# Patient Record
Sex: Female | Born: 1935
Health system: Southern US, Community
[De-identification: ages and names within clinical notes are randomized; demographics above are authoritative.]

## PROBLEM LIST (undated history)

## (undated) DIAGNOSIS — G8929 Other chronic pain: Secondary | ICD-10-CM

## (undated) DIAGNOSIS — R740 Nonspecific elevation of levels of transaminase and lactic acid dehydrogenase [LDH]: Secondary | ICD-10-CM

## (undated) DIAGNOSIS — K449 Diaphragmatic hernia without obstruction or gangrene: Secondary | ICD-10-CM

## (undated) DIAGNOSIS — I700 Atherosclerosis of aorta: Secondary | ICD-10-CM

## (undated) DIAGNOSIS — M1990 Unspecified osteoarthritis, unspecified site: Secondary | ICD-10-CM

## (undated) DIAGNOSIS — R918 Other nonspecific abnormal finding of lung field: Secondary | ICD-10-CM

## (undated) DIAGNOSIS — H409 Unspecified glaucoma: Secondary | ICD-10-CM

## (undated) DIAGNOSIS — R7401 Elevation of levels of liver transaminase levels: Secondary | ICD-10-CM

## (undated) DIAGNOSIS — I2510 Atherosclerotic heart disease of native coronary artery without angina pectoris: Secondary | ICD-10-CM

## (undated) DIAGNOSIS — M419 Scoliosis, unspecified: Secondary | ICD-10-CM

## (undated) DIAGNOSIS — N390 Urinary tract infection, site not specified: Secondary | ICD-10-CM

## (undated) DIAGNOSIS — I219 Acute myocardial infarction, unspecified: Secondary | ICD-10-CM

## (undated) DIAGNOSIS — K219 Gastro-esophageal reflux disease without esophagitis: Secondary | ICD-10-CM

## (undated) DIAGNOSIS — I5189 Other ill-defined heart diseases: Secondary | ICD-10-CM

## (undated) DIAGNOSIS — I10 Essential (primary) hypertension: Secondary | ICD-10-CM

## (undated) DIAGNOSIS — K5790 Diverticulosis of intestine, part unspecified, without perforation or abscess without bleeding: Secondary | ICD-10-CM

## (undated) HISTORY — DX: Other ill-defined heart diseases: I51.89

## (undated) HISTORY — PX: ABDOMINAL HYSTERECTOMY: SHX81

## (undated) HISTORY — PX: FRACTURE SURGERY: SHX138

## (undated) HISTORY — PX: EYE SURGERY: SHX253

---

## 2007-09-11 ENCOUNTER — Encounter: Admission: RE | Admit: 2007-09-11 | Discharge: 2007-11-07 | Payer: Self-pay | Admitting: Sports Medicine

## 2010-02-12 ENCOUNTER — Encounter: Payer: Self-pay | Admitting: Internal Medicine

## 2010-03-15 ENCOUNTER — Encounter: Payer: Self-pay | Admitting: Internal Medicine

## 2010-04-15 ENCOUNTER — Encounter: Payer: Self-pay | Admitting: Internal Medicine

## 2010-04-21 ENCOUNTER — Ambulatory Visit: Payer: Self-pay | Admitting: Internal Medicine

## 2014-05-27 ENCOUNTER — Inpatient Hospital Stay (HOSPITAL_COMMUNITY)
Admission: EM | Admit: 2014-05-27 | Discharge: 2014-06-01 | DRG: 758 | Disposition: A | Discharge status: Home Health | Payer: Medicare PPO | Attending: Family Medicine | Admitting: Family Medicine

## 2014-05-27 ENCOUNTER — Emergency Department (HOSPITAL_COMMUNITY): Payer: Medicare PPO

## 2014-05-27 ENCOUNTER — Encounter (HOSPITAL_COMMUNITY): Payer: Self-pay | Admitting: Emergency Medicine

## 2014-05-27 DIAGNOSIS — I10 Essential (primary) hypertension: Secondary | ICD-10-CM | POA: Diagnosis present

## 2014-05-27 DIAGNOSIS — K449 Diaphragmatic hernia without obstruction or gangrene: Secondary | ICD-10-CM | POA: Diagnosis present

## 2014-05-27 DIAGNOSIS — Z87891 Personal history of nicotine dependence: Secondary | ICD-10-CM | POA: Diagnosis not present

## 2014-05-27 DIAGNOSIS — Z8249 Family history of ischemic heart disease and other diseases of the circulatory system: Secondary | ICD-10-CM

## 2014-05-27 DIAGNOSIS — M419 Scoliosis, unspecified: Secondary | ICD-10-CM | POA: Diagnosis present

## 2014-05-27 DIAGNOSIS — N760 Acute vaginitis: Secondary | ICD-10-CM | POA: Diagnosis present

## 2014-05-27 DIAGNOSIS — K219 Gastro-esophageal reflux disease without esophagitis: Secondary | ICD-10-CM | POA: Diagnosis present

## 2014-05-27 DIAGNOSIS — Z880 Allergy status to penicillin: Secondary | ICD-10-CM

## 2014-05-27 DIAGNOSIS — R918 Other nonspecific abnormal finding of lung field: Secondary | ICD-10-CM | POA: Diagnosis present

## 2014-05-27 DIAGNOSIS — H409 Unspecified glaucoma: Secondary | ICD-10-CM | POA: Diagnosis present

## 2014-05-27 DIAGNOSIS — N730 Acute parametritis and pelvic cellulitis: Principal | ICD-10-CM | POA: Diagnosis present

## 2014-05-27 DIAGNOSIS — E860 Dehydration: Secondary | ICD-10-CM | POA: Diagnosis present

## 2014-05-27 DIAGNOSIS — M1990 Unspecified osteoarthritis, unspecified site: Secondary | ICD-10-CM | POA: Diagnosis present

## 2014-05-27 DIAGNOSIS — M810 Age-related osteoporosis without current pathological fracture: Secondary | ICD-10-CM | POA: Diagnosis present

## 2014-05-27 DIAGNOSIS — Z833 Family history of diabetes mellitus: Secondary | ICD-10-CM

## 2014-05-27 DIAGNOSIS — N390 Urinary tract infection, site not specified: Secondary | ICD-10-CM | POA: Diagnosis present

## 2014-05-27 DIAGNOSIS — T836XXA Infection and inflammatory reaction due to prosthetic device, implant and graft in genital tract, initial encounter: Secondary | ICD-10-CM

## 2014-05-27 DIAGNOSIS — Z800 Family history of malignant neoplasm of digestive organs: Secondary | ICD-10-CM | POA: Diagnosis not present

## 2014-05-27 DIAGNOSIS — Z79899 Other long term (current) drug therapy: Secondary | ICD-10-CM | POA: Diagnosis not present

## 2014-05-27 DIAGNOSIS — R197 Diarrhea, unspecified: Secondary | ICD-10-CM

## 2014-05-27 DIAGNOSIS — K529 Noninfective gastroenteritis and colitis, unspecified: Secondary | ICD-10-CM | POA: Diagnosis present

## 2014-05-27 DIAGNOSIS — F319 Bipolar disorder, unspecified: Secondary | ICD-10-CM | POA: Diagnosis present

## 2014-05-27 DIAGNOSIS — Z79891 Long term (current) use of opiate analgesic: Secondary | ICD-10-CM | POA: Diagnosis not present

## 2014-05-27 DIAGNOSIS — R112 Nausea with vomiting, unspecified: Secondary | ICD-10-CM | POA: Diagnosis present

## 2014-05-27 DIAGNOSIS — R52 Pain, unspecified: Secondary | ICD-10-CM

## 2014-05-27 DIAGNOSIS — T8369XA Infection and inflammatory reaction due to other prosthetic device, implant and graft in genital tract, initial encounter: Secondary | ICD-10-CM

## 2014-05-27 DIAGNOSIS — D72829 Elevated white blood cell count, unspecified: Secondary | ICD-10-CM | POA: Diagnosis present

## 2014-05-27 HISTORY — DX: Other nonspecific abnormal finding of lung field: R91.8

## 2014-05-27 HISTORY — DX: Unspecified glaucoma: H40.9

## 2014-05-27 HISTORY — DX: Gastro-esophageal reflux disease without esophagitis: K21.9

## 2014-05-27 HISTORY — DX: Unspecified osteoarthritis, unspecified site: M19.90

## 2014-05-27 HISTORY — DX: Atherosclerosis of aorta: I70.0

## 2014-05-27 HISTORY — DX: Diaphragmatic hernia without obstruction or gangrene: K44.9

## 2014-05-27 HISTORY — DX: Essential (primary) hypertension: I10

## 2014-05-27 HISTORY — DX: Scoliosis, unspecified: M41.9

## 2014-05-27 HISTORY — DX: Diverticulosis of intestine, part unspecified, without perforation or abscess without bleeding: K57.90

## 2014-05-27 LAB — CBC WITH DIFFERENTIAL/PLATELET
BASOS ABS: 0 10*3/uL (ref 0.0–0.1)
Basophils Relative: 0 % (ref 0–1)
EOS ABS: 0 10*3/uL (ref 0.0–0.7)
Eosinophils Relative: 0 % (ref 0–5)
HCT: 39.6 % (ref 36.0–46.0)
Hemoglobin: 12.5 g/dL (ref 12.0–15.0)
LYMPHS ABS: 0.8 10*3/uL (ref 0.7–4.0)
Lymphocytes Relative: 5 % — ABNORMAL LOW (ref 12–46)
MCH: 30.3 pg (ref 26.0–34.0)
MCHC: 31.6 g/dL (ref 30.0–36.0)
MCV: 96.1 fL (ref 78.0–100.0)
MONOS PCT: 4 % (ref 3–12)
Monocytes Absolute: 0.6 10*3/uL (ref 0.1–1.0)
Neutro Abs: 14.3 10*3/uL — ABNORMAL HIGH (ref 1.7–7.7)
Neutrophils Relative %: 91 % — ABNORMAL HIGH (ref 43–77)
Platelets: 357 10*3/uL (ref 150–400)
RBC: 4.12 MIL/uL (ref 3.87–5.11)
RDW: 13.5 % (ref 11.5–15.5)
WBC: 15.7 10*3/uL — AB (ref 4.0–10.5)

## 2014-05-27 LAB — COMPREHENSIVE METABOLIC PANEL
ALK PHOS: 73 U/L (ref 39–117)
ALT: 14 U/L (ref 0–35)
ANION GAP: 9 (ref 5–15)
AST: 19 U/L (ref 0–37)
Albumin: 3.4 g/dL — ABNORMAL LOW (ref 3.5–5.2)
BUN: 22 mg/dL (ref 6–23)
CHLORIDE: 107 mmol/L (ref 96–112)
CO2: 22 mmol/L (ref 19–32)
CREATININE: 0.8 mg/dL (ref 0.50–1.10)
Calcium: 8.7 mg/dL (ref 8.4–10.5)
GFR calc non Af Amer: 66 mL/min — ABNORMAL LOW (ref >90)
GFR, EST AFRICAN AMERICAN: 76 mL/min — AB (ref >90)
GLUCOSE: 136 mg/dL — AB (ref 70–99)
Potassium: 4.1 mmol/L (ref 3.5–5.1)
Sodium: 138 mmol/L (ref 135–145)
TOTAL PROTEIN: 6.5 g/dL (ref 6.0–8.3)
Total Bilirubin: 0.3 mg/dL (ref 0.3–1.2)

## 2014-05-27 LAB — URINE MICROSCOPIC-ADD ON

## 2014-05-27 LAB — URINALYSIS, ROUTINE W REFLEX MICROSCOPIC
Bilirubin Urine: NEGATIVE
Glucose, UA: NEGATIVE mg/dL
Ketones, ur: NEGATIVE mg/dL
Nitrite: NEGATIVE
PROTEIN: NEGATIVE mg/dL
Specific Gravity, Urine: 1.015 (ref 1.005–1.030)
UROBILINOGEN UA: 0.2 mg/dL (ref 0.0–1.0)
pH: 5.5 (ref 5.0–8.0)

## 2014-05-27 LAB — I-STAT CG4 LACTIC ACID, ED: Lactic Acid, Venous: 0.96 mmol/L (ref 0.5–2.0)

## 2014-05-27 MED ORDER — ONDANSETRON HCL 4 MG/2ML IJ SOLN
4.0000 mg | Freq: Three times a day (TID) | INTRAMUSCULAR | Status: AC | PRN
  Administered 2014-05-28: 4 mg via INTRAVENOUS
  Filled 2014-05-27: qty 2

## 2014-05-27 MED ORDER — ALPRAZOLAM 0.5 MG PO TABS
0.5000 mg | ORAL_TABLET | Freq: Two times a day (BID) | ORAL | Status: DC | PRN
  Administered 2014-05-28 – 2014-05-31 (×6): 0.5 mg via ORAL
  Filled 2014-05-27 (×6): qty 1

## 2014-05-27 MED ORDER — ACETAMINOPHEN 650 MG RE SUPP
650.0000 mg | Freq: Four times a day (QID) | RECTAL | Status: DC | PRN
Start: 2014-05-27 — End: 2014-06-01

## 2014-05-27 MED ORDER — HYDROMORPHONE HCL 1 MG/ML IJ SOLN
0.5000 mg | Freq: Once | INTRAMUSCULAR | Status: AC
  Administered 2014-05-27: 0.5 mg via INTRAVENOUS
  Filled 2014-05-27: qty 1

## 2014-05-27 MED ORDER — POLYVINYL ALCOHOL 1.4 % OP SOLN
1.0000 [drp] | Freq: Two times a day (BID) | OPHTHALMIC | Status: DC
  Administered 2014-05-27 – 2014-06-01 (×10): 1 [drp] via OPHTHALMIC
  Filled 2014-05-27: qty 15

## 2014-05-27 MED ORDER — MORPHINE SULFATE 4 MG/ML IJ SOLN
4.0000 mg | INTRAMUSCULAR | Status: DC | PRN
  Administered 2014-05-27 – 2014-05-29 (×7): 4 mg via INTRAVENOUS
  Administered 2014-05-29: 6 mg via INTRAVENOUS
  Administered 2014-05-29 – 2014-05-30 (×3): 4 mg via INTRAVENOUS
  Administered 2014-05-30: 6 mg via INTRAVENOUS
  Administered 2014-05-30 – 2014-06-01 (×6): 4 mg via INTRAVENOUS
  Filled 2014-05-27 (×9): qty 1
  Filled 2014-05-27: qty 2
  Filled 2014-05-27 (×5): qty 1
  Filled 2014-05-27: qty 2
  Filled 2014-05-27 (×2): qty 1

## 2014-05-27 MED ORDER — METRONIDAZOLE IN NACL 5-0.79 MG/ML-% IV SOLN
500.0000 mg | Freq: Three times a day (TID) | INTRAVENOUS | Status: DC
  Administered 2014-05-27 – 2014-05-29 (×5): 500 mg via INTRAVENOUS
  Filled 2014-05-27 (×6): qty 100

## 2014-05-27 MED ORDER — SODIUM CHLORIDE 0.9 % IV BOLUS (SEPSIS)
500.0000 mL | Freq: Once | INTRAVENOUS | Status: AC
  Administered 2014-05-27: 500 mL via INTRAVENOUS

## 2014-05-27 MED ORDER — PANTOPRAZOLE SODIUM 40 MG IV SOLR
40.0000 mg | INTRAVENOUS | Status: DC
  Administered 2014-05-27 – 2014-05-28 (×2): 40 mg via INTRAVENOUS
  Filled 2014-05-27 (×3): qty 40

## 2014-05-27 MED ORDER — CALCIUM CARBONATE-VITAMIN D 500-200 MG-UNIT PO TABS
1.0000 | ORAL_TABLET | Freq: Two times a day (BID) | ORAL | Status: DC
  Administered 2014-05-27 – 2014-06-01 (×10): 1 via ORAL
  Filled 2014-05-27 (×11): qty 1

## 2014-05-27 MED ORDER — ONDANSETRON HCL 4 MG/2ML IJ SOLN
4.0000 mg | Freq: Once | INTRAMUSCULAR | Status: AC
  Administered 2014-05-27: 4 mg via INTRAVENOUS
  Filled 2014-05-27: qty 2

## 2014-05-27 MED ORDER — PROMETHAZINE HCL 25 MG/ML IJ SOLN
12.5000 mg | INTRAMUSCULAR | Status: DC | PRN
  Administered 2014-05-28 – 2014-05-29 (×3): 12.5 mg via INTRAVENOUS
  Filled 2014-05-27 (×4): qty 1

## 2014-05-27 MED ORDER — CIPROFLOXACIN IN D5W 400 MG/200ML IV SOLN
400.0000 mg | Freq: Once | INTRAVENOUS | Status: AC
  Administered 2014-05-27: 400 mg via INTRAVENOUS
  Filled 2014-05-27: qty 200

## 2014-05-27 MED ORDER — LATANOPROST 0.005 % OP SOLN
1.0000 [drp] | Freq: Every day | OPHTHALMIC | Status: DC
  Administered 2014-05-27 – 2014-05-31 (×5): 1 [drp] via OPHTHALMIC
  Filled 2014-05-27: qty 2.5

## 2014-05-27 MED ORDER — SODIUM CHLORIDE 0.9 % IV SOLN
INTRAVENOUS | Status: DC
  Administered 2014-05-30 – 2014-05-31 (×2): via INTRAVENOUS

## 2014-05-27 MED ORDER — DOXYCYCLINE HYCLATE 100 MG IV SOLR
100.0000 mg | Freq: Two times a day (BID) | INTRAVENOUS | Status: DC
  Administered 2014-05-27 – 2014-05-29 (×4): 100 mg via INTRAVENOUS
  Filled 2014-05-27 (×4): qty 100

## 2014-05-27 MED ORDER — IOHEXOL 300 MG/ML  SOLN
50.0000 mL | Freq: Once | INTRAMUSCULAR | Status: AC | PRN
  Administered 2014-05-27: 50 mL via ORAL

## 2014-05-27 MED ORDER — IOHEXOL 300 MG/ML  SOLN
100.0000 mL | Freq: Once | INTRAMUSCULAR | Status: AC | PRN
  Administered 2014-05-27: 100 mL via INTRAVENOUS

## 2014-05-27 MED ORDER — POLYETHYL GLYCOL-PROPYL GLYCOL 0.4-0.3 % OP SOLN: 1.0000 [drp] | Freq: Two times a day (BID) | OPHTHALMIC | Status: DC

## 2014-05-27 MED ORDER — ACETAMINOPHEN 325 MG PO TABS: 650.0000 mg | ORAL_TABLET | Freq: Four times a day (QID) | ORAL | Status: DC | PRN

## 2014-05-27 MED ORDER — DORZOLAMIDE HCL 2 % OP SOLN
1.0000 [drp] | Freq: Two times a day (BID) | OPHTHALMIC | Status: DC
  Administered 2014-05-27 – 2014-06-01 (×10): 1 [drp] via OPHTHALMIC
  Filled 2014-05-27: qty 10

## 2014-05-27 MED ORDER — SODIUM CHLORIDE 0.9 % IV SOLN
INTRAVENOUS | Status: AC
  Administered 2014-05-27: 22:00:00 via INTRAVENOUS

## 2014-05-27 MED ORDER — CALCIUM CITRATE-VITAMIN D 250-100 MG-UNIT PO TABS: 1.0000 | ORAL_TABLET | Freq: Two times a day (BID) | ORAL | Status: DC

## 2014-05-27 MED ORDER — ENOXAPARIN SODIUM 40 MG/0.4ML ~~LOC~~ SOLN
40.0000 mg | SUBCUTANEOUS | Status: DC
  Administered 2014-05-27 – 2014-05-31 (×5): 40 mg via SUBCUTANEOUS
  Filled 2014-05-27 (×6): qty 0.4

## 2014-05-27 MED ORDER — PROMETHAZINE HCL 25 MG/ML IJ SOLN
12.5000 mg | Freq: Once | INTRAMUSCULAR | Status: AC
  Administered 2014-05-27: 12.5 mg via INTRAVENOUS
  Filled 2014-05-27: qty 1

## 2014-05-27 MED ORDER — CITALOPRAM HYDROBROMIDE 20 MG PO TABS
20.0000 mg | ORAL_TABLET | Freq: Every day | ORAL | Status: DC
  Administered 2014-05-28 – 2014-06-01 (×6): 20 mg via ORAL
  Filled 2014-05-27 (×5): qty 1

## 2014-05-27 MED ORDER — BRIMONIDINE TARTRATE 0.15 % OP SOLN
1.0000 [drp] | Freq: Two times a day (BID) | OPHTHALMIC | Status: DC
  Administered 2014-05-27 – 2014-06-01 (×10): 1 [drp] via OPHTHALMIC
  Filled 2014-05-27: qty 5

## 2014-05-27 NOTE — ED Notes (Signed)
Pt. Is unable to use the restroom at this time, but is aware that we need a urine specimen.  

## 2014-05-27 NOTE — ED Provider Notes (Signed)
CSN: [id number]     Arrival date & time 05/27/14  1120 History   First MD Initiated Contact with Patient 05/27/14 1150     Chief Complaint  Patient presents with  . Nausea  . Emesis     (Consider location/radiation/quality/duration/timing/severity/associated sxs/prior Treatment) Patient is a 81 y.o. female presenting with vomiting. The history is provided by the patient (the pt complains of diarhea and nausea for two days.  pt complains of weakness).  Emesis Severity:  Mild Timing:  Intermittent Quality:  Undigested food Able to tolerate:  Liquids Progression:  Unchanged Chronicity:  New Recent urination:  Decreased Relieved by:  Nothing Associated symptoms: abdominal pain and diarrhea   Associated symptoms: no headaches     Past Medical History  Diagnosis Date  . Hypertension   . Arthritis    Past Surgical History  Procedure Laterality Date  . Fracture surgery    . Abdominal hysterectomy    . Eye surgery     No family history on file. History  Substance Use Topics  . Smoking status: Never Smoker   . Smokeless tobacco: Not on file  . Alcohol Use: No   OB History    No data available     Review of Systems  Constitutional: Negative for appetite change and fatigue.  HENT: Negative for congestion, ear discharge and sinus pressure.   Eyes: Negative for discharge.  Respiratory: Negative for cough.   Cardiovascular: Negative for chest pain.  Gastrointestinal: Positive for nausea, vomiting, abdominal pain and diarrhea.  Genitourinary: Negative for frequency and hematuria.  Musculoskeletal: Negative for back pain.  Skin: Negative for rash.  Neurological: Negative for seizures and headaches.  Psychiatric/Behavioral: Negative for hallucinations.      Allergies  Penicillins  Home Medications   Prior to Admission medications   Medication Sig Start Date End Date Taking? Authorizing Provider  ALPRAZolam Prudy Feeler) 0.5 MG tablet Take 0.5 mg by mouth 2 (two) times  daily as needed for anxiety.   Yes Historical Provider, MD  bimatoprost (LUMIGAN) 0.03 % ophthalmic solution Place 1 drop into both eyes daily.   Yes Historical Provider, MD  brimonidine (ALPHAGAN P) 0.1 % SOLN Place 1 drop into both eyes 2 (two) times daily.   Yes Historical Provider, MD  calcium-vitamin D 250-100 MG-UNIT per tablet Take 1 tablet by mouth 2 (two) times daily.   Yes Historical Provider, MD  celecoxib (CELEBREX) 200 MG capsule Take 200 mg by mouth 2 (two) times daily.   Yes Historical Provider, MD  cetirizine (ZYRTEC) 10 MG tablet Take 10 mg by mouth daily.   Yes Historical Provider, MD  citalopram (CELEXA) 20 MG tablet Take 20 mg by mouth daily.   Yes Historical Provider, MD  docusate sodium (COLACE) 100 MG capsule Take 100 mg by mouth at bedtime.   Yes Historical Provider, MD  dorzolamide (TRUSOPT) 2 % ophthalmic solution Place 1 drop into both eyes 2 (two) times daily.   Yes Historical Provider, MD  lansoprazole (PREVACID) 30 MG capsule Take 30 mg by mouth daily at 12 noon.   Yes Historical Provider, MD  lisinopril (PRINIVIL,ZESTRIL) 20 MG tablet Take 20 mg by mouth daily.   Yes Historical Provider, MD  oxyCODONE (OXY IR/ROXICODONE) 5 MG immediate release tablet Take 5 mg by mouth 3 (three) times daily as needed (pain).   Yes Historical Provider, MD  Polyethyl Glycol-Propyl Glycol (SYSTANE OP) Place 1 drop into both eyes 2 (two) times daily.   Yes Historical Provider, MD  Vitamin  D, Cholecalciferol, 1000 UNITS TABS Take 1,000 Units by mouth daily.   Yes Historical Provider, MD   BP 104/54 mmHg  Pulse 81  Temp(Src) 98.7 F (37.1 C) (Oral)  Resp 17  SpO2 94% Physical Exam  Constitutional: She is oriented to person, place, and time. She appears well-developed.  HENT:  Head: Normocephalic.  Eyes: Conjunctivae and EOM are normal. No scleral icterus.  Neck: Neck supple. No thyromegaly present.  Cardiovascular: Normal rate and regular rhythm.  Exam reveals no gallop and no  friction rub.   No murmur heard. Pulmonary/Chest: No stridor. She has no wheezes. She has no rales. She exhibits no tenderness.  Abdominal: She exhibits no distension. There is tenderness. There is no rebound.  Musculoskeletal: Normal range of motion. She exhibits no edema.  Lymphadenopathy:    She has no cervical adenopathy.  Neurological: She is oriented to person, place, and time. She exhibits normal muscle tone. Coordination normal.  Skin: No rash noted. No erythema.  Psychiatric: She has a normal mood and affect. Her behavior is normal.    ED Course  Procedures (including critical care time) Labs Review Labs Reviewed  CBC WITH DIFFERENTIAL/PLATELET - Abnormal; Notable for the following:    WBC 15.7 (*)    Neutrophils Relative % 91 (*)    Neutro Abs 14.3 (*)    Lymphocytes Relative 5 (*)    All other components within normal limits  COMPREHENSIVE METABOLIC PANEL - Abnormal; Notable for the following:    Glucose, Bld 136 (*)    Albumin 3.4 (*)    GFR calc non Af Amer 66 (*)    GFR calc Af Amer 76 (*)    All other components within normal limits  URINALYSIS, ROUTINE W REFLEX MICROSCOPIC - Abnormal; Notable for the following:    APPearance CLOUDY (*)    Hgb urine dipstick SMALL (*)    Leukocytes, UA LARGE (*)    All other components within normal limits  URINE MICROSCOPIC-ADD ON - Abnormal; Notable for the following:    Squamous Epithelial / LPF FEW (*)    Bacteria, UA MANY (*)    All other components within normal limits  CLOSTRIDIUM DIFFICILE BY PCR  URINE CULTURE    Imaging Review No results found.   EKG Interpretation None      MDM   Final diagnoses:  Pain  Colitis        Bethann Berkshire, MD 05/28/14 [id number]

## 2014-05-27 NOTE — ED Notes (Signed)
[facility] cancelled 

## 2014-05-27 NOTE — ED Notes (Signed)
Pt had bed at [facility], MD Rama paged to verify placement, MD Rama wants pt to stay at [facility].

## 2014-05-27 NOTE — ED Provider Notes (Signed)
D/w hospitalist Dr. Darnelle Catalan who will admit  Alexis Hong, MD 05/27/14 [phone]

## 2014-05-27 NOTE — ED Notes (Signed)
Bed: WA02 Expected date:  Expected time:  Means of arrival:  Comments: EMS 

## 2014-05-27 NOTE — ED Notes (Addendum)
Per EMS, Pt from home c/o N/V x 2 days. Denies abdominal pain. A&Ox4. Pt ambulatory with assistance. Pt received 950 mL NS and 4 mg zofran en route.

## 2014-05-27 NOTE — H&P (Addendum)
History and Physical:    Alexis Yates:[id number] DOB: 03/24/1927 DOA: 05/27/2014  Referring physician: Bethann Berkshire, MD PCP: Pearla Dubonnet, MD  Urologist: Tobie Lords  Chief Complaint: Nausea/vomiting  History of Present Illness:   Alexis Yates is an 81 y.o. female with HTN, OA on chronic pain medication who presents with a 2-3 day history of nausea and vomiting as well as diarrhea accompanied by diffuse abdominal aching pain.  Had a Z pack in December and February.  The patient has a pessary and reports that she gets in changed twice a year, and that she has had a foul smelling vaginal discharge, and has had infections in the past.  She sees Dr. Sabino Gasser in [facility] for pessary changes, and reports that she is "due" to have this done.  She specifically denies any dysuria, hematuria or urinary symptoms.  The EDP felt she had a UTI due to pyuria and bacteria on microscopy, but she has frank purulent vaginal drainage, so suspect this is a contaminant.    ROS:   Constitutional: No fever, + chills;  Appetite diminished; No weight loss, no weight gain, + fatigue.  HEENT: No blurry vision, no diplopia, no pharyngitis, no dysphagia CV: No chest pain, no palpitations, no PND, no orthopnea, no edema.  Resp: No SOB, no cough, no pleuritic pain. GI: + nausea, + vomiting, +diarrhea, no melena, no hematochezia, no constipation, + abdominal pain.  GU: No dysuria, no hematuria, no frequency, no urgency. MSK: no myalgias, + chronic arthralgias.  Neuro:  No headache, no focal neurological deficits, no history of seizures.  Psych: No depression, no anxiety.  Endo: No heat intolerance, no cold intolerance, no polyuria, no polydipsia  Skin: No rashes, no skin lesions.  Heme: + easy bruising.  Travel history: No recent travel.   Past Medical History:   Past Medical History  Diagnosis Date  . Hypertension   . Arthritis   . Hiatal hernia   . Diverticulosis   . Lung nodules     Right    . Atherosclerosis of aorta   . Scoliosis   . Glaucoma   . GERD (gastroesophageal reflux disease)     Past Surgical History:   Past Surgical History  Procedure Laterality Date  . Fracture surgery      Tibia/fibula of right leg  . Abdominal hysterectomy    . Eye surgery      Social History:   History   Social History  . Marital Status: Widowed    Spouse Name: N/A  . Number of Children: 2  . Years of Education: N/A   Occupational History  . Retired from school system    Social History Main Topics  . Smoking status: Former Games developer  . Smokeless tobacco: Not on file  . Alcohol Use: No  . Drug Use: No  . Sexual Activity: Not on file   Other Topics Concern  . Not on file   Social History Narrative   Widowed.  Lives alone.  Ambulates with a cane.    Family history:   Family History  Problem Relation Age of Onset  . Cancer Brother     Stomach  . Heart disease Father     Died age 6  . Diabetes Mother   . Heart failure Mother     Died 48    Allergies   Penicillins  Current Medications:   Prior to Admission medications   Medication Sig Start Date End Date Taking? Authorizing  Provider  ALPRAZolam Prudy Feeler) 0.5 MG tablet Take 0.5 mg by mouth 2 (two) times daily as needed for anxiety.   Yes Historical Provider, MD  bimatoprost (LUMIGAN) 0.03 % ophthalmic solution Place 1 drop into both eyes daily.   Yes Historical Provider, MD  brimonidine (ALPHAGAN P) 0.1 % SOLN Place 1 drop into both eyes 2 (two) times daily.   Yes Historical Provider, MD  calcium-vitamin D 250-100 MG-UNIT per tablet Take 1 tablet by mouth 2 (two) times daily.   Yes Historical Provider, MD  celecoxib (CELEBREX) 200 MG capsule Take 200 mg by mouth 2 (two) times daily.   Yes Historical Provider, MD  cetirizine (ZYRTEC) 10 MG tablet Take 10 mg by mouth daily.   Yes Historical Provider, MD  citalopram (CELEXA) 20 MG tablet Take 20 mg by mouth daily.   Yes Historical Provider, MD  docusate sodium  (COLACE) 100 MG capsule Take 100 mg by mouth at bedtime.   Yes Historical Provider, MD  dorzolamide (TRUSOPT) 2 % ophthalmic solution Place 1 drop into both eyes 2 (two) times daily.   Yes Historical Provider, MD  lansoprazole (PREVACID) 30 MG capsule Take 30 mg by mouth daily at 12 noon.   Yes Historical Provider, MD  lisinopril (PRINIVIL,ZESTRIL) 20 MG tablet Take 20 mg by mouth daily.   Yes Historical Provider, MD  oxyCODONE (OXY IR/ROXICODONE) 5 MG immediate release tablet Take 5 mg by mouth 3 (three) times daily as needed (pain).   Yes Historical Provider, MD  Polyethyl Glycol-Propyl Glycol (SYSTANE OP) Place 1 drop into both eyes 2 (two) times daily.   Yes Historical Provider, MD  Vitamin D, Cholecalciferol, 1000 UNITS TABS Take 1,000 Units by mouth daily.   Yes Historical Provider, MD    Physical Exam:   Filed Vitals:   05/27/14 1129 05/27/14 1341 05/27/14 1625 05/27/14 1800  BP: 135/77 104/54 93/60 119/64  Pulse: 97 81 77 84  Temp: 98.7 F (37.1 C)  98.4 F (36.9 C)   TempSrc: Oral  Oral   Resp: 15 17 16 15   SpO2: 100% 94% 93% 96%     Physical Exam: Blood pressure 119/64, pulse 84, temperature 98.4 F (36.9 C), temperature source Oral, resp. rate 15, SpO2 96 %. Gen: No acute distress. Head: Normocephalic, atraumatic. Eyes: PERRL, EOMI, sclerae nonicteric. Mouth: Oropharynx clear.  Dry mucous membranes. Neck: Supple, no thyromegaly, no lymphadenopathy, no jugular venous distention. Chest: Lungs CTAB. CV: Heart sounds tachycardic with II/VI SEM. Abdomen: Soft, tender lower abdomen, nondistended with normal active bowel sounds. GU: Vaginal atrophy, foul smelling purulent vaginal drainage. Extremities: Extremities without C/E/C. Skin: Warm and dry. Neuro: Alert and oriented times 3; cranial nerves II through XII grossly intact. Psych: Mood and affect normal.   Data Review:    Labs: Basic Metabolic Panel:  Recent Labs Lab 05/27/14 1210  NA 138  K 4.1  CL 107    CO2 22  GLUCOSE 136*  BUN 22  CREATININE 0.80  CALCIUM 8.7   Liver Function Tests:  Recent Labs Lab 05/27/14 1210  AST 19  ALT 14  ALKPHOS 73  BILITOT 0.3  PROT 6.5  ALBUMIN 3.4*   CBC:  Recent Labs Lab 05/27/14 1210  WBC 15.7*  NEUTROABS 14.3*  HGB 12.5  HCT 39.6  MCV 96.1  PLT 357   Urinalysis    Component Value Date/Time   COLORURINE YELLOW 05/27/2014 1122   APPEARANCEUR CLOUDY* 05/27/2014 1122   LABSPEC 1.015 05/27/2014 1122   PHURINE 5.5 05/27/2014  1122   GLUCOSEU NEGATIVE 05/27/2014 1122   HGBUR SMALL* 05/27/2014 1122   BILIRUBINUR NEGATIVE 05/27/2014 1122   KETONESUR NEGATIVE 05/27/2014 1122   PROTEINUR NEGATIVE 05/27/2014 1122   UROBILINOGEN 0.2 05/27/2014 1122   NITRITE NEGATIVE 05/27/2014 1122   LEUKOCYTESUR LARGE* 05/27/2014 1122   Radiographic Studies: Ct Abdomen Pelvis W Contrast  05/27/2014   CLINICAL DATA:  Nausea and vomiting for 2 days.  EXAM: CT ABDOMEN AND PELVIS WITH CONTRAST  TECHNIQUE: Multidetector CT imaging of the abdomen and pelvis was performed using the standard protocol following bolus administration of intravenous contrast.  CONTRAST:  OMNIPAQUE IOHEXOL 300 MG/ML SOLN, 50mL OMNIPAQUE IOHEXOL 300 MG/ML SOLN  COMPARISON:  None  FINDINGS: There is a 4 mm subpleural nodule in the basilar right lower lobe (series 6, image 7). There is a 3 mm ground-glass attenuation nodule in the right middle lobe (series 6, image 4). Minimal subsegmental atelectasis is present in the lung bases. There is no pleural effusion. Coronary artery calcification is partially visualized. Mitral annular calcification is also noted.  The liver, gallbladder, spleen, adrenal glands, and kidneys have an unremarkable enhanced appearance. There is mild intrahepatic and extrahepatic biliary dilatation,, with the common bile duct up to 10 mm in diameter. There is also moderate dilatation of the pancreatic duct, which measures up to 9 mm in the pancreatic head. No  definite obstructing mass is identified in this region.  There is a small sliding hiatal hernia. Oral contrast is present in loops of nondilated small and large bowel to the level of the rectum without evidence of obstruction. There is diffuse diverticulosis of the descending and sigmoid colon. The appearance of mild wall thickening involving the sigmoid colon is favored to be secondary to underdistention, without pericolonic inflammatory changes seen to definitely indicate acute diverticulitis. The appendix is identified in the right lower quadrant and is unremarkable.  There is advanced aortoiliac atherosclerotic calcification. No free fluid or enlarged lymph nodes are identified. The uterus is absent. Pelvic floor prolapse is noted with a pessary in place. Calcified granulomata are noted in the gluteal regions. Thoracolumbar dextroscoliosis is partially visualized. Severe right-sided disc space narrowing is present at L4-5.  IMPRESSION: 1. No definite acute abnormality identified in the abdomen or pelvis. 2. Small sliding hiatal hernia. 3. Mild biliary and moderate pancreatic ductal dilatation. No definite mass is identified on this study, however a small obstructing ampullary mass or stricture is possible. Consider ERCP or MRCP for further evaluation. 4. Diverticulosis without evidence of diverticulitis. 5. Small right lung nodules. If the patient is at high risk for bronchogenic carcinoma, follow-up chest CT at 1 year is recommended. If the patient is at low risk, no follow-up is needed. This recommendation follows the consensus statement: Guidelines for Management of Small Pulmonary Nodules Detected on CT Scans: A Statement from the Fleischner Society as published in Radiology 2005; 237:395-400.   Electronically Signed   By: Sebastian Ache   On: 05/27/2014 16:23     Assessment/Plan:   Principal Problem:     Vaginal inflammation from pessary R/O PID (acute pelvic inflammatory disease) / Leukocytosis  -  Suspect abnormalities on urine microscopy are from contamination with vaginal secretions, which are clearly purulent. - D/C Cipro.  Start doxycycline and Flagyl to cover PID. - Patient requests Dr. Marlou Porch be consulted (not on call), will call in a.m.  Pessary will need to be removed. - Anti-emetics/pain medications PRN. - F/U urine culture. - Lactic acid WNL.  Active Problems:  Nausea, vomiting and diarrhea - Anti-emetics PRN. - CL diet. - R/O C. Diff given recent treatment with Azithromycin.    Hypertension - Hold Prinvil.    Hiatal hernia /  GERD (gastroesophageal reflux disease) - PPI ordered IV.    Lung nodules - F/U CT chest in 1 year.  Patient counseled about need for follow up given smoking history (quit in 1983).    Glaucoma - Continue home medications.    DVT prophylaxis - Lovenox ordered.  Code Status: Full. Family Communication: Salina April ([phone]) or Jonetta Osgood [phone]), daughters. Disposition Plan: Home when stable.  Time spent: 1 hour.  RAMA,CHRISTINA [facility] Pager [phone] Cell: [phone]   If 7PM-7AM, please contact night-coverage [email or web address] Password [email or web address] 05/27/2014, 6:34 PM

## 2014-05-27 NOTE — ED Notes (Signed)
[facility] called  

## 2014-05-27 NOTE — ED Notes (Signed)
Attempted to call report, RN unavailable at this time. Floor RN to call back. 

## 2014-05-27 NOTE — Progress Notes (Signed)
EDCM spoke to patient and her family at bedside.  Patient's family member confirm patient's pcp is Dr. Johnella Moloney of [facility] [place].  System updated.

## 2014-05-28 DIAGNOSIS — I10 Essential (primary) hypertension: Secondary | ICD-10-CM

## 2014-05-28 DIAGNOSIS — N390 Urinary tract infection, site not specified: Secondary | ICD-10-CM

## 2014-05-28 DIAGNOSIS — K449 Diaphragmatic hernia without obstruction or gangrene: Secondary | ICD-10-CM

## 2014-05-28 DIAGNOSIS — H409 Unspecified glaucoma: Secondary | ICD-10-CM

## 2014-05-28 LAB — CBC
HEMATOCRIT: 35.8 % — AB (ref 36.0–46.0)
Hemoglobin: 11.3 g/dL — ABNORMAL LOW (ref 12.0–15.0)
MCH: 30.4 pg (ref 26.0–34.0)
MCHC: 31.6 g/dL (ref 30.0–36.0)
MCV: 96.2 fL (ref 78.0–100.0)
Platelets: 340 10*3/uL (ref 150–400)
RBC: 3.72 MIL/uL — ABNORMAL LOW (ref 3.87–5.11)
RDW: 13.9 % (ref 11.5–15.5)
WBC: 8.1 10*3/uL (ref 4.0–10.5)

## 2014-05-28 LAB — BASIC METABOLIC PANEL
Anion gap: 7 (ref 5–15)
BUN: 15 mg/dL (ref 6–23)
CALCIUM: 8.5 mg/dL (ref 8.4–10.5)
CHLORIDE: 109 mmol/L (ref 96–112)
CO2: 22 mmol/L (ref 19–32)
Creatinine, Ser: 0.86 mg/dL (ref 0.50–1.10)
GFR calc Af Amer: 70 mL/min — ABNORMAL LOW (ref >90)
GFR calc non Af Amer: 60 mL/min — ABNORMAL LOW (ref >90)
Glucose, Bld: 110 mg/dL — ABNORMAL HIGH (ref 70–99)
Potassium: 4.1 mmol/L (ref 3.5–5.1)
SODIUM: 138 mmol/L (ref 135–145)

## 2014-05-28 LAB — CLOSTRIDIUM DIFFICILE BY PCR: Toxigenic C. Difficile by PCR: NEGATIVE

## 2014-05-28 MED ORDER — LACTINEX PO CHEW
1.0000 | CHEWABLE_TABLET | Freq: Three times a day (TID) | ORAL | Status: DC
  Administered 2014-05-29 – 2014-06-01 (×10): 1 via ORAL
  Filled 2014-05-28 (×17): qty 1

## 2014-05-28 MED ORDER — SIMETHICONE 80 MG PO CHEW
160.0000 mg | CHEWABLE_TABLET | Freq: Four times a day (QID) | ORAL | Status: DC | PRN
  Administered 2014-05-28: 160 mg via ORAL
  Filled 2014-05-28 (×5): qty 2

## 2014-05-28 MED ORDER — LOPERAMIDE HCL 2 MG PO CAPS
2.0000 mg | ORAL_CAPSULE | ORAL | Status: DC | PRN
  Administered 2014-05-28 (×2): 2 mg via ORAL
  Filled 2014-05-28 (×3): qty 1

## 2014-05-28 MED ORDER — SACCHAROMYCES BOULARDII 250 MG PO CAPS
250.0000 mg | ORAL_CAPSULE | Freq: Two times a day (BID) | ORAL | Status: DC
  Filled 2014-05-28: qty 1

## 2014-05-28 NOTE — Progress Notes (Signed)
CARE MANAGEMENT NOTE 05/28/2014  Patient:  Alexis Yates, Alexis Yates   Account Number:  [id number]  Date Initiated:  05/28/2014  Documentation initiated by:  Ferdinand Cava  Subjective/Objective Assessment:   81 yo female admitted with acute pelvic inflammatory disease from home     Action/Plan:   discharge planning   Anticipated DC Date:  05/31/2014   Anticipated DC Plan:    In-house referral  Clinical Social Worker      DC Planning Services  CM consult      Choice offered to / List presented to:             Status of service:  In process, will continue to follow Medicare Important Message given?   (If response is "NO", the following Medicare IM given date fields will be blank) Date Medicare IM given:   Medicare IM given by:   Date Additional Medicare IM given:   Additional Medicare IM given by:    Discharge Disposition:    Per UR Regulation:    If discussed at Long Length of Stay Meetings, dates discussed:    Comments:  05/28/14 Ferdinand Cava RN BSN CM [phone] Spoke with patient and daughter in the room. Patient lives home alone and has a walker, cane, shower chair, elevated toilet seat, with the support of her daughter. The patient stated that she has not HH services in the past but she has been to 2 different facilities in the past for rehab, 1 in [place], and also [facility]. The patient stated that she is interested in rehab if recommended. The patient and daughter state that the patient will not go to [facility] and wants to remain in the [place] area. Will continue to follow on PT's recommendations, CSW made aware.

## 2014-05-28 NOTE — Progress Notes (Addendum)
[facility]                                                                              Patient Demographics  Alexis Yates, is a 81 y.o. female, DOB - 11-07-1927, YHO:[id number]  Admit date - 05/27/2014   Admitting Physician Maryruth Bun Rama, MD  Outpatient Primary MD for the patient is Pearla Dubonnet, MD  LOS - 1   Chief Complaint  Patient presents with  . Nausea  . Emesis      Interim history 81 year old female with history of hypertension, chronic pain, presented to the emergency department with complaints of nausea and vomiting with abdominal pain and diarrhea. Patient states she has had a history of pessary which she has changed twice a year however since having the pessary she has had vaginal discharge which has been smelly. Patient was admitted with vaginal inflammation from the pessary to rule out PID, was placed on doxycycline as well as Flagyl. Urology called for further evaluation and intervention.  Assessment & Plan   Vaginal Discharge/Inflammation -patient has a pessary in place -Urology consulted and appreciated, pending consult -Continue flagyl and doxycycline (patient has PCN allergy anaphylaxis, would also avoid cephalosporins) -CT abdomen/pelvis: No definite acute abnormality, small hiatal hernia, pancreatic ductal dilatation, no definite mass, diverticulosis, small right lung nodules -Unlikely PID as patient has no abscess noted on CT scan  UTI -UA: WBC TNTC, large leukocytes and many bacteria -Likely complicated by pessary -Urine culture pending  Abdominal pain with diarrhea and nausea/vomiting -Possibly secondary to the above -Continue anti-emetics and pain control as needed -C. difficile was negative -Will start patient on Imodium as needed -Patient does feel somewhat hungry, will advance her diet as tolerated.  Leukocytosis -Secondary to the above, resolved -Continue to monitor CBC  Essential Hypertension -Stable, Lisinopril  currently held  Lung nodules -Noted on CT scan -Patient quit smoking in 1983 -Patient will need to follow-up for repeat CT chest in one year  Glaucoma -Continue home regimen and eyedrops  Hiatal hernia/GERD -Continue PPI  Obstructing ampullary mass or stricture -Noted on CT of the abdomen and pelvis, no definite mass identified -Mild biliary and moderate pancreatic ductal dilatation -LFTs, bilirubin normal -If abdominal pain persists, would consider ERCP or MRCP for further evaluation vs outpatient work up   Code Status: Full  Family Communication: Daughter at bedside  Disposition Plan: Admitted, pending urology evaluation  Time Spent in minutes   30 minutes  Procedures  None  Consults   Urology  DVT Prophylaxis  Lovenox  Lab Results  Component Value Date   PLT 340 05/28/2014    Medications  Scheduled Meds: . brimonidine  1 drop Both Eyes BID  . calcium-vitamin D  1 tablet Oral BID  . citalopram  20 mg Oral Daily  . dorzolamide  1 drop Both Eyes BID  . doxycycline (VIBRAMYCIN) IV  100 mg Intravenous Q12H  . enoxaparin (LOVENOX) injection  40 mg Subcutaneous Q24H  . latanoprost  1 drop Both Eyes QHS  . metronidazole  500 mg Intravenous Q8H  . pantoprazole (PROTONIX) IV  40 mg Intravenous Q24H  . polyvinyl alcohol  1 drop Both Eyes BID   Continuous  Infusions: . sodium chloride     PRN Meds:.acetaminophen **OR** acetaminophen, ALPRAZolam, morphine injection, promethazine, simethicone  Antibiotics    Anti-infectives    Start     Dose/Rate Route Frequency Ordered Stop   05/27/14 1845  doxycycline (VIBRAMYCIN) 100 mg in dextrose 5 % 250 mL IVPB     100 mg 125 mL/hr over 120 Minutes Intravenous Every 12 hours 05/27/14 1830     05/27/14 1830  metroNIDAZOLE (FLAGYL) IVPB 500 mg     500 mg 100 mL/hr over 60 Minutes Intravenous Every 8 hours 05/27/14 1820     05/27/14 1600  ciprofloxacin (CIPRO) IVPB 400 mg     400 mg 200 mL/hr over 60 Minutes Intravenous   Once 05/27/14 1545 05/27/14 1837        Subjective:   Alexis Yates seen and examined today.  Patient continues to complain of abdominal pain with diarrhea. Feels that her nausea has improved slightly and is ready TE. Patient states that she has had no chest pain or shortness of breath. She does have some abdominal soreness especially with movement.   Objective:   Filed Vitals:   05/27/14 1800 05/27/14 1959 05/27/14 2121 05/28/14 0532  BP: 119/64 107/55 113/65 110/66  Pulse: 84 94 85 79  Temp:   98.1 F (36.7 C) 97.4 F (36.3 C)  TempSrc:   Oral Oral  Resp: Height:   5' (1.524 m)   Weight:   52.844 kg (116 lb 8 oz)   SpO2: 96% 92% 96% 95%    Wt Readings from Last 3 Encounters:  05/27/14 52.844 kg (116 lb 8 oz)     Intake/Output Summary (Last 24 hours) at 05/28/14 1302 Last data filed at 05/28/14 0900  Gross per 24 hour  Intake 1308.75 ml  Output      0 ml  Net 1308.75 ml    Exam  General: Well developed, well nourished, NAD, appears stated age  HEENT: NCAT, mucous membranes moist.   Cardiovascular: S1 S2 auscultated, RRR  Respiratory: Clear to auscultation bilaterally with equal chest rise  Abdomen: Soft, RLQ tenderness, nondistended, + bowel sounds  Extremities: warm dry without cyanosis clubbing or edema  Neuro: AAOx3, nonfocal  Psych: Normal affect and demeanor     Data Review   Micro Results Recent Results (from the past 240 hour(s))  Clostridium Difficile by PCR     Status: None   Collection Time: 05/28/14  8:06 AM  Result Value Ref Range Status   C difficile by pcr NEGATIVE NEGATIVE Final    Radiology Reports Ct Abdomen Pelvis W Contrast  05/27/2014   CLINICAL DATA:  Nausea and vomiting for 2 days.  EXAM: CT ABDOMEN AND PELVIS WITH CONTRAST  TECHNIQUE: Multidetector CT imaging of the abdomen and pelvis was performed using the standard protocol following bolus administration of intravenous contrast.  CONTRAST:  OMNIPAQUE  IOHEXOL 300 MG/ML SOLN, 50mL OMNIPAQUE IOHEXOL 300 MG/ML SOLN  COMPARISON:  None  FINDINGS: There is a 4 mm subpleural nodule in the basilar right lower lobe (series 6, image 7). There is a 3 mm ground-glass attenuation nodule in the right middle lobe (series 6, image 4). Minimal subsegmental atelectasis is present in the lung bases. There is no pleural effusion. Coronary artery calcification is partially visualized. Mitral annular calcification is also noted.  The liver, gallbladder, spleen, adrenal glands, and kidneys have an unremarkable enhanced appearance. There is mild intrahepatic and extrahepatic biliary dilatation,, with the common bile  duct up to 10 mm in diameter. There is also moderate dilatation of the pancreatic duct, which measures up to 9 mm in the pancreatic head. No definite obstructing mass is identified in this region.  There is a small sliding hiatal hernia. Oral contrast is present in loops of nondilated small and large bowel to the level of the rectum without evidence of obstruction. There is diffuse diverticulosis of the descending and sigmoid colon. The appearance of mild wall thickening involving the sigmoid colon is favored to be secondary to underdistention, without pericolonic inflammatory changes seen to definitely indicate acute diverticulitis. The appendix is identified in the right lower quadrant and is unremarkable.  There is advanced aortoiliac atherosclerotic calcification. No free fluid or enlarged lymph nodes are identified. The uterus is absent. Pelvic floor prolapse is noted with a pessary in place. Calcified granulomata are noted in the gluteal regions. Thoracolumbar dextroscoliosis is partially visualized. Severe right-sided disc space narrowing is present at L4-5.  IMPRESSION: 1. No definite acute abnormality identified in the abdomen or pelvis. 2. Small sliding hiatal hernia. 3. Mild biliary and moderate pancreatic ductal dilatation. No definite mass is identified on this  study, however a small obstructing ampullary mass or stricture is possible. Consider ERCP or MRCP for further evaluation. 4. Diverticulosis without evidence of diverticulitis. 5. Small right lung nodules. If the patient is at high risk for bronchogenic carcinoma, follow-up chest CT at 1 year is recommended. If the patient is at low risk, no follow-up is needed. This recommendation follows the consensus statement: Guidelines for Management of Small Pulmonary Nodules Detected on CT Scans: A Statement from the Fleischner Society as published in Radiology 2005; 237:395-400.   Electronically Signed   By: Sebastian Ache   On: 05/27/2014 16:23    CBC  Recent Labs Lab 05/27/14 1210 05/28/14 0540  WBC 15.7* 8.1  HGB 12.5 11.3*  HCT 39.6 35.8*  PLT 357 340  MCV 96.1 96.2  MCH 30.3 30.4  MCHC 31.6 31.6  RDW 13.5 13.9  LYMPHSABS 0.8  --   MONOABS 0.6  --   EOSABS 0.0  --   BASOSABS 0.0  --     Chemistries   Recent Labs Lab 05/27/14 1210 05/28/14 0540  NA 138 138  K 4.1 4.1  CL 107 109  CO2 22 22  GLUCOSE 136* 110*  BUN 22 15  CREATININE 0.80 0.86  CALCIUM 8.7 8.5  AST 19  --   ALT 14  --   ALKPHOS 73  --   BILITOT 0.3  --    ------------------------------------------------------------------------------------------------------------------ estimated creatinine clearance is 35 mL/min (by C-G formula based on Cr of 0.86). ------------------------------------------------------------------------------------------------------------------ No results for input(s): HGBA1C in the last 72 hours. ------------------------------------------------------------------------------------------------------------------ No results for input(s): CHOL, HDL, LDLCALC, TRIG, CHOLHDL, LDLDIRECT in the last 72 hours. ------------------------------------------------------------------------------------------------------------------ No results for input(s): TSH, T4TOTAL, T3FREE, THYROIDAB in the last 72  hours.  Invalid input(s): FREET3 ------------------------------------------------------------------------------------------------------------------ No results for input(s): VITAMINB12, FOLATE, FERRITIN, TIBC, IRON, RETICCTPCT in the last 72 hours.  Coagulation profile No results for input(s): INR, PROTIME in the last 168 hours.  No results for input(s): DDIMER in the last 72 hours.  Cardiac Enzymes No results for input(s): CKMB, TROPONINI, MYOGLOBIN in the last 168 hours.  Invalid input(s): CK ------------------------------------------------------------------------------------------------------------------ Invalid input(s): POCBNP    Leda Bellefeuille D.O. on 05/28/2014 at 1:02 PM  Between 7am to 7pm - Pager - [phone]  After 7pm go to [email or web address] - password [email or web address]  And look for the  night coverage person covering for me after hours  [facility] Office  [phone]

## 2014-05-28 NOTE — Progress Notes (Signed)
Clinical Social Work Department CLINICAL SOCIAL WORK PLACEMENT NOTE 05/28/2014  Patient:  Alexis Yates, Alexis Yates  Account Number:  [id number] Admit date:  05/27/2014  Clinical Social Worker:  Unk Lightning, LCSW  Date/time:  05/28/2014 02:30 PM  Clinical Social Work is seeking post-discharge placement for this patient at the following level of care:   SKILLED NURSING   (*CSW will update this form in Epic as items are completed)   05/28/2014  Patient/family provided with [facility] Department of Clinical Social Work's list of facilities offering this level of care within the geographic area requested by the patient (or if unable, by the patient's family).  05/28/2014  Patient/family informed of their freedom to choose among providers that offer the needed level of care, that participate in Medicare, Medicaid or managed care program needed by the patient, have an available bed and are willing to accept the patient.  05/28/2014  Patient/family informed of [facility]' ownership interest in [facility], as well as of the fact that they are under no obligation to receive care at this facility.  PASARR submitted to EDS on existing # PASARR number received on   FL2 transmitted to all facilities in geographic area requested by pt/family on  05/28/2014 FL2 transmitted to all facilities within larger geographic area on   Patient informed that his/her managed care company has contracts with or will negotiate with  certain facilities, including the following:     Patient/family informed of bed offers received:   Patient chooses bed at  Physician recommends and patient chooses bed at    Patient to be transferred to  on   Patient to be transferred to facility by  Patient and family notified of transfer on  Name of family member notified:    The following physician request were entered in Epic:   Additional Comments:

## 2014-05-28 NOTE — Progress Notes (Signed)
UR complete 

## 2014-05-28 NOTE — Progress Notes (Signed)
Clinical Social Work Department BRIEF PSYCHOSOCIAL ASSESSMENT 05/28/2014  Patient:  Alexis Yates, Alexis Yates     Account Number:  [id number]     [place] date:  05/27/2014  Clinical Social Worker:  Earlie Server  Date/Time:  05/28/2014 02:30 PM  Referred by:  Care Management  Date Referred:  05/28/2014 Referred for  SNF Placement   Other Referral:   Interview type:  Patient Other interview type:    PSYCHOSOCIAL DATA Living Status:  ALONE Admitted from facility:   Level of care:   Primary support name:  Christy Primary support relationship to patient:  CHILD, ADULT Degree of support available:   Strong    CURRENT CONCERNS Current Concerns  Post-Acute Placement   Other Concerns:    SOCIAL WORK ASSESSMENT / PLAN CSW received referral from CM reporting that when she spoke with patient and family that they were possibly interested in SNF placement. CSW reviewed chart and spoke with MD re: ordering PT evaluation.    CSW met with patient and dtr at bedside. CSW introduced myself and explained role. Patient reports she has been feeling weak and not doing well. Patient has been to [facility] and [facility] in the past and is aware of process. CSW provided SNF list and explained that insurance approval is needed prior to admitting to SNF. CSW explained that insurance authorization can take a couple of days so encouraged patient to allow SNF search in case placement is needed. Patient and family aware that SNF will need to be recommended and agreeable to [facility] and [facility] search.    CSW completed FL2 and faxed out. CSW will follow up with bed offers and to assist if SNF is recommended.   Assessment/plan status:  Psychosocial Support/Ongoing Assessment of Needs Other assessment/ plan:   Information/referral to community resources:   SNF list    PATIENT'S/FAMILY'S RESPONSE TO PLAN OF CARE: Patient alert and oriented. Patient and dtr engaged in assessment and reports  that patient has been doing fairly well at home but that she has become weak lately. Patient reports she enjoyed rehab at [facility] in the past and if PT recommends SNF then she would be agreeable to return for ST SNF. Patient reports she has fallen in the past and is concerned that she would fall again if she returned home. Patient aware that insurance authorization is needed and agreeable for CSW to continue to follow. Patient's dtr involved and reports that she and patient are agreeable to any recommendations provided by MD to ensure patient's safety.      [place], [place] [phone]

## 2014-05-28 NOTE — Consult Note (Signed)
I have been asked to see the patient by Dr. Edsel Petrin, MD, for evaluation and management of cystocele with pessary.  History of present illness: 13F who presented to the ED with diarrhea, nausea, vomitting and dehydration with a normal CT scan with PMH significant for cystocele managed with a pessary.  PAtient has had pessary for ~13yrs and initially it was changed every 3 months, but recently has been cleaned and replaced every 6 months.  She has also been started on vaginal estrogen cream.  Prior to pessary placement she had severe bladder prolapse leading to significant bladder pain and pelvic pressure. She at times sat on her bladder.  She has tolerated her pessary very well and has kept it in since it was initially placed.  She has been treated for BV twice.  She describes a persistent/chronic asymptomatic vaginal drainage. It is due to be cleaned in the next few weeks.  Currently she denies any vaginal pain or discomfort.  She denies any voiding symptoms or gross hematuria.  Her nausea is improving and she thinks she'd like to try eating some now.    Review of systems: A 12 point comprehensive review of systems was obtained and is negative unless otherwise stated in the history of present illness.  Patient Active Problem List   Diagnosis Date Noted  . PID (acute pelvic inflammatory disease) 05/27/2014  . Vaginal inflammation from pessary 05/27/2014  . Leukocytosis 05/27/2014  . Nausea vomiting and diarrhea 05/27/2014  . Hypertension   . Hiatal hernia   . Lung nodules   . Glaucoma   . GERD (gastroesophageal reflux disease)     No current facility-administered medications on file prior to encounter.   No current outpatient prescriptions on file prior to encounter.    Past Medical History  Diagnosis Date  . Hypertension   . Arthritis   . Hiatal hernia   . Diverticulosis   . Lung nodules     Right  . Atherosclerosis of aorta   . Scoliosis   . Glaucoma   . GERD  (gastroesophageal reflux disease)     Past Surgical History  Procedure Laterality Date  . Fracture surgery      Tibia/fibula of right leg  . Abdominal hysterectomy    . Eye surgery      History  Substance Use Topics  . Smoking status: Former Games developer  . Smokeless tobacco: Not on file  . Alcohol Use: No    Family History  Problem Relation Age of Onset  . Cancer Brother     Stomach  . Heart disease Father     Died age 44  . Diabetes Mother   . Heart failure Mother     Died 69    PE: Filed Vitals:   05/27/14 1959 05/27/14 2121 05/28/14 0532 05/28/14 1500  BP: 107/55 113/65 110/66 121/67  Pulse: 94 85 79 78  Temp:  98.1 F (36.7 C) 97.4 F (36.3 C) 98.2 F (36.8 C)  TempSrc:  Oral Oral Oral  Resp: 15 18 16 18   Height:  5' (1.524 m)    Weight:  52.844 kg (116 lb 8 oz)    SpO2: 92% 96% 95% 95%   Patient appears to be in no acute distress  patient is alert and oriented x3 Atraumatic normocephalic head No cervical or supraclavicular lymphadenopathy appreciated No increased work of breathing, no audible wheezes/rhonchi Regular sinus rhythm/rate Abdomen is soft, nontender, nondistended, no CVA or suprapubic tenderness Lower extremities are symmetric without appreciable  edema Grossly neurologically intact No identifiable skin lesions   Recent Labs  05/27/14 1210 05/28/14 0540  WBC 15.7* 8.1  HGB 12.5 11.3*  HCT 39.6 35.8*    Recent Labs  05/27/14 1210 05/28/14 0540  NA 138 138  K 4.1 4.1  CL 107 109  CO2 22 22  GLUCOSE 136* 110*  BUN 22 15  CREATININE 0.80 0.86  CALCIUM 8.7 8.5   No results for input(s): LABPT, INR in the last 72 hours. No results for input(s): LABURIN in the last 72 hours. Results for orders placed or performed during the hospital encounter of 05/27/14  Clostridium Difficile by PCR     Status: None   Collection Time: 05/28/14  8:06 AM  Result Value Ref Range Status   C difficile by pcr NEGATIVE NEGATIVE Final    Imaging: I  have independently reviewed the patient's CT scan which is largely unremarkable.  Imp: I suspect the patient has a viral gastroenteritis and possibly an asymptomatic bacterial vaginosis.  Recommendations: I discussed removing the patient's pessary and giving her vaginitis a chance to heal, which the patient is very reluctant to consent to given her severe prolapse and associated symptoms.  I also discussed cleaning it and then replacing it.  In the hospital it is difficult to do this without the proper equipment and the patient notes pain/agony when it is removed and is also reluctant to proceed with this as well.  Ultimately we opted to leave it in.  She is asymptomatic and as such I don't think this is unreasonable.  We discussed starting a probiotic and consider starting vaginal suppositories of boric acid.  We'll start with a probiotic and get her scheduled for cleaning in our clinic within the next 1-2 weeks.  Would continue Flagyl for total of 5-7 days.  Will consider boric acid in the future if she has recurrences of BV.  Thank you for involving me in this patient's care, I will continue to follow along. Berniece Salines W

## 2014-05-29 LAB — URINE CULTURE: SPECIAL REQUESTS: NORMAL

## 2014-05-29 MED ORDER — PANTOPRAZOLE SODIUM 40 MG PO TBEC
40.0000 mg | DELAYED_RELEASE_TABLET | Freq: Every day | ORAL | Status: DC
  Administered 2014-05-29 – 2014-06-01 (×4): 40 mg via ORAL
  Filled 2014-05-29 (×5): qty 1

## 2014-05-29 MED ORDER — OXYQUINOLONE SULFATE 0.025 % VA GEL: VAGINAL | Status: DC

## 2014-05-29 MED ORDER — METRONIDAZOLE 500 MG PO TABS
500.0000 mg | ORAL_TABLET | Freq: Three times a day (TID) | ORAL | Status: DC
  Administered 2014-05-29: 500 mg via ORAL
  Filled 2014-05-29 (×3): qty 1

## 2014-05-29 MED ORDER — DOXYCYCLINE HYCLATE 100 MG PO TABS
100.0000 mg | ORAL_TABLET | Freq: Two times a day (BID) | ORAL | Status: DC
  Filled 2014-05-29: qty 1

## 2014-05-29 MED ORDER — ONDANSETRON 4 MG PO TBDP
4.0000 mg | ORAL_TABLET | Freq: Four times a day (QID) | ORAL | Status: DC | PRN
  Administered 2014-05-29 – 2014-05-30 (×3): 4 mg via ORAL
  Filled 2014-05-29 (×3): qty 1

## 2014-05-29 MED ORDER — GI COCKTAIL ~~LOC~~
30.0000 mL | Freq: Once | ORAL | Status: DC
  Filled 2014-05-29: qty 30

## 2014-05-29 NOTE — Progress Notes (Signed)
Alexis Yates ZOX:[id number] DOB: Nov 04, 1927 DOA: 05/27/2014 PCP: Pearla Dubonnet, MD   Brief narrative:  81 y/o ? Htn, OA, Lung nodules on prior CT, Chronic Glaucoma, chronic prior PID[?] Admitted 05/27/14 with 2-3/7 H/o n/V/D + diffuse colicky abdominal pain-chronic pessary, X2/YR Urology consulted at patient request.  Past medical history-As per Problem list Chart reviewed as below-   Consultants:  Dr Herrick-Urology  Procedures:  --  Antibiotics:  Flagyl 3/14  Cipro 3/14   Subjective   Doing fair.  Mild n/v. No CP, NO diarr Vaginal discharge is improved Mild abdominal pain   Objective    Interim History:   Telemetry: None telemetry   Objective: Filed Vitals:   05/28/14 0532 05/28/14 1500 05/28/14 2123 05/29/14 0514  BP: 110/66 121/67 118/70 160/75  Pulse: 79 78 79 80  Temp: 97.4 F (36.3 C) 98.2 F (36.8 C) 97.9 F (36.6 C) 98 F (36.7 C)  TempSrc: Oral Oral Oral Oral  Resp: Height:      Weight:      SpO2: 95% 95% 95% 97%    Intake/Output Summary (Last 24 hours) at 05/29/14 1312 Last data filed at 05/29/14 0700  Gross per 24 hour  Intake 2976.68 ml  Output      0 ml  Net 2976.68 ml    Exam:  General: EOMI NCAT Cardiovascular: S1-S2 no murmur rub or gallop Respiratory: Clinically clear no added sound Abdomen: Slightly tender right lower quadrant no rebound Skin no lower extremity edema Neuro intact  Data Reviewed: Basic Metabolic Panel:  Recent Labs Lab 05/27/14 1210 05/28/14 0540  NA 138 138  K 4.1 4.1  CL 107 109  CO2 22 22  GLUCOSE 136* 110*  BUN 22 15  CREATININE 0.80 0.86  CALCIUM 8.7 8.5   Liver Function Tests:  Recent Labs Lab 05/27/14 1210  AST 19  ALT 14  ALKPHOS 73  BILITOT 0.3  PROT 6.5  ALBUMIN 3.4*   No results for input(s): LIPASE, AMYLASE in the last 168 hours. No results for input(s): AMMONIA in the last 168 hours. CBC:  Recent Labs Lab 05/27/14 1210 05/28/14 0540    WBC 15.7* 8.1  NEUTROABS 14.3*  --   HGB 12.5 11.3*  HCT 39.6 35.8*  MCV 96.1 96.2  PLT 357 340   Cardiac Enzymes: No results for input(s): CKTOTAL, CKMB, CKMBINDEX, TROPONINI in the last 168 hours. BNP: Invalid input(s): POCBNP CBG: No results for input(s): GLUCAP in the last 168 hours.  Recent Results (from the past 240 hour(s))  Urine culture     Status: None (Preliminary result)   Collection Time: 05/27/14  1:40 PM  Result Value Ref Range Status   Specimen Description URINE, RANDOM  Final   Special Requests Normal  Final   Colony Count PENDING  Incomplete   Culture   Final    Culture reincubated for better growth Performed at [facility]    Report Status PENDING  Incomplete  Blood culture (routine x 2)     Status: None (Preliminary result)   Collection Time: 05/27/14  5:48 PM  Result Value Ref Range Status   Specimen Description BLOOD LAC  Final   Special Requests Normal  Final   Culture   Final           BLOOD CULTURE RECEIVED NO GROWTH TO DATE CULTURE WILL BE HELD FOR 5 DAYS BEFORE ISSUING A FINAL NEGATIVE REPORT Performed at [facility]  Report Status PENDING  Incomplete  Blood culture (routine x 2)     Status: None (Preliminary result)   Collection Time: 05/27/14  5:55 PM  Result Value Ref Range Status   Specimen Description BLOOD RIGHT HAND  Final   Special Requests Normal  Final   Culture   Final           BLOOD CULTURE RECEIVED NO GROWTH TO DATE CULTURE WILL BE HELD FOR 5 DAYS BEFORE ISSUING A FINAL NEGATIVE REPORT Performed at [facility]    Report Status PENDING  Incomplete  Clostridium Difficile by PCR     Status: None   Collection Time: 05/28/14  8:06 AM  Result Value Ref Range Status   C difficile by pcr NEGATIVE NEGATIVE Final     Studies:              All Imaging reviewed and is as per above notation   Scheduled Meds: . brimonidine  1 drop Both Eyes BID  . calcium-vitamin D  1 tablet Oral BID  . citalopram  20  mg Oral Daily  . dorzolamide  1 drop Both Eyes BID  . enoxaparin (LOVENOX) injection  40 mg Subcutaneous Q24H  . lactobacillus acidophilus & bulgar  1 tablet Oral TID WC  . latanoprost  1 drop Both Eyes QHS  . metroNIDAZOLE  500 mg Oral TID  . pantoprazole  40 mg Oral Daily  . polyvinyl alcohol  1 drop Both Eyes BID   Continuous Infusions: . sodium chloride 100 mL/hr at 05/28/14 1959     Assessment/Plan: 1. Leukocoria with possible bacterial vaginosis-unlikely infectious-patient has pessary which can act as a nidus for localized excessive vaginal secretion. Patient to continue Flagyl 500 3 times a day.  We can also transitioned this to Flagyl 2 g stat and that way patient can discontinue this prior to discharge. I've discontinued doxycycline as I do not think there is a role in coverage as Flagyl will cover all anaerobes. I have also discussed in person with Dr. Marlou Porch of urology who recommends Trima-Sol vaginal application to sterilize the introitus patient is not a candidate for bladder sling at this stage as she wishes to have knee surgery first 2. Nausea vomiting-likely secondary to Flagyl. If this persists see above in terms of transition to 2 g Flagyl otherwise can transitioned to clindamycin which has similar coverage. 3. HTN-not on any medications currently 4. Bipolar-continue citalopram 20 daily 5. Osteoporosis continue calcium plus vitamin D 1 tablet twice a day  6. GERD continue pantoprazole 40 daily  7. Glaucoma continue latanoprost 1 drop twice a day   Code Status: Full  Communication:  Disposition Plan: Can discharge home tomorrow if all stable and no further vomiting   Pleas Koch, MD  [facility] Pager [phone] 05/29/2014, 1:12 PM    LOS: 2 days

## 2014-05-29 NOTE — Progress Notes (Signed)
Urology Inpatient Progress Report Hiatal hernia [K44.9] Colitis [K52.9] Pain [R52] UTI (lower urinary tract infection) [N39.0] Lung nodules [R91.8] Essential hypertension [I10] Glaucoma [H40.9] Gastroesophageal reflux disease, esophagitis presence not specified [K21.9] 05/27/2014  Intv/Subj: Patient's abdominal pain is largely improved, she does continue to have intermittent nausea/emesis.  Recently it was associated with her Flagyl administration.  Denies any voiding symptoms, pelvic pain.  Continues to have some persistent vaginal discharge, this has not been foul-smelling.  Past Medical History  Diagnosis Date  . Hypertension   . Arthritis   . Hiatal hernia   . Diverticulosis   . Lung nodules     Right  . Atherosclerosis of aorta   . Scoliosis   . Glaucoma   . GERD (gastroesophageal reflux disease)    Current Facility-Administered Medications  Medication Dose Route Frequency Provider Last Rate Last Dose  . 0.9 %  sodium chloride infusion   Intravenous Continuous Rhetta Mura, MD 100 mL/hr at 05/28/14 1959    . acetaminophen (TYLENOL) tablet 650 mg  650 mg Oral Q6H PRN Maryruth Bun Rama, MD       Or  . acetaminophen (TYLENOL) suppository 650 mg  650 mg Rectal Q6H PRN Maryruth Bun Rama, MD      . ALPRAZolam Prudy Feeler) tablet 0.5 mg  0.5 mg Oral BID PRN Maryruth Bun Rama, MD   0.5 mg at 05/28/14 0746  . brimonidine (ALPHAGAN) 0.15 % ophthalmic solution 1 drop  1 drop Both Eyes BID Maryruth Bun Rama, MD   1 drop at 05/29/14 1051  . calcium-vitamin D (OSCAL WITH D) 500-200 MG-UNIT per tablet 1 tablet  1 tablet Oral BID Maurice March, RPH   1 tablet at 05/29/14 1051  . citalopram (CELEXA) tablet 20 mg  20 mg Oral Daily Maryruth Bun Rama, MD   20 mg at 05/28/14 1046  . dorzolamide (TRUSOPT) 2 % ophthalmic solution 1 drop  1 drop Both Eyes BID Maryruth Bun Rama, MD   1 drop at 05/29/14 1051  . doxycycline (VIBRA-TABS) tablet 100 mg  100 mg Oral Q12H Jai-Gurmukh Samtani, MD      .  enoxaparin (LOVENOX) injection 40 mg  40 mg Subcutaneous Q24H Maryruth Bun Rama, MD   40 mg at 05/28/14 2146  . lactobacillus acidophilus & bulgar (LACTINEX) chewable tablet 1 tablet  1 tablet Oral TID WC Crist Fat, MD   1 tablet at 05/29/14 [phone]  . latanoprost (XALATAN) 0.005 % ophthalmic solution 1 drop  1 drop Both Eyes QHS Maryruth Bun Rama, MD   1 drop at 05/28/14 2142  . loperamide (IMODIUM) capsule 2 mg  2 mg Oral PRN Maryann Mikhail, DO   2 mg at 05/28/14 1729  . metroNIDAZOLE (FLAGYL) tablet 500 mg  500 mg Oral TID Rhetta Mura, MD   500 mg at 05/29/14 1051  . morphine 4 MG/ML injection 4-8 mg  4-8 mg Intravenous Q2H PRN Maryruth Bun Rama, MD   4 mg at 05/29/14 1101  . pantoprazole (PROTONIX) EC tablet 40 mg  40 mg Oral Daily Rhetta Mura, MD      . polyvinyl alcohol (LIQUIFILM TEARS) 1.4 % ophthalmic solution 1 drop  1 drop Both Eyes BID Maurice March, RPH   1 drop at 05/29/14 1051  . promethazine (PHENERGAN) injection 12.5 mg  12.5 mg Intravenous Q4H PRN Maryruth Bun Rama, MD   12.5 mg at 05/29/14 1051  . simethicone (MYLICON) chewable tablet 160 mg  160 mg Oral QID PRN Roma Kayser  Schorr, NP   160 mg at 05/28/14 0258     Objective: Vital: Filed Vitals:   05/28/14 0532 05/28/14 1500 05/28/14 2123 05/29/14 0514  BP: 110/66 121/67 118/70 160/75  Pulse: 79 78 79 80  Temp: 97.4 F (36.3 C) 98.2 F (36.8 C) 97.9 F (36.6 C) 98 F (36.7 C)  TempSrc: Oral Oral Oral Oral  Resp: Height:      Weight:      SpO2: 95% 95% 95% 97%   I/Os: I/O last 3 completed shifts: In: 4445.4 [P.O.:1040; I.V.:2705.4; IV Piggyback:700] Out: -   Physical Exam:  General: Patient is in no apparent distress Lungs: Normal respiratory effort, chest expands symmetrically. GI: The abdomen is soft and nontender without mass. Ext: lower extremities symmetric  Lab Results:  Recent Labs  05/27/14 1210 05/28/14 0540  WBC 15.7* 8.1  HGB 12.5 11.3*  HCT 39.6 35.8*     Recent Labs  05/27/14 1210 05/28/14 0540  NA 138 138  K 4.1 4.1  CL 107 109  CO2 22 22  GLUCOSE 136* 110*  BUN 22 15  CREATININE 0.80 0.86  CALCIUM 8.7 8.5   No results for input(s): LABPT, INR in the last 72 hours. No results for input(s): LABURIN in the last 72 hours. Results for orders placed or performed during the hospital encounter of 05/27/14  Urine culture     Status: None (Preliminary result)   Collection Time: 05/27/14  1:40 PM  Result Value Ref Range Status   Specimen Description URINE, RANDOM  Final   Special Requests Normal  Final   Colony Count PENDING  Incomplete   Culture   Final    Culture reincubated for better growth Performed at [facility]    Report Status PENDING  Incomplete  Blood culture (routine x 2)     Status: None (Preliminary result)   Collection Time: 05/27/14  5:48 PM  Result Value Ref Range Status   Specimen Description BLOOD LAC  Final   Special Requests Normal  Final   Culture   Final           BLOOD CULTURE RECEIVED NO GROWTH TO DATE CULTURE WILL BE HELD FOR 5 DAYS BEFORE ISSUING A FINAL NEGATIVE REPORT Performed at [facility]    Report Status PENDING  Incomplete  Blood culture (routine x 2)     Status: None (Preliminary result)   Collection Time: 05/27/14  5:55 PM  Result Value Ref Range Status   Specimen Description BLOOD RIGHT HAND  Final   Special Requests Normal  Final   Culture   Final           BLOOD CULTURE RECEIVED NO GROWTH TO DATE CULTURE WILL BE HELD FOR 5 DAYS BEFORE ISSUING A FINAL NEGATIVE REPORT Performed at [facility]    Report Status PENDING  Incomplete  Clostridium Difficile by PCR     Status: None   Collection Time: 05/28/14  8:06 AM  Result Value Ref Range Status   C difficile by pcr NEGATIVE NEGATIVE Final    Studies/Results:   Assessment: The patient has persistent nausea and intermittent emesis although her pain has largely resolved. I suspect this is a viral  enteritis. She continues to have some vaginal discharge and no pelvic pain or evidence of vaginitis.  Plan: The plan is to treat the patient with a probiotic which I have written for.  We will also start Trimo-San vaginal irrigation 3 times  weekly to help reduce the pH to a normal vaginal pH. It is not unreasonable to continue with Flagyl for a total of 5-7 days. I will get her scheduled to see Dr. Retta Diones in the urology clinic within the next 2 weeks for a scheduled pessary cleaning.  Crist Fat 05/29/2014, 11:53 AM

## 2014-05-29 NOTE — Progress Notes (Signed)
PT Cancellation Note  Patient Details Name: ARETHA DORNAK MRN: [id number] DOB: [birth year]   Cancelled Treatment:     PT eval attempted but deferred 2* pts ongoing nausea.  RN aware and advises waiting for new orders for MEDs.  Will follow.     Jamarl Pew 05/29/2014, 2:19 PM

## 2014-05-29 NOTE — Clinical Social Work Note (Signed)
CSW met with patient and her daughter, Velta Addison, at bedside. SNF offers have been rec'd- pending PT eval/recommendations and insurance auth.  Also contacted patient's other daughter, Adonis Brook, to update- will await PT and proceed   Eduard Clos, MSW, [place]

## 2014-05-30 MED ORDER — LOPERAMIDE HCL 2 MG PO CAPS
2.0000 mg | ORAL_CAPSULE | Freq: Four times a day (QID) | ORAL | Status: DC | PRN
  Administered 2014-05-30 – 2014-06-01 (×4): 2 mg via ORAL
  Filled 2014-05-30 (×3): qty 1

## 2014-05-30 MED ORDER — SUCRALFATE 1 GM/10ML PO SUSP
1.0000 g | Freq: Three times a day (TID) | ORAL | Status: DC
  Administered 2014-05-30 – 2014-06-01 (×8): 1 g via ORAL
  Filled 2014-05-30 (×12): qty 10

## 2014-05-30 MED ORDER — SIMETHICONE 80 MG PO CHEW
80.0000 mg | CHEWABLE_TABLET | Freq: Once | ORAL | Status: AC
  Administered 2014-05-30: 80 mg via ORAL
  Filled 2014-05-30: qty 1

## 2014-05-30 NOTE — Clinical Social Work Note (Signed)
CSW updated family that she was able to walk 60 feet with min A. Daughter aware and plans for dc to home with [facility] and with some hired assistance as well. RNCM advised and will also update MD. Daughter requesting some notice on when she may go home to arrange for caregivers. CSW advised her probably in the next 24 hours- will ask RNCM to update her further as determined.    Reece Levy, MSW, Theresia Majors [phone]

## 2014-05-30 NOTE — Progress Notes (Signed)
CARE MANAGEMENT NOTE 05/30/2014  Patient:  Alexis Yates, Alexis Yates   Account Number:  [id number]  Date Initiated:  05/28/2014  Documentation initiated by:  Ferdinand Cava  Subjective/Objective Assessment:   81 yo female admitted with acute pelvic inflammatory disease from home     Action/Plan:   discharge planning   Anticipated DC Date:  05/31/2014   Anticipated DC Plan:  HOME W HOME HEALTH SERVICES  In-house referral  Clinical Social Worker      DC Planning Services  CM consult      Choice offered to / List presented to:  C-4 Adult Children           HH agency  [facility]   Status of service:  In process, will continue to follow Medicare Important Message given?  YES (If response is "NO", the following Medicare IM given date fields will be blank) Date Medicare IM given:  05/30/2014 Medicare IM given by:  Ferdinand Cava Date Additional Medicare IM given:   Additional Medicare IM given by:    Discharge Disposition:  HOME W HOME HEALTH SERVICES  Per UR Regulation:    If discussed at Long Length of Stay Meetings, dates discussed:    Comments:  05/30/14 Ferdinand Cava RN BSn CM [phone] Spoke with patient daughter Lorene Dy per patient request, at [phone], and Lorene Dy stated that she appreciates [facility] PT, and OT, but would also like to have a [facility] RN assigned. She stated that she will hire private caregivers to stay with her mother 24/7 and is requesting information regarding possible dc time to have help in place. Await HH orders and will follow up with patient daughter.  05/28/14 Ferdinand Cava RN BSN CM [phone] Spoke with patient and daughter in the room. Patient lives home alone and has a walker, cane, shower chair, elevated toilet seat, with the support of her daughter. The patient stated that she has not HH services in the past but she has been to 2 different facilities in the past for rehab, 1 in [place], and also [facility]. The patient stated that she is  interested in rehab if recommended. The patient and daughter state that the patient will not go to [facility] and wants to remain in the [place] area. Will continue to follow on PT's recommendations, CSW made aware.

## 2014-05-30 NOTE — Progress Notes (Signed)
Alexis Yates:[id number] DOB: 1928/02/23 DOA: 05/27/2014 PCP: Pearla Dubonnet, MD   Brief narrative:  81 y/o ? Htn, OA, Lung nodules on prior CT, Chronic Glaucoma, chronic prior PID[?] Admitted 05/27/14 with 2-3/7 H/o n/V/D + diffuse colicky abdominal pain-chronic pessary, X 2/Yrly Urology consulted at patient request.  Past medical history-As per Problem list Chart reviewed as below-   Consultants:  Dr Herrick-Urology  Procedures:  --  Antibiotics:  Flagyl 3/14-->3/16  Cipro 3/14-->3/16   Subjective   Doing a little better 1 episode emesis last pm No cp No n right now, still having some loose stool   Objective    Interim History:   Telemetry: None telemetry   Objective: Filed Vitals:   05/30/14 0546 05/30/14 0825 05/30/14 1000 05/30/14 1400  BP: 129/68 124/76 113/72 115/78  Pulse: 70 77 79 75  Temp: 97.8 F (36.6 C) 97.9 F (36.6 C) 98.3 F (36.8 C) 98.3 F (36.8 C)  TempSrc: Oral Oral Oral Oral  Resp: 16 16 14 14   Height:      Weight:      SpO2: 97% 95% 94% 97%    Intake/Output Summary (Last 24 hours) at 05/30/14 1527 Last data filed at 05/30/14 1130  Gross per 24 hour  Intake   2600 ml  Output      0 ml  Net   2600 ml    Exam:  General: EOMI NCAT Cardiovascular: S1-S2 no murmur rub or gallop Respiratory: Clinically clear no added sound Abdomen: Slightly tender right lower quadrant no rebound Skin no lower extremity edema Neuro intact  Data Reviewed: Basic Metabolic Panel:  Recent Labs Lab 05/27/14 1210 05/28/14 0540  NA 138 138  K 4.1 4.1  CL 107 109  CO2 22 22  GLUCOSE 136* 110*  BUN 22 15  CREATININE 0.80 0.86  CALCIUM 8.7 8.5   Liver Function Tests:  Recent Labs Lab 05/27/14 1210  AST 19  ALT 14  ALKPHOS 73  BILITOT 0.3  PROT 6.5  ALBUMIN 3.4*   No results for input(s): LIPASE, AMYLASE in the last 168 hours. No results for input(s): AMMONIA in the last 168 hours. CBC:  Recent Labs Lab  05/27/14 1210 05/28/14 0540  WBC 15.7* 8.1  NEUTROABS 14.3*  --   HGB 12.5 11.3*  HCT 39.6 35.8*  MCV 96.1 96.2  PLT 357 340   Cardiac Enzymes: No results for input(s): CKTOTAL, CKMB, CKMBINDEX, TROPONINI in the last 168 hours. BNP: Invalid input(s): POCBNP CBG: No results for input(s): GLUCAP in the last 168 hours.  Recent Results (from the past 240 hour(s))  Urine culture     Status: None   Collection Time: 05/27/14  1:40 PM  Result Value Ref Range Status   Specimen Description URINE, RANDOM  Final   Special Requests Normal  Final   Colony Count   Final    >=100,000 COLONIES/ML Performed at [facility]    Culture   Final    Multiple bacterial morphotypes present, none predominant. Suggest appropriate recollection if clinically indicated. Performed at [facility]    Report Status 05/29/2014 FINAL  Final  Blood culture (routine x 2)     Status: None (Preliminary result)   Collection Time: 05/27/14  5:48 PM  Result Value Ref Range Status   Specimen Description BLOOD LAC  Final   Special Requests Normal  Final   Culture   Final           BLOOD CULTURE RECEIVED  NO GROWTH TO DATE CULTURE WILL BE HELD FOR 5 DAYS BEFORE ISSUING A FINAL NEGATIVE REPORT Performed at [facility]    Report Status PENDING  Incomplete  Blood culture (routine x 2)     Status: None (Preliminary result)   Collection Time: 05/27/14  5:55 PM  Result Value Ref Range Status   Specimen Description BLOOD RIGHT HAND  Final   Special Requests Normal  Final   Culture   Final           BLOOD CULTURE RECEIVED NO GROWTH TO DATE CULTURE WILL BE HELD FOR 5 DAYS BEFORE ISSUING A FINAL NEGATIVE REPORT Performed at [facility]    Report Status PENDING  Incomplete  Clostridium Difficile by PCR     Status: None   Collection Time: 05/28/14  8:06 AM  Result Value Ref Range Status   C difficile by pcr NEGATIVE NEGATIVE Final     Studies:              All Imaging reviewed and  is as per above notation   Scheduled Meds: . brimonidine  1 drop Both Eyes BID  . calcium-vitamin D  1 tablet Oral BID  . citalopram  20 mg Oral Daily  . dorzolamide  1 drop Both Eyes BID  . enoxaparin (LOVENOX) injection  40 mg Subcutaneous Q24H  . gi cocktail  30 mL Oral Once  . lactobacillus acidophilus & bulgar  1 tablet Oral TID WC  . latanoprost  1 drop Both Eyes QHS  . pantoprazole  40 mg Oral Daily  . polyvinyl alcohol  1 drop Both Eyes BID   Continuous Infusions: . sodium chloride 50 mL/hr at 05/30/14 0658     Assessment/Plan:  1. Leukocoria with possible bacterial vaginosis-unlikely infectious-patient has pessary which can act as a nidus. discontinued doxycycline as no role in coverage.  D/c Flagyl 2/2 to intolerance. Patient will getTrima-Sol vaginal application to sterilize the introitus on d/c at [facility] 2. Nausea vomiting-likely secondary to Flagyl/ non-specific Enteritis-improved--As diarrhea non-bloody will consider Imodium use if persistent 3. HTN-not on any medications currently 4. Bipolar-continue citalopram 20 daily 5. Osteoporosis continue calcium plus vitamin D 1 tablet twice a day  6. GERD continue pantoprazole 40 daily  7. Glaucoma continue latanoprost 1 drop twice a day   Code Status: Full  Communication:  Disposition Plan: Can discharge home tomorrow if all stable and no further vomiting   Pleas Koch, MD  [facility] Pager [phone] 05/30/2014, 3:27 PM    LOS: 3 days

## 2014-05-30 NOTE — Evaluation (Signed)
Physical Therapy Evaluation Patient Details Name: Alexis Yates MRN: [id number] DOB: 1927-04-02 Today's Date: 05/30/2014   History of Present Illness  81 yo female admitted with acute pelvic inflammatory disease. Hx of HTN, chronic pain, scoliosis, glaucoma, cystocele-managed with pessary. PT lives alone.  Clinical Impression  On eval, pt required Min assist for mobility-able to walk ~60 'x2 (once with Rw, once without AD). Instructed pt to use walker at home. Discussed d/c plan-pt states she hope to return home-plans to arrange intermittent assistance. Recommend HHPT, HHOT, home health aide at discharge.     Follow Up Recommendations Home health PT;Supervision - Intermittent;Home Health OT; Home Health Aide    Equipment Recommendations  None recommended by PT    Recommendations for Other Services OT consult     Precautions / Restrictions Precautions Precautions: Fall Restrictions Weight Bearing Restrictions: No      Mobility  Bed Mobility Overal bed mobility: Needs Assistance Bed Mobility: Supine to Sit     Supine to sit: HOB elevated;Modified independent (Device/Increase time)     General bed mobility comments: Increased time.   Transfers Overall transfer level: Needs assistance Equipment used: Rolling walker (2 wheeled);None Transfers: Sit to/from Stand Sit to Stand: Min guard         General transfer comment: close guard for safety. VCs safety, hand placemenet  Ambulation/Gait Ambulation/Gait assistance: Min assist;Min guard Ambulation Distance (Feet): 60 Feet (x2) Assistive device: Rolling walker (2 wheeled);None Gait Pattern/deviations: Step-through pattern;Trunk flexed;Decreased stride length;Decreased step length - left;Decreased step length - right     General Gait Details: Min assist to walk without an assistive device. Min guard to walk with RW. slow gait speed. seated rest break taken between walks.   Stairs            Wheelchair  Mobility    Modified Rankin (Stroke Patients Only)       Balance Overall balance assessment: Needs assistance         Standing balance support: No upper extremity supported;During functional activity Standing balance-Leahy Scale: Fair Standing balance comment: need RW for improved safety and stability                             Pertinent Vitals/Pain Pain Assessment: Faces Faces Pain Scale: Hurts little more Pain Location: chronic joint pain Pain Descriptors / Indicators: Aching Pain Intervention(s): Monitored during session;Repositioned    Home Living Family/patient expects to be discharged to:: Private residence Living Arrangements: Alone   Type of Home: House Home Access: Ramped entrance     Home Layout: One level Home Equipment: Bedside commode;Cane - single point;Shower seat;Hand held Careers information officer - 4 wheels      Prior Function Level of Independence: Independent      ADL's / Homemaking Assistance Needed: housekeeper        Higher education careers adviser        Extremity/Trunk Assessment   Upper Extremity Assessment: Generalized weakness           Lower Extremity Assessment: Generalized weakness      Cervical / Trunk Assessment: Kyphotic  Communication   Communication: No difficulties  Cognition Arousal/Alertness: Awake/alert Behavior During Therapy: WFL for tasks assessed/performed Overall Cognitive Status: Within Functional Limits for tasks assessed                      General Comments      Exercises        Assessment/Plan  PT Assessment Patient needs continued PT services  PT Diagnosis Difficulty walking;Generalized weakness   PT Problem List Decreased strength;Decreased activity tolerance;Decreased balance;Decreased mobility;Pain  PT Treatment Interventions DME instruction;Gait training;Functional mobility training;Therapeutic activities;Therapeutic exercise;Patient/family education;Balance training   PT Goals  (Current goals can be found in the Care Plan section) Acute Rehab PT Goals Patient Stated Goal: to return home PT Goal Formulation: With patient Time For Goal Achievement: 06/13/14 Potential to Achieve Goals: Good    Frequency Min 3X/week   Barriers to discharge        Co-evaluation               End of Session Equipment Utilized During Treatment: Gait belt Activity Tolerance: Patient tolerated treatment well Patient left: in chair;with call bell/phone within reach           Time: 1103-1123 PT Time Calculation (min) (ACUTE ONLY): 20 min   Charges:   PT Evaluation $Initial PT Evaluation Tier I: 1 Procedure     PT G Codes:        Rebeca Alert, MPT Pager: [phone]

## 2014-05-31 NOTE — Progress Notes (Signed)
Alexis Yates SFS:[id number] DOB: 14-Jan-1928 DOA: 05/27/2014 PCP: Pearla Dubonnet, MD   Brief narrative:  81 y/o ? Htn, OA, Lung nodules on prior CT, Chronic Glaucoma, chronic prior PID[?] Admitted 05/27/14 with 2-3/7 H/o n/V/D + diffuse colicky abdominal pain-chronic pessary, X 2/Yrly Urology consulted at patient request.  Past medical history-As per Problem list Chart reviewed as below-   Consultants:  Dr Herrick-Urology  Procedures:  --  Antibiotics:  Flagyl 3/14-->3/16  Cipro 3/14-->3/16   Subjective    3 episodes of diarrhea today  Tolerating diet much better without nausea  Slightly emotional this morning  No vaginal discharge or discomfort    Objective    Interim History:   Telemetry: None telemetry   Objective: Filed Vitals:   05/30/14 1400 05/30/14 1707 05/30/14 2159 05/31/14 0528  BP: 115/78 138/74 140/72 129/71  Pulse: 75 71 79 71  Temp: 98.3 F (36.8 C) 98 F (36.7 C) 98.2 F (36.8 C) 97.5 F (36.4 C)  TempSrc: Oral Oral Oral Oral  Resp: 14 16 16 16   Height:      Weight:      SpO2: 97% 97% 95% 95%    Intake/Output Summary (Last 24 hours) at 05/31/14 1616 Last data filed at 05/31/14 1005  Gross per 24 hour  Intake 1351.67 ml  Output      0 ml  Net 1351.67 ml    Exam:  General: EOMI NCAT Cardiovascular: S1-S2 no murmur rub or gallop Respiratory: Clinically clear no added sound Abdomen: Slightly tender right lower quadrant no rebound Skin no lower extremity edema Neuro intact  Data Reviewed: Basic Metabolic Panel:  Recent Labs Lab 05/27/14 1210 05/28/14 0540  NA 138 138  K 4.1 4.1  CL 107 109  CO2 22 22  GLUCOSE 136* 110*  BUN 22 15  CREATININE 0.80 0.86  CALCIUM 8.7 8.5   Liver Function Tests:  Recent Labs Lab 05/27/14 1210  AST 19  ALT 14  ALKPHOS 73  BILITOT 0.3  PROT 6.5  ALBUMIN 3.4*   No results for input(s): LIPASE, AMYLASE in the last 168 hours. No results for input(s): AMMONIA in the  last 168 hours. CBC:  Recent Labs Lab 05/27/14 1210 05/28/14 0540  WBC 15.7* 8.1  NEUTROABS 14.3*  --   HGB 12.5 11.3*  HCT 39.6 35.8*  MCV 96.1 96.2  PLT 357 340   Cardiac Enzymes: No results for input(s): CKTOTAL, CKMB, CKMBINDEX, TROPONINI in the last 168 hours. BNP: Invalid input(s): POCBNP CBG: No results for input(s): GLUCAP in the last 168 hours.  Recent Results (from the past 240 hour(s))  Urine culture     Status: None   Collection Time: 05/27/14  1:40 PM  Result Value Ref Range Status   Specimen Description URINE, RANDOM  Final   Special Requests Normal  Final   Colony Count   Final    >=100,000 COLONIES/ML Performed at [facility]    Culture   Final    Multiple bacterial morphotypes present, none predominant. Suggest appropriate recollection if clinically indicated. Performed at [facility]    Report Status 05/29/2014 FINAL  Final  Blood culture (routine x 2)     Status: None (Preliminary result)   Collection Time: 05/27/14  5:48 PM  Result Value Ref Range Status   Specimen Description BLOOD LAC  Final   Special Requests Normal  Final   Culture   Final           BLOOD CULTURE  RECEIVED NO GROWTH TO DATE CULTURE WILL BE HELD FOR 5 DAYS BEFORE ISSUING A FINAL NEGATIVE REPORT Performed at [facility]    Report Status PENDING  Incomplete  Blood culture (routine x 2)     Status: None (Preliminary result)   Collection Time: 05/27/14  5:55 PM  Result Value Ref Range Status   Specimen Description BLOOD RIGHT HAND  Final   Special Requests Normal  Final   Culture   Final           BLOOD CULTURE RECEIVED NO GROWTH TO DATE CULTURE WILL BE HELD FOR 5 DAYS BEFORE ISSUING A FINAL NEGATIVE REPORT Performed at [facility]    Report Status PENDING  Incomplete  Clostridium Difficile by PCR     Status: None   Collection Time: 05/28/14  8:06 AM  Result Value Ref Range Status   C difficile by pcr NEGATIVE NEGATIVE Final      Studies:              All Imaging reviewed and is as per above notation   Scheduled Meds: . brimonidine  1 drop Both Eyes BID  . calcium-vitamin D  1 tablet Oral BID  . citalopram  20 mg Oral Daily  . dorzolamide  1 drop Both Eyes BID  . enoxaparin (LOVENOX) injection  40 mg Subcutaneous Q24H  . lactobacillus acidophilus & bulgar  1 tablet Oral TID WC  . latanoprost  1 drop Both Eyes QHS  . pantoprazole  40 mg Oral Daily  . polyvinyl alcohol  1 drop Both Eyes BID  . sucralfate  1 g Oral TID WC & HS   Continuous Infusions: . sodium chloride 50 mL/hr at 05/31/14 0424     Assessment/Plan:  1. Leukocoria with possible bacterial vaginosis-unlikely infectious-patient has pessary which can act as a nidus. discontinued doxycycline as no role in coverage.  D/c Flagyl 2/2 to intolerance. Patient will get Trima-Sol vaginal application to sterilize the introitus. 2. Nausea vomiting-likely secondary to Flagyl/ non-specific Enteritis-improved.  No further issues. 3. HTN-not on any medications currently 4. Bipolar-continue citalopram 20 daily 5. Osteoporosis continue calcium plus vitamin D 1 tablet twice a day  6. GERD continue pantoprazole 40 daily  7. Glaucoma continue latanoprost 1 drop twice a day   Code Status: Full  Communication:  Disposition Plan: ? D/c am if no further diarrhea   Pleas Koch, MD  [facility] Pager [phone] 05/31/2014, 4:16 PM    LOS: 4 days

## 2014-05-31 NOTE — Progress Notes (Signed)
Physical Therapy Treatment Patient Details Name: CLYDELL HABERLE MRN: [id number] DOB: [birth year] Today's Date: 05/31/2014    History of Present Illness 81 yo female admitted with acute pelvic inflammatory disease. Hx of HTN, chronic pain, scoliosis, glaucoma, cystocele-managed with pessary. PT lives alone.    PT Comments    Progressing with mobility.   Follow Up Recommendations  Home health PT;Supervision - Intermittent. HHOT. Home health Aide     Equipment Recommendations  None recommended by PT    Recommendations for Other Services OT consult     Precautions / Restrictions Precautions Precautions: Fall Restrictions Weight Bearing Restrictions: No    Mobility  Bed Mobility Overal bed mobility: Needs Assistance Bed Mobility: Supine to Sit;Sit to Supine     Supine to sit: Supervision;HOB elevated Sit to supine: Supervision;HOB elevated   General bed mobility comments: Increased time.   Transfers Overall transfer level: Needs assistance Equipment used: Rolling walker (2 wheeled) Transfers: Sit to/from Stand Sit to Stand: Min guard         General transfer comment: close guard for safety. VCs safety, hand placemenet  Ambulation/Gait Ambulation/Gait assistance: Min guard Ambulation Distance (Feet): 120 Feet Assistive device: Rolling walker (2 wheeled) Gait Pattern/deviations: Step-through pattern;Trunk flexed;Decreased stride length     General Gait Details: close guard for safety. improved gait speed compared to yesterday. pt tolerated well.    Stairs            Wheelchair Mobility    Modified Rankin (Stroke Patients Only)       Balance                                    Cognition Arousal/Alertness: Awake/alert Behavior During Therapy: WFL for tasks assessed/performed Overall Cognitive Status: Within Functional Limits for tasks assessed                      Exercises      General Comments        Pertinent  Vitals/Pain Pain Assessment: Faces Faces Pain Scale: Hurts little more Pain Location: chronic joint pain Pain Descriptors / Indicators: Aching;Sore Pain Intervention(s): Monitored during session    Home Living                      Prior Function            PT Goals (current goals can now be found in the care plan section) Progress towards PT goals: Progressing toward goals    Frequency  Min 3X/week    PT Plan Current plan remains appropriate    Co-evaluation             End of Session Equipment Utilized During Treatment: Gait belt Activity Tolerance: Patient tolerated treatment well Patient left: in bed;with call bell/phone within reach;with nursing/sitter in room     Time: 9753-0051 PT Time Calculation (min) (ACUTE ONLY): 9 min  Charges:  $Gait Training: 8-22 mins                    G Codes:      Rebeca Alert, MPT Pager: [phone]

## 2014-05-31 NOTE — Progress Notes (Signed)
CARE MANAGEMENT NOTE 05/31/2014  Patient:  Alexis Yates, Alexis Yates   Account Number:  [id number]  Date Initiated:  05/28/2014  Documentation initiated by:  Ferdinand Cava  Subjective/Objective Assessment:   81 yo female admitted with acute pelvic inflammatory disease from home     Action/Plan:   discharge planning   Anticipated DC Date:  05/31/2014   Anticipated DC Plan:  HOME W HOME HEALTH SERVICES  In-house referral  Clinical Social Worker      DC Associate Professor  CM consult      [facility] Choice  HOME HEALTH   Choice offered to / List presented to:  C-4 Adult Children        HH arranged  HH-1 RN  HH-10 DISEASE MANAGEMENT  HH-2 PT  HH-3 OT  HH-4 NURSE'S AIDE      HH agency  [facility]   Status of service:  Completed, signed off Medicare Important Message given?  YES (If response is "NO", the following Medicare IM given date fields will be blank) Date Medicare IM given:  05/30/2014 Medicare IM given by:  Ferdinand Cava Date Additional Medicare IM given:   Additional Medicare IM given by:    Discharge Disposition:  HOME W HOME HEALTH SERVICES  Per UR Regulation:    If discussed at Long Length of Stay Meetings, dates discussed:    Comments:  05/31/14 Ferdinand Cava RN BSN CM [phone] L/M with patient daughter Alexis Yates. Contcated Kristen with [facility] with referral for [facility] services and that patient is possible dc today otherwise definitely tomorrow.  05/30/14 Ferdinand Cava RN BSn CM [phone] Spoke with patient daughter Alexis Yates per patient request, at [phone], and Alexis Yates stated that she appreciates [facility] PT, and OT, but would also like to have a [facility] RN assigned. She stated that she will hire private caregivers to stay with her mother 24/7 and is requesting information regarding possible dc time to have help in place. Await HH orders and will follow up with patient daughter.  05/28/14 Ferdinand Cava RN BSN CM [phone] Spoke with patient and daughter in the room.  Patient lives home alone and has a walker, cane, shower chair, elevated toilet seat, with the support of her daughter. The patient stated that she has not HH services in the past but she has been to 2 different facilities in the past for rehab, 1 in [place], and also [facility]. The patient stated that she is interested in rehab if recommended. The patient and daughter state that the patient will not go to [facility] and wants to remain in the [place] area. Will continue to follow on PT's recommendations, CSW made aware.

## 2014-06-01 LAB — MAGNESIUM: Magnesium: 1.6 mg/dL (ref 1.5–2.5)

## 2014-06-01 MED ORDER — CARVEDILOL 6.25 MG PO TABS
6.2500 mg | ORAL_TABLET | Freq: Two times a day (BID) | ORAL | Status: DC
  Filled 2014-06-01 (×2): qty 1

## 2014-06-01 MED ORDER — ONDANSETRON 4 MG PO TBDP: 4.0000 mg | ORAL_TABLET | Freq: Four times a day (QID) | ORAL | Status: DC | PRN

## 2014-06-01 MED ORDER — LOPERAMIDE HCL 2 MG PO CAPS: 2.0000 mg | ORAL_CAPSULE | Freq: Four times a day (QID) | ORAL | Status: DC | PRN

## 2014-06-01 MED ORDER — SUCRALFATE 1 GM/10ML PO SUSP: 1.0000 g | Freq: Three times a day (TID) | ORAL | Status: DC

## 2014-06-01 NOTE — Discharge Summary (Signed)
Physician Discharge Summary  Alexis Yates ZOX:[id number] DOB: 05-Jul-1927 DOA: 05/27/2014  PCP: Pearla Dubonnet, MD  Admit date: 05/27/2014 Discharge date: 06/01/2014  Time spent: 45 minutes  Recommendations for Outpatient Follow-up:  1. Consider Chem-7, CBC 1 week 2. Please follow-up with primary care physician as already scheduled 3. We will request home health DT and supervision intermittently with a home health aide 4. Please follow-up with urologist regarding vaginal discharge and pessary care 5. Medications changed on this admission include-  Carafate 1 g 3 times a day added  Imodium 2 mg every 6 when necessary   Zofran ODT 4 mg every 6 when necessary added   Discharge Diagnoses:  Principal Problem:   PID (acute pelvic inflammatory disease) Active Problems:   Vaginal inflammation from pessary   Leukocytosis   Hypertension   Hiatal hernia   Lung nodules   Glaucoma   GERD (gastroesophageal reflux disease)   Nausea vomiting and diarrhea   Discharge Condition: Stable  Diet recommendation: Regular  Filed Weights   05/27/14 2121  Weight: 52.844 kg (116 lb 8 oz)    History of present illness:  81 y/o ? Htn, OA, Lung nodules on prior CT, Chronic Glaucoma, chronic prior PID[?] Admitted 05/27/14 with 2-3/7 H/o n/V/D + diffuse colicky abdominal pain-chronic pessary, X 2/Yrly Urology consulted at patient request.  Hospital Course:   1. Leukocoria with possible bacterial vaginosis-unlikely infectious-patient has pessary which can act as a nidus. discontinued doxycycline as no role in coverage. D/c Flagyl 2/2 to intolerance when she was on this. She experienced no fever no chills and no other issues and it was recommended by urology that patient  get Trima-Sol vaginal application to sterilize the introitus. Family will obtain this 2. Nausea vomiting-likely secondary to Flagyl/ non-specific Enteritis-improved. No further issues.-Patient was started on Zofran ODT as  well as Carafate this admission and her symptomatology seemed to improve. Further workup as an outpatient if needed. She had some diarrhea as well thought secondary to a gastroenteritis and amlodipine was given as a when necessary during hospital stay 3. HTN-not on any medications currently 4. Bipolar-continue citalopram 20 daily 5. Osteoporosis continue calcium plus vitamin D 1 tablet twice a day  6. GERD continue pantoprazole 40 daily  7. Glaucoma continue latanoprost 1 drop twice a day   Consultants: 8. Dr Herrick-Urology  Procedures:  --  Antibiotics:  Flagyl 3/14-->3/16  Cipro 3/14-->3/16  Discharge Exam: Filed Vitals:   06/01/14 0552  BP: 151/74  Pulse: 67  Temp: 98.7 F (37.1 C)  Resp: 18    General: Alert pleasant oriented in no apparent distress Cardiovascular: S1-S2 no murmur rub or gallop Respiratory: Clinically clear  Discharge Instructions   Discharge Instructions    Diet - low sodium heart healthy    Complete by:  As directed      Discharge patient    Complete by:  As directed   I would recommend that you get the vaginal rinse and use it 3 times a week until you see your urologist Dr. Retta Diones I would recommend also that you use of Imodium sparingly as this is only for symptomatic diarrhea and should not be used chronically Please follow-up with your primary care physician on 06/10/14 as you already have scheduled-decisions about further care and management can be made by him I would also recommend that you consider discussion about your other multiple elective surgeries as an out patient During your hospital stay we added a couple of new medications so please  look at your list of medications closely and carefully as some of them have changed I wish you all the best and take good care of yourself :)     Increase activity slowly    Complete by:  As directed           Current Discharge Medication List    START taking these medications   Details    loperamide (IMODIUM) 2 MG capsule Take 1 capsule (2 mg total) by mouth every 6 (six) hours as needed for diarrhea or loose stools. Qty: 30 capsule, Refills: 0    ondansetron (ZOFRAN-ODT) 4 MG disintegrating tablet Take 1 tablet (4 mg total) by mouth every 6 (six) hours as needed for nausea or vomiting. Qty: 20 tablet, Refills: 0    OXYQUINOLONE SULFATE VAGINAL (TRIMO-SAN) 0.025 % GEL 1 application 3 x a week Qty: 1 Tube, Refills: 0    sucralfate (CARAFATE) 1 GM/10ML suspension Take 10 mLs (1 g total) by mouth 4 (four) times daily -  with meals and at bedtime. Qty: 420 mL, Refills: 0      CONTINUE these medications which have NOT CHANGED   Details  ALPRAZolam (XANAX) 0.5 MG tablet Take 0.5 mg by mouth 2 (two) times daily as needed for anxiety.    bimatoprost (LUMIGAN) 0.03 % ophthalmic solution Place 1 drop into both eyes daily.    brimonidine (ALPHAGAN P) 0.1 % SOLN Place 1 drop into both eyes 2 (two) times daily.    calcium-vitamin D 250-100 MG-UNIT per tablet Take 1 tablet by mouth 2 (two) times daily.    celecoxib (CELEBREX) 200 MG capsule Take 200 mg by mouth 2 (two) times daily.    cetirizine (ZYRTEC) 10 MG tablet Take 10 mg by mouth daily.    citalopram (CELEXA) 20 MG tablet Take 20 mg by mouth daily.    docusate sodium (COLACE) 100 MG capsule Take 100 mg by mouth at bedtime.    dorzolamide (TRUSOPT) 2 % ophthalmic solution Place 1 drop into both eyes 2 (two) times daily.    lansoprazole (PREVACID) 30 MG capsule Take 30 mg by mouth daily at 12 noon.    lisinopril (PRINIVIL,ZESTRIL) 20 MG tablet Take 20 mg by mouth daily.    oxyCODONE (OXY IR/ROXICODONE) 5 MG immediate release tablet Take 5 mg by mouth 3 (three) times daily as needed (pain).    Polyethyl Glycol-Propyl Glycol (SYSTANE OP) Place 1 drop into both eyes 2 (two) times daily.    Vitamin D, Cholecalciferol, 1000 UNITS TABS Take 1,000 Units by mouth daily.       Allergies  Allergen Reactions  .  Penicillins Anaphylaxis      The results of significant diagnostics from this hospitalization (including imaging, microbiology, ancillary and laboratory) are listed below for reference.    Significant Diagnostic Studies: Ct Abdomen Pelvis W Contrast  05/27/2014   CLINICAL DATA:  Nausea and vomiting for 2 days.  EXAM: CT ABDOMEN AND PELVIS WITH CONTRAST  TECHNIQUE: Multidetector CT imaging of the abdomen and pelvis was performed using the standard protocol following bolus administration of intravenous contrast.  CONTRAST:  OMNIPAQUE IOHEXOL 300 MG/ML SOLN, 75mL OMNIPAQUE IOHEXOL 300 MG/ML SOLN  COMPARISON:  None  FINDINGS: There is a 4 mm subpleural nodule in the basilar right lower lobe (series 6, image 7). There is a 3 mm ground-glass attenuation nodule in the right middle lobe (series 6, image 4). Minimal subsegmental atelectasis is present in the lung bases. There is no pleural effusion.  Coronary artery calcification is partially visualized. Mitral annular calcification is also noted.  The liver, gallbladder, spleen, adrenal glands, and kidneys have an unremarkable enhanced appearance. There is mild intrahepatic and extrahepatic biliary dilatation,, with the common bile duct up to 10 mm in diameter. There is also moderate dilatation of the pancreatic duct, which measures up to 9 mm in the pancreatic head. No definite obstructing mass is identified in this region.  There is a small sliding hiatal hernia. Oral contrast is present in loops of nondilated small and large bowel to the level of the rectum without evidence of obstruction. There is diffuse diverticulosis of the descending and sigmoid colon. The appearance of mild wall thickening involving the sigmoid colon is favored to be secondary to underdistention, without pericolonic inflammatory changes seen to definitely indicate acute diverticulitis. The appendix is identified in the right lower quadrant and is unremarkable.  There is advanced  aortoiliac atherosclerotic calcification. No free fluid or enlarged lymph nodes are identified. The uterus is absent. Pelvic floor prolapse is noted with a pessary in place. Calcified granulomata are noted in the gluteal regions. Thoracolumbar dextroscoliosis is partially visualized. Severe right-sided disc space narrowing is present at L4-5.  IMPRESSION: 1. No definite acute abnormality identified in the abdomen or pelvis. 2. Small sliding hiatal hernia. 3. Mild biliary and moderate pancreatic ductal dilatation. No definite mass is identified on this study, however a small obstructing ampullary mass or stricture is possible. Consider ERCP or MRCP for further evaluation. 4. Diverticulosis without evidence of diverticulitis. 5. Small right lung nodules. If the patient is at high risk for bronchogenic carcinoma, follow-up chest CT at 1 year is recommended. If the patient is at low risk, no follow-up is needed. This recommendation follows the consensus statement: Guidelines for Management of Small Pulmonary Nodules Detected on CT Scans: A Statement from the Fleischner Society as published in Radiology 2005; 237:395-400.   Electronically Signed   By: Sebastian Ache   On: 05/27/2014 16:23    Microbiology: Recent Results (from the past 240 hour(s))  Urine culture     Status: None   Collection Time: 05/27/14  1:40 PM  Result Value Ref Range Status   Specimen Description URINE, RANDOM  Final   Special Requests Normal  Final   Colony Count   Final    >=100,000 COLONIES/ML Performed at [facility]    Culture   Final    Multiple bacterial morphotypes present, none predominant. Suggest appropriate recollection if clinically indicated. Performed at [facility]    Report Status 05/29/2014 FINAL  Final  Blood culture (routine x 2)     Status: None (Preliminary result)   Collection Time: 05/27/14  5:48 PM  Result Value Ref Range Status   Specimen Description BLOOD LAC  Final   Special Requests  Normal  Final   Culture   Final           BLOOD CULTURE RECEIVED NO GROWTH TO DATE CULTURE WILL BE HELD FOR 5 DAYS BEFORE ISSUING A FINAL NEGATIVE REPORT Performed at [facility]    Report Status PENDING  Incomplete  Blood culture (routine x 2)     Status: None (Preliminary result)   Collection Time: 05/27/14  5:55 PM  Result Value Ref Range Status   Specimen Description BLOOD RIGHT HAND  Final   Special Requests Normal  Final   Culture   Final           BLOOD CULTURE RECEIVED NO GROWTH  TO DATE CULTURE WILL BE HELD FOR 5 DAYS BEFORE ISSUING A FINAL NEGATIVE REPORT Performed at [facility]    Report Status PENDING  Incomplete  Clostridium Difficile by PCR     Status: None   Collection Time: 05/28/14  8:06 AM  Result Value Ref Range Status   C difficile by pcr NEGATIVE NEGATIVE Final     Labs: Basic Metabolic Panel:  Recent Labs Lab 05/27/14 1210 05/28/14 0540 06/01/14 1056  NA 138 138  --   K 4.1 4.1  --   CL 107 109  --   CO2 22 22  --   GLUCOSE 136* 110*  --   BUN 22 15  --   CREATININE 0.80 0.86  --   CALCIUM 8.7 8.5  --   MG  --   --  1.6   Liver Function Tests:  Recent Labs Lab 05/27/14 1210  AST 19  ALT 14  ALKPHOS 73  BILITOT 0.3  PROT 6.5  ALBUMIN 3.4*   No results for input(s): LIPASE, AMYLASE in the last 168 hours. No results for input(s): AMMONIA in the last 168 hours. CBC:  Recent Labs Lab 05/27/14 1210 05/28/14 0540  WBC 15.7* 8.1  NEUTROABS 14.3*  --   HGB 12.5 11.3*  HCT 39.6 35.8*  MCV 96.1 96.2  PLT 357 340   Cardiac Enzymes: No results for input(s): CKTOTAL, CKMB, CKMBINDEX, TROPONINI in the last 168 hours. BNP: BNP (last 3 results) No results for input(s): BNP in the last 8760 hours.  ProBNP (last 3 results) No results for input(s): PROBNP in the last 8760 hours.  CBG: No results for input(s): GLUCAP in the last 168 hours.     SignedRhetta Mura  [facility] 06/01/2014, 3:31  PM

## 2014-06-03 LAB — CULTURE, BLOOD (ROUTINE X 2)
Culture: NO GROWTH
Culture: NO GROWTH
SPECIAL REQUESTS: NORMAL
SPECIAL REQUESTS: NORMAL

## 2014-10-07 ENCOUNTER — Ambulatory Visit
Admission: RE | Admit: 2014-10-07 | Discharge: 2014-10-07 | Disposition: A | Discharge status: Home / Self Care | Payer: Medicare PPO | Source: Ambulatory Visit | Attending: Internal Medicine | Admitting: Internal Medicine

## 2014-10-07 ENCOUNTER — Other Ambulatory Visit: Payer: Self-pay | Admitting: Internal Medicine

## 2014-10-07 DIAGNOSIS — Q159 Congenital malformation of eye, unspecified: Secondary | ICD-10-CM

## 2015-02-03 ENCOUNTER — Emergency Department (HOSPITAL_COMMUNITY)
Admission: EM | Admit: 2015-02-03 | Discharge: 2015-02-04 | Disposition: A | Discharge status: Home / Self Care | Payer: Medicare PPO | Attending: Emergency Medicine | Admitting: Emergency Medicine

## 2015-02-03 ENCOUNTER — Encounter (HOSPITAL_COMMUNITY): Payer: Self-pay

## 2015-02-03 DIAGNOSIS — H409 Unspecified glaucoma: Secondary | ICD-10-CM | POA: Insufficient documentation

## 2015-02-03 DIAGNOSIS — R51 Headache: Secondary | ICD-10-CM | POA: Diagnosis present

## 2015-02-03 DIAGNOSIS — I10 Essential (primary) hypertension: Secondary | ICD-10-CM | POA: Diagnosis not present

## 2015-02-03 DIAGNOSIS — K219 Gastro-esophageal reflux disease without esophagitis: Secondary | ICD-10-CM | POA: Insufficient documentation

## 2015-02-03 DIAGNOSIS — Z9889 Other specified postprocedural states: Secondary | ICD-10-CM | POA: Diagnosis not present

## 2015-02-03 DIAGNOSIS — H5712 Ocular pain, left eye: Secondary | ICD-10-CM | POA: Diagnosis not present

## 2015-02-03 DIAGNOSIS — Z87891 Personal history of nicotine dependence: Secondary | ICD-10-CM | POA: Insufficient documentation

## 2015-02-03 DIAGNOSIS — M1990 Unspecified osteoarthritis, unspecified site: Secondary | ICD-10-CM | POA: Diagnosis not present

## 2015-02-03 DIAGNOSIS — Z880 Allergy status to penicillin: Secondary | ICD-10-CM | POA: Diagnosis not present

## 2015-02-03 DIAGNOSIS — Z79899 Other long term (current) drug therapy: Secondary | ICD-10-CM | POA: Insufficient documentation

## 2015-02-03 DIAGNOSIS — Z791 Long term (current) use of non-steroidal anti-inflammatories (NSAID): Secondary | ICD-10-CM | POA: Insufficient documentation

## 2015-02-03 DIAGNOSIS — R6883 Chills (without fever): Secondary | ICD-10-CM | POA: Insufficient documentation

## 2015-02-03 DIAGNOSIS — H40052 Ocular hypertension, left eye: Secondary | ICD-10-CM

## 2015-02-03 DIAGNOSIS — H4389 Other disorders of vitreous body: Secondary | ICD-10-CM | POA: Insufficient documentation

## 2015-02-03 MED ORDER — HYDROMORPHONE HCL 1 MG/ML IJ SOLN
0.5000 mg | Freq: Once | INTRAMUSCULAR | Status: AC
  Administered 2015-02-03: 0.5 mg via INTRAVENOUS
  Filled 2015-02-03: qty 1

## 2015-02-03 MED ORDER — FLUORESCEIN SODIUM 1 MG OP STRP
1.0000 | ORAL_STRIP | Freq: Once | OPHTHALMIC | Status: AC
  Administered 2015-02-03: 1 via OPHTHALMIC
  Filled 2015-02-03: qty 1

## 2015-02-03 MED ORDER — ONDANSETRON 8 MG PO TBDP
8.0000 mg | ORAL_TABLET | Freq: Once | ORAL | Status: AC
  Administered 2015-02-03: 8 mg via ORAL
  Filled 2015-02-03: qty 1

## 2015-02-03 MED ORDER — HYDROMORPHONE HCL 1 MG/ML IJ SOLN
0.5000 mg | Freq: Once | INTRAMUSCULAR | Status: DC
  Filled 2015-02-03: qty 1

## 2015-02-03 MED ORDER — TETRACAINE HCL 0.5 % OP SOLN
2.0000 [drp] | Freq: Once | OPHTHALMIC | Status: AC
  Administered 2015-02-03: 2 [drp] via OPHTHALMIC
  Filled 2015-02-03: qty 2

## 2015-02-03 MED ORDER — BRIMONIDINE TARTRATE 0.15 % OP SOLN
1.0000 [drp] | Freq: Once | OPHTHALMIC | Status: AC
  Administered 2015-02-04: 1 [drp] via OPHTHALMIC
  Filled 2015-02-03: qty 5

## 2015-02-03 MED ORDER — ACETAZOLAMIDE SODIUM 500 MG IJ SOLR
500.0000 mg | Freq: Once | INTRAMUSCULAR | Status: AC
  Administered 2015-02-04: 500 mg via INTRAVENOUS
  Filled 2015-02-03: qty 500

## 2015-02-03 MED ORDER — HYDROMORPHONE HCL 1 MG/ML IJ SOLN
0.5000 mg | Freq: Once | INTRAMUSCULAR | Status: DC
  Administered 2015-02-03: 0.5 mg via INTRAVENOUS

## 2015-02-03 MED ORDER — DORZOLAMIDE HCL-TIMOLOL MAL 2-0.5 % OP SOLN
1.0000 [drp] | Freq: Once | OPHTHALMIC | Status: AC
  Administered 2015-02-04: 1 [drp] via OPHTHALMIC
  Filled 2015-02-03: qty 10

## 2015-02-03 NOTE — ED Notes (Signed)
Bed: [facility] Expected date:  Expected time:  Means of arrival:  Comments: 

## 2015-02-03 NOTE — ED Provider Notes (Signed)
CSN: [id number]     Arrival date & time 02/03/15  1931 History   First MD Initiated Contact with Patient 02/03/15 2056     Chief Complaint  Patient presents with  . Facial Pain     (Consider location/radiation/quality/duration/timing/severity/associated sxs/prior Treatment) HPI  Alexis Yates is a 81 y.o. female, with a history of glaucoma, presenting to the ED with pain to her left eye and cheek that she woke up with this morning. Patient had glaucoma surgery by Dr. Dione Booze a week and a half ago and has been recovering as expected up until this morning. Patient states that the pain felt like a sinus infection so she was putting hot packs on the area, which would help while the heat was in place but the pain would return when he was taken away. Patient describes the pain as a pressure, rates it at a 10 out of 10, nonradiating. Patient takes oxycodone at home for purse surgical pain her last dose at 5 PM today, but states that today the pain medicine has not controlled her pain. Patient has not tried anything else for the pain. Patient denies visual loss or changes, headache, dizziness, fever, nausea or vomiting, or any other pain or complaints. Patient is accompanied at the bedside by her 2 daughters.    Past Medical History  Diagnosis Date  . Hypertension   . Arthritis   . Hiatal hernia   . Diverticulosis   . Lung nodules     Right  . Atherosclerosis of aorta (HCC)   . Scoliosis   . Glaucoma   . GERD (gastroesophageal reflux disease)    Past Surgical History  Procedure Laterality Date  . Fracture surgery      Tibia/fibula of right leg  . Abdominal hysterectomy    . Eye surgery     Family History  Problem Relation Age of Onset  . Cancer Brother     Stomach  . Heart disease Father     Died age 45  . Diabetes Mother   . Heart failure Mother     Died 36   Social History  Substance Use Topics  . Smoking status: Former Games developer  . Smokeless tobacco: None  . Alcohol Use: No    OB History    No data available     Review of Systems  Constitutional: Positive for chills.  HENT:       Pain to the left eye and cheek  All other systems reviewed and are negative.     Allergies  Penicillins and Phenergan  Home Medications   Prior to Admission medications   Medication Sig Start Date End Date Taking? Authorizing Provider  ALPRAZolam Prudy Feeler) 0.5 MG tablet Take 0.25 mg by mouth 2 (two) times daily as needed for anxiety.    Yes Historical Provider, MD  bimatoprost (LUMIGAN) 0.01 % SOLN Place 1 drop into both eyes at bedtime.   Yes Historical Provider, MD  brimonidine (ALPHAGAN P) 0.1 % SOLN Place 1 drop into both eyes 2 (two) times daily.   Yes Historical Provider, MD  calcium-vitamin D 250-100 MG-UNIT per tablet Take 1 tablet by mouth 2 (two) times daily.   Yes Historical Provider, MD  celecoxib (CELEBREX) 200 MG capsule Take 200 mg by mouth every morning.    Yes Historical Provider, MD  citalopram (CELEXA) 20 MG tablet Take 20 mg by mouth daily.   Yes Historical Provider, MD  docusate sodium (COLACE) 100 MG capsule Take 100 mg by mouth  at bedtime.   Yes Historical Provider, MD  dorzolamide (TRUSOPT) 2 % ophthalmic solution Place 1 drop into both eyes 2 (two) times daily.   Yes Historical Provider, MD  lansoprazole (PREVACID) 30 MG capsule Take 30 mg by mouth every morning.    Yes Historical Provider, MD  lisinopril (PRINIVIL,ZESTRIL) 20 MG tablet Take 10 mg by mouth daily.    Yes Historical Provider, MD  ofloxacin (OCUFLOX) 0.3 % ophthalmic solution Place 1 drop into the left eye 4 (four) times daily. 01/08/15  Yes Historical Provider, MD  oxyCODONE (OXY IR/ROXICODONE) 5 MG immediate release tablet Take 5 mg by mouth 3 (three) times daily.    Yes Historical Provider, MD  oxymorphone (OPANA ER) 10 MG T12A 12 hr tablet Take 10 mg by mouth every 12 (twelve) hours.   Yes Historical Provider, MD  Polyethyl Glycol-Propyl Glycol (SYSTANE OP) Place 1 drop into both eyes 2  (two) times daily.   Yes Historical Provider, MD  prednisoLONE acetate (PRED FORTE) 1 % ophthalmic suspension Place 1 drop into the left eye 4 (four) times daily. 01/08/15  Yes Historical Provider, MD  Vitamin D, Cholecalciferol, 1000 UNITS TABS Take 1,000 Units by mouth daily.   Yes Historical Provider, MD   BP 129/63 mmHg  Pulse 58  Temp(Src) 98.8 F (37.1 C) (Rectal)  Resp 14  SpO2 96% Physical Exam  Constitutional: She appears well-developed and well-nourished. No distress.  HENT:  Head: Normocephalic and atraumatic.  Eyes: Conjunctivae and EOM are normal. Pupils are equal, round, and reactive to light.  Some scleral injection noted in the left eye. Eye pressures in the left eye were 48 x3 times, in the right eye 22, 20, 23. Fluorescene uptake noted in the left cornea, but no specific lesions were found on slit lamp exam.   Visual Acuity  Right Eye Distance:   Left Eye Distance:   Bilateral Distance:    Right Eye Near: R Near: 20/400 Left Eye Near:  L Near: 0 Bilateral Near:  20/400 Patient can discern light and dark, fingers held up, and movement in the left eye and states this is consistent with her abilities since the surgery.  Neck: Normal range of motion. Neck supple.  Cardiovascular: Normal rate, regular rhythm and normal heart sounds.   Pulmonary/Chest: Effort normal and breath sounds normal. No respiratory distress.  Abdominal: Soft. Bowel sounds are normal.  Musculoskeletal: She exhibits no edema or tenderness.  Lymphadenopathy:    She has no cervical adenopathy.  Neurological: She is alert.  Skin: Skin is warm and dry. She is not diaphoretic.  Nursing note and vitals reviewed.   ED Course  Procedures (including critical care time) Labs Review Labs Reviewed - No data to display  Imaging Review No results found. I have personally reviewed and evaluated these images and lab results as part of my medical decision-making.   EKG Interpretation None       MDM   Final diagnoses:  Increased intraocular pressure, left  Eye pain, left  H/O eye surgery    Alexis Yates presents with facial pain following glaucoma surgery week and half ago.  Findings and plan of care discussed with Benjiman Core, MD.  Plan to treat patient's pain, test the intraocular pressure, and an consult ophthalmology if necessary. Since patient's pressures are increasing left eye, ophthalmology consult is indicated. Dr. Rubin Payor to handle ophthalmology consult. 11:32 PM  Dr. Rubin Payor spoke with Dr. Edrick Oh, ophthalmologist on call. Diamox  IV as well as three  separate drops. If no decrease of eye pressure below 35, will need to call Dr. Edrick Oh back. Plan to control patient's pain as best as we can while waiting for the eye pressure decrease. 2:14 AM upon reevaluation of patient's intraocular pressures, the pressure in the left eye was found to be 31, 32, 30. Plan to reevaluate patient's intraocular pressures in another couple hours, to avoid releasing patient and having her pressures rise again. End of shift patient care handoff report given to [facility], PA-C. Filed Vitals:   02/04/15 0130 02/04/15 0200 02/04/15 0217 02/04/15 0230  BP: 123/61 119/65 119/65 129/63  Pulse: 62 56 56 58  Temp:      TempSrc:      Resp:   14   SpO2: 99% 98% 100% 96%     Anselm Pancoast, PA-C 02/04/15 0253  Benjiman Core, MD 02/10/15 [phone]

## 2015-02-03 NOTE — ED Notes (Signed)
Pt had glaucoma surgery one week ago and today she complains of pain under her left eye around her sinuses on the surgical side

## 2015-02-04 MED ORDER — PROPARACAINE HCL 0.5 % OP SOLN
1.0000 [drp] | Freq: Once | OPHTHALMIC | Status: AC
  Administered 2015-02-04: 1 [drp] via OPHTHALMIC
  Filled 2015-02-04: qty 15

## 2015-02-04 MED ORDER — HYDROMORPHONE HCL 1 MG/ML IJ SOLN
1.0000 mg | Freq: Once | INTRAMUSCULAR | Status: AC
  Administered 2015-02-04: 1 mg via INTRAVENOUS
  Filled 2015-02-04: qty 1

## 2015-02-04 MED ORDER — PILOCARPINE HCL 2 % OP SOLN
1.0000 [drp] | Freq: Once | OPHTHALMIC | Status: AC
  Administered 2015-02-04: 1 [drp] via OPHTHALMIC
  Filled 2015-02-04: qty 15

## 2015-02-04 NOTE — ED Provider Notes (Signed)
Care transferred from [facility], [place].  Alexis Yates is a 81 y.o. female presents to the ED with left eye pain.  Patient reports a history of glaucoma and last week had glaucoma surgery by Dr. Dione Booze at the surgery center. Patient reports that they did not use a laser and she had sutures in her eye. She has had blurred vision since the surgery but had increased pain tonight is presenting to the emergency department. On initial exam her intraocular pressure was 48. The patient was discussed with Dr. Edrick Oh the ophthalmologist on call as Dr. Dione Booze was unable to be reached. Patient was subsequently given IV Diamox and several eyedrops. Pt with 10/10 pain to the left eye.  Physical Exam  BP 163/78 mmHg  Pulse 66  Temp(Src) 98.8 F (37.1 C) (Rectal)  Resp 14  SpO2 100%  Physical Exam   Face to face Exam:   General: Awake  HEENT: Atraumatic. Mild ecchymosis of the left periorbital tissues.    Resp: Normal effort  Abd: Nondistended  Neuro:No focal weakness  Lymph: No adenopathy    ED Course  Procedures  1. Increased intraocular pressure, left   2. Eye pain, left   3. H/O eye surgery    MDM  Plan: Complete Diomox infusion and recheck IOP two hours after completion.  If IOP is < 35 plan for d/c home with f/u tomorrow morning.  If IOP is > 35, consult back to Dr. Edrick Oh.    3:51 AM Tono:  15 in the left eye.  Patient is continuing to have pain. Dilaudid redosed. Discussed importance of follow-up with Dr. Dione Booze or Dr. Edrick Oh in the morning. If her symptoms worsen she is to return immediately here to the emergency department. Patient and daughter state understanding.  BP 114/59 mmHg  Pulse 58  Temp(Src) 98.8 F (37.1 C) (Rectal)  Resp 16  SpO2 96%    Dierdre Forth, PA-C 02/04/15 3343  Benjiman Core, MD 02/10/15 [phone]

## 2015-02-04 NOTE — ED Notes (Signed)
MD at bedside. 

## 2015-02-04 NOTE — Discharge Instructions (Signed)
You have been seen today for eye pain following a surgical procedure. Your lab tests showed no abnormalities. He will need to follow up with Dr. Laruth Bouchard office as soon as possible this morning. Follow up with PCP as needed. Return to ED should symptoms worsen.

## 2015-02-04 NOTE — ED Notes (Signed)
Patient is resting comfortably. Family at bedside.  

## 2015-02-04 NOTE — ED Notes (Signed)
Eye pressure in lt eye and face post glaucoma surgery on Monday. Lt eye redness and clear drainage. Family at bedside. Pt alert and oriented x 4.

## 2015-09-10 ENCOUNTER — Other Ambulatory Visit: Payer: Self-pay | Admitting: Internal Medicine

## 2015-09-10 ENCOUNTER — Ambulatory Visit
Admission: RE | Admit: 2015-09-10 | Discharge: 2015-09-10 | Disposition: A | Discharge status: Home / Self Care | Payer: Medicare Other | Source: Ambulatory Visit | Attending: Internal Medicine | Admitting: Internal Medicine

## 2015-09-10 DIAGNOSIS — R918 Other nonspecific abnormal finding of lung field: Secondary | ICD-10-CM

## 2016-01-08 ENCOUNTER — Emergency Department (HOSPITAL_COMMUNITY): Payer: Medicare Other

## 2016-01-08 ENCOUNTER — Inpatient Hospital Stay (HOSPITAL_COMMUNITY)
Admission: EM | Admit: 2016-01-08 | Discharge: 2016-01-13 | DRG: 247 | Disposition: A | Discharge status: Skilled Nursing Facility | Payer: Medicare Other | Attending: Internal Medicine | Admitting: Internal Medicine

## 2016-01-08 ENCOUNTER — Encounter (HOSPITAL_COMMUNITY): Payer: Self-pay | Admitting: Emergency Medicine

## 2016-01-08 DIAGNOSIS — R918 Other nonspecific abnormal finding of lung field: Secondary | ICD-10-CM | POA: Diagnosis present

## 2016-01-08 DIAGNOSIS — M1711 Unilateral primary osteoarthritis, right knee: Secondary | ICD-10-CM | POA: Diagnosis present

## 2016-01-08 DIAGNOSIS — K219 Gastro-esophageal reflux disease without esophagitis: Secondary | ICD-10-CM | POA: Diagnosis present

## 2016-01-08 DIAGNOSIS — Z888 Allergy status to other drugs, medicaments and biological substances status: Secondary | ICD-10-CM

## 2016-01-08 DIAGNOSIS — Z87891 Personal history of nicotine dependence: Secondary | ICD-10-CM

## 2016-01-08 DIAGNOSIS — H409 Unspecified glaucoma: Secondary | ICD-10-CM | POA: Diagnosis present

## 2016-01-08 DIAGNOSIS — I2510 Atherosclerotic heart disease of native coronary artery without angina pectoris: Secondary | ICD-10-CM | POA: Diagnosis present

## 2016-01-08 DIAGNOSIS — I272 Pulmonary hypertension, unspecified: Secondary | ICD-10-CM | POA: Diagnosis present

## 2016-01-08 DIAGNOSIS — R8299 Other abnormal findings in urine: Secondary | ICD-10-CM | POA: Diagnosis present

## 2016-01-08 DIAGNOSIS — M419 Scoliosis, unspecified: Secondary | ICD-10-CM | POA: Diagnosis present

## 2016-01-08 DIAGNOSIS — I700 Atherosclerosis of aorta: Secondary | ICD-10-CM | POA: Diagnosis present

## 2016-01-08 DIAGNOSIS — J9811 Atelectasis: Secondary | ICD-10-CM | POA: Diagnosis present

## 2016-01-08 DIAGNOSIS — Z880 Allergy status to penicillin: Secondary | ICD-10-CM | POA: Diagnosis not present

## 2016-01-08 DIAGNOSIS — N390 Urinary tract infection, site not specified: Secondary | ICD-10-CM | POA: Diagnosis present

## 2016-01-08 DIAGNOSIS — R011 Cardiac murmur, unspecified: Secondary | ICD-10-CM | POA: Diagnosis not present

## 2016-01-08 DIAGNOSIS — Z800 Family history of malignant neoplasm of digestive organs: Secondary | ICD-10-CM

## 2016-01-08 DIAGNOSIS — I2119 ST elevation (STEMI) myocardial infarction involving other coronary artery of inferior wall: Principal | ICD-10-CM | POA: Diagnosis present

## 2016-01-08 DIAGNOSIS — I214 Non-ST elevation (NSTEMI) myocardial infarction: Secondary | ICD-10-CM | POA: Diagnosis not present

## 2016-01-08 DIAGNOSIS — R531 Weakness: Secondary | ICD-10-CM | POA: Diagnosis present

## 2016-01-08 DIAGNOSIS — Z7952 Long term (current) use of systemic steroids: Secondary | ICD-10-CM | POA: Diagnosis not present

## 2016-01-08 DIAGNOSIS — Z791 Long term (current) use of non-steroidal anti-inflammatories (NSAID): Secondary | ICD-10-CM | POA: Diagnosis not present

## 2016-01-08 DIAGNOSIS — Z833 Family history of diabetes mellitus: Secondary | ICD-10-CM

## 2016-01-08 DIAGNOSIS — Z79899 Other long term (current) drug therapy: Secondary | ICD-10-CM | POA: Diagnosis not present

## 2016-01-08 DIAGNOSIS — Z792 Long term (current) use of antibiotics: Secondary | ICD-10-CM | POA: Diagnosis not present

## 2016-01-08 DIAGNOSIS — K449 Diaphragmatic hernia without obstruction or gangrene: Secondary | ICD-10-CM | POA: Diagnosis present

## 2016-01-08 DIAGNOSIS — Z79891 Long term (current) use of opiate analgesic: Secondary | ICD-10-CM

## 2016-01-08 DIAGNOSIS — Z8249 Family history of ischemic heart disease and other diseases of the circulatory system: Secondary | ICD-10-CM

## 2016-01-08 DIAGNOSIS — I10 Essential (primary) hypertension: Secondary | ICD-10-CM | POA: Diagnosis present

## 2016-01-08 DIAGNOSIS — I2584 Coronary atherosclerosis due to calcified coronary lesion: Secondary | ICD-10-CM | POA: Diagnosis present

## 2016-01-08 DIAGNOSIS — Z960 Presence of urogenital implants: Secondary | ICD-10-CM | POA: Diagnosis present

## 2016-01-08 DIAGNOSIS — I340 Nonrheumatic mitral (valve) insufficiency: Secondary | ICD-10-CM | POA: Diagnosis present

## 2016-01-08 HISTORY — DX: Atherosclerotic heart disease of native coronary artery without angina pectoris: I25.10

## 2016-01-08 LAB — URINALYSIS, ROUTINE W REFLEX MICROSCOPIC
Bilirubin Urine: NEGATIVE
Glucose, UA: NEGATIVE mg/dL
HGB URINE DIPSTICK: NEGATIVE
Ketones, ur: NEGATIVE mg/dL
NITRITE: NEGATIVE
PROTEIN: NEGATIVE mg/dL
SPECIFIC GRAVITY, URINE: 1.017 (ref 1.005–1.030)
pH: 8 (ref 5.0–8.0)

## 2016-01-08 LAB — TROPONIN I: TROPONIN I: 11.76 ng/mL — AB (ref <0.03)

## 2016-01-08 LAB — BASIC METABOLIC PANEL
ANION GAP: 7 (ref 5–15)
BUN: 17 mg/dL (ref 6–20)
CALCIUM: 8.9 mg/dL (ref 8.9–10.3)
CO2: 25 mmol/L (ref 22–32)
CREATININE: 0.67 mg/dL (ref 0.44–1.00)
Chloride: 104 mmol/L (ref 101–111)
Glucose, Bld: 130 mg/dL — ABNORMAL HIGH (ref 65–99)
Potassium: 3.9 mmol/L (ref 3.5–5.1)
SODIUM: 136 mmol/L (ref 135–145)

## 2016-01-08 LAB — CBC
HCT: 37.7 % (ref 36.0–46.0)
HEMOGLOBIN: 12.3 g/dL (ref 12.0–15.0)
MCH: 30.7 pg (ref 26.0–34.0)
MCHC: 32.6 g/dL (ref 30.0–36.0)
MCV: 94 fL (ref 78.0–100.0)
PLATELETS: 217 10*3/uL (ref 150–400)
RBC: 4.01 MIL/uL (ref 3.87–5.11)
RDW: 13.9 % (ref 11.5–15.5)
WBC: 13.1 10*3/uL — AB (ref 4.0–10.5)

## 2016-01-08 LAB — URINE MICROSCOPIC-ADD ON

## 2016-01-08 MED ORDER — ASPIRIN 81 MG PO CHEW
324.0000 mg | CHEWABLE_TABLET | Freq: Once | ORAL | Status: AC
  Administered 2016-01-08: 324 mg via ORAL
  Filled 2016-01-08: qty 4

## 2016-01-08 MED ORDER — SODIUM CHLORIDE 0.9 % IV BOLUS (SEPSIS)
500.0000 mL | Freq: Once | INTRAVENOUS | Status: AC
  Administered 2016-01-08: 500 mL via INTRAVENOUS

## 2016-01-08 NOTE — ED Notes (Signed)
EKG given to EDP,[place]elverton,MD., for review. 

## 2016-01-08 NOTE — ED Triage Notes (Signed)
Pt is c/o general weakness  Pt states it is an effort to even get up and walk  Pt states she has been sleeping a lot and has no energy  Pt states she has been having heartburn since yesterday afternoon  Pt states she has been using galvascon with some relief

## 2016-01-08 NOTE — ED Notes (Signed)
Pt's family told this RN that they believe their mother has been throwing up her food.  Hx of bulimia. Does not want to mention this in front of family.

## 2016-01-08 NOTE — ED Provider Notes (Signed)
[facility]-EMERGENCY DEPT Provider Note   CSN: [id number] Arrival date & time: 01/08/16  1910     History   Chief Complaint Chief Complaint  Patient presents with  . Weakness    HPI Alexis Yates is a 82 y.o. female. She presents for evaluation of weakness. She states her symptoms started last night as a burning sensation of her chest. She states it felt like "a match" she took some Gaviscon. Really didn't seem to get better. Was intermittently they're through the night. By this morning is essentially resolved. She has been pain-free since 9 or 10 this morning. However she states that she is profoundly weak to the point that even given her typical ADLs at home were difficult today. She otherwise functions well. Lives at home.  No history of cardiac disease. Lifetime nonsmoker.  HPI  Past Medical History:  Diagnosis Date  . Arthritis   . Atherosclerosis of aorta (HCC)   . Diverticulosis   . GERD (gastroesophageal reflux disease)   . Glaucoma   . Hiatal hernia   . Hypertension   . Lung nodules    Right  . Scoliosis     Patient Active Problem List   Diagnosis Date Noted  . NSTEMI (non-ST elevated myocardial infarction) (HCC) 01/08/2016  . PID (acute pelvic inflammatory disease) 05/27/2014  . Vaginal inflammation from pessary (HCC) 05/27/2014  . Leukocytosis 05/27/2014  . Nausea vomiting and diarrhea 05/27/2014  . Hypertension   . Hiatal hernia   . Lung nodules   . Glaucoma   . GERD (gastroesophageal reflux disease)     Past Surgical History:  Procedure Laterality Date  . ABDOMINAL HYSTERECTOMY    . EYE SURGERY    . FRACTURE SURGERY     Tibia/fibula of right leg    OB History    No data available       Home Medications    Prior to Admission medications   Medication Sig Start Date End Date Taking? Authorizing Provider  ALPRAZolam Prudy Feeler(XANAX) 0.5 MG tablet Take 0.25 mg by mouth 2 (two) times daily as needed for anxiety.    Yes Historical Provider, MD    bimatoprost (LUMIGAN) 0.01 % SOLN Place 1 drop into the left eye at bedtime.    Yes Historical Provider, MD  brimonidine (ALPHAGAN P) 0.1 % SOLN Place 1 drop into both eyes 2 (two) times daily.   Yes Historical Provider, MD  calcium-vitamin D 250-100 MG-UNIT per tablet Take 1 tablet by mouth 2 (two) times daily.   Yes Historical Provider, MD  celecoxib (CELEBREX) 200 MG capsule Take 200 mg by mouth 2 (two) times daily.    Yes Historical Provider, MD  citalopram (CELEXA) 20 MG tablet Take 20 mg by mouth every evening.    Yes Historical Provider, MD  docusate sodium (COLACE) 100 MG capsule Take 100 mg by mouth 2 (two) times daily.    Yes Historical Provider, MD  dorzolamide (TRUSOPT) 2 % ophthalmic solution Place 1 drop into both eyes 2 (two) times daily.   Yes Historical Provider, MD  lansoprazole (PREVACID) 30 MG capsule Take 30 mg by mouth every morning.    Yes Historical Provider, MD  lisinopril (PRINIVIL,ZESTRIL) 20 MG tablet Take 10 mg by mouth daily.    Yes Historical Provider, MD  oxyCODONE (OXY IR/ROXICODONE) 5 MG immediate release tablet Take 5 mg by mouth 3 (three) times daily as needed for severe pain or breakthrough pain (pain).    Yes Historical Provider, MD  oxymorphone (OPANA  ER) 10 MG T12A 12 hr tablet Take 10 mg by mouth every 12 (twelve) hours.   Yes Historical Provider, MD  Polyethyl Glycol-Propyl Glycol (SYSTANE OP) Place 1 drop into both eyes 2 (two) times daily.   Yes Historical Provider, MD  prednisoLONE acetate (PRED FORTE) 1 % ophthalmic suspension Place 1 drop into the left eye 2 (two) times daily.  01/08/15  Yes Historical Provider, MD  Vitamin D, Cholecalciferol, 1000 UNITS TABS Take 1,000 Units by mouth daily.   Yes Historical Provider, MD  ofloxacin (OCUFLOX) 0.3 % ophthalmic solution Place 1 drop into the left eye 4 (four) times daily. 01/08/15   Historical Provider, MD    Family History Family History  Problem Relation Age of Onset  . Cancer Brother     Stomach   . Heart disease Father     Died age 14  . Diabetes Mother   . Heart failure Mother     Died 9    Social History Social History  Substance Use Topics  . Smoking status: Former Games developer  . Smokeless tobacco: Never Used  . Alcohol use No     Allergies   Penicillins and Phenergan [promethazine hcl]   Review of Systems Review of Systems  Constitutional: Negative for appetite change, chills, diaphoresis, fatigue and fever.  HENT: Negative for mouth sores, sore throat and trouble swallowing.   Eyes: Negative for visual disturbance.  Respiratory: Negative for cough, chest tightness, shortness of breath and wheezing.   Cardiovascular: Positive for chest pain.  Gastrointestinal: Positive for nausea. Negative for abdominal distention, abdominal pain, diarrhea and vomiting.  Endocrine: Negative for polydipsia, polyphagia and polyuria.  Genitourinary: Negative for dysuria, frequency and hematuria.  Musculoskeletal: Negative for gait problem.  Skin: Negative for color change, pallor and rash.  Neurological: Positive for weakness. Negative for dizziness, syncope, light-headedness and headaches.  Hematological: Does not bruise/bleed easily.  Psychiatric/Behavioral: Negative for behavioral problems and confusion.     Physical Exam Updated Vital Signs BP 133/100   Pulse 77   Temp 98.4 F (36.9 C) (Oral)   Resp 20   SpO2 95%   Physical Exam  Constitutional: She is oriented to person, place, and time. She appears well-developed and well-nourished. No distress.  HENT:  Head: Normocephalic.  Eyes: Conjunctivae are normal. Pupils are equal, round, and reactive to light. No scleral icterus.  Neck: Normal range of motion. Neck supple. No thyromegaly present.  Cardiovascular: Normal rate and regular rhythm.  Exam reveals no gallop and no friction rub.   No murmur heard. Pulmonary/Chest: Effort normal and breath sounds normal. No respiratory distress. She has no wheezes. She has no  rales.  Clear lungs. Normal bilateral breath sounds. No dependent edema.  Abdominal: Soft. Bowel sounds are normal. She exhibits no distension. There is no tenderness. There is no rebound.  Musculoskeletal: Normal range of motion.  Neurological: She is alert and oriented to person, place, and time.  Skin: Skin is warm and dry. No rash noted.  Psychiatric: She has a normal mood and affect. Her behavior is normal.     ED Treatments / Results  Labs (all labs ordered are listed, but only abnormal results are displayed) Labs Reviewed  BASIC METABOLIC PANEL - Abnormal; Notable for the following:       Result Value   Glucose, Bld 130 (*)    All other components within normal limits  CBC - Abnormal; Notable for the following:    WBC 13.1 (*)    All  other components within normal limits  URINALYSIS, ROUTINE W REFLEX MICROSCOPIC (NOT AT [facility]) - Abnormal; Notable for the following:    APPearance CLOUDY (*)    Leukocytes, UA MODERATE (*)    All other components within normal limits  TROPONIN I - Abnormal; Notable for the following:    Troponin I 11.76 (*)    All other components within normal limits  URINE MICROSCOPIC-ADD ON - Abnormal; Notable for the following:    Squamous Epithelial / LPF 0-5 (*)    Bacteria, UA FEW (*)    All other components within normal limits  URINE CULTURE    EKG  EKG Interpretation  Date/Time:  Thursday January 08 2016 19:20:03 EDT Ventricular Rate:  79 PR Interval:    QRS Duration: 104 QT Interval:  366 QTC Calculation: 417 R Axis:   -6 Text Interpretation:  Sinus rhythm Consider right atrial enlargement Borderline repolarization abnormality Artifact in lead(s) I II III aVR aVL aVF V1 V2 V3 V4 V5 V6 Confirmed by Fayrene Fearing  MD, Andrew Soria ([id number]) on 01/08/2016 8:56:48 PM       Radiology Dg Chest 2 View  Result Date: 01/08/2016 CLINICAL DATA:  Shortness of breath and mid chest pain for 24 hours EXAM: CHEST  2 VIEW COMPARISON:  September 10, 2015 FINDINGS: The  mediastinal contour is normal. The heart size is enlarged. Patchy opacity is identified in the right mid lung field. There is no pulmonary edema or pleural effusion. The visualized skeletal structures are stable. IMPRESSION: Patchy consolidation identified in the right mid lung field, pneumonia is not excluded. Electronically Signed   By: Sherian Rein M.D.   On: 01/08/2016 21:48    Procedures Procedures (including critical care time)  Medications Ordered in ED Medications  sodium chloride 0.9 % bolus 500 mL (0 mLs Intravenous Stopped 01/08/16 2159)  aspirin chewable tablet 324 mg (324 mg Oral Given 01/08/16 2348)     Initial Impression / Assessment and Plan / ED Course  I have reviewed the triage vital signs and the nursing notes.  Pertinent labs & imaging results that were available during my care of the patient were reviewed by me and considered in my medical decision making (see chart for details).  Clinical Course    Area of atelectasis versus infiltrate right midlung. Presented ptosis 13.1. She is not hypoxemic or febrile. She has no pleuritic discomfort. She has no chest pain now. She states at one point last night the pain radiated to her shoulder and her job but have not been present all day today. EKG does not show acute or ischemic changes. No Q waves or ST changes. Her troponin is elevated at 11.1. She was given aspirin. I discussed the case with Dr. Tenny Craw of cardiology. Dr. Tenny Craw return my call immediately. She immediately requested admit the patient to her service and transferred to [facility] for further testing and ricks risk stratification. This was explained in detail the patient and her family. They are comfortable with the above arrangements.  Final Clinical Impressions(s) / ED Diagnoses   Final diagnoses:  NSTEMI (non-ST elevated myocardial infarction) [facility])    New Prescriptions New Prescriptions   No medications on file     Rolland Porter, MD 01/09/16 0001

## 2016-01-08 NOTE — ED Notes (Signed)
MD at bedside. 

## 2016-01-09 ENCOUNTER — Encounter (HOSPITAL_COMMUNITY)
Admission: EM | Disposition: A | Discharge status: Skilled Nursing Facility | Payer: Self-pay | Source: Home / Self Care | Attending: Internal Medicine

## 2016-01-09 ENCOUNTER — Encounter (HOSPITAL_COMMUNITY): Payer: Self-pay | Admitting: Cardiology

## 2016-01-09 DIAGNOSIS — I214 Non-ST elevation (NSTEMI) myocardial infarction: Secondary | ICD-10-CM

## 2016-01-09 DIAGNOSIS — I2119 ST elevation (STEMI) myocardial infarction involving other coronary artery of inferior wall: Secondary | ICD-10-CM | POA: Diagnosis present

## 2016-01-09 DIAGNOSIS — I219 Acute myocardial infarction, unspecified: Secondary | ICD-10-CM

## 2016-01-09 DIAGNOSIS — I2510 Atherosclerotic heart disease of native coronary artery without angina pectoris: Secondary | ICD-10-CM

## 2016-01-09 DIAGNOSIS — N390 Urinary tract infection, site not specified: Secondary | ICD-10-CM

## 2016-01-09 DIAGNOSIS — I10 Essential (primary) hypertension: Secondary | ICD-10-CM

## 2016-01-09 HISTORY — DX: Acute myocardial infarction, unspecified: I21.9

## 2016-01-09 HISTORY — PX: CARDIAC CATHETERIZATION: SHX172

## 2016-01-09 LAB — BASIC METABOLIC PANEL
Anion gap: 5 (ref 5–15)
BUN: 11 mg/dL (ref 6–20)
CHLORIDE: 108 mmol/L (ref 101–111)
CO2: 26 mmol/L (ref 22–32)
CREATININE: 0.71 mg/dL (ref 0.44–1.00)
Calcium: 8.8 mg/dL — ABNORMAL LOW (ref 8.9–10.3)
GFR calc Af Amer: >60 mL/min (ref >60)
GFR calc non Af Amer: >60 mL/min (ref >60)
GLUCOSE: 108 mg/dL — AB (ref 65–99)
POTASSIUM: 3.9 mmol/L (ref 3.5–5.1)
SODIUM: 139 mmol/L (ref 135–145)

## 2016-01-09 LAB — CBC
HCT: 35.2 % — ABNORMAL LOW (ref 36.0–46.0)
HCT: 31.3 % — ABNORMAL LOW (ref 36.0–46.0)
HEMOGLOBIN: 11.3 g/dL — AB (ref 12.0–15.0)
Hemoglobin: 10 g/dL — ABNORMAL LOW (ref 12.0–15.0)
MCH: 30.2 pg (ref 26.0–34.0)
MCH: 30.6 pg (ref 26.0–34.0)
MCHC: 32.1 g/dL (ref 30.0–36.0)
MCHC: 31.9 g/dL (ref 30.0–36.0)
MCV: 94.1 fL (ref 78.0–100.0)
MCV: 95.7 fL (ref 78.0–100.0)
PLATELETS: 183 10*3/uL (ref 150–400)
PLATELETS: 147 10*3/uL — AB (ref 150–400)
RBC: 3.74 MIL/uL — AB (ref 3.87–5.11)
RBC: 3.27 MIL/uL — ABNORMAL LOW (ref 3.87–5.11)
RDW: 13.9 % (ref 11.5–15.5)
RDW: 14.5 % (ref 11.5–15.5)
WBC: 10.5 10*3/uL (ref 4.0–10.5)
WBC: 6.3 10*3/uL (ref 4.0–10.5)

## 2016-01-09 LAB — PROTIME-INR
INR: 1.18
Prothrombin Time: 15.1 seconds (ref 11.4–15.2)

## 2016-01-09 LAB — TROPONIN I: TROPONIN I: 12.75 ng/mL — AB (ref <0.03)

## 2016-01-09 LAB — TSH: TSH: 1.467 u[IU]/mL (ref 0.350–4.500)

## 2016-01-09 LAB — POCT ACTIVATED CLOTTING TIME: Activated Clotting Time: 428 seconds

## 2016-01-09 LAB — LIPID PANEL
CHOL/HDL RATIO: 2 ratio
CHOLESTEROL: 148 mg/dL (ref 0–200)
HDL: 75 mg/dL (ref >40)
LDL Cholesterol: 69 mg/dL (ref 0–99)
Triglycerides: 21 mg/dL (ref <150)
VLDL: 4 mg/dL (ref 0–40)

## 2016-01-09 LAB — MRSA PCR SCREENING: MRSA BY PCR: NEGATIVE

## 2016-01-09 SURGERY — LEFT HEART CATH AND CORONARY ANGIOGRAPHY
Anesthesia: LOCAL

## 2016-01-09 MED ORDER — ACETAMINOPHEN 325 MG PO TABS: 650.0000 mg | ORAL_TABLET | ORAL | Status: DC | PRN

## 2016-01-09 MED ORDER — SODIUM CHLORIDE 0.9 % IV SOLN
250.0000 mL | INTRAVENOUS | Status: DC | PRN
Start: 2016-01-09 — End: 2016-01-09

## 2016-01-09 MED ORDER — CLOPIDOGREL BISULFATE 75 MG PO TABS
75.0000 mg | ORAL_TABLET | Freq: Every day | ORAL | Status: DC
  Administered 2016-01-10 – 2016-01-13 (×4): 75 mg via ORAL
  Filled 2016-01-09 (×4): qty 1

## 2016-01-09 MED ORDER — IOPAMIDOL (ISOVUE-370) INJECTION 76%
INTRAVENOUS | Status: AC
  Filled 2016-01-09: qty 100

## 2016-01-09 MED ORDER — HEPARIN (PORCINE) IN NACL 2-0.9 UNIT/ML-% IJ SOLN
INTRAMUSCULAR | Status: DC | PRN
  Administered 2016-01-09: 1000 mL

## 2016-01-09 MED ORDER — TICAGRELOR 90 MG PO TABS: 90.0000 mg | ORAL_TABLET | Freq: Two times a day (BID) | ORAL | Status: DC

## 2016-01-09 MED ORDER — SODIUM CHLORIDE 0.9 % IV SOLN
INTRAVENOUS | Status: DC
Start: 2016-01-09 — End: 2016-01-09
  Administered 2016-01-09: 05:00:00 via INTRAVENOUS

## 2016-01-09 MED ORDER — HEPARIN (PORCINE) IN NACL 2-0.9 UNIT/ML-% IJ SOLN
INTRAMUSCULAR | Status: AC
  Filled 2016-01-09: qty 1500

## 2016-01-09 MED ORDER — DORZOLAMIDE HCL 2 % OP SOLN
1.0000 [drp] | Freq: Two times a day (BID) | OPHTHALMIC | Status: DC
  Filled 2016-01-09: qty 10

## 2016-01-09 MED ORDER — NITROGLYCERIN 1 MG/10 ML FOR IR/CATH LAB
INTRA_ARTERIAL | Status: AC
  Filled 2016-01-09: qty 10

## 2016-01-09 MED ORDER — CLOPIDOGREL BISULFATE 75 MG PO TABS: 75.0000 mg | ORAL_TABLET | Freq: Once | ORAL | Status: DC

## 2016-01-09 MED ORDER — BRIMONIDINE TARTRATE 0.2 % OP SOLN
1.0000 [drp] | Freq: Two times a day (BID) | OPHTHALMIC | Status: DC
  Administered 2016-01-09 – 2016-01-10 (×3): 1 [drp] via OPHTHALMIC

## 2016-01-09 MED ORDER — ALPRAZOLAM 0.25 MG PO TABS
0.2500 mg | ORAL_TABLET | Freq: Two times a day (BID) | ORAL | Status: DC | PRN
  Administered 2016-01-09: 0.25 mg via ORAL
  Filled 2016-01-09: qty 1

## 2016-01-09 MED ORDER — ACETAMINOPHEN 325 MG PO TABS
650.0000 mg | ORAL_TABLET | ORAL | Status: DC | PRN
  Administered 2016-01-09: 650 mg via ORAL
  Filled 2016-01-09: qty 2

## 2016-01-09 MED ORDER — ASPIRIN 81 MG PO CHEW
81.0000 mg | CHEWABLE_TABLET | ORAL | Status: AC
  Administered 2016-01-09: 81 mg via ORAL
  Filled 2016-01-09: qty 1

## 2016-01-09 MED ORDER — BIVALIRUDIN BOLUS VIA INFUSION - CUPID
INTRAVENOUS | Status: DC | PRN
  Administered 2016-01-09: 39.675 mg via INTRAVENOUS

## 2016-01-09 MED ORDER — LIDOCAINE HCL (PF) 1 % IJ SOLN
INTRAMUSCULAR | Status: DC | PRN
  Administered 2016-01-09: 5 mL

## 2016-01-09 MED ORDER — SODIUM CHLORIDE 0.9 % IV BOLUS (SEPSIS)
250.0000 mL | Freq: Once | INTRAVENOUS | Status: AC
  Administered 2016-01-09: 250 mL via INTRAVENOUS

## 2016-01-09 MED ORDER — ASPIRIN EC 81 MG PO TBEC
81.0000 mg | DELAYED_RELEASE_TABLET | Freq: Every day | ORAL | Status: DC
  Administered 2016-01-10 – 2016-01-13 (×4): 81 mg via ORAL
  Filled 2016-01-09 (×4): qty 1

## 2016-01-09 MED ORDER — TICAGRELOR 90 MG PO TABS: 180.0000 mg | ORAL_TABLET | Freq: Once | ORAL | Status: DC

## 2016-01-09 MED ORDER — SODIUM CHLORIDE 0.9% FLUSH: 3.0000 mL | INTRAVENOUS | Status: DC | PRN

## 2016-01-09 MED ORDER — METOPROLOL TARTRATE 12.5 MG HALF TABLET
12.5000 mg | ORAL_TABLET | Freq: Two times a day (BID) | ORAL | Status: DC
  Administered 2016-01-09 – 2016-01-13 (×9): 12.5 mg via ORAL
  Filled 2016-01-09 (×9): qty 1

## 2016-01-09 MED ORDER — ATORVASTATIN CALCIUM 80 MG PO TABS: 80.0000 mg | ORAL_TABLET | Freq: Every day | ORAL | Status: DC

## 2016-01-09 MED ORDER — LATANOPROST 0.005 % OP SOLN
1.0000 [drp] | Freq: Every day | OPHTHALMIC | Status: DC
  Filled 2016-01-09: qty 2.5

## 2016-01-09 MED ORDER — ONDANSETRON HCL 4 MG/2ML IJ SOLN: 4.0000 mg | Freq: Four times a day (QID) | INTRAMUSCULAR | Status: DC | PRN

## 2016-01-09 MED ORDER — ONDANSETRON HCL 4 MG/2ML IJ SOLN
4.0000 mg | Freq: Four times a day (QID) | INTRAMUSCULAR | Status: DC | PRN
  Administered 2016-01-09: 4 mg via INTRAVENOUS
  Filled 2016-01-09: qty 2

## 2016-01-09 MED ORDER — SODIUM CHLORIDE 0.9% FLUSH
3.0000 mL | Freq: Two times a day (BID) | INTRAVENOUS | Status: DC
  Administered 2016-01-09: 3 mL via INTRAVENOUS

## 2016-01-09 MED ORDER — NITROGLYCERIN 0.4 MG SL SUBL: 0.4000 mg | SUBLINGUAL_TABLET | SUBLINGUAL | Status: DC | PRN

## 2016-01-09 MED ORDER — LATANOPROST 0.005 % OP SOLN
1.0000 [drp] | Freq: Every day | OPHTHALMIC | Status: DC
  Administered 2016-01-09: 1 [drp] via OPHTHALMIC

## 2016-01-09 MED ORDER — FENTANYL CITRATE (PF) 100 MCG/2ML IJ SOLN
INTRAMUSCULAR | Status: DC | PRN
  Administered 2016-01-09: 12.5 ug via INTRAVENOUS
  Administered 2016-01-09: 25 ug via INTRAVENOUS

## 2016-01-09 MED ORDER — CIPROFLOXACIN HCL 500 MG PO TABS
500.0000 mg | ORAL_TABLET | Freq: Two times a day (BID) | ORAL | Status: DC
  Administered 2016-01-09 – 2016-01-13 (×9): 500 mg via ORAL
  Filled 2016-01-09 (×9): qty 1

## 2016-01-09 MED ORDER — PREDNISOLONE ACETATE 1 % OP SUSP
1.0000 [drp] | Freq: Two times a day (BID) | OPHTHALMIC | Status: DC
  Administered 2016-01-09 – 2016-01-10 (×3): 1 [drp] via OPHTHALMIC

## 2016-01-09 MED ORDER — SODIUM CHLORIDE 0.9 % IV SOLN
INTRAVENOUS | Status: DC | PRN
  Administered 2016-01-09: 1.75 mg/kg/h via INTRAVENOUS

## 2016-01-09 MED ORDER — VERAPAMIL HCL 2.5 MG/ML IV SOLN
INTRAVENOUS | Status: AC
  Filled 2016-01-09: qty 2

## 2016-01-09 MED ORDER — CLOPIDOGREL BISULFATE 300 MG PO TABS
ORAL_TABLET | ORAL | Status: DC | PRN
  Administered 2016-01-09: 600 mg via ORAL

## 2016-01-09 MED ORDER — MORPHINE SULFATE (PF) 2 MG/ML IV SOLN
1.0000 mg | INTRAVENOUS | Status: DC | PRN
  Administered 2016-01-09 – 2016-01-10 (×2): 1 mg via INTRAVENOUS
  Filled 2016-01-09 (×2): qty 1

## 2016-01-09 MED ORDER — OXYCODONE HCL 5 MG PO TABS: 5.0000 mg | ORAL_TABLET | Freq: Three times a day (TID) | ORAL | Status: DC | PRN

## 2016-01-09 MED ORDER — FAMOTIDINE IN NACL 20-0.9 MG/50ML-% IV SOLN
INTRAVENOUS | Status: DC | PRN
Start: 2016-01-09 — End: 2016-01-09
  Administered 2016-01-09: 20 mg via INTRAVENOUS

## 2016-01-09 MED ORDER — ATROPINE SULFATE 1 MG/10ML IJ SOSY
PREFILLED_SYRINGE | INTRAMUSCULAR | Status: AC
  Filled 2016-01-09: qty 10

## 2016-01-09 MED ORDER — PREDNISOLONE ACETATE 1 % OP SUSP
1.0000 [drp] | Freq: Two times a day (BID) | OPHTHALMIC | Status: DC
  Filled 2016-01-09: qty 1

## 2016-01-09 MED ORDER — OXYCODONE HCL 5 MG PO TABS
5.0000 mg | ORAL_TABLET | Freq: Three times a day (TID) | ORAL | Status: DC | PRN
Start: 2016-01-09 — End: 2016-01-13
  Administered 2016-01-09 – 2016-01-13 (×12): 5 mg via ORAL
  Filled 2016-01-09 (×12): qty 1

## 2016-01-09 MED ORDER — HEPARIN BOLUS VIA INFUSION
3000.0000 [IU] | Freq: Once | INTRAVENOUS | Status: AC
  Administered 2016-01-09: 3000 [IU] via INTRAVENOUS
  Filled 2016-01-09: qty 3000

## 2016-01-09 MED ORDER — ALPRAZOLAM 0.25 MG PO TABS
0.2500 mg | ORAL_TABLET | Freq: Two times a day (BID) | ORAL | Status: DC | PRN
  Administered 2016-01-09 – 2016-01-11 (×5): 0.25 mg via ORAL
  Filled 2016-01-09 (×5): qty 1

## 2016-01-09 MED ORDER — ASPIRIN 81 MG PO CHEW: 324.0000 mg | CHEWABLE_TABLET | ORAL | Status: DC

## 2016-01-09 MED ORDER — MIDAZOLAM HCL 2 MG/2ML IJ SOLN
INTRAMUSCULAR | Status: AC
  Filled 2016-01-09: qty 2

## 2016-01-09 MED ORDER — CLOPIDOGREL BISULFATE 300 MG PO TABS
ORAL_TABLET | ORAL | Status: AC
  Filled 2016-01-09: qty 2

## 2016-01-09 MED ORDER — ASPIRIN 300 MG RE SUPP: 300.0000 mg | RECTAL | Status: DC

## 2016-01-09 MED ORDER — LIDOCAINE HCL (PF) 1 % IJ SOLN
INTRAMUSCULAR | Status: AC
  Filled 2016-01-09: qty 30

## 2016-01-09 MED ORDER — SODIUM CHLORIDE 0.9 % IV SOLN
INTRAVENOUS | Status: AC
  Administered 2016-01-09: 15:00:00 via INTRAVENOUS

## 2016-01-09 MED ORDER — HEPARIN (PORCINE) IN NACL 100-0.45 UNIT/ML-% IJ SOLN
600.0000 [IU]/h | INTRAMUSCULAR | Status: DC
  Administered 2016-01-09: 600 [IU]/h via INTRAVENOUS
  Filled 2016-01-09: qty 250

## 2016-01-09 MED ORDER — ATORVASTATIN CALCIUM 80 MG PO TABS
80.0000 mg | ORAL_TABLET | Freq: Every day | ORAL | Status: DC
  Administered 2016-01-09 – 2016-01-11 (×3): 80 mg via ORAL
  Filled 2016-01-09 (×3): qty 1

## 2016-01-09 MED ORDER — ASPIRIN EC 81 MG PO TBEC: 81.0000 mg | DELAYED_RELEASE_TABLET | Freq: Every day | ORAL | Status: DC

## 2016-01-09 MED ORDER — SODIUM CHLORIDE 0.9 % IV SOLN
250.0000 mL | INTRAVENOUS | Status: DC | PRN
Start: 2016-01-09 — End: 2016-01-13

## 2016-01-09 MED ORDER — FAMOTIDINE IN NACL 20-0.9 MG/50ML-% IV SOLN
INTRAVENOUS | Status: AC
  Filled 2016-01-09: qty 50

## 2016-01-09 MED ORDER — BIVALIRUDIN 250 MG IV SOLR
INTRAVENOUS | Status: AC
  Filled 2016-01-09: qty 250

## 2016-01-09 MED ORDER — MIDAZOLAM HCL 2 MG/2ML IJ SOLN
INTRAMUSCULAR | Status: DC | PRN
Start: 2016-01-09 — End: 2016-01-09
  Administered 2016-01-09 (×2): 1 mg via INTRAVENOUS

## 2016-01-09 MED ORDER — SODIUM CHLORIDE 0.9 % WEIGHT BASED INFUSION: 3.0000 mL/kg/h | INTRAVENOUS | Status: AC

## 2016-01-09 MED ORDER — IOPAMIDOL (ISOVUE-370) INJECTION 76%
INTRAVENOUS | Status: DC | PRN
  Administered 2016-01-09: 285 mL via INTRA_ARTERIAL

## 2016-01-09 MED ORDER — CITALOPRAM HYDROBROMIDE 20 MG PO TABS: 20.0000 mg | ORAL_TABLET | Freq: Every evening | ORAL | Status: DC

## 2016-01-09 MED ORDER — FENTANYL CITRATE (PF) 100 MCG/2ML IJ SOLN
INTRAMUSCULAR | Status: AC
  Filled 2016-01-09: qty 2

## 2016-01-09 MED ORDER — SODIUM CHLORIDE 0.9% FLUSH
3.0000 mL | Freq: Two times a day (BID) | INTRAVENOUS | Status: DC
  Administered 2016-01-09 – 2016-01-13 (×6): 3 mL via INTRAVENOUS

## 2016-01-09 MED ORDER — NITROGLYCERIN 1 MG/10 ML FOR IR/CATH LAB
INTRA_ARTERIAL | Status: DC | PRN
  Administered 2016-01-09: 200 ug via INTRACORONARY

## 2016-01-09 MED ORDER — ACETAMINOPHEN 325 MG PO TABS
650.0000 mg | ORAL_TABLET | ORAL | Status: DC | PRN
  Administered 2016-01-11: 650 mg via ORAL
  Filled 2016-01-09: qty 2

## 2016-01-09 MED ORDER — METOPROLOL TARTRATE 12.5 MG HALF TABLET: 12.5000 mg | ORAL_TABLET | Freq: Two times a day (BID) | ORAL | Status: DC

## 2016-01-09 MED ORDER — BRIMONIDINE TARTRATE 0.2 % OP SOLN
1.0000 [drp] | Freq: Two times a day (BID) | OPHTHALMIC | Status: DC
Start: 2016-01-09 — End: 2016-01-09
  Filled 2016-01-09: qty 5

## 2016-01-09 MED ORDER — CITALOPRAM HYDROBROMIDE 20 MG PO TABS
20.0000 mg | ORAL_TABLET | Freq: Every evening | ORAL | Status: DC
  Administered 2016-01-09 – 2016-01-13 (×5): 20 mg via ORAL
  Filled 2016-01-09 (×5): qty 1

## 2016-01-09 MED ORDER — SODIUM CHLORIDE 0.9% FLUSH
3.0000 mL | Freq: Two times a day (BID) | INTRAVENOUS | Status: DC
  Administered 2016-01-09 – 2016-01-13 (×5): 3 mL via INTRAVENOUS

## 2016-01-09 MED ORDER — DORZOLAMIDE HCL 2 % OP SOLN
1.0000 [drp] | Freq: Two times a day (BID) | OPHTHALMIC | Status: DC
  Administered 2016-01-09 – 2016-01-13 (×9): 1 [drp] via OPHTHALMIC

## 2016-01-09 SURGICAL SUPPLY — 26 items
BALLN MINITREK RX 2.0X12 (BALLOONS) ×2
BALLN MINITREK RX 2.0X20 (BALLOONS) ×2
BALLOON MINITREK RX 2.0X12 (BALLOONS) ×1 IMPLANT
BALLOON MINITREK RX 2.0X20 (BALLOONS) ×1 IMPLANT
CATH INFINITI 5FR MULTPACK ANG (CATHETERS) ×2 IMPLANT
CATH VISTA GUIDE 6FR 3DRC (CATHETERS) ×2 IMPLANT
GLIDESHEATH SLEND A-KIT 6F 22G (SHEATH) ×2 IMPLANT
GLIDESHEATH SLEND SS 6F .021 (SHEATH) ×2 IMPLANT
GUIDE CATH RUNWAY 6FR AR1 (CATHETERS) ×2 IMPLANT
GUIDE CATH RUNWAY 6FR FR4 (CATHETERS) ×2 IMPLANT
GUIDE CATH RUNWAY 6FR HS (CATHETERS) ×2 IMPLANT
GUIDEWIRE 3MM J TIP .035 145 (WIRE) ×2 IMPLANT
KIT ENCORE 26 ADVANTAGE (KITS) ×2 IMPLANT
KIT HEART LEFT (KITS) ×2 IMPLANT
PACK CARDIAC CATHETERIZATION (CUSTOM PROCEDURE TRAY) ×2 IMPLANT
PINNACLE LONG 6F 25CM (SHEATH) ×2
SHEATH INTRO PINNACLE 6F 25CM (SHEATH) ×1 IMPLANT
SHEATH PINNACLE 5F 10CM (SHEATH) ×2 IMPLANT
SHEATH PINNACLE 6F 10CM (SHEATH) ×2 IMPLANT
SHEATH PINNACLE 7F 10CM (SHEATH) ×2 IMPLANT
STENT PROMUS PREM MR 2.25X28 (Permanent Stent) ×2 IMPLANT
SYR MEDRAD MARK V 150ML (SYRINGE) ×2 IMPLANT
TRANSDUCER W/STOPCOCK (MISCELLANEOUS) ×2 IMPLANT
TUBING CIL FLEX 10 FLL-RA (TUBING) ×2 IMPLANT
WIRE HI TORQ BMW 190CM (WIRE) ×2 IMPLANT
WIRE SAFE-T 1.5MM-J .035X260CM (WIRE) ×2 IMPLANT

## 2016-01-09 NOTE — H&P (Signed)
CARDIOLOGY H&P   Reason for Admit: NSTEMI  HPI:  Alexis Yates is an 82 y/o woman with HTN, GERD, glaucoma and severe bladder prolapse managed with a pessary who is admitted for NSTEMI.  She denies a history of known CAD. She presented to [facility] ER earlier this evening with weakness and a burning sensation in her chest. Says the pain actually began on Wednesday afternoon when she was in Dr. Nilsa Nutting office being evaluated for her R knee osteoarthritis. The pain subsided for a bit then cam back early in the evening and was quite severe with radiation into her left arm. She took Gaviscon without relief. Woke up this morning with mild pian which waxed and waned throughout the day. She finally came to the ER because she felt profoundly weak and was unable to do ADLs. Last chest pain was at 6pm  In ER, ECG with NSR 79 with severe baseline artifact. Possible inferior q-waves and TWI. Troponin 11.76. ER physician discussed case with Dr. Tenny Craw and patient transferred to [facility] for admission. Currently pain free.   Denies dyspnea, syncope or presyncope. + dysuria. No fevers or chills.   UA+ moderate leukocytes   Review of Systems:     Cardiac Review of Systems: {Y] = yes [ ]  = no  Chest Pain [  y  ]  Resting SOB [   ] Exertional SOB  [  ]  Orthopnea [  ]   Pedal Edema [   ]    Palpitations [  ] Syncope  [  ]   Presyncope [   ]  General Review of Systems: [Y] = yes [  ]=no Constitional: recent weight change [  ]; anorexia [  ]; fatigue [ y ]; nausea [  ]; night sweats [  ]; fever [  ]; or chills [  ];               Dental: poor dentition[  ];   Eye : blurred vision [  ]; diplopia [   ]; vision changes [  ];  Amaurosis fugax[  ]; Resp: cough [  ];  wheezing[  ];  hemoptysis[  ]; shortness of breath[  ]; paroxysmal nocturnal dyspnea[  ]; dyspnea on exertion[  ]; or orthopnea[  ];  GI:  gallstones[  ], vomiting[  ];  dysphagia[  ]; melena[  ];  hematochezia [  ]; heartburn[ y ];   GU: kidney stones [  ];  hematuria[  ];   dysuria [  ];  nocturia[  ];               Skin: rash [  ], swelling[  ];, hair loss[  ];  peripheral edema[  ];  or itching[  ]; Musculosketetal: myalgias[  ];  joint swelling[  ];  joint erythema[  ];  joint pain[ y ];  back pain[  ];  Heme/Lymph: bruising[  ];  bleeding[  ];  anemia[  ];  Neuro: TIA[  ];  headaches[  ];  stroke[  ];  vertigo[  ];  seizures[  ];   paresthesias[  ];  difficulty walking[  ];  Psych:depression[y  ]; anxiety[  ];  Endocrine: diabetes[  ];  thyroid dysfunction[  ];  Other:  Past Medical History:  Diagnosis Date  . Arthritis   . Atherosclerosis of aorta (HCC)   . Diverticulosis   . GERD (gastroesophageal reflux disease)   . Glaucoma   . Hiatal hernia   .  Hypertension   . Lung nodules    Right  . Scoliosis     Prior to Admission medications   Medication Sig Start Date End Date Taking? Authorizing Provider  ALPRAZolam Prudy Feeler(XANAX) 0.5 MG tablet Take 0.25 mg by mouth 2 (two) times daily as needed for anxiety.    Yes Historical Provider, MD  bimatoprost (LUMIGAN) 0.01 % SOLN Place 1 drop into the left eye at bedtime.    Yes Historical Provider, MD  brimonidine (ALPHAGAN P) 0.1 % SOLN Place 1 drop into both eyes 2 (two) times daily.   Yes Historical Provider, MD  calcium-vitamin D 250-100 MG-UNIT per tablet Take 1 tablet by mouth 2 (two) times daily.   Yes Historical Provider, MD  celecoxib (CELEBREX) 200 MG capsule Take 200 mg by mouth 2 (two) times daily.    Yes Historical Provider, MD  citalopram (CELEXA) 20 MG tablet Take 20 mg by mouth every evening.    Yes Historical Provider, MD  docusate sodium (COLACE) 100 MG capsule Take 100 mg by mouth 2 (two) times daily.    Yes Historical Provider, MD  dorzolamide (TRUSOPT) 2 % ophthalmic solution Place 1 drop into both eyes 2 (two) times daily.   Yes Historical Provider, MD  lansoprazole (PREVACID) 30 MG capsule Take 30 mg by mouth every morning.    Yes Historical Provider, MD  lisinopril  (PRINIVIL,ZESTRIL) 20 MG tablet Take 10 mg by mouth daily.    Yes Historical Provider, MD  oxyCODONE (OXY IR/ROXICODONE) 5 MG immediate release tablet Take 5 mg by mouth 3 (three) times daily as needed for severe pain or breakthrough pain (pain).    Yes Historical Provider, MD  oxymorphone (OPANA ER) 10 MG T12A 12 hr tablet Take 10 mg by mouth every 12 (twelve) hours.   Yes Historical Provider, MD  Polyethyl Glycol-Propyl Glycol (SYSTANE OP) Place 1 drop into both eyes 2 (two) times daily.   Yes Historical Provider, MD  prednisoLONE acetate (PRED FORTE) 1 % ophthalmic suspension Place 1 drop into the left eye 2 (two) times daily.  01/08/15  Yes Historical Provider, MD  Vitamin D, Cholecalciferol, 1000 UNITS TABS Take 1,000 Units by mouth daily.   Yes Historical Provider, MD  ofloxacin (OCUFLOX) 0.3 % ophthalmic solution Place 1 drop into the left eye 4 (four) times daily. 01/08/15   Historical Provider, MD     Allergies  Allergen Reactions  . Penicillins Anaphylaxis  . Phenergan [Promethazine Hcl]     Restless legs    Social History   Social History  . Marital status: Widowed    Spouse name: N/A  . Number of children: 2  . Years of education: N/A   Occupational History  . Retired from school system    Social History Main Topics  . Smoking status: Former Games developermoker  . Smokeless tobacco: Never Used  . Alcohol use No  . Drug use: No  . Sexual activity: Not on file   Other Topics Concern  . Not on file   Social History Narrative   Widowed.  Lives alone.  Ambulates with a cane.    Family History  Problem Relation Age of Onset  . Cancer Brother     Stomach  . Heart disease Father     Died age 80  . Diabetes Mother   . Heart failure Mother     Died [id number]    PHYSICAL EXAM: Vitals:   01/09/16 0034 01/09/16 0135  BP: 105/69 107/64  Pulse: 86 79  Resp: 18   Temp: 99 F (37.2 C)    General:  Elderly Well appearing. No respiratory difficulty HEENT: normal Neck: supple. no  JVP 7 Carotids 2+ bilat; no bruits. No lymphadenopathy or thryomegaly appreciated. Cor: PMI nondisplaced. Regular rate & rhythm. No rubs, gallops or murmurs. Lungs: clear Abdomen: soft, nontender, nondistended. No hepatosplenomegaly. No bruits or masses. Good bowel sounds. Extremities: no cyanosis, clubbing, rash, edema Neuro: alert & oriented x 3, cranial nerves grossly intact. moves all 4 extremities w/o difficulty. Affect pleasant.  ECG: NSR 79 severe baseline artifact probable inferior Q waves and TWI  Results for orders placed or performed during the hospital encounter of 01/08/16 (from the past 24 hour(s))  Basic metabolic panel     Status: Abnormal   Collection Time: 01/08/16  8:26 PM  Result Value Ref Range   Sodium 136 135 - 145 mmol/L   Potassium 3.9 3.5 - 5.1 mmol/L   Chloride 104 101 - 111 mmol/L   CO2 25 22 - 32 mmol/L   Glucose, Bld 130 (H) 65 - 99 mg/dL   BUN 17 6 - 20 mg/dL   Creatinine, Ser 9.35 0.44 - 1.00 mg/dL   Calcium 8.9 8.9 - 70.1 mg/dL   GFR calc non Af Amer >60 >60 mL/min   GFR calc Af Amer >60 >60 mL/min   Anion gap 7 5 - 15  CBC     Status: Abnormal   Collection Time: 01/08/16  8:26 PM  Result Value Ref Range   WBC 13.1 (H) 4.0 - 10.5 K/uL   RBC 4.01 3.87 - 5.11 MIL/uL   Hemoglobin 12.3 12.0 - 15.0 g/dL   HCT 77.9 39.0 - 30.0 %   MCV 94.0 78.0 - 100.0 fL   MCH 30.7 26.0 - 34.0 pg   MCHC 32.6 30.0 - 36.0 g/dL   RDW 92.3 30.0 - 76.2 %   Platelets 217 150 - 400 K/uL  Troponin I     Status: Abnormal   Collection Time: 01/08/16  8:26 PM  Result Value Ref Range   Troponin I 11.76 (HH) <0.03 ng/mL  Urinalysis, Routine w reflex microscopic     Status: Abnormal   Collection Time: 01/08/16 10:00 PM  Result Value Ref Range   Color, Urine YELLOW YELLOW   APPearance CLOUDY (A) CLEAR   Specific Gravity, Urine 1.017 1.005 - 1.030   pH 8.0 5.0 - 8.0   Glucose, UA NEGATIVE NEGATIVE mg/dL   Hgb urine dipstick NEGATIVE NEGATIVE   Bilirubin Urine NEGATIVE  NEGATIVE   Ketones, ur NEGATIVE NEGATIVE mg/dL   Protein, ur NEGATIVE NEGATIVE mg/dL   Nitrite NEGATIVE NEGATIVE   Leukocytes, UA MODERATE (A) NEGATIVE  Urine microscopic-add on     Status: Abnormal   Collection Time: 01/08/16 10:00 PM  Result Value Ref Range   Squamous Epithelial / LPF 0-5 (A) NONE SEEN   WBC, UA 6-30 0 - 5 WBC/hpf   RBC / HPF 0-5 0 - 5 RBC/hpf   Bacteria, UA FEW (A) NONE SEEN   Dg Chest 2 View  Result Date: 01/08/2016 CLINICAL DATA:  Shortness of breath and mid chest pain for 24 hours EXAM: CHEST  2 VIEW COMPARISON:  September 10, 2015 FINDINGS: The mediastinal contour is normal. The heart size is enlarged. Patchy opacity is identified in the right mid lung field. There is no pulmonary edema or pleural effusion. The visualized skeletal structures are stable. IMPRESSION: Patchy consolidation identified in the right mid lung field, pneumonia is not  excluded. Electronically Signed   By: Sherian Rein M.D.   On: 01/08/2016 21:48     ASSESSMENT: 1. Out-of-hospital inferolateral STEMI 2. HTN - well controlled 3. Probable UTI 4. Severe bladder prolapse managed with pessary 5. GERD  PLAN/DISCUSSION:  Ms. Beagley is an 82 y/o woman with no previous cardiac history who is now admitted with ACS > 24 hours out from symptom onset. She is currently pain free. ECG is of limited quality but suggest possible OOH inferior infarct. Will repeat ECG treat with ASA, heparin, b-blocker and statin. Plan cath later today (I discussed risks/indications with her and her family). Consult CR. Check echo for RV involvement.   She has h/o severe bladder prolapse managed with pessary. UA concerning for UTI. Will treat with ceftriaxone x 3 days. Await culture.   Demeco Ducksworth,MD 2:35 AM   Addendum:  I repeated ECG (initial ECG with baseline artifact). Repeat ECG shows evolving inferolateral STEMI with inferior q-waves and mild ST elevation. As patient is now pain free and > 24 hours out from  initial symptoms will not activate code STEMI.   Uzziah Rigg,MD 2:55 AM

## 2016-01-09 NOTE — Progress Notes (Addendum)
Pharmacy Antibiotic Note  Alexis Yates is a 82 y.o. female admitted on 01/08/2016 with UTI.  Pharmacy has been consulted for Cipro dosing. Pt here for NSTEMI. Severe bladder prolapse with likely UTI.   Plan: -Cipro 500 mg PO BID -F/U cultures for directed therapy  Height: 5' (152.4 cm) Weight: 116 lb 10 oz (52.9 kg) IBW/kg (Calculated) : 45.5  Temp (24hrs), Avg:98.7 F (37.1 C), Min:98.4 F (36.9 C), Max:99 F (37.2 C)   Recent Labs Lab 01/08/16 2026  WBC 13.1*  CREATININE 0.67    Estimated Creatinine Clearance: 36.3 mL/min (by C-G formula based on SCr of 0.67 mg/dL).    Allergies  Allergen Reactions  . Penicillins Anaphylaxis  . Phenergan [Promethazine Hcl]     Restless legs    Abran Duke 01/09/2016 3:28 AM

## 2016-01-09 NOTE — Progress Notes (Signed)
Laverda Page NP notified of patient's BP of 83/48 post- cath. Pt has arterial sheath in place. Alert and oriented, no mental status changes. Pt remained asymptomatic. bolus x2 ordered and given. BP remains 80s-90s. NP ordered to pull sheath.    Sheath pulled with cath lab at bedside transparent dressing applied. No issues. Vital signs stable. Site is level 0. Pt will remain on bedrest until 1930.

## 2016-01-09 NOTE — Care Management Note (Addendum)
Case Management Note  Patient Details  Name: Alexis Yates MRN: [id number] Date of Birth: 30-Mar-1927  Subjective/Objective:        Adm w mi            Action/Plan: was living at home, chart states had housekeeping help   Expected Discharge Date:                  Expected Discharge Plan:    In-House Referral:     Discharge planning Services     Post Acute Care Choice:    Choice offered to:     DME Arranged:    DME Agency:     HH Arranged:    [place] Agency:     Status of Service:  In process, will continue to follow  If discussed at Long Length of Stay Meetings, dates discussed:    Additional Comments: just back cath lab, will be on plavix. Met w pt and da. phy there and card rehab eval. fam hoping for short term snf for couple of weeks. Made sw ref. Explained would have to wait for phy there for rec for ins comp. fam may be interested in out of pocket for couple of weeks if ins does not cover. Will cont to follow.  Lacretia Leigh, RN 01/09/2016, 11:22 AM

## 2016-01-09 NOTE — Interval H&P Note (Signed)
History and Physical Interval Note:  01/09/2016 7:53 AM  Alexis Yates  has presented today for surgery, with the diagnosis of NSTEMI.  The various methods of treatment have been discussed with the patient and family. After consideration of risks, benefits and other options for treatment, the patient has consented to  Procedure(s): Left Heart Cath and Coronary Angiography (N/A) as a surgical intervention .  The patient's history has been reviewed, patient examined, no change in status, stable for surgery.  I have reviewed the patient's chart and labs.  Questions were answered to the patient's satisfaction.     Cath Lab Visit (complete for each Cath Lab visit)  Clinical Evaluation Leading to the Procedure:   ACS: Yes.    Non-ACS:    Anginal Classification: CCS IV  Anti-ischemic medical therapy: Minimal Therapy (1 class of medications)  Non-Invasive Test Results: No non-invasive testing performed  Prior CABG: No previous CABG   Bryan Lemma

## 2016-01-09 NOTE — ED Notes (Signed)
Gave report to [facility]   

## 2016-01-09 NOTE — Progress Notes (Signed)
ANTICOAGULATION CONSULT NOTE - Initial Consult  Pharmacy Consult for Heparin  Indication: NSTEMI  Allergies  Allergen Reactions  . Penicillins Anaphylaxis  . Phenergan [Promethazine Hcl]     Restless legs   Patient Measurements: Height: 5' (152.4 cm) Weight: 116 lb 10 oz (52.9 kg) IBW/kg (Calculated) : 45.5  Vital Signs: Temp: 98.6 F (37 C) (10/27 0320) Temp Source: Oral (10/27 0320) BP: 121/67 (10/27 0320) Pulse Rate: 73 (10/27 0320)  Labs:  Recent Labs  01/08/16 2026  HGB 12.3  HCT 37.7  PLT 217  CREATININE 0.67  TROPONINI 11.76*    Estimated Creatinine Clearance: 36.3 mL/min (by C-G formula based on SCr of 0.67 mg/dL).   Medical History: Past Medical History:  Diagnosis Date  . Arthritis   . Atherosclerosis of aorta (HCC)   . Diverticulosis   . GERD (gastroesophageal reflux disease)   . Glaucoma   . Hiatal hernia   . Hypertension   . Lung nodules    Right  . Scoliosis     Assessment: 82 y/o F with NSTEMI, troponin elevated, likely cath later today, starting heparin, CBC good, renal function good, PTA meds reviewed.   Goal of Therapy:  Heparin level 0.3-0.7 units/ml Monitor platelets by anticoagulation protocol: Yes   Plan:  -Heparin 3000 units BOLUS -Start heparin drip at 600 units/hr -1200 HL -Daily CBC/HL -Monitor for bleeding  Alexis Yates 01/09/2016,3:40 AM

## 2016-01-10 DIAGNOSIS — R011 Cardiac murmur, unspecified: Secondary | ICD-10-CM

## 2016-01-10 LAB — CBC
HCT: 34.3 % — ABNORMAL LOW (ref 36.0–46.0)
Hemoglobin: 10.8 g/dL — ABNORMAL LOW (ref 12.0–15.0)
MCH: 30.2 pg (ref 26.0–34.0)
MCHC: 31.5 g/dL (ref 30.0–36.0)
MCV: 95.8 fL (ref 78.0–100.0)
PLATELETS: 164 10*3/uL (ref 150–400)
RBC: 3.58 MIL/uL — AB (ref 3.87–5.11)
RDW: 14.3 % (ref 11.5–15.5)
WBC: 8.6 10*3/uL (ref 4.0–10.5)

## 2016-01-10 LAB — BASIC METABOLIC PANEL
ANION GAP: 5 (ref 5–15)
BUN: 9 mg/dL (ref 6–20)
CALCIUM: 8.8 mg/dL — AB (ref 8.9–10.3)
CHLORIDE: 112 mmol/L — AB (ref 101–111)
CO2: 23 mmol/L (ref 22–32)
CREATININE: 0.83 mg/dL (ref 0.44–1.00)
GFR calc non Af Amer: >60 mL/min (ref >60)
Glucose, Bld: 113 mg/dL — ABNORMAL HIGH (ref 65–99)
Potassium: 3.8 mmol/L (ref 3.5–5.1)
SODIUM: 140 mmol/L (ref 135–145)

## 2016-01-10 LAB — URINE CULTURE

## 2016-01-10 LAB — POCT ACTIVATED CLOTTING TIME
ACTIVATED CLOTTING TIME: 180 s
Activated Clotting Time: 164 seconds

## 2016-01-10 MED ORDER — ALUM & MAG HYDROXIDE-SIMETH 200-200-20 MG/5ML PO SUSP
15.0000 mL | ORAL | Status: DC | PRN
  Administered 2016-01-10 – 2016-01-11 (×3): 15 mL via ORAL
  Filled 2016-01-10 (×3): qty 30

## 2016-01-10 MED ORDER — MAGNESIUM HYDROXIDE 400 MG/5ML PO SUSP
30.0000 mL | Freq: Every day | ORAL | Status: DC | PRN
  Administered 2016-01-10 – 2016-01-13 (×2): 30 mL via ORAL
  Filled 2016-01-10 (×3): qty 30

## 2016-01-10 MED ORDER — BIMATOPROST 0.01 % OP SOLN
1.0000 [drp] | Freq: Every day | OPHTHALMIC | Status: DC
  Administered 2016-01-10 – 2016-01-12 (×3): 1 [drp] via OPHTHALMIC

## 2016-01-10 MED ORDER — PANTOPRAZOLE SODIUM 40 MG PO TBEC
40.0000 mg | DELAYED_RELEASE_TABLET | Freq: Every day | ORAL | Status: DC
  Filled 2016-01-10: qty 1

## 2016-01-10 MED ORDER — PANTOPRAZOLE SODIUM 40 MG PO TBEC
40.0000 mg | DELAYED_RELEASE_TABLET | Freq: Every day | ORAL | Status: DC
  Administered 2016-01-10: 40 mg via ORAL
  Filled 2016-01-10: qty 1

## 2016-01-10 MED ORDER — LISINOPRIL 2.5 MG PO TABS
2.5000 mg | ORAL_TABLET | Freq: Every day | ORAL | Status: DC
  Administered 2016-01-11 – 2016-01-12 (×2): 2.5 mg via ORAL
  Filled 2016-01-10 (×3): qty 1

## 2016-01-10 MED ORDER — PREDNISOLONE ACETATE 1 % OP SUSP
1.0000 [drp] | Freq: Two times a day (BID) | OPHTHALMIC | Status: DC
  Administered 2016-01-10 – 2016-01-13 (×6): 1 [drp] via OPHTHALMIC

## 2016-01-10 NOTE — Progress Notes (Signed)
Patient ID: Alexis Yates, female   DOB: April 25, 1927, 82 y.o.   MRN: [id number]   SUBJECTIVE: No dyspnea, no chest pain.  Has not been out of bed yet.    LHC yesterday: DES to totally occluded mid RCA.   ECG: NSR, inferior MI   Vitals:   01/10/16 0336 01/10/16 0400 01/10/16 0800 01/10/16 0815  BP: (!) 121/58 (!) 87/54 (!) 106/59 (!) 106/59  Pulse: 66 67 74 93  Resp: (!) 21 20 (!) 23 (!) 21  Temp:  97.3 F (36.3 C)  98.1 F (36.7 C)  TempSrc:  Oral  Oral  SpO2: 95% 94% 95% 98%  Weight: 115 lb 11.2 oz (52.5 kg)     Height:        Intake/Output Summary (Last 24 hours) at 01/10/16 1038 Last data filed at 01/10/16 0654  Gross per 24 hour  Intake              885 ml  Output             2350 ml  Net            -1465 ml    LABS: Basic Metabolic Panel:  Recent Labs  16/01/09 0523 01/10/16 0238  NA 139 140  K 3.9 3.8  CL 108 112*  CO2 26 23  GLUCOSE 108* 113*  BUN 11 9  CREATININE 0.71 0.83  CALCIUM 8.8* 8.8*   Liver Function Tests: No results for input(s): AST, ALT, ALKPHOS, BILITOT, PROT, ALBUMIN in the last 72 hours. No results for input(s): LIPASE, AMYLASE in the last 72 hours. CBC:  Recent Labs  01/09/16 1343 01/10/16 0238  WBC 6.3 8.6  HGB 10.0* 10.8*  HCT 31.3* 34.3*  MCV 95.7 95.8  PLT 147* 164   Cardiac Enzymes:  Recent Labs  01/08/16 2026 01/09/16 0523  TROPONINI 11.76* 12.75*   BNP: Invalid input(s): POCBNP D-Dimer: No results for input(s): DDIMER in the last 72 hours. Hemoglobin A1C: No results for input(s): HGBA1C in the last 72 hours. Fasting Lipid Panel:  Recent Labs  01/09/16 0523  CHOL 148  HDL 75  LDLCALC 69  TRIG 21  CHOLHDL 2.0   Thyroid Function Tests:  Recent Labs  01/09/16 0523  TSH 1.467   Anemia Panel: No results for input(s): VITAMINB12, FOLATE, FERRITIN, TIBC, IRON, RETICCTPCT in the last 72 hours.  RADIOLOGY: Dg Chest 2 View  Result Date: 01/08/2016 CLINICAL DATA:  Shortness of breath and mid chest  pain for 24 hours EXAM: CHEST  2 VIEW COMPARISON:  September 10, 2015 FINDINGS: The mediastinal contour is normal. The heart size is enlarged. Patchy opacity is identified in the right mid lung field. There is no pulmonary edema or pleural effusion. The visualized skeletal structures are stable. IMPRESSION: Patchy consolidation identified in the right mid lung field, pneumonia is not excluded. Electronically Signed   By: Sherian Rein M.D.   On: 01/08/2016 21:48    PHYSICAL EXAM General: NAD Neck: No JVD, no thyromegaly or thyroid nodule.  Lungs: Clear to auscultation bilaterally with normal respiratory effort. CV: Nondisplaced PMI.  Heart regular S1/S2, no S3/S4, 3/6 HSM at apex.  No peripheral edema.  No carotid bruit.  Normal pedal pulses.  Abdomen: Soft, nontender, no hepatosplenomegaly, no distention.  Neurologic: Alert and oriented x 3.  Psych: Normal affect. Extremities: No clubbing or cyanosis.   TELEMETRY: Reviewed telemetry pt in NSR  ASSESSMENT AND PLAN: 82 yo with no prior cardiac history presented late with  inferior MI.   1. CAD: Inferior MI, late presentation.  Cath on 10/27 with DES to totally occluded mid RCA.  No further chest pain.  EF 50-55% on LV-gram.  - Continue ASA 81, Plavix, statin.  - Continue low dose metoprolol.  - Add low dose lisinopril 2.5 mg daily.  2. Murmur: Loud murmur suggestive of mitral regurgitation.  ?Infarct-related MR, but not reported from LV-gram at time of cath.  Will need echo today to evaluate.  3. Walk with cardiac rehab, can go to telemetry.  Possibly home tomorrow.   Marca AnconaDalton Akari [employer]rysler 01/10/2016 10:42 AM

## 2016-01-10 NOTE — Progress Notes (Signed)
Md notified per pts request for home medication  Prevacid. Had given maalox earlier with no relief. New order received. Will continue to monitor. Alexis Yates

## 2016-01-10 NOTE — Progress Notes (Addendum)
CARDIAC REHAB PHASE I   PRE:  Rate/Rhythm: 78SR  BP:   Sitting: 87/65        Standing: 88/57   Supine:88/58     SaO2: 97% RA  MODE:  Ambulation: 84 ft   POST:  Rate/Rhythm: 83 SR  BP:   Sitting: 90/68     SaO2: 96% RA  1051-1211  Pt BP runs low. Did orthostatics and pt was asymptomatic. Pt ambulated 29ft with one person A, gait belt and walker. Pt did have increase SOB with walking and complained of being fatigued! Returned pt to bed. Education with pt and family member (daughter) on what a heart attack is, stent placement, stent card, importance of antiplatelete therapy and risk factors. Pt and daughter were very involved with education and had a lot of questions and concerns. They are very apprehensive about post cardiac care and going home. Assured the pt that they will get the tools they need to feel more confident. Extensive details/information on HH diet, eating out and exercise, NTG use and when to call 911. Expressed to pt the importance of rest and not over-doing activities. After thorough teach back and HO pt and daughter voiced understanding. Pt is already exercising at a rehab facility in [place]. Will send a referral to cardiac rehab in [place].    Gerardine Peltz D Jonmichael Beadnell,MS,ACSM-RCEP 01/10/2016 12:13 PM

## 2016-01-11 ENCOUNTER — Inpatient Hospital Stay (HOSPITAL_COMMUNITY): Payer: Medicare Other

## 2016-01-11 DIAGNOSIS — I2510 Atherosclerotic heart disease of native coronary artery without angina pectoris: Secondary | ICD-10-CM

## 2016-01-11 HISTORY — PX: TRANSTHORACIC ECHOCARDIOGRAM: SHX275

## 2016-01-11 LAB — ECHOCARDIOGRAM COMPLETE
HEIGHTINCHES: 60 in
Weight: 1813.06 oz

## 2016-01-11 LAB — CBC
HEMATOCRIT: 33.7 % — AB (ref 36.0–46.0)
Hemoglobin: 10.6 g/dL — ABNORMAL LOW (ref 12.0–15.0)
MCH: 30.5 pg (ref 26.0–34.0)
MCHC: 31.5 g/dL (ref 30.0–36.0)
MCV: 96.8 fL (ref 78.0–100.0)
PLATELETS: 159 10*3/uL (ref 150–400)
RBC: 3.48 MIL/uL — ABNORMAL LOW (ref 3.87–5.11)
RDW: 14.3 % (ref 11.5–15.5)
WBC: 9.1 10*3/uL (ref 4.0–10.5)

## 2016-01-11 LAB — BASIC METABOLIC PANEL
ANION GAP: 6 (ref 5–15)
BUN: 11 mg/dL (ref 6–20)
CHLORIDE: 108 mmol/L (ref 101–111)
CO2: 25 mmol/L (ref 22–32)
Calcium: 8.9 mg/dL (ref 8.9–10.3)
Creatinine, Ser: 0.79 mg/dL (ref 0.44–1.00)
Glucose, Bld: 99 mg/dL (ref 65–99)
POTASSIUM: 4.2 mmol/L (ref 3.5–5.1)
Sodium: 139 mmol/L (ref 135–145)

## 2016-01-11 MED ORDER — PANTOPRAZOLE SODIUM 40 MG PO TBEC
40.0000 mg | DELAYED_RELEASE_TABLET | Freq: Two times a day (BID) | ORAL | Status: DC
  Administered 2016-01-11 – 2016-01-13 (×5): 40 mg via ORAL
  Filled 2016-01-11 (×4): qty 1

## 2016-01-11 NOTE — Progress Notes (Signed)
Patient ID: Alexis FateMildred A Vrooman, female   DOB: 1927/04/09, 82 y.o.   MRN: [id number]   SUBJECTIVE: No dyspnea, no chest pain like her MI but does get heartburn after meals.  Walked with PT, weak and rehab stay recommended.  BP appears better today.   LHC yesterday: DES to totally occluded mid RCA.   ECG: NSR, inferior MI  Scheduled Meds: . aspirin EC  81 mg Oral Daily  . atorvastatin  80 mg Oral q1800  . bimatoprost  1 drop Left Eye QHS  . ciprofloxacin  500 mg Oral BID  . citalopram  20 mg Oral QPM  . clopidogrel  75 mg Oral Daily  . dorzolamide  1 drop Both Eyes BID  . lisinopril  2.5 mg Oral Daily  . metoprolol tartrate  12.5 mg Oral BID  . pantoprazole  40 mg Oral BID  . prednisoLONE acetate  1 drop Right Eye BID  . sodium chloride flush  3 mL Intravenous Q12H  . sodium chloride flush  3 mL Intravenous Q12H   Continuous Infusions:  PRN Meds:.sodium chloride, acetaminophen, ALPRAZolam, alum & mag hydroxide-simeth, magnesium hydroxide, morphine injection, nitroGLYCERIN, ondansetron (ZOFRAN) IV, oxyCODONE, sodium chloride flush, sodium chloride flush    Vitals:   01/10/16 2155 01/10/16 2334 01/11/16 0323 01/11/16 0734  BP: 102/68 104/66 126/73 131/78  Pulse: 79 77  74  Resp:  (!) 24  18  Temp:  98.3 F (36.8 C) 98.4 F (36.9 C) 97.6 F (36.4 C)  TempSrc:  Oral Oral Oral  SpO2:  92% 94% 95%  Weight:      Height:        Intake/Output Summary (Last 24 hours) at 01/11/16 0955 Last data filed at 01/11/16 0323  Gross per 24 hour  Intake              720 ml  Output              350 ml  Net              370 ml    LABS: Basic Metabolic Panel:  Recent Labs  62/95/2810/28/17 0238 01/11/16 0207  NA 140 139  K 3.8 4.2  CL 112* 108  CO2 23 25  GLUCOSE 113* 99  BUN 9 11  CREATININE 0.83 0.79  CALCIUM 8.8* 8.9   Liver Function Tests: No results for input(s): AST, ALT, ALKPHOS, BILITOT, PROT, ALBUMIN in the last 72 hours. No results for input(s): LIPASE, AMYLASE in the last 72  hours. CBC:  Recent Labs  01/10/16 0238 01/11/16 0207  WBC 8.6 9.1  HGB 10.8* 10.6*  HCT 34.3* 33.7*  MCV 95.8 96.8  PLT 164 159   Cardiac Enzymes:  Recent Labs  01/08/16 2026 01/09/16 0523  TROPONINI 11.76* 12.75*   BNP: Invalid input(s): POCBNP D-Dimer: No results for input(s): DDIMER in the last 72 hours. Hemoglobin A1C: No results for input(s): HGBA1C in the last 72 hours. Fasting Lipid Panel:  Recent Labs  01/09/16 0523  CHOL 148  HDL 75  LDLCALC 69  TRIG 21  CHOLHDL 2.0   Thyroid Function Tests:  Recent Labs  01/09/16 0523  TSH 1.467   Anemia Panel: No results for input(s): VITAMINB12, FOLATE, FERRITIN, TIBC, IRON, RETICCTPCT in the last 72 hours.  RADIOLOGY: Dg Chest 2 View  Result Date: 01/08/2016 CLINICAL DATA:  Shortness of breath and mid chest pain for 24 hours EXAM: CHEST  2 VIEW COMPARISON:  September 10, 2015 FINDINGS: The mediastinal contour  is normal. The heart size is enlarged. Patchy opacity is identified in the right mid lung field. There is no pulmonary edema or pleural effusion. The visualized skeletal structures are stable. IMPRESSION: Patchy consolidation identified in the right mid lung field, pneumonia is not excluded. Electronically Signed   By: Sherian Rein M.D.   On: 01/08/2016 21:48    PHYSICAL EXAM General: NAD Neck: No JVD, no thyromegaly or thyroid nodule.  Lungs: Clear to auscultation bilaterally with normal respiratory effort. CV: Nondisplaced PMI.  Heart regular S1/S2, no S3/S4, 3/6 HSM at apex.  No peripheral edema.   Abdomen: Soft, nontender, no hepatosplenomegaly, no distention.  Neurologic: Alert and oriented x 3.  Psych: Normal affect. Extremities: No clubbing or cyanosis.   TELEMETRY: Reviewed telemetry pt in NSR  ASSESSMENT AND PLAN: 82 yo with no prior cardiac history presented late with inferior MI.   1. CAD: Inferior MI, late presentation.  Cath on 10/27 with DES to totally occluded mid RCA.  No further  chest pain like MI, does get her typical reflux symptoms after meals (has been off PPI).  EF 50-55% on LV-gram.  - Continue ASA 81, Plavix, statin.  - Continue low dose metoprolol and lisinopril, no titration.  2. Murmur: Loud murmur suggestive of mitral regurgitation.  ?Infarct-related MR, but not reported from LV-gram at time of cath.  Still waiting for echo to evaluate.  3. GERD: Protonix 40 mg bid.  4. Working with PT, needs rehab stay.  Social work consulted.    Marca Ancona 01/11/2016 9:55 AM

## 2016-01-11 NOTE — Evaluation (Signed)
Physical Therapy Evaluation Patient Details Name: Alexis FateMildred A Kanno MRN: [id number] DOB: 1927-10-11 Today's Date: 01/11/2016   History of Present Illness  Pt adm with chest pain and found to have NSTEMI. PMH - HTN, GERD, glaucoma and severe bladder prolapse   Clinical Impression  Pt admitted with above diagnosis and presents to PT with functional limitations due to deficits listed below (See PT problem list). Pt needs skilled PT to maximize independence and safety to allow discharge to ST-SNF prior to return home. Currently pt with decr functional activity tolerance and not able to manage her daily activities at home. Lives alone and will need to have better activity tolerance to return. Pt and daughter agreeable to ST-SNF.     Follow Up Recommendations SNF    Equipment Recommendations  None recommended by PT    Recommendations for Other Services       Precautions / Restrictions Precautions Precautions: Fall Restrictions Weight Bearing Restrictions: No      Mobility  Bed Mobility Overal bed mobility: Needs Assistance Bed Mobility: Supine to Sit     Supine to sit: Min assist     General bed mobility comments: Assist to elevate trunk into sitting  Transfers Overall transfer level: Needs assistance Equipment used: 4-wheeled walker Transfers: Sit to/from UGI CorporationStand;Stand Pivot Transfers Sit to Stand: Min assist Stand pivot transfers: Min assist       General transfer comment: Assist for balance and safety  Ambulation/Gait Ambulation/Gait assistance: Min assist Ambulation Distance (Feet): 100 Feet Assistive device: 4-wheeled walker Gait Pattern/deviations: Step-through pattern;Decreased step length - right;Decreased step length - left;Trunk flexed;Shuffle   Gait velocity interpretation: <1.8 ft/sec, indicative of risk for recurrent falls General Gait Details: Pt fatigues quickly and required 2 standing rest breaks. RHR 79. HR with amb 93. SpO2 95% on RA with amb. Assist  for balance. Pt with severe genu valgus on rt.  Stairs            Wheelchair Mobility    Modified Rankin (Stroke Patients Only)       Balance Overall balance assessment: Needs assistance Sitting-balance support: No upper extremity supported;Feet supported Sitting balance-Leahy Scale: Good     Standing balance support: Single extremity supported Standing balance-Leahy Scale: Poor Standing balance comment: UE support and min guard for static standing                             Pertinent Vitals/Pain Pain Assessment: No/denies pain    Home Living Family/patient expects to be discharged to:: Private residence Living Arrangements: Alone   Type of Home: House Home Access: Ramped entrance     Home Layout: One level Home Equipment: Environmental consultantWalker - 4 wheels;Cane - single point;Bedside commode;Shower seat;Hand held shower head      Prior Function Level of Independence: Independent with assistive device(s)         Comments: Uses cane. Uses rollator for groceries.     Hand Dominance        Extremity/Trunk Assessment   Upper Extremity Assessment: Generalized weakness           Lower Extremity Assessment: Generalized weakness         Communication   Communication: No difficulties  Cognition Arousal/Alertness: Awake/alert Behavior During Therapy: WFL for tasks assessed/performed Overall Cognitive Status: Within Functional Limits for tasks assessed                      General Comments  Exercises     Assessment/Plan    PT Assessment Patient needs continued PT services  PT Problem List Decreased strength;Decreased activity tolerance;Decreased balance;Decreased mobility          PT Treatment Interventions DME instruction;Gait training;Functional mobility training;Therapeutic activities;Therapeutic exercise;Balance training;Patient/family education    PT Goals (Current goals can be found in the Care Plan section)  Acute Rehab  PT Goals Patient Stated Goal: Pt wants to be able to return to the gym PT Goal Formulation: With patient/family Time For Goal Achievement: 01/25/16 Potential to Achieve Goals: Good    Frequency Min 3X/week   Barriers to discharge Decreased caregiver support Lives alone    Co-evaluation               End of Session Equipment Utilized During Treatment: Gait belt Activity Tolerance: Patient limited by fatigue Patient left: in chair;with call bell/phone within reach;with family/visitor present Nurse Communication: Mobility status         Time: 1829-9371 PT Time Calculation (min) (ACUTE ONLY): 18 min   Charges:   PT Evaluation $PT Eval Moderate Complexity: 1 Procedure     PT G Codes:        Gibson Lad 2016/01/27, 8:42 AM [facility] PT [phone]

## 2016-01-11 NOTE — Progress Notes (Signed)
Patient came to the floor at 1340  alert and oriented denies pain, no shortness of breath, v/s stable. Patient daughter at the bedside. Will continue to monitor patient

## 2016-01-12 LAB — BASIC METABOLIC PANEL
ANION GAP: 5 (ref 5–15)
BUN: 10 mg/dL (ref 6–20)
CO2: 27 mmol/L (ref 22–32)
Calcium: 9.1 mg/dL (ref 8.9–10.3)
Chloride: 108 mmol/L (ref 101–111)
Creatinine, Ser: 0.9 mg/dL (ref 0.44–1.00)
GFR, EST NON AFRICAN AMERICAN: 56 mL/min — AB (ref >60)
Glucose, Bld: 117 mg/dL — ABNORMAL HIGH (ref 65–99)
POTASSIUM: 3.9 mmol/L (ref 3.5–5.1)
SODIUM: 140 mmol/L (ref 135–145)

## 2016-01-12 LAB — CBC
HEMATOCRIT: 32.8 % — AB (ref 36.0–46.0)
HEMOGLOBIN: 10.4 g/dL — AB (ref 12.0–15.0)
MCH: 30 pg (ref 26.0–34.0)
MCHC: 31.7 g/dL (ref 30.0–36.0)
MCV: 94.5 fL (ref 78.0–100.0)
Platelets: 192 10*3/uL (ref 150–400)
RBC: 3.47 MIL/uL — ABNORMAL LOW (ref 3.87–5.11)
RDW: 14 % (ref 11.5–15.5)
WBC: 9.3 10*3/uL (ref 4.0–10.5)

## 2016-01-12 MED ORDER — CITALOPRAM HYDROBROMIDE 10 MG/5ML PO SOLN: 20.0000 mg | Freq: Every day | ORAL | Status: DC

## 2016-01-12 MED ORDER — MORPHINE SULFATE ER 30 MG PO TBCR
30.0000 mg | EXTENDED_RELEASE_TABLET | Freq: Two times a day (BID) | ORAL | Status: DC
  Administered 2016-01-12 – 2016-01-13 (×3): 30 mg via ORAL
  Filled 2016-01-12 (×3): qty 1

## 2016-01-12 MED ORDER — LISINOPRIL 2.5 MG PO TABS
1.2500 mg | ORAL_TABLET | Freq: Every day | ORAL | Status: DC
  Administered 2016-01-13: 1.25 mg via ORAL
  Filled 2016-01-12: qty 1

## 2016-01-12 MED ORDER — ATORVASTATIN CALCIUM 40 MG PO TABS
40.0000 mg | ORAL_TABLET | Freq: Every day | ORAL | Status: DC
  Administered 2016-01-12 – 2016-01-13 (×2): 40 mg via ORAL
  Filled 2016-01-12 (×2): qty 1

## 2016-01-12 MED FILL — Verapamil HCl IV Soln 2.5 MG/ML: INTRAVENOUS | Qty: 2 | Status: AC

## 2016-01-12 MED FILL — Heparin Sodium (Porcine) 2 Unit/ML in Sodium Chloride 0.9%: INTRAMUSCULAR | Qty: 500 | Status: AC

## 2016-01-12 NOTE — Progress Notes (Signed)
CARDIAC REHAB PHASE I   PRE:  Rate/Rhythm: 70 SR  BP:  Supine: 119/77  Sitting:   Standing:    SaO2: 94%RA  MODE:  Ambulation: 120 ft   POST:  Rate/Rhythm: 72  BP:  Supine:   Sitting: 139/77  Standing:    SaO2: 95%RA 1155-1220 Pt walked 120 ft on RA with gait belt use, rolling walker and asst x 1. Tolerated well. Leg and knee discomfort from orthopedic issues. To recliner with call bell. No CP.   Luetta Nutting, RN BSN  01/12/2016 12:17 PM

## 2016-01-12 NOTE — Clinical Social Work Note (Signed)
Clinical Social Work Assessment  Patient Details  Name: Alexis Yates MRN: [id number] Date of Birth: 07/04/1927  Date of referral:  01/12/16               Reason for consult:  Facility Placement, Discharge Planning                Permission sought to share information with:  Family Supports Permission granted to share information::  Yes, Verbal Permission Granted  Name::     Alexis Yates  Relationship::  daughter  Contact Information:  [phone]  Housing/Transportation Living arrangements for the past 2 months:  [place] of Information:  Patient, Adult Children Patient Interpreter Needed:  None Criminal Activity/Legal Involvement Pertinent to Current Situation/Hospitalization:  No - Comment as needed Significant Relationships:  Adult Children Lives with:  Self Do you feel safe going back to the place where you live?  No (Pt is in need of a higher level of care. ) Need for family participation in patient care:   No  Care giving concerns:  No care giving concerns identified.   Social Worker assessment / plan:  CSW met with pt and daughter to address consult for New SNF. CSW introduced herself and explained role of social work. CSW also explained the process of discharging to SNF as recommended. CSW initiated SNF search and will follow up with bed offers.   Employment status:  Retired Nurse, adult PT Recommendations:  [place] / Referral to community resources:  [place]  Patient/Family's Response to care:  Pt and daughter were appreciative of CSW support.   Patient/Family's Understanding of and Emotional Response to Diagnosis, Current Treatment, and Prognosis:  Pt understands that she would benefit from STR at SNF prior to returning home.   Emotional Assessment Appearance:  Appears stated age Attitude/Demeanor/Rapport:   (Appropriate) Affect (typically observed):  Accepting,  Adaptable, Pleasant Orientation:  Oriented to Self, Oriented to Place, Oriented to  Time, Oriented to Situation Alcohol / Substance use:  Never Used Psych involvement (Current and /or in the community):  No (Comment)  Discharge Needs  Concerns to be addressed:  Adjustment to Illness Readmission within the last 30 days:  No Current discharge risk:  Chronically ill Barriers to Discharge:  Continued Medical Work up   [employer], LCSW 01/12/2016, 3:49 PM

## 2016-01-12 NOTE — Progress Notes (Signed)
Subjective: Breathing is OK  No CP  No dizziness Objective: Vitals:   01/11/16 1341 01/11/16 1952 01/12/16 0504 01/12/16 0508  BP: (!) 97/56 135/64  129/66  Pulse: 82 72  64  Resp: 20 20    Temp: 98.6 F (37 C) 98.5 F (36.9 C)  98.5 F (36.9 C)  TempSrc: Oral Oral  Oral  SpO2: 97% 94%  100%  Weight: 113 lb 5.1 oz (51.4 kg)  111 lb (50.3 kg)   Height: 5' (1.524 m)      Weight change:   Intake/Output Summary (Last 24 hours) at 01/12/16 0945 Last data filed at 01/12/16 0816  Gross per 24 hour  Intake              720 ml  Output             1800 ml  Net            -1080 ml    General: Alert, awake, oriented x3, in no acute distress Neck:  JVP is normal Heart: Regular rate and rhythm, II/VI systolic murmur LSB  No rubs, gallops.  Lungs: Clear to auscultation.  No rales or wheezes. Exemities:  No edema.   Neuro: Grossly intact, nonfocal.  TEle  SR   Lab Results: Results for orders placed or performed during the hospital encounter of 01/08/16 (from the past 24 hour(s))  Basic metabolic panel     Status: Abnormal   Collection Time: 01/12/16  3:40 AM  Result Value Ref Range   Sodium 140 135 - 145 mmol/L   Potassium 3.9 3.5 - 5.1 mmol/L   Chloride 108 101 - 111 mmol/L   CO2 27 22 - 32 mmol/L   Glucose, Bld 117 (H) 65 - 99 mg/dL   BUN 10 6 - 20 mg/dL   Creatinine, Ser 7.41 0.44 - 1.00 mg/dL   Calcium 9.1 8.9 - 42.3 mg/dL   GFR calc non Af Amer 56 (L) >60 mL/min   GFR calc Af Amer >60 >60 mL/min   Anion gap 5 5 - 15  CBC     Status: Abnormal   Collection Time: 01/12/16  3:40 AM  Result Value Ref Range   WBC 9.3 4.0 - 10.5 K/uL   RBC 3.47 (L) 3.87 - 5.11 MIL/uL   Hemoglobin 10.4 (L) 12.0 - 15.0 g/dL   HCT 95.3 (L) 20.2 - 33.4 %   MCV 94.5 78.0 - 100.0 fL   MCH 30.0 26.0 - 34.0 pg   MCHC 31.7 30.0 - 36.0 g/dL   RDW 35.6 86.1 - 68.3 %   Platelets 192 150 - 400 K/uL    Studies/Results: No results found.  Medications: Reviewed    @PROBHOSP @  1 CAD  IWMI   Late presentation  Pt underwent PTCA/DES to RCA on 10/27   LVEF 50 to 55%  Echo  LEF 60 to 65%  Dynamic outflow obstruction of LVOT with peak gradient of 108 mm Hg    SAM of anterior mitral leaflet    Mild MR  Mild pulmonary HTN  I would cut back on lisinopril to 1.25  Continue b blocker  Follow BP and HR and symtoms   2  Murmur  Echo Mild MR  Dynamic LVOT obstruction  REviewed with pt  Avoid dehydration  Avoid NTG  WIll ask nursing to assess for rehab placement  Pt lives alone  Wants to return to full indeprendence    LOS: 4 days   Dietrich Pates  01/12/2016, 9:45 AM

## 2016-01-12 NOTE — Care Management Important Message (Signed)
Important Message  Patient Details  Name: Alexis Yates MRN: [id number] Date of Birth: 01-Sep-1927   Medicare Important Message Given:  Yes    [name] 01/12/2016, 5:08 PM

## 2016-01-12 NOTE — [place] FL2 (Addendum)
[place] MEDICAID FL2 LEVEL OF CARE SCREENING TOOL     IDENTIFICATION  Patient Name: Alexis Yates Birthdate: [birth year] Sex: female Admission Date (Current Location): 01/08/2016  [facility] [place]   Facility and Address:  [facility] [place], [address], [place], [place] [address]      Provider Number: [id number]  Attending Physician Name and Address:  Dolores Pattyaniel R Bensimhon, MD  Relative Name and Phone Number:       Current Level of Care: Hospital Recommended Level of Care: Skilled Nursing Facility Prior Approval Number:    Date Approved/Denied:   PASRR Number: [id number]  Discharge Plan: SNF    Current Diagnoses: Patient Active Problem List   Diagnosis Date Noted  . ST elevation myocardial infarction (STEMI) of inferior wall, initial episode of care (HCC) 01/09/2016  . Urinary tract infection without hematuria   . NSTEMI (non-ST elevated myocardial infarction) (HCC) 01/08/2016  . UTI (urinary tract infection) 05/27/2014  . PID (acute pelvic inflammatory disease) 05/27/2014  . Vaginal inflammation from pessary (HCC) 05/27/2014  . Leukocytosis 05/27/2014  . Nausea vomiting and diarrhea 05/27/2014  . Hypertension   . Hiatal hernia   . Lung nodules   . Glaucoma   . GERD (gastroesophageal reflux disease)     Orientation RESPIRATION BLADDER Height & Weight     Self, Time, Situation, Place  Normal Continent Weight: 111 lb (50.3 kg) Height:  5' (152.4 cm)  BEHAVIORAL SYMPTOMS/MOOD NEUROLOGICAL BOWEL NUTRITION STATUS      Continent Diet (Heart Healthy, Thin Liquids)  AMBULATORY STATUS COMMUNICATION OF NEEDS Skin   Limited Assist Verbally Normal                       Personal Care Assistance Level of Assistance  Bathing, Dressing, Feeding Bathing Assistance: Limited assistance Feeding assistance: Independent Dressing Assistance: Limited assistance     Functional Limitations Info  Sight, Hearing, Speech Sight  Info: Adequate Hearing Info: Adequate Speech Info: Adequate    SPECIAL CARE FACTORS FREQUENCY  PT (By licensed PT), OT (By licensed OT)     PT Frequency: 5 OT Frequency: 5            Contractures Contractures Info: Not present    Additional Factors Info  Code Status, Allergies, Psychotropic Code Status Info: Code Status Allergies Info: Penicillins, Phenergan Promethazine Hcl Psychotropic Info: Celexa         Current Medications (01/12/2016):  This is the current hospital active medication list Current Facility-Administered Medications  Medication Dose Route Frequency Provider Last Rate Last Dose  . 0.9 %  sodium chloride infusion  250 mL Intravenous PRN Marykay Lexavid W Harding, MD      . acetaminophen (TYLENOL) tablet 650 mg  650 mg Oral Q4H PRN Marykay Lexavid W Harding, MD   650 mg at 01/11/16 2108  . ALPRAZolam Prudy Feeler(XANAX) tablet 0.25 mg  0.25 mg Oral BID PRN Earnie LarssonFrank R Wilson, RPH   0.25 mg at 01/11/16 2245  . alum & mag hydroxide-simeth (MAALOX/MYLANTA) 200-200-20 MG/5ML suspension 15 mL  15 mL Oral Q4H PRN Dolores Pattyaniel R Bensimhon, MD   15 mL at 01/11/16 1327  . aspirin EC tablet 81 mg  81 mg Oral Daily Earnie LarssonFrank R Wilson, RPH   81 mg at 01/12/16 [id number]  . atorvastatin (LIPITOR) tablet 40 mg  40 mg Oral q1800 Pricilla RifflePaula V Ross, MD      . bimatoprost (LUMIGAN) 0.01 % ophthalmic solution 1 drop  1 drop  Left Eye QHS Dolores Patty, MD   1 drop at 01/11/16 2112  . ciprofloxacin (CIPRO) tablet 500 mg  500 mg Oral BID Dolores Patty, MD   500 mg at 01/12/16 0805  . citalopram (CELEXA) tablet 20 mg  20 mg Oral QPM Earnie Larsson, RPH   20 mg at 01/11/16 1744  . clopidogrel (PLAVIX) tablet 75 mg  75 mg Oral Daily Dolores Patty, MD   75 mg at 01/12/16 0947  . dorzolamide (TRUSOPT) 2 % ophthalmic solution 1 drop  1 drop Both Eyes BID Earnie Larsson, RPH   1 drop at 01/12/16 6553  . [START ON 01/13/2016] lisinopril (PRINIVIL,ZESTRIL) tablet 1.25 mg  1.25 mg Oral Daily Pricilla Riffle, MD      . magnesium  hydroxide (MILK OF MAGNESIA) suspension 30 mL  30 mL Oral Daily PRN Laurey Morale, MD   30 mL at 01/10/16 1554  . metoprolol tartrate (LOPRESSOR) tablet 12.5 mg  12.5 mg Oral BID Earnie Larsson, RPH   12.5 mg at 01/12/16 7482  . morphine (MS CONTIN) 12 hr tablet 30 mg  30 mg Oral Q12H Pricilla Riffle, MD   30 mg at 01/12/16 1201  . morphine 2 MG/ML injection 1 mg  1 mg Intravenous Q1H PRN Marykay Lex, MD   1 mg at 01/10/16 1240  . ondansetron (ZOFRAN) injection 4 mg  4 mg Intravenous Q6H PRN Marykay Lex, MD      . oxyCODONE (Oxy IR/ROXICODONE) immediate release tablet 5 mg  5 mg Oral TID PRN Earnie Larsson, RPH   5 mg at 01/12/16 0947  . pantoprazole (PROTONIX) EC tablet 40 mg  40 mg Oral BID Laurey Morale, MD   40 mg at 01/12/16 0947  . prednisoLONE acetate (PRED FORTE) 1 % ophthalmic suspension 1 drop  1 drop Right Eye BID Laurey Morale, MD   1 drop at 01/12/16 [phone]  . sodium chloride flush (NS) 0.9 % injection 3 mL  3 mL Intravenous Q12H Marykay Lex, MD   3 mL at 01/12/16 1000  . sodium chloride flush (NS) 0.9 % injection 3 mL  3 mL Intravenous PRN Marykay Lex, MD      . sodium chloride flush (NS) 0.9 % injection 3 mL  3 mL Intravenous PRN Earnie Larsson, RPH      . sodium chloride flush (NS) 0.9 % injection 3 mL  3 mL Intravenous Q12H Earnie Larsson, RPH   3 mL at 01/12/16 1000     Discharge Medications: Please see discharge summary for a list of discharge medications.  Relevant Imaging Results:  Relevant Lab Results:   Additional Information SSN:  [id number]  Dede Query, LCSW

## 2016-01-12 NOTE — Clinical Social Work Placement (Signed)
   CLINICAL SOCIAL WORK PLACEMENT  NOTE  Date:  01/12/2016  Patient Details  Name: Alexis Yates MRN: [id number] Date of Birth: 20-Mar-1927  Clinical Social Work is seeking post-discharge placement for this patient at the Skilled  Nursing Facility level of care (*CSW will initial, date and re-position this form in  chart as items are completed):  Yes   Patient/family provided with [facility] Clinical Social Work Department's list of facilities offering this level of care within the geographic area requested by the patient (or if unable, by the patient's family).  Yes   Patient/family informed of their freedom to choose among providers that offer the needed level of care, that participate in Medicare, Medicaid or managed care program needed by the patient, have an available bed and are willing to accept the patient.  Yes   Patient/family informed of [facility]'s ownership interest in [facility] and [facility], as well as of the fact that they are under no obligation to receive care at these facilities.  PASRR submitted to EDS on       PASRR number received on       Existing PASRR number confirmed on 01/12/16     FL2 transmitted to all facilities in geographic area requested by pt/family on 01/12/16     FL2 transmitted to all facilities within larger geographic area on       Patient informed that his/her managed care company has contracts with or will negotiate with certain facilities, including the following:            Patient/family informed of bed offers received.  Patient chooses bed at       Physician recommends and patient chooses bed at      Patient to be transferred to   on  .  Patient to be transferred to facility by       Patient family notified on   of transfer.  Name of family member notified:        PHYSICIAN       Additional Comment:    _______________________________________________ Dede Query, LCSW 01/12/2016, 3:47 PM

## 2016-01-13 ENCOUNTER — Encounter (HOSPITAL_COMMUNITY): Payer: Self-pay | Admitting: Cardiology

## 2016-01-13 ENCOUNTER — Telehealth: Payer: Self-pay | Admitting: Cardiology

## 2016-01-13 ENCOUNTER — Encounter
Admission: RE | Admit: 2016-01-13 | Discharge: 2016-01-13 | Disposition: A | Discharge status: Home / Self Care | Payer: Medicare Other | Source: Ambulatory Visit | Attending: Internal Medicine | Admitting: Internal Medicine

## 2016-01-13 MED ORDER — NITROGLYCERIN 0.4 MG SL SUBL: 0.4000 mg | SUBLINGUAL_TABLET | SUBLINGUAL | 3 refills | Status: DC | PRN

## 2016-01-13 MED ORDER — ASPIRIN 81 MG PO TBEC: 81.0000 mg | DELAYED_RELEASE_TABLET | Freq: Every day | ORAL | 3 refills | Status: DC

## 2016-01-13 MED ORDER — ATORVASTATIN CALCIUM 40 MG PO TABS: 40.0000 mg | ORAL_TABLET | Freq: Every day | ORAL | 6 refills | Status: DC

## 2016-01-13 MED ORDER — METOPROLOL TARTRATE 25 MG PO TABS: 12.5000 mg | ORAL_TABLET | Freq: Two times a day (BID) | ORAL | 6 refills | Status: DC

## 2016-01-13 MED ORDER — PANTOPRAZOLE SODIUM 40 MG PO TBEC: 40.0000 mg | DELAYED_RELEASE_TABLET | Freq: Two times a day (BID) | ORAL | 11 refills | Status: DC

## 2016-01-13 MED ORDER — CLOPIDOGREL BISULFATE 75 MG PO TABS: 75.0000 mg | ORAL_TABLET | Freq: Every day | ORAL | 11 refills | Status: AC

## 2016-01-13 NOTE — Telephone Encounter (Signed)
TCM Phone Call.... Appt on 01/23/16 at 10am w/ Azalee Course .Marland Kitchen Thanks

## 2016-01-13 NOTE — Progress Notes (Signed)
Physical Therapy Treatment Patient Details Name: Alexis Yates MRN: [id number] DOB: 04-21-1927 Today's Date: 01/13/2016    History of Present Illness Pt adm with chest pain and found to have NSTEMI. PMH - HTN, GERD, glaucoma and severe bladder prolapse     PT Comments    Pt tolerated increased distance of 150' ambulating today, HR 86 with walking. Progressing well with mobility.   Follow Up Recommendations  SNF     Equipment Recommendations  None recommended by PT    Recommendations for Other Services       Precautions / Restrictions Precautions Precautions: Fall Precaution Comments: pt fell a week ago, sun was in her eyes and tripped, skin tear L forearm Restrictions Weight Bearing Restrictions: No    Mobility  Bed Mobility Overal bed mobility: Needs Assistance Bed Mobility: Supine to Sit;Sit to Supine     Supine to sit: Modified independent (Device/Increase time);HOB elevated Sit to supine: Modified independent (Device/Increase time)   General bed mobility comments: used rail, HOB up 20*  Transfers Overall transfer level: Needs assistance Equipment used: Rolling walker (2 wheeled) Transfers: Sit to/from Stand Sit to Stand: Min guard         General transfer comment: min/guard for safety  Ambulation/Gait Ambulation/Gait assistance: Min guard Ambulation Distance (Feet): 150 Feet Assistive device: Rolling walker (2 wheeled) Gait Pattern/deviations: Decreased step length - right;Decreased step length - left;Step-through pattern   Gait velocity interpretation: Below normal speed for age/gender General Gait Details: HR 86 with ambulation, no dyspnea, no LOB, valgus R knee (pt reports this has progressively worsened), VCs to increase step length   Stairs            Wheelchair Mobility    Modified Rankin (Stroke Patients Only)       Balance     Sitting balance-Leahy Scale: Good     Standing balance support: Bilateral upper extremity  supported Standing balance-Leahy Scale: Poor Standing balance comment: relies on UE support                    Cognition Arousal/Alertness: Awake/alert Behavior During Therapy: WFL for tasks assessed/performed Overall Cognitive Status: Within Functional Limits for tasks assessed                      Exercises      General Comments        Pertinent Vitals/Pain Pain Assessment: No/denies pain    Home Living                      Prior Function            PT Goals (current goals can now be found in the care plan section) Acute Rehab PT Goals Patient Stated Goal: Pt wants to be able to return to the gym PT Goal Formulation: With patient/family Time For Goal Achievement: 01/25/16 Potential to Achieve Goals: Good Progress towards PT goals: Progressing toward goals    Frequency    Min 3X/week      PT Plan Current plan remains appropriate    Co-evaluation             End of Session Equipment Utilized During Treatment: Gait belt Activity Tolerance: Patient tolerated treatment well Patient left: in bed;with call bell/phone within reach     Time: 3748-2707 PT Time Calculation (min) (ACUTE ONLY): 17 min  Charges:  $Gait Training: 8-22 mins  G Codes:      Tamala SerUhlenberg, Mina Babula Kistler 01/13/2016, 11:43 AM [phone]

## 2016-01-13 NOTE — Clinical Social Work Placement (Signed)
   CLINICAL SOCIAL WORK PLACEMENT  NOTE 01/13/16 - DISCHARGED TO [facility], TRANSPORTED BY FAMILY  Date:  01/13/2016  Patient Details  Name: Alexis Yates MRN: [id number] Date of Birth: 1927-08-02  Clinical Social Work is seeking post-discharge placement for this patient at the Skilled  Nursing Facility level of care (*CSW will initial, date and re-position this form in  chart as items are completed):  Yes   Patient/family provided with [facility] Clinical Social Work Department's list of facilities offering this level of care within the geographic area requested by the patient (or if unable, by the patient's family).  Yes   Patient/family informed of their freedom to choose among providers that offer the needed level of care, that participate in Medicare, Medicaid or managed care program needed by the patient, have an available bed and are willing to accept the patient.  Yes   Patient/family informed of [facility]'s ownership interest in [facility] and [facility], as well as of the fact that they are under no obligation to receive care at these facilities.  PASRR submitted to EDS on       PASRR number received on       Existing PASRR number confirmed on 01/12/16     FL2 transmitted to all facilities in geographic area requested by pt/family on 01/12/16     FL2 transmitted to all facilities within larger geographic area on       Patient informed that his/her managed care company has contracts with or will negotiate with certain facilities, including the following:         01/13/16 - Patient/family informed of bed offers received.  Patient chooses bed at  [facility]      Physician recommends and patient chooses bed at      Patient to be transferred to  [facility] on  01/13/16.  Patient to be transferred to facility by  family.     Patient family notified on  01/13/16 of transfer.  Name of family member notified:   Daughter [facility]     PHYSICIAN    Additional Comment:    _______________________________________________ Cristobal Goldmann, LCSW 01/13/2016, 6:23 PM

## 2016-01-13 NOTE — Progress Notes (Signed)
CARDIAC REHAB PHASE I   PRE:  Rate/Rhythm: 78 SR  BP:  Supine:   Sitting: 103/70  Standing:    SaO2: 96%RA  MODE:  Ambulation: 88 ft   POST:  Rate/Rhythm: 85 SR  BP:  Supine: 104/55  Sitting:   Standing:    SaO2: 90%RA 7034-0352 Helped pt brush her teeth and then we walked 88 ft on RA with rolling walker and asst x1. Marland Kitchen Pt with slow pace. To bed after walk as she had been in chair for a while and back starting to bother her. PT to see later. Call bell in reach.   Luetta Nutting, RN BSN  01/13/2016 9:40 AM

## 2016-01-13 NOTE — Progress Notes (Signed)
Patient has order to discharge to SNF. Discharge instructions reviewed with patient and verbalizes understanding. IV's and telemetry removed. IV sites clean and dry. Patient's daughter is to take her to facility via car. Patient stable and currently awaiting transportation via daughter.

## 2016-01-13 NOTE — Progress Notes (Signed)
   Subjective: Deneis CP  No SOB  No dizziness   Objective: Vitals:   01/12/16 0508 01/12/16 1101 01/12/16 2031 01/13/16 0603  BP: 129/66 (!) 148/71 (!) 96/49 100/64  Pulse: 64 70 76 74  Resp:  18 18 18   Temp: 98.5 F (36.9 C) 98.4 F (36.9 C) 97.7 F (36.5 C) 97.8 F (36.6 C)  TempSrc: Oral Oral Oral Oral  SpO2: 100% 96% 95% 93%  Weight:    111 lb 12.8 oz (50.7 kg)  Height:       Weight change: -1 lb 8.3 oz (-0.688 kg)  Intake/Output Summary (Last 24 hours) at 01/13/16 1305 Last data filed at 01/13/16 0745  Gross per 24 hour  Intake              360 ml  Output              600 ml  Net             -240 ml    General: Alert, awake, oriented x3, in no acute distress Neck:  JVP is normal Heart: Regular rate and rhythm, Gr II/VI systolicmurmur LSB  No  rubs, gallops.  Lungs: Clear to auscultation.  No rales or wheezes. Exemities:  No edema.   Neuro: Grossly intact, nonfocal.  Tele  Sr  Lab Results: No results found for this or any previous visit (from the past 24 hour(s)).  Studies/Results: No results found.  Medications: REviewed   @PROBHOSP @  1  CAD  S/p IWMI  Late presentaton  S/p DES to RCA  LVEF normal on echo with dynamic outflow obstruction and SAM  I would continue meds but d/c lisinopril  May be making oastruction worse with afterload reduction.  2   Rehab  Spoke to daughter Aline Brochure  She will talk to SW about rehab facilities    D/ soon on [facility] .   LOS: 5 days   Dietrich Pates 01/13/2016, 1:05 PM

## 2016-01-13 NOTE — Progress Notes (Signed)
Report called to receiving facility. Facility aware that family will bring patient via car.

## 2016-01-13 NOTE — Discharge Summary (Signed)
Discharge Summary    Patient ID: Alexis Yates,  MRN: [id number], DOB/AGE: December 22, 1927 82 y.o.  Admit date: 01/08/2016 Discharge date: 01/13/2016  Primary Care Provider: GATES,ROBERT NEVILL Primary Cardiologist: Dr. Herbie Baltimore  Discharge Diagnoses    Principal Problem:   NSTEMI (non-ST elevated myocardial infarction) [facility]) Active Problems:   Hypertension   Urinary tract infection without hematuria   ST elevation myocardial infarction (STEMI) of inferior wall, initial episode of care [facility])   Allergies Allergies  Allergen Reactions  . Penicillins Anaphylaxis  . Phenergan [Promethazine Hcl]     Restless legs    Diagnostic Studies/Procedures    LHC: 10/27  Conclusion     Mid RCA lesion, 100 %stenosed.  A STENT PROMUS PREM MR 2.25X28 drug eluting stent was successfully placed.  Post intervention, there is a 0% residual stenosis.  The left ventricular ejection fraction is 50-55% by visual estimate. Apical Inferior Hypo to Akinesis.  Residual CAD  Ost LAD to Prox LAD lesion, 40 %stenosed. Mid LAD lesion, 30 %stenosed.  1st Mrg lesion, 70 %stenosed. Very small vessel. 2nd Mrg lesion, 30 %stenosed.  Ost 2nd Diag to 2nd Diag lesion, 90 %stenosed. Very small vessel.   I suspect the patient was having stuttering symptoms from her RCA disease and now is occluded. Very difficult PCI procedure. There was extensive clot in the artery and I had to do several balloon inflations to achieve TIMI 2-3 flow distally. Initial thoughts were to do PTCA only basilar sized vessel, however there was a hazy segment at the original occlusion site that I felt was best covered with a stent.  The lesion was successfully treated with a DES stent. He was loaded with Plavix (initial thoughts were that she may be a twilight candidate, however this appears to be not the case) -- we will need to change orders back to continue Plavix tomorrow morning and not load Brilinta.   Plan:  Turn to  CCU for ongoing care. Sheath removal per protocol. Would continue aspirin plus Plavix for 1 year. Cardiac risk factor modification - beta blocker, statin    TTE: 10/29  Study Conclusions  - Left ventricle: The cavity size was normal. There was moderate   focal basal hypertrophy. Systolic function was normal. The   estimated ejection fraction was in the range of 60% to 65%. There   was dynamic obstruction at restin the outflow tract, with a peak   gradient of 108 mm Hg. There was dynamic obstruction during   Valsalvain the outflow tract, with a peak gradient of 138 mm Hg.   Wall motion was normal; there were no regional wall motion   abnormalities. Features are consistent with a pseudonormal left   ventricular filling pattern, with concomitant abnormal relaxation   and increased filling pressure (grade 2 diastolic dysfunction).   Doppler parameters are consistent with high ventricular filling   pressure. - Aortic valve: Trileaflet; mildly thickened, mildly calcified   leaflets. There was trivial regurgitation. - Mitral valve: Moderately calcified annulus. Mild focal   calcification of the anterior leaflet (lateral segment(s)) and   posterior leaflet (lateral scallop(s)). There was severe systolic   anterior motion of the anterior leaflet. There was mild   regurgitation. - Left atrium: The atrium was mildly dilated. - Atrial septum: There was increased thickness of the septum,   consistent with lipomatous hypertrophy. - Pulmonary arteries: PA peak pressure: 35 mm Hg (S).  Impressions:  - The right ventricular systolic pressure was increased consistent  with mild pulmonary hypertension.  ____________   History of Present Illness     [name] is an 82 y/o woman with HTN, GERD, glaucoma and severe bladder prolapse managed with a pessary who is admitted for NSTEMI.  She denies a history of known CAD. She presented to [facility] ER earlier this evening with weakness and a burning  sensation in her chest. Says the pain actually began on Wednesday afternoon when she was in Dr. Nilsa Nuttinglin's office being evaluated for her R knee osteoarthritis. The pain subsided for a bit then cam back early in the evening and was quite severe with radiation into her left arm. She took Gaviscon without relief. Woke up this morning with mild pian which waxed and waned throughout the day. She finally came to the ER because she felt profoundly weak and was unable to do ADLs. Last chest pain was at 6pm  In ER, ECG with NSR 79 with severe baseline artifact. Possible inferior q-waves and TWI. Troponin 11.76. ER physician discussed case with Dr. Tenny Crawoss and patient transferred to [facility] for admission. Currently pain free at that time of admission. Her UA was positive for moderate leukocytes on admission.   Hospital Course     Consultants: None  Given her troponin elevation and no previous CAD, decision was made to proceed with LHC. Cath with Dr. Herbie BaltimoreHarding showed 100% RCA lesion that was treated with DES with 0% residual stenosis. LV function was noted at 50-55% by visual estimate. She was started on DAPT with ASA and plavix. She was also started on statin low dose metoprolol and lisinopril 2.5mg .   She walked with cardiac rehab and PT who recommended she go to SNF for short rehab stay. Did have some heartburn, but no pain like her chest pain present with MI. Follow up echo showed EF of 60-65% with dynamic outflow obstruction of LVOT with peak gradient of 108 mm Hg, SAM of anterior mitral leaflet, Mild MR and Mild pulmonary HTN. She was seen by case management and social work who found placement for her at outside facility. Her lisinopril was held following her echo, as this was thought to making her outflow obstruction worse with afterload reduction. She also completed a 5 day course of cipro this admission for UA noted on admission.   On 10/31 she was seen and assessed by Dr. Tenny Crawoss and determined stable for discharge. [facility]  [facility] [place] assisted living. I have arranged for follow up in the office next week with an APP.  _____________  Discharge Vitals Blood pressure 100/64, pulse 74, temperature 97.8 F (36.6 C), temperature source Oral, resp. rate 18, height 5' (1.524 m), weight 111 lb 12.8 oz (50.7 kg), SpO2 93 %.  Filed Weights   01/11/16 1341 01/12/16 0504 01/13/16 0603  Weight: 113 lb 5.1 oz (51.4 kg) 111 lb (50.3 kg) 111 lb 12.8 oz (50.7 kg)    Labs & Radiologic Studies    CBC  Recent Labs  01/11/16 0207 01/12/16 0340  WBC 9.1 9.3  HGB 10.6* 10.4*  HCT 33.7* 32.8*  MCV 96.8 94.5  PLT 159 192   Basic Metabolic Panel  Recent Labs  01/11/16 0207 01/12/16 0340  NA 139 140  K 4.2 3.9  CL 108 108  CO2 25 27  GLUCOSE 99 117*  BUN 11 10  CREATININE 0.79 0.90  CALCIUM 8.9 9.1   Liver Function Tests No results for input(s): AST, ALT, ALKPHOS, BILITOT, PROT, ALBUMIN in the last 72  hours. No results for input(s): LIPASE, AMYLASE in the last 72 hours. Cardiac Enzymes No results for input(s): CKTOTAL, CKMB, CKMBINDEX, TROPONINI in the last 72 hours. BNP Invalid input(s): POCBNP D-Dimer No results for input(s): DDIMER in the last 72 hours. Hemoglobin A1C No results for input(s): HGBA1C in the last 72 hours. Fasting Lipid Panel No results for input(s): CHOL, HDL, LDLCALC, TRIG, CHOLHDL, LDLDIRECT in the last 72 hours. Thyroid Function Tests No results for input(s): TSH, T4TOTAL, T3FREE, THYROIDAB in the last 72 hours.  Invalid input(s): FREET3 _____________  Dg Chest 2 View  Result Date: 01/08/2016 CLINICAL DATA:  Shortness of breath and mid chest pain for 24 hours EXAM: CHEST  2 VIEW COMPARISON:  September 10, 2015 FINDINGS: The mediastinal contour is normal. The heart size is enlarged. Patchy opacity is identified in the right mid lung field. There is no pulmonary edema or pleural effusion. The visualized skeletal structures are stable. IMPRESSION: Patchy  consolidation identified in the right mid lung field, pneumonia is not excluded. Electronically Signed   By: Sherian Rein M.D.   On: 01/08/2016 21:48   Disposition   Pt is being discharged home today in good condition.  Follow-up Plans & Appointments    Follow-up Information    Azalee Course, [place] Follow up on 01/23/2016.   Specialties:  Cardiology, Radiology Why:  at 9:45am for your follow up appt.  Contact information: [address] [place] [address] [phone]          Discharge Instructions    (HEART FAILURE PATIENTS) Call MD:  Anytime you have any of the following symptoms: 1) 3 pound weight gain in 24 hours or 5 pounds in 1 week 2) shortness of breath, with or without a dry hacking cough 3) swelling in the hands, feet or stomach 4) if you have to sleep on extra pillows at night in order to breathe.    Complete by:  As directed    Amb Referral to Cardiac Rehabilitation    Complete by:  As directed    Diagnosis:  NSTEMI Comment - [place]   Call MD for:  redness, tenderness, or signs of infection (pain, swelling, redness, odor or green/yellow discharge around incision site)    Complete by:  As directed    Diet - low sodium heart healthy    Complete by:  As directed    Discharge instructions    Complete by:  As directed    Groin Site Care Refer to this sheet in the next few weeks. These instructions provide you with information on caring for yourself after your procedure. Your caregiver may also give you more specific instructions. Your treatment has been planned according to current medical practices, but problems sometimes occur. Call your caregiver if you have any problems or questions after your procedure. HOME CARE INSTRUCTIONS You may shower 24 hours after the procedure. Remove the bandage (dressing) and gently wash the site with plain soap and water. Gently pat the site dry.  Do not apply powder or lotion to the site.  Do not sit in a bathtub,  swimming pool, or whirlpool for 5 to 7 days.  No bending, squatting, or lifting anything over 10 pounds (4.5 kg) as directed by your caregiver.  Inspect the site at least twice daily.  Do not drive home if you are discharged the same day of the procedure. Have someone else drive you.  You may drive 24 hours after the procedure unless otherwise instructed by your caregiver.  What to expect: Any bruising will usually fade within 1 to 2 weeks.  Blood that collects in the tissue (hematoma) may be painful to the touch. It should usually decrease in size and tenderness within 1 to 2 weeks.  SEEK IMMEDIATE MEDICAL CARE IF: You have unusual pain at the groin site or down the affected leg.  You have redness, warmth, swelling, or pain at the groin site.  You have drainage (other than a small amount of blood on the dressing).  You have chills.  You have a fever or persistent symptoms for more than 72 hours.  You have a fever and your symptoms suddenly get worse.  Your leg becomes pale, cool, tingly, or numb.  You have heavy bleeding from the site. Hold pressure on the site. .   Increase activity slowly    Complete by:  As directed       Discharge Medications   Current Discharge Medication List    START taking these medications   Details  aspirin EC 81 MG EC tablet Take 1 tablet (81 mg total) by mouth daily. Qty: 30 tablet, Refills: 3    atorvastatin (LIPITOR) 40 MG tablet Take 1 tablet (40 mg total) by mouth daily at 6 PM. Qty: 30 tablet, Refills: 6    clopidogrel (PLAVIX) 75 MG tablet Take 1 tablet (75 mg total) by mouth daily. Qty: 30 tablet, Refills: 11    metoprolol tartrate (LOPRESSOR) 25 MG tablet Take 0.5 tablets (12.5 mg total) by mouth 2 (two) times daily. Qty: 60 tablet, Refills: 6    nitroGLYCERIN (NITROSTAT) 0.4 MG SL tablet Place 1 tablet (0.4 mg total) under the tongue every 5 (five) minutes as needed for chest pain. Qty: 25 tablet, Refills: 3    pantoprazole (PROTONIX)  40 MG tablet Take 1 tablet (40 mg total) by mouth 2 (two) times daily. Qty: 30 tablet, Refills: 11      CONTINUE these medications which have NOT CHANGED   Details  ALPRAZolam (XANAX) 0.5 MG tablet Take 0.25 mg by mouth 2 (two) times daily as needed for anxiety.     bimatoprost (LUMIGAN) 0.01 % SOLN Place 1 drop into the left eye at bedtime.     brimonidine (ALPHAGAN P) 0.1 % SOLN Place 1 drop into both eyes 2 (two) times daily.    calcium-vitamin D 250-100 MG-UNIT per tablet Take 1 tablet by mouth 2 (two) times daily.    celecoxib (CELEBREX) 200 MG capsule Take 200 mg by mouth 2 (two) times daily.     citalopram (CELEXA) 20 MG tablet Take 20 mg by mouth every evening.     docusate sodium (COLACE) 100 MG capsule Take 100 mg by mouth 2 (two) times daily.     dorzolamide (TRUSOPT) 2 % ophthalmic solution Place 1 drop into both eyes 2 (two) times daily.    oxyCODONE (OXY IR/ROXICODONE) 5 MG immediate release tablet Take 5 mg by mouth 3 (three) times daily as needed for severe pain or breakthrough pain (pain).     oxymorphone (OPANA ER) 10 MG T12A 12 hr tablet Take 10 mg by mouth every 12 (twelve) hours.    Polyethyl Glycol-Propyl Glycol (SYSTANE OP) Place 1 drop into both eyes 2 (two) times daily.    prednisoLONE acetate (PRED FORTE) 1 % ophthalmic suspension Place 1 drop into the left eye 2 (two) times daily.  Refills: 1    Vitamin D, Cholecalciferol, 1000 UNITS TABS Take 1,000 Units by mouth daily.    ofloxacin (  OCUFLOX) 0.3 % ophthalmic solution Place 1 drop into the left eye 4 (four) times daily. Refills: 1      STOP taking these medications     lansoprazole (PREVACID) 30 MG capsule      lisinopril (PRINIVIL,ZESTRIL) 20 MG tablet          Aspirin prescribed at discharge?  Yes High Intensity Statin Prescribed? (Lipitor 40-80mg  or Crestor 20-40mg ): Yes Beta Blocker Prescribed? Yes For EF <40%, was ACEI/ARB Prescribed? No: EF was ok ADP Receptor Inhibitor Prescribed?  (i.e. Plavix etc.-Includes Medically Managed Patients): Yes For EF <40%, Aldosterone Inhibitor Prescribed? No: No EF ok Was EF assessed during THIS hospitalization? Yes Was Cardiac Rehab II ordered? (Included Medically managed Patients): Yes   Outstanding Labs/Studies   FLP and LFTs in 6-8 weeks if tolerating addition of statin.   Duration of Discharge Encounter   Greater than 30 minutes including physician time.  Signed, Laverda Page NP-C 01/13/2016, 2:54 PM

## 2016-01-14 ENCOUNTER — Encounter
Admission: RE | Admit: 2016-01-14 | Discharge: 2016-01-14 | Disposition: A | Discharge status: Home / Self Care | Payer: Medicare Other | Source: Ambulatory Visit | Attending: Internal Medicine | Admitting: Internal Medicine

## 2016-01-14 NOTE — Telephone Encounter (Signed)
No answer. Left message to call back.   

## 2016-01-15 NOTE — Telephone Encounter (Signed)
Spoke son - in law- states patient is at nursing rehab facility Current number [phone]  Patient contacted regarding discharge from [facility] on 01/13/16.  Patient understands to follow up with provider HAO MENG on 01/23/16 at 10 AM at [facility]. Patient understands discharge instructions? yes  Patient understands medications and regiment? yes  Patient understands to bring all medications to this visit? yes

## 2016-01-20 ENCOUNTER — Encounter: Payer: Self-pay | Admitting: Physician Assistant

## 2016-01-23 ENCOUNTER — Encounter: Payer: Self-pay | Admitting: Physician Assistant

## 2016-01-23 ENCOUNTER — Ambulatory Visit (INDEPENDENT_AMBULATORY_CARE_PROVIDER_SITE_OTHER): Payer: Medicare Other | Admitting: Physician Assistant

## 2016-01-23 VITALS — BP 158/87 | HR 78 | Wt 115.0 lb

## 2016-01-23 DIAGNOSIS — I10 Essential (primary) hypertension: Secondary | ICD-10-CM

## 2016-01-23 DIAGNOSIS — I5189 Other ill-defined heart diseases: Secondary | ICD-10-CM | POA: Diagnosis not present

## 2016-01-23 DIAGNOSIS — I2510 Atherosclerotic heart disease of native coronary artery without angina pectoris: Secondary | ICD-10-CM | POA: Diagnosis not present

## 2016-01-23 NOTE — Patient Instructions (Signed)
Medication Instructions:  Your physician recommends that you continue on your current medications as directed. Please refer to the Current Medication list given to you today.  Labwork: None   Testing/Procedures: none  Follow-Up: Your physician recommends that you schedule a follow-up appointment in: 1-2 MONTHS WITH DR [facility]   Any Other Special Instructions Will Be Listed Below (If Applicable).     If you need a refill on your cardiac medications before your next appointment, please call your pharmacy.

## 2016-01-23 NOTE — Progress Notes (Signed)
Cardiology Office Note    Date:  01/23/2016   ID:  Alexis Yates, DOB [birth year], MRN [id number]  PCP:  Pearla Dubonnet, MD  Cardiologist:  Dr. Herbie Baltimore   Chief Complaint  Patient presents with  . Transitions Of Care    seen for Dr. Herbie Baltimore, 14 day TCM followup.     History of Present Illness:  Alexis Yates is a 82 y.o. female with PMH of HTN, GERD, severe bladder prolapse managed with pessary was recently admitted for NSTEMI. Prior to recent admission, she has no known history of CAD. She presented with burning sensation in her chest and also weakness. Initial EKG showed possible inferior Q waves with T-wave inversion. EKG was 11.76. It was felt that her ACS occurred likely more than 24 hours prior to the presentation which is supported by the EKG which suggest possible out of the hospital inferior infarct. She was taken urgently to the cath lab on the day of arrival, this showed 100% mid RCA occlusion treated with Promus per meter 2.25 x 28 mm DES, ejection fraction 50-55% by visual estimate, apical inferior hypokinesis to akinesis, 40% ostial to proximal LAD lesion, 70% OM1 lesion, 90% ostial D2 lesion which was very small vessel. Post procedure, she was placed on aspirin, Plavix and statin. She was also noted to have loud murmur post cath. Echocardiogram obtained on 01/11/2016 showed EF 60-65%, obstruction at rest in the outflow tract with peak gradient well 8 mmHg, which increased to 138 mmHg with Valsalva, grade 2 diastolic dysfunction, PA peak pressure 35 mmHg. She has been instructed to avoid dehydration and avoid nitroglycerin. Her lisinopril was discontinued due to concern of making obstruction worse with afterload reduction. She was eventually discharged to rehabilitation facility 10/31.  She presents today to cardiology office visit, she has been feeling well without any significant chest discomfort. There is no significant shortness of breath, no lower extremity edema,  orthopnea or PND episodes. She has been compliant with her aspirin and Plavix. She is accompanied by her daughter who teaches descriptive chemistry as [employer] and also her son-in-law. She has been compliant with her medication.   Past Medical History:  Diagnosis Date  . Arthritis   . Atherosclerosis of aorta (HCC)   . CAD (coronary artery disease)    a. 10/27 PCI with DES to RCA, diffuse nonobstructive disease, EF 50-55%  . Diverticulosis   . GERD (gastroesophageal reflux disease)   . Glaucoma   . Hiatal hernia   . Hypertension   . Lung nodules    Right  . Scoliosis     Past Surgical History:  Procedure Laterality Date  . ABDOMINAL HYSTERECTOMY    . CARDIAC CATHETERIZATION N/A 01/09/2016   Procedure: Left Heart Cath and Coronary Angiography;  Surgeon: Marykay Lex, MD;  Location: [facility] INVASIVE CV LAB;  Service: Cardiovascular;  Laterality: N/A;  . CARDIAC CATHETERIZATION N/A 01/09/2016   Procedure: Coronary Stent Intervention;  Surgeon: Marykay Lex, MD;  Location: [facility] INVASIVE CV LAB;  Service: Cardiovascular;  Laterality: N/A;  . EYE SURGERY    . FRACTURE SURGERY     Tibia/fibula of right leg    Current Medications: Outpatient Medications Prior to Visit  Medication Sig Dispense Refill  . ALPRAZolam (XANAX) 0.5 MG tablet Take 0.25 mg by mouth 2 (two) times daily as needed for anxiety.     Marland Kitchen aspirin EC 81 MG EC tablet Take 1 tablet (81 mg total) by mouth daily. 30 tablet 3  .  atorvastatin (LIPITOR) 40 MG tablet Take 1 tablet (40 mg total) by mouth daily at 6 PM. 30 tablet 6  . bimatoprost (LUMIGAN) 0.01 % SOLN Place 1 drop into the left eye at bedtime.     . brimonidine (ALPHAGAN P) 0.1 % SOLN Place 1 drop into both eyes 2 (two) times daily.    . calcium-vitamin D 250-100 MG-UNIT per tablet Take 1 tablet by mouth 2 (two) times daily.    . celecoxib (CELEBREX) 200 MG capsule Take 200 mg by mouth 2 (two) times daily.     . citalopram (CELEXA) 20 MG tablet Take 20 mg by mouth  every evening.     . clopidogrel (PLAVIX) 75 MG tablet Take 1 tablet (75 mg total) by mouth daily. 30 tablet 11  . docusate sodium (COLACE) 100 MG capsule Take 100 mg by mouth 2 (two) times daily.     . dorzolamide (TRUSOPT) 2 % ophthalmic solution Place 1 drop into both eyes 2 (two) times daily.    . metoprolol tartrate (LOPRESSOR) 25 MG tablet Take 0.5 tablets (12.5 mg total) by mouth 2 (two) times daily. 60 tablet 6  . nitroGLYCERIN (NITROSTAT) 0.4 MG SL tablet Place 1 tablet (0.4 mg total) under the tongue every 5 (five) minutes as needed for chest pain. 25 tablet 3  . ofloxacin (OCUFLOX) 0.3 % ophthalmic solution Place 1 drop into the left eye 4 (four) times daily.  1  . oxyCODONE (OXY IR/ROXICODONE) 5 MG immediate release tablet Take 5 mg by mouth 3 (three) times daily as needed for severe pain or breakthrough pain (pain).     Marland Kitchen oxymorphone (OPANA ER) 10 MG T12A 12 hr tablet Take 10 mg by mouth every 12 (twelve) hours.    . pantoprazole (PROTONIX) 40 MG tablet Take 1 tablet (40 mg total) by mouth 2 (two) times daily. 30 tablet 11  . Polyethyl Glycol-Propyl Glycol (SYSTANE OP) Place 1 drop into both eyes 2 (two) times daily.    . prednisoLONE acetate (PRED FORTE) 1 % ophthalmic suspension Place 1 drop into the left eye 2 (two) times daily.   1  . Vitamin D, Cholecalciferol, 1000 UNITS TABS Take 1,000 Units by mouth daily.     No facility-administered medications prior to visit.      Allergies:   Penicillins and Phenergan [promethazine hcl]   Social History   Social History  . Marital status: Widowed    Spouse name: N/A  . Number of children: 2  . Years of education: N/A   Occupational History  . Retired from school system    Social History Main Topics  . Smoking status: Former Games developer  . Smokeless tobacco: Never Used  . Alcohol use No  . Drug use: No  . Sexual activity: Not Asked   Other Topics Concern  . None   Social History Narrative   Widowed.  Lives alone.  Ambulates  with a cane.     Family History:  The patient's family history includes Cancer in her brother; Diabetes in her mother; Heart disease in her father; Heart failure in her mother.   ROS:   Please see the history of present illness.    ROS All other systems reviewed and are negative.   PHYSICAL EXAM:   VS:  BP (!) 158/87   Pulse 78   Wt 115 lb (52.2 kg)   BMI 22.46 kg/m    GEN: Well nourished, well developed, in no acute distress  HEENT: normal  Neck:  no JVD, carotid bruits, or masses Cardiac: RRR; no murmurs, rubs, or gallops,no edema  Respiratory:  clear to auscultation bilaterally, normal work of breathing GI: soft, nontender, nondistended, + BS MS: no deformity or atrophy  Skin: warm and dry, no rash Neuro:  Alert and Oriented x 3, Strength and sensation are intact Psych: euthymic mood, full affect  Wt Readings from Last 3 Encounters:  01/23/16 115 lb (52.2 kg)  01/13/16 111 lb 12.8 oz (50.7 kg)  05/27/14 116 lb 8 oz (52.8 kg)      Studies/Labs Reviewed:   EKG:  EKG is ordered today.  The ekg ordered today demonstrates Normal sinus rhythm T wave inversion in lead 3 and aVF, mild J-point elevation also in lead 3 and aVF unchanged from last EKG.  Recent Labs: 01/09/2016: TSH 1.467 01/12/2016: BUN 10; Creatinine, Ser 0.90; Hemoglobin 10.4; Platelets 192; Potassium 3.9; Sodium 140   Lipid Panel    Component Value Date/Time   CHOL 148 01/09/2016 0523   TRIG 21 01/09/2016 0523   HDL 75 01/09/2016 0523   CHOLHDL 2.0 01/09/2016 0523   VLDL 4 01/09/2016 0523   LDLCALC 69 01/09/2016 0523    Additional studies/ records that were reviewed today include:   Cath 01/09/2016 Conclusion     Mid RCA lesion, 100 %stenosed.  A STENT PROMUS PREM MR 2.25X28 drug eluting stent was successfully placed.  Post intervention, there is a 0% residual stenosis.  The left ventricular ejection fraction is 50-55% by visual estimate. Apical Inferior Hypo to Akinesis.  Residual  CAD  Ost LAD to Prox LAD lesion, 40 %stenosed. Mid LAD lesion, 30 %stenosed.  1st Mrg lesion, 70 %stenosed. Very small vessel. 2nd Mrg lesion, 30 %stenosed.  Ost 2nd Diag to 2nd Diag lesion, 90 %stenosed. Very small vessel.   I suspect the patient was having stuttering symptoms from her RCA disease and now is occluded. Very difficult PCI procedure. There was extensive clot in the artery and I had to do several balloon inflations to achieve TIMI 2-3 flow distally. Initial thoughts were to do PTCA only basilar sized vessel, however there was a hazy segment at the original occlusion site that I felt was best covered with a stent.  The lesion was successfully treated with a DES stent. He was loaded with Plavix (initial thoughts were that she may be a twilight candidate, however this appears to be not the case) -- we will need to change orders back to continue Plavix tomorrow morning and not load Brilinta.   Plan:  Turn to CCU for ongoing care. Sheath removal per protocol. Would continue aspirin plus Plavix for 1 year. Cardiac risk factor modification - beta blocker, statin     Echo 01/11/2016 LV EF: 60% -   65%  - Left ventricle: The cavity size was normal. There was moderate   focal basal hypertrophy. Systolic function was normal. The   estimated ejection fraction was in the range of 60% to 65%. There   was dynamic obstruction at restin the outflow tract, with a peak   gradient of 108 mm Hg. There was dynamic obstruction during   Valsalvain the outflow tract, with a peak gradient of 138 mm Hg.   Wall motion was normal; there were no regional wall motion   abnormalities. Features are consistent with a pseudonormal left   ventricular filling pattern, with concomitant abnormal relaxation   and increased filling pressure (grade 2 diastolic dysfunction).   Doppler parameters are consistent with high ventricular filling  pressure. - Aortic valve: Trileaflet; mildly thickened, mildly  calcified   leaflets. There was trivial regurgitation. - Mitral valve: Moderately calcified annulus. Mild focal   calcification of the anterior leaflet (lateral segment(s)) and   posterior leaflet (lateral scallop(s)). There was severe systolic   anterior motion of the anterior leaflet. There was mild   regurgitation. - Left atrium: The atrium was mildly dilated. - Atrial septum: There was increased thickness of the septum,   consistent with lipomatous hypertrophy. - Pulmonary arteries: PA peak pressure: 35 mm Hg (S).  Impressions:  - The right ventricular systolic pressure was increased consistent   with mild pulmonary hypertension.   ASSESSMENT:    1. Coronary artery disease involving native coronary artery of native heart without angina pectoris   2. Essential hypertension   3. Dynamic left ventricular outflow obstruction      PLAN:  In order of problems listed above:  1. CAD: No further chest discomfort, currently on aspirin and Plavix given recent stent. Surprisingly her cholesterol profile obtained on 01/09/2016 were all normal.  2. HTN: Blood pressure is mildly elevated today, however on recheck blood pressure has came down to 110s systolic.  3. Dynamic LV outflow obstruction: She has no heart murmur for many years, recent echocardiogram showed significant LV outflow obstruction. Currently on medical therapy. Does not have any significant dizziness or presyncope. Avoid ACE inhibitor or nitroglycerin in this case.    Medication Adjustments/Labs and Tests Ordered: Current medicines are reviewed at length with the patient today.  Concerns regarding medicines are outlined above.  Medication changes, Labs and Tests ordered today are listed in the Patient Instructions below. Patient Instructions  Medication Instructions:  Your physician recommends that you continue on your current medications as directed. Please refer to the Current Medication list given to you  today.  Labwork: None   Testing/Procedures: none  Follow-Up: Your physician recommends that you schedule a follow-up appointment in: 1-2 MONTHS [facility]   Any Other Special Instructions Will Be Listed Below (If Applicable).     If you need a refill on your cardiac medications before your next appointment, please call your pharmacy.     Ramond DialSigned, Adison Jerger, [place]  01/23/2016 11:31 PM    [facility] [address], [place], [place]  [address] Phone: [phone]; Fax: [phone]

## 2016-02-28 ENCOUNTER — Encounter (HOSPITAL_COMMUNITY): Payer: Self-pay | Admitting: *Deleted

## 2016-02-28 DIAGNOSIS — N390 Urinary tract infection, site not specified: Secondary | ICD-10-CM | POA: Diagnosis present

## 2016-02-28 DIAGNOSIS — Z880 Allergy status to penicillin: Secondary | ICD-10-CM

## 2016-02-28 DIAGNOSIS — Z79899 Other long term (current) drug therapy: Secondary | ICD-10-CM

## 2016-02-28 DIAGNOSIS — M419 Scoliosis, unspecified: Secondary | ICD-10-CM | POA: Diagnosis present

## 2016-02-28 DIAGNOSIS — H409 Unspecified glaucoma: Secondary | ICD-10-CM | POA: Diagnosis present

## 2016-02-28 DIAGNOSIS — T373X5A Adverse effect of other antiprotozoal drugs, initial encounter: Secondary | ICD-10-CM | POA: Diagnosis not present

## 2016-02-28 DIAGNOSIS — R112 Nausea with vomiting, unspecified: Secondary | ICD-10-CM | POA: Diagnosis not present

## 2016-02-28 DIAGNOSIS — K5732 Diverticulitis of large intestine without perforation or abscess without bleeding: Principal | ICD-10-CM | POA: Diagnosis present

## 2016-02-28 DIAGNOSIS — K219 Gastro-esophageal reflux disease without esophagitis: Secondary | ICD-10-CM | POA: Diagnosis present

## 2016-02-28 DIAGNOSIS — Z7902 Long term (current) use of antithrombotics/antiplatelets: Secondary | ICD-10-CM

## 2016-02-28 DIAGNOSIS — E860 Dehydration: Secondary | ICD-10-CM | POA: Diagnosis not present

## 2016-02-28 DIAGNOSIS — Z888 Allergy status to other drugs, medicaments and biological substances status: Secondary | ICD-10-CM

## 2016-02-28 DIAGNOSIS — I252 Old myocardial infarction: Secondary | ICD-10-CM

## 2016-02-28 DIAGNOSIS — Z87891 Personal history of nicotine dependence: Secondary | ICD-10-CM

## 2016-02-28 DIAGNOSIS — I10 Essential (primary) hypertension: Secondary | ICD-10-CM | POA: Diagnosis present

## 2016-02-28 DIAGNOSIS — R109 Unspecified abdominal pain: Secondary | ICD-10-CM | POA: Diagnosis not present

## 2016-02-28 DIAGNOSIS — I2510 Atherosclerotic heart disease of native coronary artery without angina pectoris: Secondary | ICD-10-CM | POA: Diagnosis present

## 2016-02-28 DIAGNOSIS — Z7982 Long term (current) use of aspirin: Secondary | ICD-10-CM

## 2016-02-28 DIAGNOSIS — Z90710 Acquired absence of both cervix and uterus: Secondary | ICD-10-CM

## 2016-02-28 DIAGNOSIS — Y92239 Unspecified place in hospital as the place of occurrence of the external cause: Secondary | ICD-10-CM | POA: Diagnosis not present

## 2016-02-28 LAB — URINALYSIS, MICROSCOPIC (REFLEX)

## 2016-02-28 LAB — COMPREHENSIVE METABOLIC PANEL
ALBUMIN: 3.6 g/dL (ref 3.5–5.0)
ALK PHOS: 76 U/L (ref 38–126)
ALT: 66 U/L — ABNORMAL HIGH (ref 14–54)
AST: 57 U/L — AB (ref 15–41)
Anion gap: 8 (ref 5–15)
BILIRUBIN TOTAL: 0.4 mg/dL (ref 0.3–1.2)
BUN: 19 mg/dL (ref 6–20)
CO2: 26 mmol/L (ref 22–32)
Calcium: 9.4 mg/dL (ref 8.9–10.3)
Chloride: 103 mmol/L (ref 101–111)
Creatinine, Ser: 0.89 mg/dL (ref 0.44–1.00)
GFR calc Af Amer: >60 mL/min (ref >60)
GFR calc non Af Amer: 57 mL/min — ABNORMAL LOW (ref >60)
GLUCOSE: 120 mg/dL — AB (ref 65–99)
POTASSIUM: 4.3 mmol/L (ref 3.5–5.1)
Sodium: 137 mmol/L (ref 135–145)
TOTAL PROTEIN: 6.6 g/dL (ref 6.5–8.1)

## 2016-02-28 LAB — URINALYSIS, ROUTINE W REFLEX MICROSCOPIC
BILIRUBIN URINE: NEGATIVE
GLUCOSE, UA: NEGATIVE mg/dL
HGB URINE DIPSTICK: NEGATIVE
KETONES UR: NEGATIVE mg/dL
NITRITE: NEGATIVE
PH: 5.5 (ref 5.0–8.0)
Protein, ur: NEGATIVE mg/dL
Specific Gravity, Urine: 1.015 (ref 1.005–1.030)

## 2016-02-28 LAB — CBC
HEMATOCRIT: 35.3 % — AB (ref 36.0–46.0)
Hemoglobin: 11.4 g/dL — ABNORMAL LOW (ref 12.0–15.0)
MCH: 29.9 pg (ref 26.0–34.0)
MCHC: 32.3 g/dL (ref 30.0–36.0)
MCV: 92.7 fL (ref 78.0–100.0)
Platelets: 276 10*3/uL (ref 150–400)
RBC: 3.81 MIL/uL — ABNORMAL LOW (ref 3.87–5.11)
RDW: 14.1 % (ref 11.5–15.5)
WBC: 11.1 10*3/uL — ABNORMAL HIGH (ref 4.0–10.5)

## 2016-02-28 LAB — LIPASE, BLOOD: Lipase: 33 U/L (ref 11–51)

## 2016-02-28 NOTE — ED Triage Notes (Signed)
Pt is here for evaluation of bilateral lower abdominal pain with pinching and cramping and burning sensation.  Pt recently completed cipro for suspected UTI and symptoms have gotten worse. Pt has generalized weakness with this also.  Pt had LBM today. Pt reports concern for UTI and vaginal infection as she has a pessary in place to help hold her bladder in place and wonders if this is contributing to her problems (she has had this in place for 6 years however)

## 2016-02-29 ENCOUNTER — Inpatient Hospital Stay (HOSPITAL_COMMUNITY)
Admission: EM | Admit: 2016-02-29 | Discharge: 2016-03-04 | DRG: 392 | Disposition: A | Discharge status: Home / Self Care | Payer: Medicare Other | Attending: Family Medicine | Admitting: Family Medicine

## 2016-02-29 ENCOUNTER — Emergency Department (HOSPITAL_COMMUNITY): Payer: Medicare Other

## 2016-02-29 DIAGNOSIS — Z7902 Long term (current) use of antithrombotics/antiplatelets: Secondary | ICD-10-CM | POA: Diagnosis not present

## 2016-02-29 DIAGNOSIS — E860 Dehydration: Secondary | ICD-10-CM | POA: Diagnosis not present

## 2016-02-29 DIAGNOSIS — N3000 Acute cystitis without hematuria: Secondary | ICD-10-CM | POA: Diagnosis not present

## 2016-02-29 DIAGNOSIS — I252 Old myocardial infarction: Secondary | ICD-10-CM | POA: Diagnosis not present

## 2016-02-29 DIAGNOSIS — I2510 Atherosclerotic heart disease of native coronary artery without angina pectoris: Secondary | ICD-10-CM | POA: Diagnosis present

## 2016-02-29 DIAGNOSIS — I959 Hypotension, unspecified: Secondary | ICD-10-CM | POA: Diagnosis not present

## 2016-02-29 DIAGNOSIS — Z87891 Personal history of nicotine dependence: Secondary | ICD-10-CM | POA: Diagnosis not present

## 2016-02-29 DIAGNOSIS — K5732 Diverticulitis of large intestine without perforation or abscess without bleeding: Principal | ICD-10-CM

## 2016-02-29 DIAGNOSIS — N390 Urinary tract infection, site not specified: Secondary | ICD-10-CM | POA: Diagnosis present

## 2016-02-29 DIAGNOSIS — Z880 Allergy status to penicillin: Secondary | ICD-10-CM | POA: Diagnosis not present

## 2016-02-29 DIAGNOSIS — Y92239 Unspecified place in hospital as the place of occurrence of the external cause: Secondary | ICD-10-CM | POA: Diagnosis not present

## 2016-02-29 DIAGNOSIS — K219 Gastro-esophageal reflux disease without esophagitis: Secondary | ICD-10-CM | POA: Diagnosis present

## 2016-02-29 DIAGNOSIS — Z90710 Acquired absence of both cervix and uterus: Secondary | ICD-10-CM | POA: Diagnosis not present

## 2016-02-29 DIAGNOSIS — Z888 Allergy status to other drugs, medicaments and biological substances status: Secondary | ICD-10-CM | POA: Diagnosis not present

## 2016-02-29 DIAGNOSIS — Z79899 Other long term (current) drug therapy: Secondary | ICD-10-CM | POA: Diagnosis not present

## 2016-02-29 DIAGNOSIS — R109 Unspecified abdominal pain: Secondary | ICD-10-CM | POA: Diagnosis present

## 2016-02-29 DIAGNOSIS — W19XXXD Unspecified fall, subsequent encounter: Secondary | ICD-10-CM | POA: Diagnosis not present

## 2016-02-29 DIAGNOSIS — T373X5A Adverse effect of other antiprotozoal drugs, initial encounter: Secondary | ICD-10-CM | POA: Diagnosis not present

## 2016-02-29 DIAGNOSIS — R112 Nausea with vomiting, unspecified: Secondary | ICD-10-CM | POA: Diagnosis not present

## 2016-02-29 DIAGNOSIS — Z7982 Long term (current) use of aspirin: Secondary | ICD-10-CM | POA: Diagnosis not present

## 2016-02-29 DIAGNOSIS — K5792 Diverticulitis of intestine, part unspecified, without perforation or abscess without bleeding: Secondary | ICD-10-CM | POA: Diagnosis present

## 2016-02-29 DIAGNOSIS — H409 Unspecified glaucoma: Secondary | ICD-10-CM | POA: Diagnosis present

## 2016-02-29 DIAGNOSIS — G894 Chronic pain syndrome: Secondary | ICD-10-CM | POA: Diagnosis not present

## 2016-02-29 DIAGNOSIS — M419 Scoliosis, unspecified: Secondary | ICD-10-CM | POA: Diagnosis present

## 2016-02-29 DIAGNOSIS — I10 Essential (primary) hypertension: Secondary | ICD-10-CM | POA: Diagnosis present

## 2016-02-29 HISTORY — DX: Acute myocardial infarction, unspecified: I21.9

## 2016-02-29 MED ORDER — ATORVASTATIN CALCIUM 40 MG PO TABS
40.0000 mg | ORAL_TABLET | Freq: Every day | ORAL | Status: DC
  Administered 2016-02-29 – 2016-03-03 (×4): 40 mg via ORAL
  Filled 2016-02-29 (×4): qty 1

## 2016-02-29 MED ORDER — METRONIDAZOLE 500 MG PO TABS
500.0000 mg | ORAL_TABLET | Freq: Three times a day (TID) | ORAL | Status: DC
  Administered 2016-02-29 – 2016-03-01 (×3): 500 mg via ORAL
  Filled 2016-02-29 (×3): qty 1

## 2016-02-29 MED ORDER — OXYCODONE HCL 5 MG PO TABS
5.0000 mg | ORAL_TABLET | ORAL | Status: DC | PRN
  Administered 2016-02-29 – 2016-03-04 (×10): 5 mg via ORAL
  Filled 2016-02-29 (×10): qty 1

## 2016-02-29 MED ORDER — SULFAMETHOXAZOLE-TRIMETHOPRIM 800-160 MG PO TABS
1.0000 | ORAL_TABLET | Freq: Two times a day (BID) | ORAL | Status: DC
  Administered 2016-02-29 – 2016-03-01 (×3): 1 via ORAL
  Filled 2016-02-29 (×3): qty 1

## 2016-02-29 MED ORDER — ENOXAPARIN SODIUM 30 MG/0.3ML ~~LOC~~ SOLN
30.0000 mg | SUBCUTANEOUS | Status: DC
  Administered 2016-02-29 – 2016-03-03 (×4): 30 mg via SUBCUTANEOUS
  Filled 2016-02-29 (×4): qty 0.3

## 2016-02-29 MED ORDER — MORPHINE SULFATE ER 30 MG PO TBCR
30.0000 mg | EXTENDED_RELEASE_TABLET | Freq: Two times a day (BID) | ORAL | Status: DC
  Administered 2016-02-29 – 2016-03-04 (×9): 30 mg via ORAL
  Filled 2016-02-29 (×9): qty 1

## 2016-02-29 MED ORDER — OXYCODONE HCL 5 MG PO TABS: 5.0000 mg | ORAL_TABLET | Freq: Four times a day (QID) | ORAL | Status: DC | PRN

## 2016-02-29 MED ORDER — LATANOPROST 0.005 % OP SOLN
1.0000 [drp] | Freq: Every day | OPHTHALMIC | Status: DC
  Administered 2016-02-29 – 2016-03-03 (×2): 1 [drp] via OPHTHALMIC
  Filled 2016-02-29: qty 2.5

## 2016-02-29 MED ORDER — PANTOPRAZOLE SODIUM 40 MG PO TBEC
40.0000 mg | DELAYED_RELEASE_TABLET | Freq: Two times a day (BID) | ORAL | Status: DC
  Administered 2016-02-29 – 2016-03-04 (×9): 40 mg via ORAL
  Filled 2016-02-29 (×9): qty 1

## 2016-02-29 MED ORDER — ALPRAZOLAM 0.25 MG PO TABS
0.2500 mg | ORAL_TABLET | Freq: Two times a day (BID) | ORAL | Status: DC | PRN
  Administered 2016-03-02: 0.25 mg via ORAL
  Filled 2016-02-29: qty 1

## 2016-02-29 MED ORDER — CITALOPRAM HYDROBROMIDE 20 MG PO TABS
20.0000 mg | ORAL_TABLET | Freq: Every evening | ORAL | Status: DC
  Administered 2016-02-29 – 2016-03-03 (×4): 20 mg via ORAL
  Filled 2016-02-29 (×5): qty 1

## 2016-02-29 MED ORDER — CELECOXIB 200 MG PO CAPS
200.0000 mg | ORAL_CAPSULE | Freq: Two times a day (BID) | ORAL | Status: DC
  Filled 2016-02-29: qty 1

## 2016-02-29 MED ORDER — LACTATED RINGERS IV SOLN
INTRAVENOUS | Status: DC
  Administered 2016-02-29: 1000 mL via INTRAVENOUS
  Administered 2016-03-01: 05:00:00 via INTRAVENOUS

## 2016-02-29 MED ORDER — ONDANSETRON HCL 4 MG/2ML IJ SOLN
4.0000 mg | Freq: Once | INTRAMUSCULAR | Status: AC
  Administered 2016-02-29: 4 mg via INTRAVENOUS
  Filled 2016-02-29: qty 2

## 2016-02-29 MED ORDER — OXYCODONE HCL 5 MG PO TABS: 5.0000 mg | ORAL_TABLET | Freq: Three times a day (TID) | ORAL | Status: DC | PRN

## 2016-02-29 MED ORDER — OFLOXACIN 0.3 % OP SOLN
1.0000 [drp] | Freq: Four times a day (QID) | OPHTHALMIC | Status: DC
  Filled 2016-02-29: qty 5

## 2016-02-29 MED ORDER — HYPROMELLOSE (GONIOSCOPIC) 2.5 % OP SOLN
Freq: Two times a day (BID) | OPHTHALMIC | Status: DC
  Filled 2016-02-29: qty 15

## 2016-02-29 MED ORDER — IOPAMIDOL (ISOVUE-300) INJECTION 61%
INTRAVENOUS | Status: AC
  Administered 2016-02-29: 100 mL
  Filled 2016-02-29: qty 100

## 2016-02-29 MED ORDER — PREDNISOLONE ACETATE 1 % OP SUSP
1.0000 [drp] | Freq: Two times a day (BID) | OPHTHALMIC | Status: DC
  Administered 2016-02-29: 1 [drp] via OPHTHALMIC
  Filled 2016-02-29: qty 1

## 2016-02-29 MED ORDER — DORZOLAMIDE HCL 2 % OP SOLN
1.0000 [drp] | Freq: Two times a day (BID) | OPHTHALMIC | Status: DC
  Administered 2016-02-29 – 2016-03-04 (×9): 1 [drp] via OPHTHALMIC
  Filled 2016-02-29: qty 10

## 2016-02-29 MED ORDER — DOCUSATE SODIUM 100 MG PO CAPS
100.0000 mg | ORAL_CAPSULE | Freq: Two times a day (BID) | ORAL | Status: DC
Start: 2016-02-29 — End: 2016-03-04
  Administered 2016-02-29 – 2016-03-04 (×9): 100 mg via ORAL
  Filled 2016-02-29 (×10): qty 1

## 2016-02-29 MED ORDER — ASPIRIN EC 81 MG PO TBEC
81.0000 mg | DELAYED_RELEASE_TABLET | Freq: Every day | ORAL | Status: DC
  Administered 2016-02-29 – 2016-03-04 (×5): 81 mg via ORAL
  Filled 2016-02-29 (×5): qty 1

## 2016-02-29 MED ORDER — CELECOXIB 200 MG PO CAPS
200.0000 mg | ORAL_CAPSULE | Freq: Every day | ORAL | Status: DC
  Administered 2016-03-02 – 2016-03-03 (×2): 200 mg via ORAL
  Filled 2016-02-29 (×6): qty 1

## 2016-02-29 MED ORDER — SENNA 8.6 MG PO TABS
2.0000 | ORAL_TABLET | Freq: Every day | ORAL | Status: DC
  Administered 2016-02-29 – 2016-03-03 (×4): 17.2 mg via ORAL
  Filled 2016-02-29 (×4): qty 2

## 2016-02-29 MED ORDER — MORPHINE SULFATE (PF) 4 MG/ML IV SOLN
2.0000 mg | Freq: Once | INTRAVENOUS | Status: AC
  Administered 2016-02-29: 2 mg via INTRAVENOUS
  Filled 2016-02-29: qty 1

## 2016-02-29 MED ORDER — PREDNISOLONE ACETATE 1 % OP SUSP
1.0000 [drp] | Freq: Two times a day (BID) | OPHTHALMIC | Status: DC
  Administered 2016-03-01 – 2016-03-04 (×7): 1 [drp] via OPHTHALMIC
  Filled 2016-02-29: qty 1

## 2016-02-29 MED ORDER — CLOPIDOGREL BISULFATE 75 MG PO TABS
75.0000 mg | ORAL_TABLET | Freq: Every day | ORAL | Status: DC
  Administered 2016-02-29 – 2016-03-04 (×5): 75 mg via ORAL
  Filled 2016-02-29 (×5): qty 1

## 2016-02-29 MED ORDER — BRIMONIDINE TARTRATE 0.15 % OP SOLN
1.0000 [drp] | Freq: Two times a day (BID) | OPHTHALMIC | Status: DC
  Filled 2016-02-29: qty 5

## 2016-02-29 NOTE — ED Provider Notes (Signed)
[facility]-EMER[facility]Y DEPT Provider Note   CSN: [id number] Arrival date & time: 02/28/16  2110  By signing my name below, I, Suzan SlickAshley N. Elon SpannerLeger, attest that this documentation has been prepared under the direction and in the presence of Gilda Creasehristopher J Ahmar Pickrell, MD.  Electronically Signed: Suzan SlickAshley N. Elon SpannerLeger, ED Scribe. 02/29/16. 12:42 AM.    History   Chief Complaint Chief Complaint  Patient presents with  . Abdominal Pain   The history is provided by the patient. No language interpreter was used.    HPI Comments: Alexis Yates is a 82 y.o. female with a PMHx of CAD, diverticulosis, GERD, and HTN who presents to the Emergency Department complaining of constant, unchanged lower abdominal pain x 1-2 days. Pain is described as pinching, burning, and cramping. No aggravating reported at this time. However, pt states abdominal discomfort occasionally improves when sitting. She also reported intermittent "heartburn" noted after eating which began after recent MI 01/09/16. No OTC/prescribed medications or home remedies attempted prior to arrival. However, pt recently completed a course of Cipro for suspected signs of a UTI. Last bowel movement earlier today. No recent fever, chills, nausea, vomiting, chest pain, or shortness of breath.  PCP: Pearla DubonnetGATES,ROBERT NEVILL, MD    Past Medical History:  Diagnosis Date  . Arthritis   . Atherosclerosis of aorta (HCC)   . CAD (coronary artery disease)    a. 10/27 PCI with DES to RCA, diffuse nonobstructive disease, EF 50-55%  . Diverticulosis   . GERD (gastroesophageal reflux disease)   . Glaucoma   . Hiatal hernia   . Hypertension   . Lung nodules    Right  . Scoliosis     Patient Active Problem List   Diagnosis Date Noted  . ST elevation myocardial infarction (STEMI) of inferior wall, initial episode of care (HCC) 01/09/2016  . Urinary tract infection without hematuria   . NSTEMI (non-ST elevated myocardial infarction) (HCC) 01/08/2016  . UTI (urinary  tract infection) 05/27/2014  . PID (acute pelvic inflammatory disease) 05/27/2014  . Vaginal inflammation from pessary (HCC) 05/27/2014  . Leukocytosis 05/27/2014  . Nausea vomiting and diarrhea 05/27/2014  . Hypertension   . Hiatal hernia   . Lung nodules   . Glaucoma   . GERD (gastroesophageal reflux disease)     Past Surgical History:  Procedure Laterality Date  . ABDOMINAL HYSTERECTOMY    . CARDIAC CATHETERIZATION N/A 01/09/2016   Procedure: Left Heart Cath and Coronary Angiography;  Surgeon: Marykay Lexavid W Harding, MD;  [facility];  Service: Cardiovascular;  Laterality: N/A;  . CARDIAC CATHETERIZATION N/A 01/09/2016   Procedure: Coronary Stent Intervention;  Surgeon: Marykay Lexavid W Harding, MD;  [facility];  Service: Cardiovascular;  Laterality: N/A;  . EYE SURGERY    . FRACTURE SURGERY     Tibia/fibula of right leg    OB History    No data available       Home Medications    Prior to Admission medications   Medication Sig Start Date End Date Taking? Authorizing Provider  ALPRAZolam Prudy Feeler(XANAX) 0.5 MG tablet Take 0.25 mg by mouth 2 (two) times daily as needed for anxiety.     Historical Provider, MD  aspirin EC 81 MG EC tablet Take 1 tablet (81 mg total) by mouth daily. 01/14/16   Arty BaumgartnerLindsay B Roberts, NP  atorvastatin (LIPITOR) 40 MG tablet Take 1 tablet (40 mg total) by mouth daily at 6 PM. 01/13/16   Arty BaumgartnerLindsay B Roberts, NP  bimatoprost (  LUMIGAN) 0.01 % SOLN Place 1 drop into the left eye at bedtime.     Historical Provider, MD  brimonidine (ALPHAGAN P) 0.1 % SOLN Place 1 drop into both eyes 2 (two) times daily.    Historical Provider, MD  calcium-vitamin D 250-100 MG-UNIT per tablet Take 1 tablet by mouth 2 (two) times daily.    Historical Provider, MD  celecoxib (CELEBREX) 200 MG capsule Take 200 mg by mouth 2 (two) times daily.     Historical Provider, MD  citalopram (CELEXA) 20 MG tablet Take 20 mg by mouth every evening.     Historical Provider, MD    clopidogrel (PLAVIX) 75 MG tablet Take 1 tablet (75 mg total) by mouth daily. 01/14/16   Arty Baumgartner, NP  docusate sodium (COLACE) 100 MG capsule Take 100 mg by mouth 2 (two) times daily.     Historical Provider, MD  dorzolamide (TRUSOPT) 2 % ophthalmic solution Place 1 drop into both eyes 2 (two) times daily.    Historical Provider, MD  metoprolol tartrate (LOPRESSOR) 25 MG tablet Take 0.5 tablets (12.5 mg total) by mouth 2 (two) times daily. 01/13/16   Arty Baumgartner, NP  nitroGLYCERIN (NITROSTAT) 0.4 MG SL tablet Place 1 tablet (0.4 mg total) under the tongue every 5 (five) minutes as needed for chest pain. 01/13/16   Arty Baumgartner, NP  ofloxacin (OCUFLOX) 0.3 % ophthalmic solution Place 1 drop into the left eye 4 (four) times daily. 01/08/15   Historical Provider, MD  oxyCODONE (OXY IR/ROXICODONE) 5 MG immediate release tablet Take 5 mg by mouth 3 (three) times daily as needed for severe pain or breakthrough pain (pain).     Historical Provider, MD  oxymorphone (OPANA ER) 10 MG T12A 12 hr tablet Take 10 mg by mouth every 12 (twelve) hours.    Historical Provider, MD  pantoprazole (PROTONIX) 40 MG tablet Take 1 tablet (40 mg total) by mouth 2 (two) times daily. 01/13/16   Arty Baumgartner, NP  Polyethyl Glycol-Propyl Glycol (SYSTANE OP) Place 1 drop into both eyes 2 (two) times daily.    Historical Provider, MD  prednisoLONE acetate (PRED FORTE) 1 % ophthalmic suspension Place 1 drop into the left eye 2 (two) times daily.  01/08/15   Historical Provider, MD  Vitamin D, Cholecalciferol, 1000 UNITS TABS Take 1,000 Units by mouth daily.    Historical Provider, MD    Family History Family History  Problem Relation Age of Onset  . Cancer Brother     Stomach  . Heart disease Father     Died age 9  . Diabetes Mother   . Heart failure Mother     Died 23    Social History Social History  Substance Use Topics  . Smoking status: Former Games developer  . Smokeless tobacco: Never Used  .  Alcohol use No     Allergies   Penicillins and Phenergan [promethazine hcl]   Review of Systems Review of Systems  Constitutional: Negative for chills and fever.  Respiratory: Negative for shortness of breath.   Cardiovascular: Negative for chest pain.  Gastrointestinal: Positive for abdominal pain. Negative for diarrhea, nausea and vomiting.  Neurological: Negative for headaches.  Psychiatric/Behavioral: Negative for confusion.  All other systems reviewed and are negative.    Physical Exam Updated Vital Signs BP 108/63   Pulse 80   Temp 98.3 F (36.8 C) (Oral)   Resp 11   Wt 111 lb (50.3 kg)   SpO2 94%  BMI 21.68 kg/m   Physical Exam  Constitutional: She is oriented to person, place, and time. She appears well-developed and well-nourished. No distress.  HENT:  Head: Normocephalic and atraumatic.  Right Ear: Hearing normal.  Left Ear: Hearing normal.  Nose: Nose normal.  Mouth/Throat: Oropharynx is clear and moist and mucous membranes are normal.  Eyes: Conjunctivae and EOM are normal. Pupils are equal, round, and reactive to light.  Neck: Normal range of motion. Neck supple.  Cardiovascular: Regular rhythm, S1 normal and S2 normal.  Exam reveals no gallop and no friction rub.   No murmur heard. Pulmonary/Chest: Effort normal and breath sounds normal. No respiratory distress. She exhibits no tenderness.  Abdominal: Soft. Normal appearance and bowel sounds are normal. There is no hepatosplenomegaly. There is tenderness. There is guarding. There is no rebound, no tenderness at McBurney's point and negative Murphy's sign. No hernia.  LLQ tenderness with some guarding.  Musculoskeletal: Normal range of motion.  Neurological: She is alert and oriented to person, place, and time. She has normal strength. No cranial nerve deficit or sensory deficit. Coordination normal. GCS eye subscore is 4. GCS verbal subscore is 5. GCS motor subscore is 6.  Skin: Skin is warm, dry and  intact. No rash noted. No cyanosis.  Psychiatric: She has a normal mood and affect. Her speech is normal and behavior is normal. Thought content normal.  Nursing note and vitals reviewed.    ED Treatments / Results   DIAGNOSTIC STUDIES: Oxygen Saturation is 95% on RA, adequate by my interpretation.    COORDINATION OF CARE: 12:31 AM- Will order blood work and urinalysis. Discussed treatment plan with pt at bedside and pt agreed to plan.     Labs (all labs ordered are listed, but only abnormal results are displayed) Labs Reviewed  COMPREHENSIVE METABOLIC PANEL - Abnormal; Notable for the following:       Result Value   Glucose, Bld 120 (*)    AST 57 (*)    ALT 66 (*)    GFR calc non Af Amer 57 (*)    All other components within normal limits  CBC - Abnormal; Notable for the following:    WBC 11.1 (*)    RBC 3.81 (*)    Hemoglobin 11.4 (*)    HCT 35.3 (*)    All other components within normal limits  URINALYSIS, ROUTINE W REFLEX MICROSCOPIC - Abnormal; Notable for the following:    Leukocytes, UA MODERATE (*)    All other components within normal limits  URINALYSIS, MICROSCOPIC (REFLEX) - Abnormal; Notable for the following:    Bacteria, UA MANY (*)    Squamous Epithelial / LPF 0-5 (*)    All other components within normal limits  URINE CULTURE  LIPASE, BLOOD    EKG  EKG Interpretation  Date/Time:  Sunday February 29 2016 00:47:05 EST Ventricular Rate:  70 PR Interval:    QRS Duration: 91 QT Interval:  428 QTC Calculation: 462 R Axis:   -12 Text Interpretation:  Sinus rhythm Nonspecific T abnormalities, inferior leads No significant change since last tracing Confirmed by Nijah Tejera  MD, Sedonia Kitner [phone]) on 02/29/2016 12:57:58 AM       Radiology Ct Abdomen Pelvis W Contrast  Result Date: 02/29/2016 CLINICAL DATA:  82 year old female with left lower quadrant abdominal pain. EXAM: CT ABDOMEN AND PELVIS WITH CONTRAST TECHNIQUE: Multidetector CT imaging of the  abdomen and pelvis was performed using the standard protocol following bolus administration of intravenous contrast. CONTRAST:   ISOVUE-300 IOPAMIDOL (ISOVUE-300) INJECTION 61% COMPARISON:  None. FINDINGS: Lower chest: The visualized lung bases are clear. There is coronary vascular calcification. No intra-abdominal free air or free fluid Hepatobiliary: No focal liver abnormality is seen. No gallstones, gallbladder wall thickening, or biliary dilatation. Pancreas: Unremarkable. No pancreatic ductal dilatation or surrounding inflammatory changes. Spleen: Normal in size without focal abnormality. Adrenals/Urinary Tract: The adrenal glands, kidneys, visualized ureters, and urinary bladder appear unremarkable. Stomach/Bowel: There is extensive sigmoid and descending colon diverticulosis with muscular hypertrophy. Mild pericolonic haziness along the descending colon may be related to chronic inflammation/ scarring, mild acute diverticulitis is not entirely excluded. Clinical correlation is recommended. There is no evidence of diverticular perforation or abscess. Moderate stool noted in the proximal colon. No evidence of bowel obstruction. Normal appendix. Vascular/Lymphatic: There is advanced aortoiliac atherosclerotic disease. No aneurysmal dilatation or evidence of dissection. The origins of the celiac axis is patent. There is chronic appearing thrombus or noncalcified plaque at the origin of the SMA with focal narrowing of the vessel. The SMA however remains patent. The IMA is patent as well. The SMV, splenic vein, and main portal veins are patent. No portal venous gas identified. There is no adenopathy. Reproductive: Hysterectomy.  A pessary is noted within the pelvis. Other: None Musculoskeletal: Osteopenia with scoliosis and degenerative changes of the spine. Multilevel disc desiccation with vacuum phenomena. No acute fracture. IMPRESSION: Extensive sigmoid and descending colonic diverticulosis with muscular  hypertrophy and pericolonic scarring. Mild acute diverticulitis is not entirely excluded. Clinical correlation is recommended. No bowel obstruction. Normal appendix. Electronically Signed   By: Elgie Collard M.D.   On: 02/29/2016 04:16    Procedures Procedures (including critical care time)  Medications Ordered in ED Medications  iopamidol (ISOVUE-300) 61 % injection (100 mLs  Contrast Given 02/29/16 0339)  morphine 4 MG/ML injection 2 mg (2 mg Intravenous Given 02/29/16 0454)  ondansetron (ZOFRAN) injection 4 mg (4 mg Intravenous Given 02/29/16 0454)     Initial Impression / Assessment and Plan / ED Course  I have reviewed the triage vital signs and the nursing notes.  Pertinent labs & imaging results that were available during my care of the patient were reviewed by me and considered in my medical decision making (see chart for details).  Clinical Course   Patient presents to the ER for evaluation of left lower abdominal pain. Symptoms have been ongoing for nearly a week. Patient had a prescription for Cipro called in by her primary doctor, has been on it for 5 days without improvement. Urinalysis consistent with infection. Culture pending. Based on her examination, however, also considered diverticulitis. CT scan was not helpful. Patient has such extensive diverticular disease, cannot identify whether or not there is infection. Clinically, however, I am very concerned for diverticulitis. Patient will need treatment for UTI and diverticulitis. She also will require pain control. Will ask hospitalist to admit for antibiotic therapy and pain control.  Final Clinical Impressions(s) / ED Diagnoses   Final diagnoses:  Diverticulitis of large intestine without perforation or abscess without bleeding  Acute cystitis without hematuria    New Prescriptions New Prescriptions   No medications on file   I personally performed the services described in this documentation, which was scribed  in my presence. The recorded information has been reviewed and is accurate.    Gilda Crease, MD 02/29/16 [phone]

## 2016-02-29 NOTE — ED Notes (Signed)
Spoke with Dr. Julian Reil that states pt does not need telemetry monitoring at this time.

## 2016-02-29 NOTE — Progress Notes (Signed)
Interval Note  Patient seen and examined  82 year old female with medical history significant for CAD, diverticulosis, hypertension was admitted for UTI which failed outpatient treatment and possible diverticulitis. Patient was treated with Cipro for UTI 5 days PTA. Patient was started on IV antibiotics Bactrim plus Flagyl given history of anaphylaxis to penicillins.  Patient was seen and examined, abdominal pain has improved she denies bloody bowel movement or diarrhea. Patient afebrile Physical exam remarkable for left lower quadrant tenderness  A&P  UTI and possible diverticulitis Continue current antibiotic treatment Will IV fluids given soft BP Follow-up urine culture Spike fever or shows signs of sepsis get blood cultures and broaden antibiotics to aztreonam and vancomycin Monitor BMP in the morning Hold antihypertensives  For full details see H&P  Latrelle Dodrill, MD [facility]  Pager [phone]

## 2016-02-29 NOTE — H&P (Addendum)
History and Physical    Alexis Yates:[id number] DOB: Aug 15, 1927 DOA: 02/29/2016   PCP: Pearla Dubonnet, MD Chief Complaint:  Chief Complaint  Patient presents with  . Abdominal Pain    HPI: Alexis Yates is a 82 y.o. female with medical history significant of CAD, diverticulosis, HTN.  Patient presents to the ED with c/o constant, lower abdominal pain for the past 1-2 days.  Pain is pinching, burning, and cramping.  Patient actually got put on cipro for a UTI 5 days ago and this abdominal pain has developed in spite of this.  Nothing makes pain worse, gets better at times while sitting.  No fever, chills, N/V, SOB.  ED Course: Has UTI (in spite of cipro), Exam suspicious for diverticulitis, CT is equivocal for diverticulitis (has severe diverticulosis and diverticulitis may be present.  Review of Systems: As per HPI otherwise 10 point review of systems negative.    Past Medical History:  Diagnosis Date  . Arthritis   . Atherosclerosis of aorta (HCC)   . CAD (coronary artery disease)    a. 10/27 PCI with DES to RCA, diffuse nonobstructive disease, EF 50-55%  . Diverticulosis   . GERD (gastroesophageal reflux disease)   . Glaucoma   . Hiatal hernia   . Hypertension   . Lung nodules    Right  . Scoliosis     Past Surgical History:  Procedure Laterality Date  . ABDOMINAL HYSTERECTOMY    . CARDIAC CATHETERIZATION N/A 01/09/2016   Procedure: Left Heart Cath and Coronary Angiography;  Surgeon: Marykay Lex, MD;  Location: [facility] INVASIVE CV LAB;  Service: Cardiovascular;  Laterality: N/A;  . CARDIAC CATHETERIZATION N/A 01/09/2016   Procedure: Coronary Stent Intervention;  Surgeon: Marykay Lex, MD;  Location: [facility] INVASIVE CV LAB;  Service: Cardiovascular;  Laterality: N/A;  . EYE SURGERY    . FRACTURE SURGERY     Tibia/fibula of right leg     reports that she has quit smoking. She has never used smokeless tobacco. She reports that she does not drink alcohol or  use drugs.  Allergies  Allergen Reactions  . Penicillins Anaphylaxis  . Phenergan [Promethazine Hcl]     Restless legs    Family History  Problem Relation Age of Onset  . Cancer Brother     Stomach  . Heart disease Father     Died age 36  . Diabetes Mother   . Heart failure Mother     Died 70      Prior to Admission medications   Medication Sig Start Date End Date Taking? Authorizing Provider  ALPRAZolam Prudy Feeler) 0.5 MG tablet Take 0.25 mg by mouth 2 (two) times daily as needed for anxiety.     Historical Provider, MD  aspirin EC 81 MG EC tablet Take 1 tablet (81 mg total) by mouth daily. 01/14/16   Arty Baumgartner, NP  atorvastatin (LIPITOR) 40 MG tablet Take 1 tablet (40 mg total) by mouth daily at 6 PM. 01/13/16   Arty Baumgartner, NP  bimatoprost (LUMIGAN) 0.01 % SOLN Place 1 drop into the left eye at bedtime.     Historical Provider, MD  brimonidine (ALPHAGAN P) 0.1 % SOLN Place 1 drop into both eyes 2 (two) times daily.    Historical Provider, MD  calcium-vitamin D 250-100 MG-UNIT per tablet Take 1 tablet by mouth 2 (two) times daily.    Historical Provider, MD  celecoxib (CELEBREX) 200 MG capsule Take 200 mg by mouth 2 (  two) times daily.     Historical Provider, MD  citalopram (CELEXA) 20 MG tablet Take 20 mg by mouth every evening.     Historical Provider, MD  clopidogrel (PLAVIX) 75 MG tablet Take 1 tablet (75 mg total) by mouth daily. 01/14/16   Arty Baumgartner, NP  docusate sodium (COLACE) 100 MG capsule Take 100 mg by mouth 2 (two) times daily.     Historical Provider, MD  dorzolamide (TRUSOPT) 2 % ophthalmic solution Place 1 drop into both eyes 2 (two) times daily.    Historical Provider, MD  metoprolol tartrate (LOPRESSOR) 25 MG tablet Take 0.5 tablets (12.5 mg total) by mouth 2 (two) times daily. 01/13/16   Arty Baumgartner, NP  nitroGLYCERIN (NITROSTAT) 0.4 MG SL tablet Place 1 tablet (0.4 mg total) under the tongue every 5 (five) minutes as needed for chest  pain. 01/13/16   Arty Baumgartner, NP  ofloxacin (OCUFLOX) 0.3 % ophthalmic solution Place 1 drop into the left eye 4 (four) times daily. 01/08/15   Historical Provider, MD  oxyCODONE (OXY IR/ROXICODONE) 5 MG immediate release tablet Take 5 mg by mouth 3 (three) times daily as needed for severe pain or breakthrough pain (pain).     Historical Provider, MD  oxymorphone (OPANA ER) 10 MG T12A 12 hr tablet Take 10 mg by mouth every 12 (twelve) hours.    Historical Provider, MD  pantoprazole (PROTONIX) 40 MG tablet Take 1 tablet (40 mg total) by mouth 2 (two) times daily. 01/13/16   Arty Baumgartner, NP  Polyethyl Glycol-Propyl Glycol (SYSTANE OP) Place 1 drop into both eyes 2 (two) times daily.    Historical Provider, MD  prednisoLONE acetate (PRED FORTE) 1 % ophthalmic suspension Place 1 drop into the left eye 2 (two) times daily.  01/08/15   Historical Provider, MD  Vitamin D, Cholecalciferol, 1000 UNITS TABS Take 1,000 Units by mouth daily.    Historical Provider, MD    Physical Exam: Vitals:   02/29/16 0452 02/29/16 0453 02/29/16 0500 02/29/16 0530  BP: 108/63  104/57 (!) 99/52  Pulse:  80 80 79  Resp:  11 17 15   Temp:      TempSrc:      SpO2:  94% 92% 93%  Weight:          Constitutional: NAD, calm, comfortable Eyes: PERRL, lids and conjunctivae normal ENMT: Mucous membranes are moist. Posterior pharynx clear of any exudate or lesions.Normal dentition.  Neck: normal, supple, no masses, no thyromegaly Respiratory: clear to auscultation bilaterally, no wheezing, no crackles. Normal respiratory effort. No accessory muscle use.  Cardiovascular: Regular rate and rhythm, no murmurs / rubs / gallops. No extremity edema. 2+ pedal pulses. No carotid bruits.  Abdomen: LLQ tenderness with guarding no masses palpated. No hepatosplenomegaly. Bowel sounds positive.  Musculoskeletal: no clubbing / cyanosis. No joint deformity upper and lower extremities. Good ROM, no contractures. Normal muscle  tone.  Skin: no rashes, lesions, ulcers. No induration Neurologic: CN 2-12 grossly intact. Sensation intact, DTR normal. Strength 5/5 in all 4.  Psychiatric: Normal judgment and insight. Alert and oriented x 3. Normal mood.    Labs on Admission: I have personally reviewed following labs and imaging studies  CBC:  Recent Labs Lab 02/28/16 2124  WBC 11.1*  HGB 11.4*  HCT 35.3*  MCV 92.7  PLT 276   Basic Metabolic Panel:  Recent Labs Lab 02/28/16 2124  NA 137  K 4.3  CL 103  CO2 26  GLUCOSE 120*  BUN 19  CREATININE 0.89  CALCIUM 9.4   GFR: Estimated Creatinine Clearance: 32.6 mL/min (by C-G formula based on SCr of 0.89 mg/dL). Liver Function Tests:  Recent Labs Lab 02/28/16 2124  AST 57*  ALT 66*  ALKPHOS 76  BILITOT 0.4  PROT 6.6  ALBUMIN 3.6    Recent Labs Lab 02/28/16 2124  LIPASE 33   No results for input(s): AMMONIA in the last 168 hours. Coagulation Profile: No results for input(s): INR, PROTIME in the last 168 hours. Cardiac Enzymes: No results for input(s): CKTOTAL, CKMB, CKMBINDEX, TROPONINI in the last 168 hours. BNP (last 3 results) No results for input(s): PROBNP in the last 8760 hours. HbA1C: No results for input(s): HGBA1C in the last 72 hours. CBG: No results for input(s): GLUCAP in the last 168 hours. Lipid Profile: No results for input(s): CHOL, HDL, LDLCALC, TRIG, CHOLHDL, LDLDIRECT in the last 72 hours. Thyroid Function Tests: No results for input(s): TSH, T4TOTAL, FREET4, T3FREE, THYROIDAB in the last 72 hours. Anemia Panel: No results for input(s): VITAMINB12, FOLATE, FERRITIN, TIBC, IRON, RETICCTPCT in the last 72 hours. Urine analysis:    Component Value Date/Time   COLORURINE YELLOW 02/28/2016 2125   APPEARANCEUR CLEAR 02/28/2016 2125   LABSPEC 1.015 02/28/2016 2125   PHURINE 5.5 02/28/2016 2125   GLUCOSEU NEGATIVE 02/28/2016 2125   HGBUR NEGATIVE 02/28/2016 2125   BILIRUBINUR NEGATIVE 02/28/2016 2125   KETONESUR  NEGATIVE 02/28/2016 2125   PROTEINUR NEGATIVE 02/28/2016 2125   UROBILINOGEN 0.2 05/27/2014 1122   NITRITE NEGATIVE 02/28/2016 2125   LEUKOCYTESUR MODERATE (A) 02/28/2016 2125   Sepsis Labs: @LABRCNTIP (procalcitonin:4,lacticidven:4) )No results found for this or any previous visit (from the past 240 hour(s)).   Radiological Exams on Admission: Ct Abdomen Pelvis W Contrast  Result Date: 02/29/2016 CLINICAL DATA:  82 year old female with left lower quadrant abdominal pain. EXAM: CT ABDOMEN AND PELVIS WITH CONTRAST TECHNIQUE: Multidetector CT imaging of the abdomen and pelvis was performed using the standard protocol following bolus administration of intravenous contrast. CONTRAST:  100mL ISOVUE-300 IOPAMIDOL (ISOVUE-300) INJECTION 61% COMPARISON:  None. FINDINGS: Lower chest: The visualized lung bases are clear. There is coronary vascular calcification. No intra-abdominal free air or free fluid Hepatobiliary: No focal liver abnormality is seen. No gallstones, gallbladder wall thickening, or biliary dilatation. Pancreas: Unremarkable. No pancreatic ductal dilatation or surrounding inflammatory changes. Spleen: Normal in size without focal abnormality. Adrenals/Urinary Tract: The adrenal glands, kidneys, visualized ureters, and urinary bladder appear unremarkable. Stomach/Bowel: There is extensive sigmoid and descending colon diverticulosis with muscular hypertrophy. Mild pericolonic haziness along the descending colon may be related to chronic inflammation/ scarring, mild acute diverticulitis is not entirely excluded. Clinical correlation is recommended. There is no evidence of diverticular perforation or abscess. Moderate stool noted in the proximal colon. No evidence of bowel obstruction. Normal appendix. Vascular/Lymphatic: There is advanced aortoiliac atherosclerotic disease. No aneurysmal dilatation or evidence of dissection. The origins of the celiac axis is patent. There is chronic appearing  thrombus or noncalcified plaque at the origin of the SMA with focal narrowing of the vessel. The SMA however remains patent. The IMA is patent as well. The SMV, splenic vein, and main portal veins are patent. No portal venous gas identified. There is no adenopathy. Reproductive: Hysterectomy.  A pessary is noted within the pelvis. Other: None Musculoskeletal: Osteopenia with scoliosis and degenerative changes of the spine. Multilevel disc desiccation with vacuum phenomena. No acute fracture. IMPRESSION: Extensive sigmoid and descending colonic diverticulosis with muscular hypertrophy and pericolonic scarring. Mild  acute diverticulitis is not entirely excluded. Clinical correlation is recommended. No bowel obstruction. Normal appendix. Electronically Signed   By: Elgie Collard M.D.   On: 02/29/2016 04:16    EKG: Independently reviewed.  Assessment/Plan Active Problems:   Urinary tract infection without hematuria   Diverticulitis    1. UTI and uncomplicated diverticulitis - 1. Pain control: will increase frequency of oxy IR and continue long acting narcotic that she is on at home 2. Antibiotics: 1. Patient has anaphylaxis to PCN (recalls the hospital stay, etc) 2. Unknown if she has ever taken cephalosporins (and I dont want to risk it) 3. Has failed cipro outpatient 4. Therefore I curbsided Dr. Luciana Axe who recommended trying bactrim + flagyl for now 5. If she gets worse (becomes septic) then would proceed to aztreonam / vanc / flagyl 3. Urine Cx pending 4. Repeat BMP tomorrow AM 2. HTN - will hold home metoprolol due to BPs of 100 systolic in ED.  Patient has 0 SIRS criteria, and does not particularly appear toxic or ill in the ED.  Doubt that this is due to acute sepsis.   DVT prophylaxis: Lovenox Code Status: Full Family Communication: Family at bedside Consults called: Phone called Dr. Luciana Axe who recommended bactrim + flagyl Admission status: Admit to inpatient   Hillary Bow  DO [facility] Pager [phone] from 7PM-7AM  If 7AM-7PM, please contact the day physician for the patient [email or web address] Password [email or web address]  02/29/2016, 6:09 AM

## 2016-03-01 ENCOUNTER — Encounter (HOSPITAL_COMMUNITY): Payer: Self-pay

## 2016-03-01 LAB — URINE CULTURE

## 2016-03-01 LAB — BASIC METABOLIC PANEL
Anion gap: 7 (ref 5–15)
BUN: 10 mg/dL (ref 6–20)
CALCIUM: 8.9 mg/dL (ref 8.9–10.3)
CHLORIDE: 102 mmol/L (ref 101–111)
CO2: 27 mmol/L (ref 22–32)
CREATININE: 0.94 mg/dL (ref 0.44–1.00)
GFR calc Af Amer: >60 mL/min (ref >60)
GFR calc non Af Amer: 53 mL/min — ABNORMAL LOW (ref >60)
Glucose, Bld: 87 mg/dL (ref 65–99)
Potassium: 4 mmol/L (ref 3.5–5.1)
Sodium: 136 mmol/L (ref 135–145)

## 2016-03-01 MED ORDER — METRONIDAZOLE IN NACL 5-0.79 MG/ML-% IV SOLN
500.0000 mg | Freq: Three times a day (TID) | INTRAVENOUS | Status: DC
  Administered 2016-03-01 – 2016-03-03 (×6): 500 mg via INTRAVENOUS
  Filled 2016-03-01 (×6): qty 100

## 2016-03-01 MED ORDER — FAMOTIDINE 20 MG PO TABS
10.0000 mg | ORAL_TABLET | Freq: Every day | ORAL | Status: DC
  Administered 2016-03-01 – 2016-03-03 (×3): 10 mg via ORAL
  Filled 2016-03-01 (×3): qty 1

## 2016-03-01 MED ORDER — SODIUM CHLORIDE 0.9 % IV BOLUS (SEPSIS)
250.0000 mL | Freq: Once | INTRAVENOUS | Status: AC
  Administered 2016-03-01: 250 mL via INTRAVENOUS

## 2016-03-01 MED ORDER — SODIUM CHLORIDE 0.9 % IV SOLN
INTRAVENOUS | Status: DC
  Administered 2016-03-01: 1000 mL via INTRAVENOUS
  Administered 2016-03-01: 12:00:00 via INTRAVENOUS
  Administered 2016-03-02: 1000 mL via INTRAVENOUS
  Administered 2016-03-02: 10:00:00 via INTRAVENOUS
  Administered 2016-03-02: 1000 mL via INTRAVENOUS

## 2016-03-01 MED ORDER — ONDANSETRON HCL 4 MG/2ML IJ SOLN
4.0000 mg | Freq: Four times a day (QID) | INTRAMUSCULAR | Status: DC | PRN
  Administered 2016-03-01 – 2016-03-03 (×2): 4 mg via INTRAVENOUS
  Filled 2016-03-01 (×2): qty 2

## 2016-03-01 MED ORDER — ALUM & MAG HYDROXIDE-SIMETH 200-200-20 MG/5ML PO SUSP
15.0000 mL | ORAL | Status: DC | PRN
  Administered 2016-03-01: 15 mL via ORAL
  Filled 2016-03-01: qty 30

## 2016-03-01 MED ORDER — SULFAMETHOXAZOLE-TRIMETHOPRIM 400-80 MG PO TABS
1.0000 | ORAL_TABLET | Freq: Two times a day (BID) | ORAL | Status: DC
  Administered 2016-03-02 (×2): 1 via ORAL
  Filled 2016-03-01 (×6): qty 1

## 2016-03-01 NOTE — Care Management Note (Addendum)
Case Management Note  Patient Details  Name: Alexis Yates MRN: [id number] Date of Birth: February 05, 1928  Subjective/Objective:                 Patient from home alone. Has housekeeper who cleans and does laundry every week. She is active in outpatient PT and has a walker at home. PT eval pending. Patient would like to continue her OP PT after discharge.   Action/Plan:  CM will continue to monitor. 12/19 Anticipate DC to home, self care, continuing OP PT as established prior to admission.   Expected Discharge Date:  03/02/16               Expected Discharge Plan:  Home w Home Health Services  In-House Referral:     Discharge planning Services  CM Consult  Post Acute Care Choice:    Choice offered to:     DME Arranged:    DME Agency:     HH Arranged:    HH Agency:     Status of Service:  In process, will continue to follow  If discussed at Long Length of Stay Meetings, dates discussed:    Additional Comments:  Lawerance Sabal, RN 03/01/2016, 4:18 PM

## 2016-03-01 NOTE — Progress Notes (Signed)
PROGRESS NOTE    Alexis Yates  RUE:[id number]  DOB: 08/20/1927  DOA: 02/29/2016 PCP: Pearla Dubonnet, MD   Hospital course: 82 year old female with medical history significant for CAD, diverticulosis, hypertension was admitted for UTI which failed outpatient treatment and possible diverticulitis. Patient was treated with Cipro for UTI 5 days PTA. Patient was started on IV antibiotics Bactrim plus Flagyl given history of anaphylaxis to penicillins.  Assessment & Plan:   UTI - continue current treatment and symptomatic care.    Mild Diverticulitis - improving slowly with current care but not eating very well.  Encouraged small amount of eating as tolerated.    Nausea and vomiting - likely secondary to oral flagyl, will try IV flagyl, add anti-nausea medications and follow.    Essential Hypertension - Pt having soft blood pressures now, will hold BP meds and be a little bit more aggressive with IVFs, give small bolus IVF and increase rate to 100 cc/hour.   Mild dehydration - treating with IVF as above and follow clinically.    Subjective: Pt says that she feels nauseated after taking the oral antibiotics.    Objective: Vitals:   02/29/16 0600 02/29/16 0659 02/29/16 1308 03/01/16 0558  BP:   (!) 93/55 (!) 91/51  Pulse: 76  75 78  Resp: 16  18 16   Temp:   97.5 F (36.4 C) 98.6 F (37 C)  TempSrc:   Oral Oral  SpO2: 95%  93% 93%  Weight:  50.3 kg (111 lb)    Height:  5' (1.524 m)      Intake/Output Summary (Last 24 hours) at 03/01/16 1131 Last data filed at 03/01/16 1009  Gross per 24 hour  Intake             1605 ml  Output             1100 ml  Net              505 ml   Filed Weights   02/28/16 2122 02/29/16 0659  Weight: 50.3 kg (111 lb) 50.3 kg (111 lb)    Exam:   Constitutional: NAD, calm, comfortable Eyes: PERRL, lids and conjunctivae normal ENMT: Mucous membranes are moist. Posterior pharynx clear of any exudate or lesions. Neck: normal, supple,  no masses, no thyromegaly Respiratory: clear to auscultation bilaterally, no wheezing, no crackles. Normal respiratory effort. No accessory muscle use.  Cardiovascular: Regular rate and rhythm, no murmurs / rubs / gallops. No extremity edema. 2+ pedal pulses. No carotid bruits.  Abdomen: LLQ tenderness but no masses palpated. No hepatosplenomegaly. Bowel sounds positive.  Musculoskeletal: no clubbing / cyanosis. No joint deformity upper and lower extremities. Good ROM, no contractures. Normal muscle tone.  Skin: no rashes, lesions, ulcers. No induration Neurologic: CN 2-12 grossly intact. Sensation intact, DTR normal. Strength 5/5 in all 4.  Psychiatric: Normal judgment and insight. Alert and oriented x 3. Normal mood.   Data Reviewed: Basic Metabolic Panel:  Recent Labs Lab 02/28/16 2124 03/01/16 0343  NA 137 136  K 4.3 4.0  CL 103 102  CO2 26 27  GLUCOSE 120* 87  BUN 19 10  CREATININE 0.89 0.94  CALCIUM 9.4 8.9   Liver Function Tests:  Recent Labs Lab 02/28/16 2124  AST 57*  ALT 66*  ALKPHOS 76  BILITOT 0.4  PROT 6.6  ALBUMIN 3.6    Recent Labs Lab 02/28/16 2124  LIPASE 33   No results for input(s): AMMONIA in the last 168 hours.  CBC:  Recent Labs Lab 02/28/16 2124  WBC 11.1*  HGB 11.4*  HCT 35.3*  MCV 92.7  PLT 276   Cardiac Enzymes: No results for input(s): CKTOTAL, CKMB, CKMBINDEX, TROPONINI in the last 168 hours. CBG (last 3)  No results for input(s): GLUCAP in the last 72 hours. Recent Results (from the past 240 hour(s))  Urine culture     Status: Abnormal   Collection Time: 02/28/16  9:25 PM  Result Value Ref Range Status   Specimen Description URINE, RANDOM  Final   Special Requests NONE  Final   Culture <10,000 COLONIES/mL INSIGNIFICANT GROWTH (A)  Final   Report Status 03/01/2016 FINAL  Final     Studies: Ct Abdomen Pelvis W Contrast  Result Date: 02/29/2016 CLINICAL DATA:  82 year old female with left lower quadrant abdominal pain.  EXAM: CT ABDOMEN AND PELVIS WITH CONTRAST TECHNIQUE: Multidetector CT imaging of the abdomen and pelvis was performed using the standard protocol following bolus administration of intravenous contrast. CONTRAST:  ISOVUE-300 IOPAMIDOL (ISOVUE-300) INJECTION 61% COMPARISON:  None. FINDINGS: Lower chest: The visualized lung bases are clear. There is coronary vascular calcification. No intra-abdominal free air or free fluid Hepatobiliary: No focal liver abnormality is seen. No gallstones, gallbladder wall thickening, or biliary dilatation. Pancreas: Unremarkable. No pancreatic ductal dilatation or surrounding inflammatory changes. Spleen: Normal in size without focal abnormality. Adrenals/Urinary Tract: The adrenal glands, kidneys, visualized ureters, and urinary bladder appear unremarkable. Stomach/Bowel: There is extensive sigmoid and descending colon diverticulosis with muscular hypertrophy. Mild pericolonic haziness along the descending colon may be related to chronic inflammation/ scarring, mild acute diverticulitis is not entirely excluded. Clinical correlation is recommended. There is no evidence of diverticular perforation or abscess. Moderate stool noted in the proximal colon. No evidence of bowel obstruction. Normal appendix. Vascular/Lymphatic: There is advanced aortoiliac atherosclerotic disease. No aneurysmal dilatation or evidence of dissection. The origins of the celiac axis is patent. There is chronic appearing thrombus or noncalcified plaque at the origin of the SMA with focal narrowing of the vessel. The SMA however remains patent. The IMA is patent as well. The SMV, splenic vein, and main portal veins are patent. No portal venous gas identified. There is no adenopathy. Reproductive: Hysterectomy.  A pessary is noted within the pelvis. Other: None Musculoskeletal: Osteopenia with scoliosis and degenerative changes of the spine. Multilevel disc desiccation with vacuum phenomena. No acute fracture.  IMPRESSION: Extensive sigmoid and descending colonic diverticulosis with muscular hypertrophy and pericolonic scarring. Mild acute diverticulitis is not entirely excluded. Clinical correlation is recommended. No bowel obstruction. Normal appendix. Electronically Signed   By: Elgie Collard M.D.   On: 02/29/2016 04:16     Scheduled Meds: . aspirin EC  81 mg Oral Daily  . atorvastatin  40 mg Oral q1800  . celecoxib  200 mg Oral QHS  . citalopram  20 mg Oral QPM  . clopidogrel  75 mg Oral Daily  . docusate sodium  100 mg Oral BID  . dorzolamide  1 drop Both Eyes BID  . enoxaparin (LOVENOX) injection  30 mg Subcutaneous Q24H  . famotidine  10 mg Oral QHS  . latanoprost  1 drop Left Eye QHS  . metronidazole  500 mg Intravenous Q8H  . morphine  30 mg Oral Q12H  . pantoprazole  40 mg Oral BID  . prednisoLONE acetate  1 drop Right Eye BID  . senna  2 tablet Oral QHS  . sulfamethoxazole-trimethoprim  1 tablet Oral Q12H   Continuous Infusions: .  sodium chloride      Active Problems:   Urinary tract infection without hematuria   Diverticulitis   Time spent:   Standley Dakinslanford Johnson, MD, FAAFP [facility] Pager [phone]  If 7PM-7AM, please contact night-coverage [email or web address] Password [email or web address] 03/01/2016, 11:31 AM    LOS: 1 day

## 2016-03-02 ENCOUNTER — Ambulatory Visit: Payer: Self-pay | Admitting: Cardiology

## 2016-03-02 DIAGNOSIS — W19XXXD Unspecified fall, subsequent encounter: Secondary | ICD-10-CM

## 2016-03-02 MED ORDER — ENSURE ENLIVE PO LIQD: 237.0000 mL | Freq: Two times a day (BID) | ORAL | Status: DC

## 2016-03-02 NOTE — Progress Notes (Signed)
PROGRESS NOTE    Alexis Yates  XLK:[id number]  DOB: April 07, 1927  DOA: 02/29/2016 PCP: Pearla Dubonnet, MD   Hospital course: 82 year old female with medical history significant for CAD, diverticulosis, hypertension was admitted for UTI which failed outpatient treatment and possible diverticulitis. Patient was treated with Cipro for UTI 5 days PTA. Patient was started on IV antibiotics Bactrim plus Flagyl given history of anaphylaxis to penicillins.  Assessment & Plan:   UTI - urine culture insignificant growth.    Mild Diverticulitis - improving but not eating very well.  Encouraged small amount of eating as tolerated. No more oral flagyl, plan to switch to oral cipro 12/20  Nausea and vomiting - much improved with IV flagyl, add anti-nausea medications and follow.  Plan to switch to oral antibiotic 12/20  Essential Hypertension - soft BPs much improved after hydration, still holding bp meds.  Mild dehydration - treating with IVF, cutting down the rate as she is eating and drinking a bit more, hopefully can stop tomorrow.    Falls - PT/OT eval pending.   Subjective: Pt says that her abdomen feels better and she tolerated full liquid diet.    Objective: Vitals:   03/01/16 1546 03/01/16 1730 03/01/16 2223 03/02/16 0624  BP: 92/62 (!) 107/55 114/73 (!) 103/59  Pulse: 81 80 85 81  Resp:   17 17  Temp: 98.3 F (36.8 C)  98.3 F (36.8 C) 98.1 F (36.7 C)  TempSrc: Oral  Oral Oral  SpO2: 92%  99% 95%  Weight:      Height:        Intake/Output Summary (Last 24 hours) at 03/02/16 1055 Last data filed at 03/02/16 0700  Gross per 24 hour  Intake            612.5 ml  Output             1100 ml  Net           -487.5 ml   Filed Weights   02/28/16 2122 02/29/16 0659  Weight: 50.3 kg (111 lb) 50.3 kg (111 lb)    Exam:  Constitutional: NAD, calm, comfortable Eyes: PERRL, lids and conjunctivae normal ENMT: Mucous membranes are moist. Posterior pharynx clear of any  exudate or lesions. Neck: normal, supple, no masses, no thyromegaly Respiratory: clear to auscultation bilaterally, no wheezing, no crackles. Normal respiratory effort. No accessory muscle use.  Cardiovascular: Regular rate and rhythm, no murmurs / rubs / gallops. No extremity edema. 2+ pedal pulses. No carotid bruits.  Abdomen: no tenderness in LLQ today and no masses palpated. No hepatosplenomegaly. Bowel sounds positive.  Musculoskeletal: no clubbing / cyanosis. No joint deformity upper and lower extremities. Good ROM, no contractures. Normal muscle tone. Bandage left elbow from skin tear. Skin: no rashes, lesions, ulcers. No induration Neurologic: CN 2-12 grossly intact. Sensation intact, DTR normal. Strength 5/5 in all 4.  Psychiatric: Normal judgment and insight. Alert and oriented x 3. Normal mood.   Data Reviewed: Basic Metabolic Panel:  Recent Labs Lab 02/28/16 2124 03/01/16 0343  NA 137 136  K 4.3 4.0  CL 103 102  CO2 26 27  GLUCOSE 120* 87  BUN 19 10  CREATININE 0.89 0.94  CALCIUM 9.4 8.9   Liver Function Tests:  Recent Labs Lab 02/28/16 2124  AST 57*  ALT 66*  ALKPHOS 76  BILITOT 0.4  PROT 6.6  ALBUMIN 3.6    Recent Labs Lab 02/28/16 2124  LIPASE 33   No results for  input(s): AMMONIA in the last 168 hours. CBC:  Recent Labs Lab 02/28/16 2124  WBC 11.1*  HGB 11.4*  HCT 35.3*  MCV 92.7  PLT 276   Cardiac Enzymes: No results for input(s): CKTOTAL, CKMB, CKMBINDEX, TROPONINI in the last 168 hours. CBG (last 3)  No results for input(s): GLUCAP in the last 72 hours. Recent Results (from the past 240 hour(s))  Urine culture     Status: Abnormal   Collection Time: 02/28/16  9:25 PM  Result Value Ref Range Status   Specimen Description URINE, RANDOM  Final   Special Requests NONE  Final   Culture <10,000 COLONIES/mL INSIGNIFICANT GROWTH (A)  Final   Report Status 03/01/2016 FINAL  Final     Studies: No results found.   Scheduled Meds: .  aspirin EC  81 mg Oral Daily  . atorvastatin  40 mg Oral q1800  . celecoxib  200 mg Oral QHS  . citalopram  20 mg Oral QPM  . clopidogrel  75 mg Oral Daily  . docusate sodium  100 mg Oral BID  . dorzolamide  1 drop Both Eyes BID  . enoxaparin (LOVENOX) injection  30 mg Subcutaneous Q24H  . famotidine  10 mg Oral QHS  . latanoprost  1 drop Left Eye QHS  . metronidazole  500 mg Intravenous Q8H  . morphine  30 mg Oral Q12H  . pantoprazole  40 mg Oral BID  . prednisoLONE acetate  1 drop Right Eye BID  . senna  2 tablet Oral QHS  . sulfamethoxazole-trimethoprim  1 tablet Oral Q12H   Continuous Infusions: . sodium chloride 100 mL/hr at 03/02/16 0930    Active Problems:   Urinary tract infection without hematuria   Diverticulitis  Time spent:   Standley Dakinslanford Johnson, MD, FAAFP [facility] Pager [phone]  If 7PM-7AM, please contact night-coverage [email or web address] Password [email or web address] 03/02/2016, 10:55 AM    LOS: 2 days

## 2016-03-02 NOTE — Progress Notes (Addendum)
Initial Nutrition Assessment  DOCUMENTATION CODES:   Not applicable  INTERVENTION:   -Ensure Enlive po BID, each supplement provides 350 kcal and 20 grams of protein -Provided education on low fiber diet  NUTRITION DIAGNOSIS:   Inadequate oral intake related to altered GI function as evidenced by meal completion < 50%.  GOAL:   Patient will meet greater than or equal to 90% of their needs  MONITOR:   PO intake, Supplement acceptance, Diet advancement, Labs, Weight trends, Skin, I & O's  REASON FOR ASSESSMENT:   Consult Assessment of nutrition requirement/status  ASSESSMENT:   Alexis Yates is a 82 y.o. female with medical history significant of CAD, diverticulosis, HTN.  Patient presents to the ED with c/o constant, lower abdominal pain for the past 1-2 days.  Pain is pinching, burning, and cramping.  Patient actually got put on cipro for a UTI 5 days ago and this abdominal pain has developed in spite of this.  Nothing makes pain worse, gets better at times while sitting.  No fever, chills, N/V, SOB.  Pt admitted with UTI and diverticulitis.   Spoke with pt at bedside, who reports fair appetite at baseline. She shares that she was eating poorly prior to today, related to limited choices on clear liquid diet. Pt reports she consumed her grits and milk on her breakfast tray without difficulty. She feels like she will eat more due to increased food selections. Per RN, plan to advance to soft diet as tolerated.   Pt denies weight loss, reporting UBW around 111#.   Per pt, she typically consumes a high fiber diet at home. Discussed medical nutrition therapy for diverticulitis. RD provided "Fiber Restricted Nutrition Therapy" handout from the [employer]. Reviewed patient's dietary recall and discussed ways for pt to meet nutrition goals over the next several weeks. Explained reasons for pt to follow a low fiber diet over the next 4-6 weeks. Reviewed low fiber  foods and high fiber foods. Discussed best practice for long term management of diverticulosis is a high fiber diet and discussed ways to gradually increase fiber in the diet. Teach back method used. Pt verbalizes understanding of information provided. Expect good compliance.  Nutrition-Focused physical exam completed. Findings are no fat depletion, no muscle depletion, and no edema.   Case discussed with RN.  Labs reviewed.   Diet Order:  DIET SOFT Room service appropriate? Yes; Fluid consistency: Thin  Skin:  Reviewed, no issues  Last BM:  03/02/16  Height:   Ht Readings from Last 1 Encounters:  02/29/16 5' (1.524 m)    Weight:   Wt Readings from Last 1 Encounters:  02/29/16 111 lb (50.3 kg)    Ideal Body Weight:  45.5 kg  BMI:  Body mass index is 21.68 kg/m.  Estimated Nutritional Needs:   Kcal:  1250-1450  Protein:  60-75 grams  Fluid:  1.2-1.4 L  EDUCATION NEEDS:   Education needs addressed  Joane Postel A. Mayford Knife, RD, LDN, CDE Pager: [phone] After hours Pager: [phone]

## 2016-03-03 MED ORDER — SODIUM CHLORIDE 0.9% FLUSH: 3.0000 mL | INTRAVENOUS | Status: DC | PRN

## 2016-03-03 MED ORDER — SODIUM CHLORIDE 0.9 % IV SOLN: 250.0000 mL | INTRAVENOUS | Status: DC | PRN

## 2016-03-03 MED ORDER — SODIUM CHLORIDE 0.9% FLUSH
3.0000 mL | Freq: Two times a day (BID) | INTRAVENOUS | Status: DC
  Administered 2016-03-03 – 2016-03-04 (×2): 3 mL via INTRAVENOUS

## 2016-03-03 MED ORDER — CIPROFLOXACIN HCL 500 MG PO TABS
500.0000 mg | ORAL_TABLET | Freq: Two times a day (BID) | ORAL | Status: DC
  Administered 2016-03-03 – 2016-03-04 (×3): 500 mg via ORAL
  Filled 2016-03-03 (×3): qty 1

## 2016-03-03 NOTE — Evaluation (Signed)
Occupational Therapy Evaluation Patient Details Name: Alexis FateMildred A Yates MRN: [id number] DOB: 1927/10/19 Today's Date: 03/03/2016    History of Present Illness 82 y.o. female with medical history significant of CAD, diverticulosis, HTN.  Patient presents to the ED with c/o constant, lower abdominal pain for the past 1-2 days. Pt on antibiotics for UTI 5 days ago.   Clinical Impression   Pt reports she was independent with ADL PTA. Currently pt overall min guard for ADL and functional mobility pt noted unsteadiness and posterior lean in sitting and standing. Pt presenting with generalized weakness, deconditioning, poor sitting/standing balance, and hx of falls impacting her independence and safety with ADL and functional mobility. Pt reports she lives at home alone but can arrange 24/7 supervision for the next few days starting tomorrow morning; would highly recommend 24/7 supervision at this time due to hx of falls PTA and current balance issues. Recommending HHOT for follow up, however, pt prefers to return to outpatient PT and does have transportation if necessary. Pt would benefit from continued skilled OT to address established goals.    Follow Up Recommendations  Home health OT;Supervision/Assistance - 24 hour (pt prefers to return to outpatient PT instead of HH services)    Equipment Recommendations  None recommended by OT    Recommendations for Other Services PT consult     Precautions / Restrictions Precautions Precautions: Fall Restrictions Weight Bearing Restrictions: No      Mobility Bed Mobility Overal bed mobility: Modified Independent             General bed mobility comments: Increased time requied. HOB flat without use of bed rails.  Transfers Overall transfer level: Needs assistance Equipment used: Straight cane Transfers: Sit to/from Stand Sit to Stand: Min guard         General transfer comment: Min guard for safety. Posterior lean in sitting and  standing, no LOB but pt unsteady on feet.    Balance Overall balance assessment: Needs assistance;History of Falls Sitting-balance support: Feet supported;No upper extremity supported Sitting balance-Leahy Scale: Fair Sitting balance - Comments: Posterior lean sitting EOB when unsupported Postural control: Posterior lean Standing balance support: Single extremity supported Standing balance-Leahy Scale: Fair Standing balance comment: Posterior lean in standing. No LOB with functional mobility but pt unsteady with small shuffling steps.                            ADL Overall ADL's : Needs assistance/impaired Eating/Feeding: Set up;Sitting   Grooming: Min guard;Standing   Upper Body Bathing: Supervision/ safety;Sitting   Lower Body Bathing: Min guard;Sit to/from stand   Upper Body Dressing : Supervision/safety;Sitting   Lower Body Dressing: Min guard;Sit to/from stand Lower Body Dressing Details (indicate cue type and reason): pt able to adjust socks sitting EOB Toilet Transfer: Min guard;Ambulation;Regular Toilet (cane) Toilet Transfer Details (indicate cue type and reason): Simulated by sit to stand from EOB with functional mobility in room       Tub/Shower Transfer Details (indicate cue type and reason): Uses tub bench for transfers Functional mobility during ADLs: Min guard;Cane General ADL Comments: Educated pt on recommendation for 24/7 supervision upon return home initially. Pt wishes to return to outpatient PT upon d/c instead of HH; pt reports housekeeper can assist with taking her to appointments.     Vision     Perception     Praxis      Pertinent Vitals/Pain Pain Assessment: Faces Faces Pain Scale:  Hurts little more Pain Location: abdomen Pain Descriptors / Indicators: Sore Pain Intervention(s): Monitored during session     Hand Dominance     Extremity/Trunk Assessment Upper Extremity Assessment Upper Extremity Assessment: Overall WFL for  tasks assessed   Lower Extremity Assessment Lower Extremity Assessment: Defer to PT evaluation   Cervical / Trunk Assessment Cervical / Trunk Assessment: Kyphotic   Communication Communication Communication: No difficulties   Cognition Arousal/Alertness: Awake/alert Behavior During Therapy: WFL for tasks assessed/performed Overall Cognitive Status: Within Functional Limits for tasks assessed                     General Comments       Exercises       Shoulder Instructions      Home Living Family/patient expects to be discharged to:: Private residence Living Arrangements: Alone Available Help at Discharge: Family;Available PRN/intermittently ("housekeeper") Type of Home: House Home Access: Ramped entrance     Home Layout: One level     Bathroom Shower/Tub: Tub/shower unit Shower/tub characteristics: Curtain Bathroom Toilet: Handicapped height     Home Equipment: Environmental consultant - 4 wheels;Cane - single point;Bedside commode;Hand held shower head;Grab bars - tub/shower;Grab bars - toilet;Tub bench          Prior Functioning/Environment Level of Independence: Independent with assistive device(s)        Comments: Cane for mobility. Housekeeper does grocery shopping, laundry, cleans. Pt does still drive.        OT Problem List: Decreased strength;Decreased activity tolerance;Impaired balance (sitting and/or standing);Pain   OT Treatment/Interventions: Self-care/ADL training;DME and/or AE instruction;Therapeutic activities;Patient/family education;Balance training    OT Goals(Current goals can be found in the care plan section) Acute Rehab OT Goals Patient Stated Goal: return home OT Goal Formulation: With patient Time For Goal Achievement: 03/17/16 Potential to Achieve Goals: Good ADL Goals Pt Will Perform Grooming: with modified independence;standing Pt Will Perform Upper Body Bathing: with modified independence;sitting Pt Will Perform Lower Body Bathing:  with modified independence;sit to/from stand Pt Will Transfer to Toilet: with modified independence;ambulating;bedside commode Pt Will Perform Toileting - Clothing Manipulation and hygiene: with modified independence;sit to/from stand Pt Will Perform Tub/Shower Transfer: Tub transfer;tub bench;with supervision  OT Frequency: Min 2X/week   Barriers to D/C: Decreased caregiver support  pt lives alone       Co-evaluation              End of Session Equipment Utilized During Treatment: Gait belt;Other (comment) (cane) Nurse Communication: Mobility status  Activity Tolerance: Patient tolerated treatment well Patient left: in bed;with call bell/phone within reach;with bed alarm set   Time: 1422-1440 OT Time Calculation (min): 18 min Charges:  OT General Charges $OT Visit: 1 Procedure OT Evaluation $OT Eval Moderate Complexity: 1 Procedure G-Codes:     Gaye Alken M.S., OTR/L Pager: [phone]  03/03/2016, 2:50 PM

## 2016-03-03 NOTE — Progress Notes (Signed)
PROGRESS NOTE    Alexis Yates  TEL:[id number]  DOB: 1927/04/04  DOA: 02/29/2016 PCP: Pearla Dubonnet, MD   Hospital course: 82 year old female with medical history significant for CAD, diverticulosis, hypertension was admitted for UTI which failed outpatient treatment and possible diverticulitis. Patient was treated with Cipro for UTI 5 days PTA. Patient was started on IV antibiotics Bactrim plus Flagyl given history of anaphylaxis to penicillins.  Assessment & Plan:   UTI - urine culture insignificant growth.    Mild Diverticulitis - improving and eating a little better today.  Encouraged small amount of eating as tolerated. No more oral flagyl, plan to switch to oral cipro 12/20  Nausea and vomiting - much improved with IV flagyl, add anti-nausea medications and follow.  Plan to switch to oral antibiotic 12/20  Essential Hypertension - soft BPs much improved after hydration, still holding bp meds.  Mild dehydration - treating with IVF, cutting down the rate as she is eating and drinking a bit more, hopefully can stop tomorrow.    Falls and weakness - PT/OT eval pending.   Subjective: Pt says that her pain is present but overall improving and ate some lunch, still with nausea.   Objective: Vitals:   03/02/16 1540 03/03/16 0020 03/03/16 0526 03/03/16 1351  BP: (!) 86/51 101/64 131/76 (!) 102/53  Pulse: (!) 114 78 79 81  Resp: 18 18 18 16   Temp:  98.2 F (36.8 C) 97.7 F (36.5 C) 97.7 F (36.5 C)  TempSrc:  Oral Oral Oral  SpO2: 100% 94% 95% 95%  Weight:      Height:        Intake/Output Summary (Last 24 hours) at 03/03/16 1440 Last data filed at 03/03/16 0631  Gross per 24 hour  Intake            487.5 ml  Output             1025 ml  Net           -537.5 ml   Filed Weights   02/28/16 2122 02/29/16 0659  Weight: 50.3 kg (111 lb) 50.3 kg (111 lb)    Exam:  Constitutional: NAD, calm, comfortable Eyes: PERRL, lids and conjunctivae normal ENMT: Mucous  membranes are moist. Posterior pharynx clear of any exudate or lesions. Neck: normal, supple, no masses, no thyromegaly Respiratory: clear to auscultation bilaterally, no wheezing, no crackles. Normal respiratory effort. No accessory muscle use.  Cardiovascular: Regular rate and rhythm, no murmurs / rubs / gallops. No extremity edema. 2+ pedal pulses. No carotid bruits.  Abdomen: no tenderness in LLQ today and no masses palpated. No hepatosplenomegaly. Bowel sounds positive.  Musculoskeletal: no clubbing / cyanosis. No joint deformity upper and lower extremities. Good ROM, no contractures. Normal muscle tone. Bandage left elbow from skin tear. Skin: no rashes, lesions, ulcers. No induration Neurologic: CN 2-12 grossly intact. Sensation intact, DTR normal. Strength 5/5 in all 4.  Psychiatric: Normal judgment and insight. Alert and oriented x 3. Normal mood.   Data Reviewed: Basic Metabolic Panel:  Recent Labs Lab 02/28/16 2124 03/01/16 0343  NA 137 136  K 4.3 4.0  CL 103 102  CO2 26 27  GLUCOSE 120* 87  BUN 19 10  CREATININE 0.89 0.94  CALCIUM 9.4 8.9   Liver Function Tests:  Recent Labs Lab 02/28/16 2124  AST 57*  ALT 66*  ALKPHOS 76  BILITOT 0.4  PROT 6.6  ALBUMIN 3.6    Recent Labs Lab 02/28/16 2124  LIPASE 33   No results for input(s): AMMONIA in the last 168 hours. CBC:  Recent Labs Lab 02/28/16 2124  WBC 11.1*  HGB 11.4*  HCT 35.3*  MCV 92.7  PLT 276   Cardiac Enzymes: No results for input(s): CKTOTAL, CKMB, CKMBINDEX, TROPONINI in the last 168 hours. CBG (last 3)  No results for input(s): GLUCAP in the last 72 hours. Recent Results (from the past 240 hour(s))  Urine culture     Status: Abnormal   Collection Time: 02/28/16  9:25 PM  Result Value Ref Range Status   Specimen Description URINE, RANDOM  Final   Special Requests NONE  Final   Culture <10,000 COLONIES/mL INSIGNIFICANT GROWTH (A)  Final   Report Status 03/01/2016 FINAL  Final      Studies: No results found.   Scheduled Meds: . aspirin EC  81 mg Oral Daily  . atorvastatin  40 mg Oral q1800  . celecoxib  200 mg Oral QHS  . ciprofloxacin  500 mg Oral BID  . citalopram  20 mg Oral QPM  . clopidogrel  75 mg Oral Daily  . docusate sodium  100 mg Oral BID  . dorzolamide  1 drop Both Eyes BID  . enoxaparin (LOVENOX) injection  30 mg Subcutaneous Q24H  . famotidine  10 mg Oral QHS  . feeding supplement (ENSURE ENLIVE)  237 mL Oral BID BM  . latanoprost  1 drop Left Eye QHS  . morphine  30 mg Oral Q12H  . pantoprazole  40 mg Oral BID  . prednisoLONE acetate  1 drop Right Eye BID  . senna  2 tablet Oral QHS  . sodium chloride flush  3 mL Intravenous Q12H   Continuous Infusions:   Active Problems:   Urinary tract infection without hematuria   Diverticulitis  Time spent:   Standley Dakinslanford Rhyse Loux, MD, FAAFP [facility] Pager [phone]  If 7PM-7AM, please contact night-coverage [email or web address] Password [email or web address] 03/03/2016, 2:40 PM    LOS: 3 days

## 2016-03-03 NOTE — Evaluation (Signed)
Physical Therapy Evaluation Patient Details Name: Alexis Yates MRN: [id number] DOB: November 28, 1927 Today's Date: 03/03/2016   History of Present Illness  Pt is an 82 y.o. female with medical history significant of CAD, diverticulosis, HTN.  Patient presents to the ED with c/o constant, lower abdominal pain for the past 1-2 days. Pt on antibiotics for UTI 5 days ago.  Clinical Impression  Pt presented supine in bed with HOB elevated, awake and willing to participate in therapy session. Prior to admission, pt reported that she was mod I with use of SPC for ambulation and independent with ADLs. Pt currently requires min guard for safety with all functional mobility and use of RW when ambulating. Pt limited this session secondary to fatigue. Pt would continue to benefit from skilled physical therapy services at this time while admitted and after d/c to address her below listed limitations in order to improve her overall safety and independence with functional mobility.    Follow Up Recommendations Home health PT;Supervision/Assistance - 24 hour    Equipment Recommendations  None recommended by PT    Recommendations for Other Services       Precautions / Restrictions Precautions Precautions: Fall Restrictions Weight Bearing Restrictions: No      Mobility  Bed Mobility Overal bed mobility: Modified Independent             General bed mobility comments: increased time  Transfers Overall transfer level: Needs assistance Equipment used: Rolling walker (2 wheeled) Transfers: Sit to/from Stand Sit to Stand: Min guard         General transfer comment: pt required increased time, good technique and min guard for safety  Ambulation/Gait Ambulation/Gait assistance: Min guard Ambulation Distance (Feet): 50 Feet Assistive device: Rolling walker (2 wheeled) Gait Pattern/deviations: Step-through pattern;Decreased stride length Gait velocity: decreased Gait velocity interpretation:  Below normal speed for age/gender General Gait Details: pt required frequent VC'ing for safety with RW  Stairs            Wheelchair Mobility    Modified Rankin (Stroke Patients Only)       Balance Overall balance assessment: Needs assistance;History of Falls Sitting-balance support: Feet supported;No upper extremity supported Sitting balance-Leahy Scale: Good Sitting balance - Comments: Posterior lean sitting EOB when unsupported Postural control: Posterior lean Standing balance support: During functional activity;Bilateral upper extremity supported Standing balance-Leahy Scale: Poor Standing balance comment: pt reliant on bilateral UEs on RW                             Pertinent Vitals/Pain Pain Assessment: Faces Faces Pain Scale: Hurts a little bit Pain Location: abdomen Pain Descriptors / Indicators: Sore Pain Intervention(s): Monitored during session;Repositioned    Home Living Family/patient expects to be discharged to:: Private residence Living Arrangements: Alone Available Help at Discharge: Family;Available PRN/intermittently ("housekeeper") Type of Home: House Home Access: Ramped entrance     Home Layout: One level Home Equipment: Walker - 4 wheels;Cane - single point;Bedside commode;Hand held shower head;Grab bars - tub/shower;Grab bars - toilet;Tub bench      Prior Function Level of Independence: Independent with assistive device(s)         Comments: Pt reported that she ambulates with use of SPC. Pt has a housekeeper that does the grocery shopping, laundry, and cleans     Hand Dominance        Extremity/Trunk Assessment   Upper Extremity Assessment Upper Extremity Assessment: Defer to OT evaluation  Lower Extremity Assessment Lower Extremity Assessment: Overall WFL for tasks assessed    Cervical / Trunk Assessment Cervical / Trunk Assessment: Kyphotic  Communication   Communication: No difficulties  Cognition  Arousal/Alertness: Awake/alert Behavior During Therapy: WFL for tasks assessed/performed Overall Cognitive Status: Within Functional Limits for tasks assessed                      General Comments      Exercises     Assessment/Plan    PT Assessment Patient needs continued PT services  PT Problem List Decreased strength;Decreased activity tolerance;Decreased balance;Decreased mobility;Decreased coordination;Decreased knowledge of use of DME;Decreased safety awareness;Pain          PT Treatment Interventions DME instruction;Gait training;Stair training;Functional mobility training;Therapeutic activities;Therapeutic exercise;Balance training;Neuromuscular re-education;Patient/family education    PT Goals (Current goals can be found in the Care Plan section)  Acute Rehab PT Goals Patient Stated Goal: return home PT Goal Formulation: With patient Time For Goal Achievement: 03/17/16 Potential to Achieve Goals: Good    Frequency Min 3X/week   Barriers to discharge        Co-evaluation               End of Session Equipment Utilized During Treatment: Gait belt Activity Tolerance: Patient limited by fatigue Patient left: in bed;with call bell/phone within reach Nurse Communication: Mobility status         Time: 6283-1517 PT Time Calculation (min) (ACUTE ONLY): 14 min   Charges:   PT Evaluation $PT Eval Moderate Complexity: 1 Procedure     PT G CodesAlessandra Bevels Emidio Warrell 03/03/2016, 5:55 PM Deborah Chalk, PT, DPT [phone]

## 2016-03-04 DIAGNOSIS — G894 Chronic pain syndrome: Secondary | ICD-10-CM

## 2016-03-04 DIAGNOSIS — I959 Hypotension, unspecified: Secondary | ICD-10-CM

## 2016-03-04 MED ORDER — CIPROFLOXACIN HCL 500 MG PO TABS: 500.0000 mg | ORAL_TABLET | Freq: Two times a day (BID) | ORAL | 0 refills | Status: DC

## 2016-03-04 MED ORDER — ENSURE ENLIVE PO LIQD
237.0000 mL | Freq: Two times a day (BID) | ORAL | 1 refills | Status: DC
Start: 2016-03-04 — End: 2016-04-16

## 2016-03-04 MED ORDER — SENNA 8.6 MG PO TABS: 2.0000 | ORAL_TABLET | Freq: Every evening | ORAL | 0 refills | Status: DC | PRN

## 2016-03-04 NOTE — Progress Notes (Signed)
Physical Therapy Treatment Patient Details Name: Alexis FateMildred A Macho MRN: [id number] DOB: 06-08-1927 Today's Date: 03/04/2016    History of Present Illness Pt is an 82 y.o. female with medical history significant of CAD, diverticulosis, HTN.  Patient presents to the ED with c/o constant, lower abdominal pain for the past 1-2 days. Pt on antibiotics for UTI 5 days ago.    PT Comments    Pt progressing towards physical therapy goals. Pt reports that she feels comfortable and confident in return home and if she feels she needs 24 hour assist she will go to her daughter's home. Pt states that she feels she is near baseline of function but is not walking as quickly as PTA. Pt was educated on general safety awareness, walking program, and was instructed in incentive spirometer. Will continue to follow and progress as able per POC.   Follow Up Recommendations  Home health PT;Supervision/Assistance - 24 hour     Equipment Recommendations  None recommended by PT    Recommendations for Other Services       Precautions / Restrictions Precautions Precautions: Fall Restrictions Weight Bearing Restrictions: No    Mobility  Bed Mobility Overal bed mobility: Modified Independent             General bed mobility comments: increased time  Transfers Overall transfer level: Needs assistance Equipment used: Straight cane Transfers: Sit to/from Stand Sit to Stand: Supervision         General transfer comment: pt required increased time, good technique and supervision for safety  Ambulation/Gait Ambulation/Gait assistance: Min guard Ambulation Distance (Feet): 100 Feet Assistive device: Straight cane Gait Pattern/deviations: Step-through pattern;Decreased stride length Gait velocity: decreased Gait velocity interpretation: Below normal speed for age/gender General Gait Details: pt required frequent VC'ing for safety with RW   Stairs            Wheelchair Mobility     Modified Rankin (Stroke Patients Only)       Balance Overall balance assessment: Needs assistance;History of Falls Sitting-balance support: Feet supported;No upper extremity supported Sitting balance-Leahy Scale: Good     Standing balance support: Single extremity supported Standing balance-Leahy Scale: Fair                      Cognition Arousal/Alertness: Awake/alert Behavior During Therapy: WFL for tasks assessed/performed Overall Cognitive Status: Within Functional Limits for tasks assessed                      Exercises      General Comments General comments (skin integrity, edema, etc.): Pt with a dry cough during session. Pt states she is concerned about it and has mentioned it to the nurse, and wants to mention it again to him. Provided and instructed pt in incentive spirometer, and notified RN of pt's concerns after session.       Pertinent Vitals/Pain Pain Assessment: No/denies pain    Home Living                      Prior Function            PT Goals (current goals can now be found in the care plan section) Acute Rehab PT Goals Patient Stated Goal: return home PT Goal Formulation: With patient Time For Goal Achievement: 03/17/16 Potential to Achieve Goals: Good Progress towards PT goals: Progressing toward goals    Frequency    Min 3X/week      PT  Plan Current plan remains appropriate    Co-evaluation             End of Session Equipment Utilized During Treatment: Gait belt Activity Tolerance: Patient tolerated treatment well Patient left: in chair;with call bell/phone within reach     Time: 1120-1152 PT Time Calculation (min) (ACUTE ONLY): 32 min  Charges:  $Gait Training: 8-22 mins $Therapeutic Activity: 8-22 mins                    G Codes:      Marylynn Pearson 03-10-2016, 1:20 PM

## 2016-03-04 NOTE — Care Management Note (Signed)
Case Management Note  Patient Details  Name: Alexis Yates MRN: [id number] Date of Birth: 1928-01-12  Subjective/Objective:         Admitted with UTI.           Action/Plan: Plan is to d/c to home today. Pt declines home health PT. States she will continue outpatient PT( [facility] [phone]) which was established PTA.  Expected Discharge Date:  03/04/2016              Expected Discharge Plan:  Home with selfcare  In-House Referral:     Discharge planning Services  CM Consult   Status of Service:  Completed, signed off  If discussed at Long Length of Stay Meetings, dates discussed:    Additional Comments:  Epifanio Lesches, RN 03/04/2016, 12:47 PM

## 2016-03-04 NOTE — Progress Notes (Signed)
Alexis Yates to be D/C'd Home per MD order.  Discussed with the patient and all questions fully answered.  An After Visit Summary was printed and given to the patient. Patient received prescription.  D/c education completed with patient/family including follow up instructions, medication list, d/c activities limitations if indicated, with other d/c instructions as indicated by MD - patient able to verbalize understanding, all questions fully answered.   Patient instructed to return to ED, call 911, or call MD for any changes in condition.   Patient to be escorted via WC, and D/C home via private auto.  L'ESPERANCE, [name] C 03/04/2016 1:00 PM

## 2016-03-04 NOTE — Care Management Important Message (Signed)
Important Message  Patient Details  Name: JALEA GASPAR MRN: [id number] Date of Birth: [birth year]   Medicare Important Message Given:  Yes    Kyla Balzarine 03/04/2016, 10:57 AM

## 2016-03-04 NOTE — Discharge Summary (Signed)
Physician Discharge Summary  Alexis FateMildred A Yates QMV:[id number] DOB: 03/07/1928 DOA: 02/29/2016  PCP: Pearla DubonnetGATES,ROBERT NEVILL, MD  Admit date: 02/29/2016 Discharge date: 03/04/2016  Admitted From: Home  Disposition:  Home   Recommendations for Outpatient Follow-up:  1. Follow up with PCP in 1-2 weeks 2. Please obtain BMP/CBC in one week  3.  Discharge Condition: STABLE CODE STATUS: FULL   Brief/Interim Summary: HPI: Alexis Yates is a 82 y.o. female with medical history significant of CAD, diverticulosis, HTN.  Patient presents to the ED with c/o constant, lower abdominal pain for the past 1-2 days.  Pain is pinching, burning, and cramping.  Patient actually got put on cipro for a UTI 5 days ago and this abdominal pain has developed in spite of this.  Nothing makes pain worse, gets better at times while sitting.  No fever, chills, N/V, SOB.  ED Course: Has UTI (in spite of cipro), Exam suspicious for diverticulitis, CT is equivocal for diverticulitis (has severe diverticulosis and diverticulitis may be present.  Assessment & Plan:   UTI - urine culture with insignificant growth.    Mild Diverticulitis - improved, abdominal pain resolved and eating much better.  Pt does not tolerate oral flagyl.  Will do 5 more days of ciprofloxacin and have her follow up with her PCP.  Nausea has resolved now.    Nausea and vomiting - Resolved now.   Essential Hypertension - soft BPs much improved after hydration.  Mild dehydration - treated with IVFs, cutting down the rate as she is eating and drinking a bit more, hopefully can stop tomorrow.    Falls and weakness - PT recommending Home health PT which is being arranged prior to discharge, patient agreed.  Pt ambulating halls with PT.    Discharge Diagnoses:  Active Problems:   Urinary tract infection without hematuria   Diverticulitis  Discharge Instructions  Discharge Instructions    Increase activity slowly    Complete by:  As  directed      Allergies as of 03/04/2016      Reactions   Penicillins Anaphylaxis   Has patient had a PCN reaction causing immediate rash, facial/tongue/throat swelling, SOB or lightheadedness with hypotension: Yes Has patient had a PCN reaction causing severe rash involving mucus membranes or skin necrosis: No Has patient had a PCN reaction that required hospitalization Yes Has patient had a PCN reaction occurring within the last 10 years: No If all of the above answers are "NO", then may proceed with Cephalosporin use.   Phenergan [promethazine Hcl] Other (See Comments)   Restless legs      Medication List    STOP taking these medications   metoprolol tartrate 25 MG tablet Commonly known as:  LOPRESSOR   pantoprazole 40 MG tablet Commonly known as:  PROTONIX     TAKE these medications   ALPRAZolam 0.25 MG tablet Commonly known as:  XANAX Take 0.25 mg by mouth 2 (two) times daily as needed for anxiety.   aspirin 81 MG EC tablet Take 1 tablet (81 mg total) by mouth daily.   atorvastatin 40 MG tablet Commonly known as:  LIPITOR Take 1 tablet (40 mg total) by mouth daily at 6 PM.   bimatoprost 0.01 % Soln Commonly known as:  LUMIGAN Place 1 drop into the left eye at bedtime.   calcium-vitamin D 250-100 MG-UNIT tablet Take 1 tablet by mouth 2 (two) times daily.   celecoxib 200 MG capsule Commonly known as:  CELEBREX Take 200 mg by  mouth daily.   ciprofloxacin 500 MG tablet Commonly known as:  CIPRO Take 1 tablet (500 mg total) by mouth 2 (two) times daily.   citalopram 20 MG tablet Commonly known as:  CELEXA Take 20 mg by mouth every evening.   clopidogrel 75 MG tablet Commonly known as:  PLAVIX Take 1 tablet (75 mg total) by mouth daily.   docusate sodium 100 MG capsule Commonly known as:  COLACE Take 100 mg by mouth 2 (two) times daily.   dorzolamide 2 % ophthalmic solution Commonly known as:  TRUSOPT Place 1 drop into both eyes 2 (two) times daily.    feeding supplement (ENSURE ENLIVE) Liqd Take 237 mLs by mouth 2 (two) times daily between meals.   lansoprazole 30 MG capsule Commonly known as:  PREVACID Take 30 mg by mouth daily before breakfast.   lisinopril 20 MG tablet Commonly known as:  PRINIVIL,ZESTRIL Take 10 mg by mouth daily.   nitroGLYCERIN 0.4 MG SL tablet Commonly known as:  NITROSTAT Place 1 tablet (0.4 mg total) under the tongue every 5 (five) minutes as needed for chest pain.   oxyCODONE 5 MG immediate release tablet Commonly known as:  Oxy IR/ROXICODONE Take 5 mg by mouth 3 (three) times daily as needed for severe pain or breakthrough pain (pain).   oxymorphone 10 MG T12a 12 hr tablet Commonly known as:  OPANA ER Take 10 mg by mouth every 12 (twelve) hours.   prednisoLONE acetate 1 % ophthalmic suspension Commonly known as:  PRED FORTE Place 1 drop into the right eye 2 (two) times daily.   senna 8.6 MG Tabs tablet Commonly known as:  SENOKOT Take 2 tablets (17.2 mg total) by mouth at bedtime as needed for mild constipation.   SYSTANE ULTRA 0.4-0.3 % Soln Generic drug:  Polyethyl Glycol-Propyl Glycol Place 1 drop into both eyes 2 (two) times daily.   Vitamin D (Cholecalciferol) 1000 units Tabs Take 1,000 Units by mouth daily.      Follow-up Information    GATES,ROBERT NEVILL, MD. Schedule an appointment as soon as possible for a visit in 2 week(s).   Specialty:  Internal Medicine Contact information: [address] [place] [address] [phone]        Bryan Lemma, MD. Schedule an appointment as soon as possible for a visit in 3 week(s).   Specialty:  Cardiology Contact information: [address] [place] [address] [phone]          Allergies  Allergen Reactions  . Penicillins Anaphylaxis    Has patient had a PCN reaction causing immediate rash, facial/tongue/throat swelling, SOB or lightheadedness with hypotension: Yes Has patient had a  PCN reaction causing severe rash involving mucus membranes or skin necrosis: No Has patient had a PCN reaction that required hospitalization Yes Has patient had a PCN reaction occurring within the last 10 years: No If all of the above answers are "NO", then may proceed with Cephalosporin use.   Marland Kitchen Phenergan [Promethazine Hcl] Other (See Comments)    Restless legs    Procedures/Studies: Ct Abdomen Pelvis W Contrast  Result Date: 02/29/2016 CLINICAL DATA:  82 year old female with left lower quadrant abdominal pain. EXAM: CT ABDOMEN AND PELVIS WITH CONTRAST TECHNIQUE: Multidetector CT imaging of the abdomen and pelvis was performed using the standard protocol following bolus administration of intravenous contrast. CONTRAST:  ISOVUE-300 IOPAMIDOL (ISOVUE-300) INJECTION 61% COMPARISON:  None. FINDINGS: Lower chest: The visualized lung bases are clear. There is coronary vascular  calcification. No intra-abdominal free air or free fluid Hepatobiliary: No focal liver abnormality is seen. No gallstones, gallbladder wall thickening, or biliary dilatation. Pancreas: Unremarkable. No pancreatic ductal dilatation or surrounding inflammatory changes. Spleen: Normal in size without focal abnormality. Adrenals/Urinary Tract: The adrenal glands, kidneys, visualized ureters, and urinary bladder appear unremarkable. Stomach/Bowel: There is extensive sigmoid and descending colon diverticulosis with muscular hypertrophy. Mild pericolonic haziness along the descending colon may be related to chronic inflammation/ scarring, mild acute diverticulitis is not entirely excluded. Clinical correlation is recommended. There is no evidence of diverticular perforation or abscess. Moderate stool noted in the proximal colon. No evidence of bowel obstruction. Normal appendix. Vascular/Lymphatic: There is advanced aortoiliac atherosclerotic disease. No aneurysmal dilatation or evidence of dissection. The origins of the celiac axis is  patent. There is chronic appearing thrombus or noncalcified plaque at the origin of the SMA with focal narrowing of the vessel. The SMA however remains patent. The IMA is patent as well. The SMV, splenic vein, and main portal veins are patent. No portal venous gas identified. There is no adenopathy. Reproductive: Hysterectomy.  A pessary is noted within the pelvis. Other: None Musculoskeletal: Osteopenia with scoliosis and degenerative changes of the spine. Multilevel disc desiccation with vacuum phenomena. No acute fracture. IMPRESSION: Extensive sigmoid and descending colonic diverticulosis with muscular hypertrophy and pericolonic scarring. Mild acute diverticulitis is not entirely excluded. Clinical correlation is recommended. No bowel obstruction. Normal appendix. Electronically Signed   By: Elgie Collard M.D.   On: 02/29/2016 04:16    Subjective: Pt eating and drinking well and abdominal pain improved.   Discharge Exam: Vitals:   03/03/16 2135 03/04/16 0522  BP: (!) 101/57 110/71  Pulse: 74 69  Resp: 19 18  Temp: 98.1 F (36.7 C) 97.5 F (36.4 C)   Vitals:   03/03/16 0526 03/03/16 1351 03/03/16 2135 03/04/16 0522  BP: 131/76 (!) 102/53 (!) 101/57 110/71  Pulse: 79 81 74 69  Resp: 18 16 19 18   Temp: 97.7 F (36.5 C) 97.7 F (36.5 C) 98.1 F (36.7 C) 97.5 F (36.4 C)  TempSrc: Oral Oral Oral Oral  SpO2: 95% 95% 100% 96%  Weight:      Height:       General: Pt is alert, awake, not in acute distress Cardiovascular: RRR, S1/S2 +, no rubs, no gallops Respiratory: CTA bilaterally, no wheezing, no rhonchi Abdominal: Soft, NT, ND, bowel sounds + Extremities: no edema, no cyanosis  The results of significant diagnostics from this hospitalization (including imaging, microbiology, ancillary and laboratory) are listed below for reference.     Microbiology: Recent Results (from the past 240 hour(s))  Urine culture     Status: Abnormal   Collection Time: 02/28/16  9:25 PM   Result Value Ref Range Status   Specimen Description URINE, RANDOM  Final   Special Requests NONE  Final   Culture <10,000 COLONIES/mL INSIGNIFICANT GROWTH (A)  Final   Report Status 03/01/2016 FINAL  Final     Labs: BNP (last 3 results) No results for input(s): BNP in the last 8760 hours. Basic Metabolic Panel:  Recent Labs Lab 02/28/16 2124 03/01/16 0343  NA 137 136  K 4.3 4.0  CL 103 102  CO2 26 27  GLUCOSE 120* 87  BUN 19 10  CREATININE 0.89 0.94  CALCIUM 9.4 8.9   Liver Function Tests:  Recent Labs Lab 02/28/16 2124  AST 57*  ALT 66*  ALKPHOS 76  BILITOT 0.4  PROT 6.6  ALBUMIN 3.6  Recent Labs Lab 02/28/16 2124  LIPASE 33   No results for input(s): AMMONIA in the last 168 hours. CBC:  Recent Labs Lab 02/28/16 2124  WBC 11.1*  HGB 11.4*  HCT 35.3*  MCV 92.7  PLT 276   Cardiac Enzymes: No results for input(s): CKTOTAL, CKMB, CKMBINDEX, TROPONINI in the last 168 hours. BNP: Invalid input(s): POCBNP CBG: No results for input(s): GLUCAP in the last 168 hours. D-Dimer No results for input(s): DDIMER in the last 72 hours. Hgb A1c No results for input(s): HGBA1C in the last 72 hours. Lipid Profile No results for input(s): CHOL, HDL, LDLCALC, TRIG, CHOLHDL, LDLDIRECT in the last 72 hours. Thyroid function studies No results for input(s): TSH, T4TOTAL, T3FREE, THYROIDAB in the last 72 hours.  Invalid input(s): FREET3 Anemia work up No results for input(s): VITAMINB12, FOLATE, FERRITIN, TIBC, IRON, RETICCTPCT in the last 72 hours. Urinalysis    Component Value Date/Time   COLORURINE YELLOW 02/28/2016 2125   APPEARANCEUR CLEAR 02/28/2016 2125   LABSPEC 1.015 02/28/2016 2125   PHURINE 5.5 02/28/2016 2125   GLUCOSEU NEGATIVE 02/28/2016 2125   HGBUR NEGATIVE 02/28/2016 2125   BILIRUBINUR NEGATIVE 02/28/2016 2125   KETONESUR NEGATIVE 02/28/2016 2125   PROTEINUR NEGATIVE 02/28/2016 2125   UROBILINOGEN 0.2 05/27/2014 1122   NITRITE NEGATIVE  02/28/2016 2125   LEUKOCYTESUR MODERATE (A) 02/28/2016 2125   Sepsis Labs Invalid input(s): PROCALCITONIN,  WBC,  LACTICIDVEN Microbiology Recent Results (from the past 240 hour(s))  Urine culture     Status: Abnormal   Collection Time: 02/28/16  9:25 PM  Result Value Ref Range Status   Specimen Description URINE, RANDOM  Final   Special Requests NONE  Final   Culture <10,000 COLONIES/mL INSIGNIFICANT GROWTH (A)  Final   Report Status 03/01/2016 FINAL  Final   Time coordinating discharge: 31 minutes  SIGNED:  Standley Dakins, MD  [facility] 03/04/2016, 11:48 AM Pager   If 7PM-7AM, please contact night-coverage [email or web address] Password [email or web address]

## 2016-03-06 ENCOUNTER — Inpatient Hospital Stay (HOSPITAL_COMMUNITY)
Admission: EM | Admit: 2016-03-06 | Discharge: 2016-03-09 | DRG: 392 | Disposition: A | Discharge status: Skilled Nursing Facility | Payer: Medicare Other | Attending: Internal Medicine | Admitting: Internal Medicine

## 2016-03-06 ENCOUNTER — Emergency Department (HOSPITAL_COMMUNITY): Payer: Medicare Other

## 2016-03-06 ENCOUNTER — Encounter (HOSPITAL_COMMUNITY): Payer: Self-pay | Admitting: Certified Nurse Midwife

## 2016-03-06 DIAGNOSIS — M419 Scoliosis, unspecified: Secondary | ICD-10-CM | POA: Diagnosis present

## 2016-03-06 DIAGNOSIS — H409 Unspecified glaucoma: Secondary | ICD-10-CM | POA: Diagnosis not present

## 2016-03-06 DIAGNOSIS — R531 Weakness: Secondary | ICD-10-CM

## 2016-03-06 DIAGNOSIS — R109 Unspecified abdominal pain: Secondary | ICD-10-CM

## 2016-03-06 DIAGNOSIS — N390 Urinary tract infection, site not specified: Secondary | ICD-10-CM | POA: Diagnosis present

## 2016-03-06 DIAGNOSIS — I10 Essential (primary) hypertension: Secondary | ICD-10-CM | POA: Diagnosis present

## 2016-03-06 DIAGNOSIS — Z955 Presence of coronary angioplasty implant and graft: Secondary | ICD-10-CM | POA: Diagnosis not present

## 2016-03-06 DIAGNOSIS — Z7902 Long term (current) use of antithrombotics/antiplatelets: Secondary | ICD-10-CM

## 2016-03-06 DIAGNOSIS — R112 Nausea with vomiting, unspecified: Secondary | ICD-10-CM | POA: Diagnosis not present

## 2016-03-06 DIAGNOSIS — Z8249 Family history of ischemic heart disease and other diseases of the circulatory system: Secondary | ICD-10-CM

## 2016-03-06 DIAGNOSIS — K5732 Diverticulitis of large intestine without perforation or abscess without bleeding: Secondary | ICD-10-CM | POA: Diagnosis not present

## 2016-03-06 DIAGNOSIS — G8929 Other chronic pain: Secondary | ICD-10-CM | POA: Diagnosis present

## 2016-03-06 DIAGNOSIS — Z833 Family history of diabetes mellitus: Secondary | ICD-10-CM | POA: Diagnosis not present

## 2016-03-06 DIAGNOSIS — Z880 Allergy status to penicillin: Secondary | ICD-10-CM

## 2016-03-06 DIAGNOSIS — R1012 Left upper quadrant pain: Secondary | ICD-10-CM | POA: Diagnosis not present

## 2016-03-06 DIAGNOSIS — N8110 Cystocele, unspecified: Secondary | ICD-10-CM | POA: Diagnosis present

## 2016-03-06 DIAGNOSIS — Z79899 Other long term (current) drug therapy: Secondary | ICD-10-CM

## 2016-03-06 DIAGNOSIS — Z7982 Long term (current) use of aspirin: Secondary | ICD-10-CM

## 2016-03-06 DIAGNOSIS — R5383 Other fatigue: Secondary | ICD-10-CM

## 2016-03-06 DIAGNOSIS — R740 Nonspecific elevation of levels of transaminase and lactic acid dehydrogenase [LDH]: Secondary | ICD-10-CM | POA: Diagnosis not present

## 2016-03-06 DIAGNOSIS — K219 Gastro-esophageal reflux disease without esophagitis: Secondary | ICD-10-CM | POA: Diagnosis present

## 2016-03-06 DIAGNOSIS — D638 Anemia in other chronic diseases classified elsewhere: Secondary | ICD-10-CM | POA: Diagnosis present

## 2016-03-06 DIAGNOSIS — D649 Anemia, unspecified: Secondary | ICD-10-CM | POA: Diagnosis present

## 2016-03-06 DIAGNOSIS — I252 Old myocardial infarction: Secondary | ICD-10-CM

## 2016-03-06 DIAGNOSIS — I2510 Atherosclerotic heart disease of native coronary artery without angina pectoris: Secondary | ICD-10-CM | POA: Diagnosis present

## 2016-03-06 DIAGNOSIS — R197 Diarrhea, unspecified: Secondary | ICD-10-CM | POA: Diagnosis present

## 2016-03-06 DIAGNOSIS — R7401 Elevation of levels of liver transaminase levels: Secondary | ICD-10-CM | POA: Diagnosis present

## 2016-03-06 HISTORY — DX: Nonspecific elevation of levels of transaminase and lactic acid dehydrogenase (ldh): R74.0

## 2016-03-06 HISTORY — DX: Elevation of levels of liver transaminase levels: R74.01

## 2016-03-06 HISTORY — DX: Urinary tract infection, site not specified: N39.0

## 2016-03-06 HISTORY — DX: Other chronic pain: G89.29

## 2016-03-06 LAB — URINALYSIS, ROUTINE W REFLEX MICROSCOPIC
BACTERIA UA: NONE SEEN
Bilirubin Urine: NEGATIVE
Glucose, UA: NEGATIVE mg/dL
HGB URINE DIPSTICK: NEGATIVE
Ketones, ur: NEGATIVE mg/dL
Nitrite: NEGATIVE
PROTEIN: NEGATIVE mg/dL
SPECIFIC GRAVITY, URINE: 1.013 (ref 1.005–1.030)
pH: 7 (ref 5.0–8.0)

## 2016-03-06 LAB — CBC
HEMATOCRIT: 33.4 % — AB (ref 36.0–46.0)
HEMOGLOBIN: 10.8 g/dL — AB (ref 12.0–15.0)
MCH: 30 pg (ref 26.0–34.0)
MCHC: 32.3 g/dL (ref 30.0–36.0)
MCV: 92.8 fL (ref 78.0–100.0)
Platelets: 264 10*3/uL (ref 150–400)
RBC: 3.6 MIL/uL — ABNORMAL LOW (ref 3.87–5.11)
RDW: 14.3 % (ref 11.5–15.5)
WBC: 6.2 10*3/uL (ref 4.0–10.5)

## 2016-03-06 LAB — COMPREHENSIVE METABOLIC PANEL
ALBUMIN: 3.1 g/dL — AB (ref 3.5–5.0)
ALK PHOS: 89 U/L (ref 38–126)
ALT: 72 U/L — ABNORMAL HIGH (ref 14–54)
ANION GAP: 7 (ref 5–15)
AST: 96 U/L — ABNORMAL HIGH (ref 15–41)
BUN: 7 mg/dL (ref 6–20)
CALCIUM: 9 mg/dL (ref 8.9–10.3)
CHLORIDE: 108 mmol/L (ref 101–111)
CO2: 25 mmol/L (ref 22–32)
Creatinine, Ser: 0.84 mg/dL (ref 0.44–1.00)
GFR calc non Af Amer: >60 mL/min (ref >60)
GLUCOSE: 155 mg/dL — AB (ref 65–99)
POTASSIUM: 3.8 mmol/L (ref 3.5–5.1)
SODIUM: 140 mmol/L (ref 135–145)
Total Bilirubin: 0.4 mg/dL (ref 0.3–1.2)
Total Protein: 5.9 g/dL — ABNORMAL LOW (ref 6.5–8.1)

## 2016-03-06 LAB — GLUCOSE, CAPILLARY
GLUCOSE-CAPILLARY: 108 mg/dL — AB (ref 65–99)
Glucose-Capillary: 108 mg/dL — ABNORMAL HIGH (ref 65–99)

## 2016-03-06 LAB — LIPASE, BLOOD: LIPASE: 18 U/L (ref 11–51)

## 2016-03-06 MED ORDER — ALPRAZOLAM 0.25 MG PO TABS
0.2500 mg | ORAL_TABLET | Freq: Once | ORAL | Status: AC
  Administered 2016-03-06: 0.25 mg via ORAL
  Filled 2016-03-06: qty 1

## 2016-03-06 MED ORDER — HYDROMORPHONE HCL 2 MG/ML IJ SOLN
0.5000 mg | Freq: Once | INTRAMUSCULAR | Status: AC
  Administered 2016-03-06: 0.5 mg via INTRAVENOUS
  Filled 2016-03-06: qty 1

## 2016-03-06 MED ORDER — LISINOPRIL 10 MG PO TABS
10.0000 mg | ORAL_TABLET | Freq: Every day | ORAL | Status: DC
  Administered 2016-03-06 – 2016-03-09 (×4): 10 mg via ORAL
  Filled 2016-03-06 (×4): qty 1

## 2016-03-06 MED ORDER — ACETAMINOPHEN 650 MG RE SUPP: 650.0000 mg | Freq: Four times a day (QID) | RECTAL | Status: DC | PRN

## 2016-03-06 MED ORDER — CLOPIDOGREL BISULFATE 75 MG PO TABS
75.0000 mg | ORAL_TABLET | Freq: Every day | ORAL | Status: DC
  Administered 2016-03-06 – 2016-03-09 (×4): 75 mg via ORAL
  Filled 2016-03-06 (×4): qty 1

## 2016-03-06 MED ORDER — ACETAMINOPHEN 325 MG PO TABS: 650.0000 mg | ORAL_TABLET | Freq: Four times a day (QID) | ORAL | Status: DC | PRN

## 2016-03-06 MED ORDER — ATORVASTATIN CALCIUM 40 MG PO TABS
40.0000 mg | ORAL_TABLET | Freq: Every day | ORAL | Status: DC
  Administered 2016-03-07 – 2016-03-09 (×3): 40 mg via ORAL
  Filled 2016-03-06 (×4): qty 1

## 2016-03-06 MED ORDER — SODIUM CHLORIDE 0.9 % IV BOLUS (SEPSIS)
1000.0000 mL | Freq: Once | INTRAVENOUS | Status: AC
  Administered 2016-03-06: 1000 mL via INTRAVENOUS

## 2016-03-06 MED ORDER — ASPIRIN EC 81 MG PO TBEC
81.0000 mg | DELAYED_RELEASE_TABLET | Freq: Every day | ORAL | Status: DC
  Administered 2016-03-06 – 2016-03-09 (×4): 81 mg via ORAL
  Filled 2016-03-06 (×4): qty 1

## 2016-03-06 MED ORDER — CELECOXIB 200 MG PO CAPS
200.0000 mg | ORAL_CAPSULE | Freq: Every day | ORAL | Status: DC
  Filled 2016-03-06: qty 1

## 2016-03-06 MED ORDER — CITALOPRAM HYDROBROMIDE 20 MG PO TABS
20.0000 mg | ORAL_TABLET | Freq: Every evening | ORAL | Status: DC
  Administered 2016-03-06 – 2016-03-09 (×4): 20 mg via ORAL
  Filled 2016-03-06 (×4): qty 1

## 2016-03-06 MED ORDER — ASPIRIN EC 81 MG PO TBEC: 81.0000 mg | DELAYED_RELEASE_TABLET | Freq: Every day | ORAL | Status: DC

## 2016-03-06 MED ORDER — HYPROMELLOSE (GONIOSCOPIC) 2.5 % OP SOLN
1.0000 [drp] | Freq: Two times a day (BID) | OPHTHALMIC | Status: DC
  Administered 2016-03-08: 1 [drp] via OPHTHALMIC
  Filled 2016-03-06: qty 15

## 2016-03-06 MED ORDER — SODIUM CHLORIDE 0.9 % IV SOLN
INTRAVENOUS | Status: AC
  Administered 2016-03-06: 20:00:00 via INTRAVENOUS

## 2016-03-06 MED ORDER — PANTOPRAZOLE SODIUM 20 MG PO TBEC
20.0000 mg | DELAYED_RELEASE_TABLET | Freq: Every day | ORAL | Status: DC
  Administered 2016-03-07 – 2016-03-09 (×3): 20 mg via ORAL
  Filled 2016-03-06 (×3): qty 1

## 2016-03-06 MED ORDER — OXYCODONE HCL 5 MG PO TABS
5.0000 mg | ORAL_TABLET | Freq: Once | ORAL | Status: AC
  Administered 2016-03-06: 5 mg via ORAL
  Filled 2016-03-06: qty 1

## 2016-03-06 MED ORDER — ALPRAZOLAM 0.25 MG PO TABS
0.2500 mg | ORAL_TABLET | Freq: Two times a day (BID) | ORAL | Status: DC | PRN
  Administered 2016-03-06 – 2016-03-08 (×4): 0.25 mg via ORAL
  Filled 2016-03-06 (×4): qty 1

## 2016-03-06 MED ORDER — DORZOLAMIDE HCL 2 % OP SOLN
1.0000 [drp] | Freq: Two times a day (BID) | OPHTHALMIC | Status: DC
  Administered 2016-03-06 – 2016-03-09 (×6): 1 [drp] via OPHTHALMIC
  Filled 2016-03-06: qty 10

## 2016-03-06 MED ORDER — DOCUSATE SODIUM 100 MG PO CAPS
100.0000 mg | ORAL_CAPSULE | Freq: Two times a day (BID) | ORAL | Status: DC
  Administered 2016-03-07 – 2016-03-09 (×5): 100 mg via ORAL
  Filled 2016-03-06 (×6): qty 1

## 2016-03-06 MED ORDER — MORPHINE SULFATE ER 15 MG PO TBCR
30.0000 mg | EXTENDED_RELEASE_TABLET | Freq: Two times a day (BID) | ORAL | Status: DC
  Administered 2016-03-06 – 2016-03-09 (×6): 30 mg via ORAL
  Filled 2016-03-06 (×6): qty 2

## 2016-03-06 MED ORDER — OXYCODONE HCL 5 MG PO TABS
5.0000 mg | ORAL_TABLET | Freq: Three times a day (TID) | ORAL | Status: DC | PRN
  Administered 2016-03-06 – 2016-03-09 (×6): 5 mg via ORAL
  Filled 2016-03-06 (×6): qty 1

## 2016-03-06 MED ORDER — LATANOPROST 0.005 % OP SOLN
1.0000 [drp] | Freq: Every day | OPHTHALMIC | Status: DC
  Filled 2016-03-06: qty 2.5

## 2016-03-06 MED ORDER — CITALOPRAM HYDROBROMIDE 20 MG PO TABS: 20.0000 mg | ORAL_TABLET | Freq: Every evening | ORAL | Status: DC

## 2016-03-06 MED ORDER — CLOPIDOGREL BISULFATE 75 MG PO TABS: 75.0000 mg | ORAL_TABLET | Freq: Every day | ORAL | Status: DC

## 2016-03-06 MED ORDER — POLYETHYL GLYCOL-PROPYL GLYCOL 0.4-0.3 % OP SOLN: 1.0000 [drp] | Freq: Two times a day (BID) | OPHTHALMIC | Status: DC

## 2016-03-06 MED ORDER — VITAMIN D 1000 UNITS PO TABS
1000.0000 [IU] | ORAL_TABLET | Freq: Every day | ORAL | Status: DC
  Administered 2016-03-06 – 2016-03-09 (×4): 1000 [IU] via ORAL
  Filled 2016-03-06 (×4): qty 1

## 2016-03-06 MED ORDER — SODIUM CHLORIDE 0.9 % IV SOLN
1.0000 g | Freq: Two times a day (BID) | INTRAVENOUS | Status: DC
  Administered 2016-03-07: 1 g via INTRAVENOUS
  Filled 2016-03-06: qty 1

## 2016-03-06 MED ORDER — LISINOPRIL 10 MG PO TABS: 10.0000 mg | ORAL_TABLET | Freq: Every day | ORAL | Status: DC

## 2016-03-06 MED ORDER — ENSURE ENLIVE PO LIQD: 237.0000 mL | Freq: Two times a day (BID) | ORAL | Status: DC

## 2016-03-06 MED ORDER — SODIUM CHLORIDE 0.9 % IV SOLN
500.0000 mg | Freq: Once | INTRAVENOUS | Status: AC
  Administered 2016-03-06: 500 mg via INTRAVENOUS
  Filled 2016-03-06: qty 0.5

## 2016-03-06 MED ORDER — SENNA 8.6 MG PO TABS
2.0000 | ORAL_TABLET | Freq: Every evening | ORAL | Status: DC | PRN
  Administered 2016-03-08: 17.2 mg via ORAL
  Filled 2016-03-06: qty 2

## 2016-03-06 MED ORDER — INSULIN ASPART 100 UNIT/ML ~~LOC~~ SOLN: 0.0000 [IU] | Freq: Three times a day (TID) | SUBCUTANEOUS | Status: DC

## 2016-03-06 MED ORDER — PREDNISOLONE ACETATE 1 % OP SUSP
1.0000 [drp] | Freq: Two times a day (BID) | OPHTHALMIC | Status: DC
  Administered 2016-03-06 – 2016-03-09 (×6): 1 [drp] via OPHTHALMIC
  Filled 2016-03-06 (×2): qty 5

## 2016-03-06 MED ORDER — CELECOXIB 200 MG PO CAPS
200.0000 mg | ORAL_CAPSULE | Freq: Every day | ORAL | Status: DC
  Administered 2016-03-06 – 2016-03-09 (×4): 200 mg via ORAL
  Filled 2016-03-06 (×4): qty 1

## 2016-03-06 MED ORDER — CALCIUM CITRATE-VITAMIN D 250-100 MG-UNIT PO TABS: 1.0000 | ORAL_TABLET | Freq: Two times a day (BID) | ORAL | Status: DC

## 2016-03-06 NOTE — Progress Notes (Signed)
Talked to MD on call, Dr. Mickle Mallory ordered to administer extra dose of xanax now for pt's C/O of restless leg syndrome.

## 2016-03-06 NOTE — Progress Notes (Signed)
ANTIBIOTIC CONSULT NOTE - INITIAL  Pharmacy Consult for Merrem Indication: Refractory UTI  Allergies  Allergen Reactions  . Penicillins Anaphylaxis    Has patient had a PCN reaction causing immediate rash, facial/tongue/throat swelling, SOB or lightheadedness with hypotension: Yes Has patient had a PCN reaction causing severe rash involving mucus membranes or skin necrosis: No Has patient had a PCN reaction that required hospitalization Yes Has patient had a PCN reaction occurring within the last 10 years: No If all of the above answers are "NO", then may proceed with Cephalosporin use.   Marland Kitchen Phenergan [Promethazine Hcl] Other (See Comments)    Restless legs    Patient Measurements: Height: 5' (152.4 cm) Weight: 111 lb (50.3 kg) IBW/kg (Calculated) : 45.5 Adjusted Body Weight:    Vital Signs: Temp: 98 F (36.7 C) (12/23 1838) Temp Source: Oral (12/23 1838) BP: 148/77 (12/23 1838) Pulse Rate: 80 (12/23 1838) Intake/Output from previous day: No intake/output data recorded. Intake/Output from this shift: Total I/O In: 1050 [IV Piggyback:1050] Out: -   Labs:  Recent Labs  03/06/16 1158  WBC 6.2  HGB 10.8*  PLT 264  CREATININE 0.84   Estimated Creatinine Clearance: 34.5 mL/min (by C-G formula based on SCr of 0.84 mg/dL). No results for input(s): VANCOTROUGH, VANCOPEAK, VANCORANDOM, GENTTROUGH, GENTPEAK, GENTRANDOM, TOBRATROUGH, TOBRAPEAK, TOBRARND, AMIKACINPEAK, AMIKACINTROU, AMIKACIN in the last 72 hours.   Microbiology: Recent Results (from the past 720 hour(s))  Urine culture     Status: Abnormal   Collection Time: 02/28/16  9:25 PM  Result Value Ref Range Status   Specimen Description URINE, RANDOM  Final   Special Requests NONE  Final   Culture <10,000 COLONIES/mL INSIGNIFICANT GROWTH (A)  Final   Report Status 03/01/2016 FINAL  Final    Medical History: Past Medical History:  Diagnosis Date  . Arthritis   . Atherosclerosis of aorta (HCC)   . CAD  (coronary artery disease)    a. 10/27 PCI with DES to RCA, diffuse nonobstructive disease, EF 50-55%  . Chronic pain   . Diverticulosis   . Elevated transaminase level   . GERD (gastroesophageal reflux disease)   . Glaucoma   . Hiatal hernia   . Hypertension   . Lung nodules    Right  . MI (myocardial infarction) 01/09/2016  . Scoliosis   . UTI (urinary tract infection)    refractory    Assessment: 82 y/o F just discharged 12/21 from UTI, mild diverticulitis. Was discharged on Cipro. Now readmitted with weakness, nausea, and diarrhea= refractory UTI  ID: Refractory UTI on Cipro PTA. WBC 6.2  Antimicrobials this admission:  Flagyl 12/17>12/20 (diverticulitis) Bactrim 12/17>12/20 Cipro 12/20>12/23 Merrem 12/23>>  Goal of Therapy:  Eradication of infection  Plan:  Merrem 1g IV q12hrs   Elliona Doddridge S. Merilynn Finland, PharmD, BCPS Clinical Staff Pharmacist Pager [phone]  Misty Stanley Stillinger 03/06/2016,6:59 PM

## 2016-03-06 NOTE — ED Provider Notes (Signed)
[facility]-EMERGENCY DEPT Provider Note   CSN: [id number] Arrival date & time: 03/06/16  1146     History   Chief Complaint Chief Complaint  Patient presents with  . Shortness of Breath  . Emesis  . Diarrhea    HPI Alexis Yates is a 82 y.o. female.  HPI Patient presents with weakness, anorexia, nausea, loose stool. Patient has multiple medical issues and was recently hospitalized for urinary tract infection, diverticulitis. Since discharge 2 days ago she has had progressive weakness, anorexia, minimal oral intake, and ongoing loose stool. No new fever, no confusion, no syncope, no chest pain. She has been attempting to take medication, though she was unable to do so today. Patient is here with 2 daughters who assist with the history of present illness.  Past Medical History:  Diagnosis Date  . Arthritis   . Atherosclerosis of aorta (HCC)   . CAD (coronary artery disease)    a. 10/27 PCI with DES to RCA, diffuse nonobstructive disease, EF 50-55%  . Diverticulosis   . GERD (gastroesophageal reflux disease)   . Glaucoma   . Hiatal hernia   . Hypertension   . Lung nodules    Right  . MI (myocardial infarction) 01/09/2016  . Scoliosis     Patient Active Problem List   Diagnosis Date Noted  . Diverticulitis 02/29/2016  . ST elevation myocardial infarction (STEMI) of inferior wall, initial episode of care (HCC) 01/09/2016  . Urinary tract infection without hematuria   . NSTEMI (non-ST elevated myocardial infarction) (HCC) 01/08/2016  . UTI (urinary tract infection) 05/27/2014  . PID (acute pelvic inflammatory disease) 05/27/2014  . Vaginal inflammation from pessary (HCC) 05/27/2014  . Leukocytosis 05/27/2014  . Nausea vomiting and diarrhea 05/27/2014  . Hypertension   . Hiatal hernia   . Lung nodules   . Glaucoma   . GERD (gastroesophageal reflux disease)     Past Surgical History:  Procedure Laterality Date  . ABDOMINAL HYSTERECTOMY    . CARDIAC  CATHETERIZATION N/A 01/09/2016   Procedure: Left Heart Cath and Coronary Angiography;  Surgeon: Marykay Lex, MD;  Location: [facility] INVASIVE CV LAB;  Service: Cardiovascular;  Laterality: N/A;  . CARDIAC CATHETERIZATION N/A 01/09/2016   Procedure: Coronary Stent Intervention;  Surgeon: Marykay Lex, MD;  Location: [facility] INVASIVE CV LAB;  Service: Cardiovascular;  Laterality: N/A;  . EYE SURGERY    . FRACTURE SURGERY     Tibia/fibula of right leg    OB History    No data available       Home Medications    Prior to Admission medications   Medication Sig Start Date End Date Taking? Authorizing Provider  ALPRAZolam (XANAX) 0.25 MG tablet Take 0.25 mg by mouth 2 (two) times daily as needed for anxiety.    Historical Provider, MD  aspirin EC 81 MG EC tablet Take 1 tablet (81 mg total) by mouth daily. 01/14/16   Arty Baumgartner, NP  atorvastatin (LIPITOR) 40 MG tablet Take 1 tablet (40 mg total) by mouth daily at 6 PM. 01/13/16   Arty Baumgartner, NP  bimatoprost (LUMIGAN) 0.01 % SOLN Place 1 drop into the left eye at bedtime.     Historical Provider, MD  calcium-vitamin D 250-100 MG-UNIT per tablet Take 1 tablet by mouth 2 (two) times daily.    Historical Provider, MD  celecoxib (CELEBREX) 200 MG capsule Take 200 mg by mouth daily.     Historical Provider, MD  ciprofloxacin (CIPRO) 500 MG tablet  Take 1 tablet (500 mg total) by mouth 2 (two) times daily. 03/04/16 03/09/16  Clanford Cyndie MullL Johnson, MD  citalopram (CELEXA) 20 MG tablet Take 20 mg by mouth every evening.     Historical Provider, MD  clopidogrel (PLAVIX) 75 MG tablet Take 1 tablet (75 mg total) by mouth daily. 01/14/16   Arty BaumgartnerLindsay B Roberts, NP  docusate sodium (COLACE) 100 MG capsule Take 100 mg by mouth 2 (two) times daily.     Historical Provider, MD  dorzolamide (TRUSOPT) 2 % ophthalmic solution Place 1 drop into both eyes 2 (two) times daily.    Historical Provider, MD  feeding supplement, ENSURE ENLIVE, (ENSURE ENLIVE) LIQD Take  237 mLs by mouth 2 (two) times daily between meals. 03/04/16   Clanford Cyndie MullL Johnson, MD  lansoprazole (PREVACID) 30 MG capsule Take 30 mg by mouth daily before breakfast.    Historical Provider, MD  lisinopril (PRINIVIL,ZESTRIL) 20 MG tablet Take 10 mg by mouth daily.    Historical Provider, MD  nitroGLYCERIN (NITROSTAT) 0.4 MG SL tablet Place 1 tablet (0.4 mg total) under the tongue every 5 (five) minutes as needed for chest pain. Patient not taking: Reported on 02/29/2016 01/13/16   Arty BaumgartnerLindsay B Roberts, NP  oxyCODONE (OXY IR/ROXICODONE) 5 MG immediate release tablet Take 5 mg by mouth 3 (three) times daily as needed for severe pain or breakthrough pain (pain).     Historical Provider, MD  oxymorphone (OPANA ER) 10 MG T12A 12 hr tablet Take 10 mg by mouth every 12 (twelve) hours.    Historical Provider, MD  Polyethyl Glycol-Propyl Glycol (SYSTANE ULTRA) 0.4-0.3 % SOLN Place 1 drop into both eyes 2 (two) times daily.    Historical Provider, MD  prednisoLONE acetate (PRED FORTE) 1 % ophthalmic suspension Place 1 drop into the right eye 2 (two) times daily.  01/08/15   Historical Provider, MD  senna (SENOKOT) 8.6 MG TABS tablet Take 2 tablets (17.2 mg total) by mouth at bedtime as needed for mild constipation. 03/04/16   Clanford Cyndie MullL Johnson, MD  Vitamin D, Cholecalciferol, 1000 UNITS TABS Take 1,000 Units by mouth daily.    Historical Provider, MD    Family History Family History  Problem Relation Age of Onset  . Cancer Brother     Stomach  . Heart disease Father     Died age 80  . Diabetes Mother   . Heart failure Mother     Died [id number]    Social History Social History  Substance Use Topics  . Smoking status: Former Games developermoker  . Smokeless tobacco: Never Used  . Alcohol use No     Allergies   Penicillins and Phenergan [promethazine hcl]   Review of Systems Review of Systems  Constitutional:       Per HPI, otherwise negative  HENT:       Per HPI, otherwise negative  Respiratory:        Per HPI, otherwise negative  Cardiovascular:       Per HPI, otherwise negative  Gastrointestinal: Positive for diarrhea, nausea and vomiting.  Endocrine:       Negative aside from HPI  Genitourinary:       Neg aside from HPI   Musculoskeletal:       Per HPI, otherwise negative  Skin: Negative.   Neurological: Positive for weakness. Negative for syncope.     Physical Exam Updated Vital Signs BP 134/76   Pulse 60   Temp 98.6 F (37 C) (Oral)   Resp 17  Ht 5' (1.524 m)   Wt 111 lb (50.3 kg)   SpO2 94%   BMI 21.68 kg/m   Physical Exam  Constitutional: She is oriented to person, place, and time. She has a sickly appearance. No distress.  HENT:  Head: Normocephalic and atraumatic.  Eyes: Conjunctivae and EOM are normal.  Cardiovascular: Normal rate and regular rhythm.   Pulmonary/Chest: Effort normal and breath sounds normal. No stridor. No respiratory distress.  Abdominal: She exhibits no distension.    Musculoskeletal: She exhibits no edema.  Neurological: She is alert and oriented to person, place, and time. She displays atrophy. No cranial nerve deficit.  Skin: Skin is warm and dry.  Psychiatric: She has a normal mood and affect.  Nursing note and vitals reviewed.   Chart review notable for recent hospitalization for diverticulitis, urinary tract infection. Patient is intolerant of oral Cipro.   ED Treatments / Results  Labs (all labs ordered are listed, but only abnormal results are displayed) Labs Reviewed  COMPREHENSIVE METABOLIC PANEL - Abnormal; Notable for the following:       Result Value   Glucose, Bld 155 (*)    Total Protein 5.9 (*)    Albumin 3.1 (*)    AST 96 (*)    ALT 72 (*)    All other components within normal limits  CBC - Abnormal; Notable for the following:    RBC 3.60 (*)    Hemoglobin 10.8 (*)    HCT 33.4 (*)    All other components within normal limits  URINALYSIS, ROUTINE W REFLEX MICROSCOPIC - Abnormal; Notable for the  following:    Leukocytes, UA LARGE (*)    Squamous Epithelial / LPF 0-5 (*)    All other components within normal limits  LIPASE, BLOOD    EKG  EKG Interpretation  Date/Time:  Saturday March 06 2016 12:00:20 EST Ventricular Rate:  67 PR Interval:  126 QRS Duration: 78 QT Interval:  420 QTC Calculation: 443 R Axis:   -2 Text Interpretation:  Normal sinus rhythm Nonspecific T wave abnormality No significant change since last tracing Abnormal ekg Confirmed by Gerhard Munch  MD [phone]) on 03/06/2016 1:34:01 PM       Radiology Dg Chest 2 View  Result Date: 03/06/2016 CLINICAL DATA:  Shortness of breath, diarrhea, weakness EXAM: CHEST  2 VIEW COMPARISON:  01/08/2016 FINDINGS: Chronic interstitial markings. No focal consolidation. No pleural effusion or pneumothorax. The heart is top-normal in size. Right shoulder arthroplasty. Degenerative changes of the left shoulder. IMPRESSION: No evidence of acute cardiopulmonary disease. Electronically Signed   By: Charline Bills M.D.   On: 03/06/2016 13:00    Procedures Procedures (including critical care time)  Medications Ordered in ED Medications  sodium chloride 0.9 % bolus 1,000 mL (not administered)  meropenem (MERREM) 500 mg in sodium chloride 0.9 % 50 mL IVPB (not administered)     Initial Impression / Assessment and Plan / ED Course  I have reviewed the triage vital signs and the nursing notes.  Pertinent labs & imaging results that were available during my care of the patient were reviewed by me and considered in my medical decision making (see chart for details).  Clinical Course     Initial labs notable for evidence for decreased hepatic function, malnutrition with low albumin.   On repeat exam the patient in similar condition. I reviewed initial findings with patient and family members. Subsequent, with concern for refractory UTI discussed her case with pharmacy. Recommendation for meropenem.  Given concern  for refractory urinary tract infection, weakness, patient received IV fluids, antibiotics, was admitted for further evaluation and management.   Final Clinical Impressions(s) / ED Diagnoses   Final diagnoses:  Urinary tract infection without hematuria, site unspecified  Weakness      Gerhard Munch, MD 03/06/16 1523

## 2016-03-06 NOTE — H&P (Signed)
History and Physical    Alexis Yates NOM:[id number] DOB: [birth year] DOA: 03/06/2016  PCP: Alexis Dubonnet, MD Patient coming from: home  Chief Complaint: generalized weakness, nausea vomiting diarrhea  HPI: Alexis Yates is a very pleasant 82 y.o. female with medical history significant for CAD, diverticulosis, hypertension, recent hospitalization for UTI discharged 2 days ago on Cipro presents to the emergency Department chief complaint generalized weakness nausea vomiting and 3 episodes of diarrhea. Initial evaluation reveals a urinary tract infection.  Information is obtained from the patient and the daughters who are at the bedside. Patient reports she was discharged 2 days ago on Cipro for treatment of urinary tract infection. She states when she went home she was short of breath with exertion felt "weak all over" unable to eat or drink. She experienced nausea without vomiting and reports 3-4 episodes of loose stool. She denies abdominal cramping with bowel movements but reports that her abdomen is "sore". She denies headache fever chills syncope or near-syncope. She denies chest pain palpitations lower extremity edema or orthopnea. She denies dysuria hematuria frequency or urgency. She denies bright red blood per rectum or melena.   ED Course: In the emergency department she's afebrile hemodynamically stable and not hypoxic. She is provided with IV fluids and meropenem pharmacy recommendation.  Review of Systems: As per HPI otherwise 10 point review of systems negative.   Ambulatory Status: Ordinarily patient ambulates independently drives short distances lives alone and is independent with ADLs.  Past Medical History:  Diagnosis Date  . Arthritis   . Atherosclerosis of aorta (HCC)   . CAD (coronary artery disease)    a. 10/27 PCI with DES to RCA, diffuse nonobstructive disease, EF 50-55%  . Chronic pain   . Diverticulosis   . Elevated transaminase level   . GERD  (gastroesophageal reflux disease)   . Glaucoma   . Hiatal hernia   . Hypertension   . Lung nodules    Right  . MI (myocardial infarction) 01/09/2016  . Scoliosis   . UTI (urinary tract infection)    refractory    Past Surgical History:  Procedure Laterality Date  . ABDOMINAL HYSTERECTOMY    . CARDIAC CATHETERIZATION N/A 01/09/2016   Procedure: Left Heart Cath and Coronary Angiography;  Surgeon: Alexis Lex, MD;  Location: [facility] INVASIVE CV LAB;  Service: Cardiovascular;  Laterality: N/A;  . CARDIAC CATHETERIZATION N/A 01/09/2016   Procedure: Coronary Stent Intervention;  Surgeon: Alexis Lex, MD;  Location: [facility] INVASIVE CV LAB;  Service: Cardiovascular;  Laterality: N/A;  . EYE SURGERY    . FRACTURE SURGERY     Tibia/fibula of right leg    Social History   Social History  . Marital status: Widowed    Spouse name: N/A  . Number of children: 2  . Years of education: N/A   Occupational History  . Retired from school system    Social History Main Topics  . Smoking status: Former Games developer  . Smokeless tobacco: Never Used  . Alcohol use No  . Drug use: No  . Sexual activity: Not on file   Other Topics Concern  . Not on file   Social History Narrative   Widowed.  Lives alone.  Ambulates with a cane.    Allergies  Allergen Reactions  . Penicillins Anaphylaxis    Has patient had a PCN reaction causing immediate rash, facial/tongue/throat swelling, SOB or lightheadedness with hypotension: Yes Has patient had a PCN reaction causing severe rash involving mucus  membranes or skin necrosis: No Has patient had a PCN reaction that required hospitalization Yes Has patient had a PCN reaction occurring within the last 10 years: No If all of the above answers are "NO", then may proceed with Cephalosporin use.   Marland Kitchen Phenergan [Promethazine Hcl] Other (See Comments)    Restless legs    Family History  Problem Relation Age of Onset  . Cancer Brother     Stomach  . Heart  disease Father     Died age 60  . Diabetes Mother   . Heart failure Mother     Died 42    Prior to Admission medications   Medication Sig Start Date End Date Taking? Authorizing Provider  ALPRAZolam (XANAX) 0.25 MG tablet Take 0.25 mg by mouth 2 (two) times daily as needed for anxiety.   Yes Historical Provider, MD  aspirin EC 81 MG EC tablet Take 1 tablet (81 mg total) by mouth daily. 01/14/16  Yes Alexis Baumgartner, NP  atorvastatin (LIPITOR) 40 MG tablet Take 1 tablet (40 mg total) by mouth daily at 6 PM. 01/13/16  Yes Alexis Baumgartner, NP  bimatoprost (LUMIGAN) 0.01 % SOLN Place 1 drop into the left eye at bedtime.    Yes Historical Provider, MD  calcium-vitamin D 250-100 MG-UNIT per tablet Take 1 tablet by mouth 2 (two) times daily.   Yes Historical Provider, MD  celecoxib (CELEBREX) 200 MG capsule Take 200 mg by mouth daily.    Yes Historical Provider, MD  ciprofloxacin (CIPRO) 500 MG tablet Take 1 tablet (500 mg total) by mouth 2 (two) times daily. 03/04/16 03/09/16 Yes Alexis Cyndie Mull, MD  citalopram (CELEXA) 20 MG tablet Take 20 mg by mouth every evening.    Yes Historical Provider, MD  clopidogrel (PLAVIX) 75 MG tablet Take 1 tablet (75 mg total) by mouth daily. 01/14/16  Yes Alexis Baumgartner, NP  docusate sodium (COLACE) 100 MG capsule Take 100 mg by mouth 2 (two) times daily.    Yes Historical Provider, MD  dorzolamide (TRUSOPT) 2 % ophthalmic solution Place 1 drop into both eyes 2 (two) times daily.   Yes Historical Provider, MD  feeding supplement, ENSURE ENLIVE, (ENSURE ENLIVE) LIQD Take 237 mLs by mouth 2 (two) times daily between meals. 03/04/16  Yes Alexis Cyndie Mull, MD  lansoprazole (PREVACID) 30 MG capsule Take 30 mg by mouth daily before breakfast.   Yes Historical Provider, MD  lisinopril (PRINIVIL,ZESTRIL) 20 MG tablet Take 10 mg by mouth daily.   Yes Historical Provider, MD  oxyCODONE (OXY IR/ROXICODONE) 5 MG immediate release tablet Take 5 mg by mouth 3 (three)  times daily as needed for severe pain or breakthrough pain (pain).    Yes Historical Provider, MD  oxymorphone (OPANA ER) 10 MG T12A 12 hr tablet Take 10 mg by mouth every 12 (twelve) hours.   Yes Historical Provider, MD  Polyethyl Glycol-Propyl Glycol (SYSTANE ULTRA) 0.4-0.3 % SOLN Place 1 drop into both eyes 2 (two) times daily.   Yes Historical Provider, MD  prednisoLONE acetate (PRED FORTE) 1 % ophthalmic suspension Place 1 drop into the right eye 2 (two) times daily.  01/08/15  Yes Historical Provider, MD  senna (SENOKOT) 8.6 MG TABS tablet Take 2 tablets (17.2 mg total) by mouth at bedtime as needed for mild constipation. 03/04/16  Yes Alexis Cyndie Mull, MD  Vitamin D, Cholecalciferol, 1000 UNITS TABS Take 1,000 Units by mouth daily.   Yes Historical Provider, MD    Physical  Exam: Vitals:   03/06/16 1530 03/06/16 1532 03/06/16 1545 03/06/16 1615  BP: 149/80 149/80 164/76 161/72  Pulse: 73 66 70 70  Resp: 18 16 19 16   Temp:      TempSrc:      SpO2: 98% 99% 97% 97%  Weight:      Height:         General:  Appears Somewhat frail in no acute distress Eyes:  PERRL, EOMI, normal lids, iris ENT:  grossly normal hearing, lips & tongue, mucous membranes of her mouth are pink and moist Neck:  no LAD, masses or thyromegaly Cardiovascular:  RRR, no m/r/g. No LE edema.  Respiratory:  CTA bilaterally, no w/r/r. Normal respiratory effort. Abdomen:  soft, nondistended very sluggish bowel sounds diffuse tenderness to mild palpation no guarding or rebounding Skin:  no rash or induration seen on limited exam Musculoskeletal:  Poor muscle tone BUE/BLE, poor ROM particularly to her left knee and bilateral shoulders, no bony abnormality joints without swelling/erythema Psychiatric:  grossly normal mood and affect, speech fluent and appropriate, AOx3 Neurologic:  CN 2-12 grossly intact, moves all extremities in coordinated fashion, sensation intact speech clear facial symmetry bilateral grip 5 out of  5  Labs on Admission: I have personally reviewed following labs and imaging studies  CBC:  Recent Labs Lab 02/28/16 2124 03/06/16 1158  WBC 11.1* 6.2  HGB 11.4* 10.8*  HCT 35.3* 33.4*  MCV 92.7 92.8  PLT 276 264   Basic Metabolic Panel:  Recent Labs Lab 02/28/16 2124 03/01/16 0343 03/06/16 1158  NA 137 136 140  K 4.3 4.0 3.8  CL 103 102 108  CO2 26 27 25   GLUCOSE 120* 87 155*  BUN 19 10 7   CREATININE 0.89 0.94 0.84  CALCIUM 9.4 8.9 9.0   GFR: Estimated Creatinine Clearance: 34.5 mL/min (by C-G formula based on SCr of 0.84 mg/dL). Liver Function Tests:  Recent Labs Lab 02/28/16 2124 03/06/16 1158  AST 57* 96*  ALT 66* 72*  ALKPHOS 76 89  BILITOT 0.4 0.4  PROT 6.6 5.9*  ALBUMIN 3.6 3.1*    Recent Labs Lab 02/28/16 2124 03/06/16 1158  LIPASE 33 18   No results for input(s): AMMONIA in the last 168 hours. Coagulation Profile: No results for input(s): INR, PROTIME in the last 168 hours. Cardiac Enzymes: No results for input(s): CKTOTAL, CKMB, CKMBINDEX, TROPONINI in the last 168 hours. BNP (last 3 results) No results for input(s): PROBNP in the last 8760 hours. HbA1C: No results for input(s): HGBA1C in the last 72 hours. CBG: No results for input(s): GLUCAP in the last 168 hours. Lipid Profile: No results for input(s): CHOL, HDL, LDLCALC, TRIG, CHOLHDL, LDLDIRECT in the last 72 hours. Thyroid Function Tests: No results for input(s): TSH, T4TOTAL, FREET4, T3FREE, THYROIDAB in the last 72 hours. Anemia Panel: No results for input(s): VITAMINB12, FOLATE, FERRITIN, TIBC, IRON, RETICCTPCT in the last 72 hours. Urine analysis:    Component Value Date/Time   COLORURINE YELLOW 03/06/2016 1353   APPEARANCEUR CLEAR 03/06/2016 1353   LABSPEC 1.013 03/06/2016 1353   PHURINE 7.0 03/06/2016 1353   GLUCOSEU NEGATIVE 03/06/2016 1353   HGBUR NEGATIVE 03/06/2016 1353   BILIRUBINUR NEGATIVE 03/06/2016 1353   KETONESUR NEGATIVE 03/06/2016 1353   PROTEINUR  NEGATIVE 03/06/2016 1353   UROBILINOGEN 0.2 05/27/2014 1122   NITRITE NEGATIVE 03/06/2016 1353   LEUKOCYTESUR LARGE (A) 03/06/2016 1353    Creatinine Clearance: Estimated Creatinine Clearance: 34.5 mL/min (by C-G formula based on SCr of 0.84 mg/dL).  Sepsis Labs: @LABRCNTIP (procalcitonin:4,lacticidven:4) ) Recent Results (from the past 240 hour(s))  Urine culture     Status: Abnormal   Collection Time: 02/28/16  9:25 PM  Result Value Ref Range Status   Specimen Description URINE, RANDOM  Final   Special Requests NONE  Final   Culture <10,000 COLONIES/mL INSIGNIFICANT GROWTH (A)  Final   Report Status 03/01/2016 FINAL  Final     Radiological Exams on Admission: Dg Chest 2 View  Result Date: 03/06/2016 CLINICAL DATA:  Shortness of breath, diarrhea, weakness EXAM: CHEST  2 VIEW COMPARISON:  01/08/2016 FINDINGS: Chronic interstitial markings. No focal consolidation. No pleural effusion or pneumothorax. The heart is top-normal in size. Right shoulder arthroplasty. Degenerative changes of the left shoulder. IMPRESSION: No evidence of acute cardiopulmonary disease. Electronically Signed   By: Charline BillsSriyesh  Krishnan M.D.   On: 03/06/2016 13:00    EKG: Independently reviewed. Normal sinus rhythm Nonspecific T wave abnormality No significant change since last tracing Abnormal ekg  Assessment/Plan Active Problems:   UTI (urinary tract infection)   Hypertension   GERD (gastroesophageal reflux disease)   Nausea and vomiting   Anemia   Diarrhea   Generalized weakness   Elevated transaminase level   Chronic pain   #1. Urinary tract infection. Refractory.  Patient just discharged 2 days ago on Cipro. Stop taking as developed nausea vomiting and diarrhea. ED provider discussed case with pharmacy who recommended merrem given her history of sent diverticulitis -Admit -Continue Merrem -Follow urine culture -IV fluids -Monitor urine output  #2. Nausea vomiting and diarrhea. May be related to  Cipro and UTI in the setting of recent diverticulitis. Nausea and vomiting improving. Last episode of diarrhea last night. -Clear liquid diet -Continue home pain regimen -Zofran for nausea -GI panel  -Advance diet as able -Consider avoiding Cipro in the future  #3. Generalized weakness. Likely related to recent acute illness and overall deconditioning. Longer sees immobile the week or she gets. -Encourage mobilization -Encourage oral nutrition -Physical therapy evaluation -Benefit from couple weeks of rehabilitation  #4. Hypertension. Her pressure is on the high end of normal. Home medications include Lisinopril -Continue lisinopril -Monitor closely  #5. Elevated transaminase. No History liver disease. AST and ALT trending up. Alkaline phosphatase and total bilirubin within the limits of normal. Likely related to acute illness -Monitor  #6. Anemia. Likely of chronic disease. Hemoglobin 10.8 on admission. Chart review indicates this is baseline. no Signs symptoms of bleeding -Outpatient follow-up  #7. Chronic pain related to arthritis status post shoulder replacement. Pain mostly bilateral knees bilateral shoulders. She reports orthopedics informed her she is not a candidate for any further joint replacements. Home medications include Celebrex, oxycodone,oxymorphone. -Continue home meds -Physical therapy evaluation -Of note physical therapy evaluated 2 days ago recommended home health   DVT prophylaxis: plavix Code Status:  full Family Communication: daughters at bedside  Disposition Plan: would benefit from short term snf Consults called:  none Admission status: inpatient    Gwenyth BenderBLACK,KAREN M MD [facility]  If 7PM-7AM, please contact night-coverage [email or web address] Password [email or web address]  03/06/2016, 4:34 PM

## 2016-03-06 NOTE — ED Triage Notes (Signed)
Pt states she has SOB and N?V. Pt was recently hospitalized and in on  antibx.

## 2016-03-07 DIAGNOSIS — G8929 Other chronic pain: Secondary | ICD-10-CM | POA: Diagnosis not present

## 2016-03-07 DIAGNOSIS — N390 Urinary tract infection, site not specified: Secondary | ICD-10-CM

## 2016-03-07 DIAGNOSIS — R740 Nonspecific elevation of levels of transaminase and lactic acid dehydrogenase [LDH]: Secondary | ICD-10-CM

## 2016-03-07 DIAGNOSIS — A09 Infectious gastroenteritis and colitis, unspecified: Secondary | ICD-10-CM | POA: Diagnosis not present

## 2016-03-07 DIAGNOSIS — R1012 Left upper quadrant pain: Secondary | ICD-10-CM

## 2016-03-07 DIAGNOSIS — K5732 Diverticulitis of large intestine without perforation or abscess without bleeding: Secondary | ICD-10-CM | POA: Diagnosis not present

## 2016-03-07 DIAGNOSIS — R531 Weakness: Secondary | ICD-10-CM | POA: Diagnosis not present

## 2016-03-07 DIAGNOSIS — R112 Nausea with vomiting, unspecified: Secondary | ICD-10-CM

## 2016-03-07 DIAGNOSIS — R109 Unspecified abdominal pain: Secondary | ICD-10-CM

## 2016-03-07 DIAGNOSIS — K219 Gastro-esophageal reflux disease without esophagitis: Secondary | ICD-10-CM

## 2016-03-07 DIAGNOSIS — I10 Essential (primary) hypertension: Secondary | ICD-10-CM

## 2016-03-07 LAB — BASIC METABOLIC PANEL
ANION GAP: 5 (ref 5–15)
BUN: 5 mg/dL — AB (ref 6–20)
CHLORIDE: 112 mmol/L — AB (ref 101–111)
CO2: 23 mmol/L (ref 22–32)
Calcium: 8.3 mg/dL — ABNORMAL LOW (ref 8.9–10.3)
Creatinine, Ser: 0.73 mg/dL (ref 0.44–1.00)
GFR calc Af Amer: >60 mL/min (ref >60)
Glucose, Bld: 81 mg/dL (ref 65–99)
POTASSIUM: 3.8 mmol/L (ref 3.5–5.1)
SODIUM: 140 mmol/L (ref 135–145)

## 2016-03-07 LAB — GASTROINTESTINAL PANEL BY PCR, STOOL (REPLACES STOOL CULTURE)

## 2016-03-07 LAB — COMPREHENSIVE METABOLIC PANEL
ALBUMIN: 2.4 g/dL — AB (ref 3.5–5.0)
ALT: 50 U/L (ref 14–54)
ANION GAP: 3 — AB (ref 5–15)
AST: 54 U/L — ABNORMAL HIGH (ref 15–41)
Alkaline Phosphatase: 69 U/L (ref 38–126)
BILIRUBIN TOTAL: 0.3 mg/dL (ref 0.3–1.2)
BUN: 7 mg/dL (ref 6–20)
CO2: 24 mmol/L (ref 22–32)
Calcium: 8.1 mg/dL — ABNORMAL LOW (ref 8.9–10.3)
Chloride: 111 mmol/L (ref 101–111)
Creatinine, Ser: 0.8 mg/dL (ref 0.44–1.00)
GFR calc Af Amer: >60 mL/min (ref >60)
Glucose, Bld: 101 mg/dL — ABNORMAL HIGH (ref 65–99)
POTASSIUM: 4 mmol/L (ref 3.5–5.1)
Sodium: 138 mmol/L (ref 135–145)
TOTAL PROTEIN: 4.8 g/dL — AB (ref 6.5–8.1)

## 2016-03-07 LAB — CBC
HCT: 28.6 % — ABNORMAL LOW (ref 36.0–46.0)
HEMOGLOBIN: 9.2 g/dL — AB (ref 12.0–15.0)
MCH: 29.9 pg (ref 26.0–34.0)
MCHC: 32.2 g/dL (ref 30.0–36.0)
MCV: 92.9 fL (ref 78.0–100.0)
Platelets: 259 10*3/uL (ref 150–400)
RBC: 3.08 MIL/uL — ABNORMAL LOW (ref 3.87–5.11)
RDW: 14.5 % (ref 11.5–15.5)
WBC: 7.5 10*3/uL (ref 4.0–10.5)

## 2016-03-07 LAB — GLUCOSE, CAPILLARY
GLUCOSE-CAPILLARY: 77 mg/dL (ref 65–99)
Glucose-Capillary: 112 mg/dL — ABNORMAL HIGH (ref 65–99)

## 2016-03-07 MED ORDER — ONDANSETRON HCL 4 MG/2ML IJ SOLN
4.0000 mg | Freq: Four times a day (QID) | INTRAMUSCULAR | Status: DC
  Administered 2016-03-07 – 2016-03-09 (×8): 4 mg via INTRAVENOUS
  Filled 2016-03-07 (×7): qty 2

## 2016-03-07 MED ORDER — LEVOFLOXACIN 750 MG PO TABS
750.0000 mg | ORAL_TABLET | ORAL | Status: DC
  Administered 2016-03-07 – 2016-03-09 (×2): 750 mg via ORAL
  Filled 2016-03-07 (×3): qty 1

## 2016-03-07 MED ORDER — METRONIDAZOLE 500 MG PO TABS
500.0000 mg | ORAL_TABLET | Freq: Three times a day (TID) | ORAL | Status: DC
  Administered 2016-03-07 – 2016-03-09 (×8): 500 mg via ORAL
  Filled 2016-03-07 (×8): qty 1

## 2016-03-07 NOTE — Evaluation (Addendum)
Physical Therapy Evaluation Patient Details Name: Alexis Yates MRN: [id number] DOB: 03/20/1927 Today's Date: 03/07/2016   History of Present Illness  Pt is an 82 y.o. female with medical history significant of CAD, diverticulosis, HTN.   Readmitted after recent Dc due to weakness and diarrhea.  Clinical Impression  Pt admitted with above diagnosis. Pt currently with functional limitations due to the deficits listed below (see PT Problem List).  Pt will benefit from skilled PT to increase their independence and safety.  Pt feels very weak and would like to go to SNF for rehab as she does not feel like she can care for herself at home.  Current medical condition and mobility status evolving.         Follow Up Recommendations SNF;Supervision for mobility/OOB    Equipment Recommendations  None recommended by PT    Recommendations for Other Services       Precautions / Restrictions Precautions Precautions: Fall Precaution Comments: Pt returned to bed as no chair alarm available      Mobility  Bed Mobility Overal bed mobility: Modified Independent             General bed mobility comments: increased time  Transfers Overall transfer level: Needs assistance   Transfers: Sit to/from Stand Sit to Stand: Min assist            Ambulation/Gait Ambulation/Gait assistance: Min assist Ambulation Distance (Feet): 20 Feet Assistive device: Straight cane Gait Pattern/deviations: Step-to pattern;Decreased stride length Gait velocity: decreased Gait velocity interpretation: Below normal speed for age/gender General Gait Details: shuffling gait  Stairs            Wheelchair Mobility    Modified Rankin (Stroke Patients Only)       Balance                                             Pertinent Vitals/Pain Pain Assessment: 0-10 Pain Score: 6  Pain Location: Right knee  Pain Descriptors / Indicators: Other (Comment) ("hurts like hell when I  walk") Pain Intervention(s): Limited activity within patient's tolerance;Monitored during session    Home Living Family/patient expects to be discharged to:: Skilled nursing facility                      Prior Function Level of Independence: Independent with assistive device(s)         Comments: Pt reported that she ambulates with use of SPC. Pt has a housekeeper that does the grocery shopping, laundry, and cleans     Hand Dominance        Extremity/Trunk Assessment   Upper Extremity Assessment Upper Extremity Assessment: Defer to OT evaluation    Lower Extremity Assessment Lower Extremity Assessment: Generalized weakness    Cervical / Trunk Assessment Cervical / Trunk Assessment: Kyphotic  Communication   Communication: No difficulties  Cognition Arousal/Alertness: Awake/alert Behavior During Therapy: WFL for tasks assessed/performed Overall Cognitive Status: Within Functional Limits for tasks assessed                      General Comments General comments (skin integrity, edema, etc.): MD came in to see pt during session    Exercises     Assessment/Plan    PT Assessment Patient needs continued PT services  PT Problem List Decreased strength;Decreased activity tolerance;Decreased balance;Decreased mobility;Pain  PT Treatment Interventions DME instruction;Gait training;Functional mobility training;Therapeutic activities;Therapeutic exercise;Balance training;Patient/family education    PT Goals (Current goals can be found in the Care Plan section)  Acute Rehab PT Goals Patient Stated Goal: wants to go to SNF as she feels unable to care for herself PT Goal Formulation: With patient Time For Goal Achievement: 03/14/16 Potential to Achieve Goals: Good    Frequency Min 3X/week   Barriers to discharge Decreased caregiver support      Co-evaluation               End of Session Equipment Utilized During Treatment: Gait  belt Activity Tolerance: Patient tolerated treatment well Patient left: in bed;with bed alarm set;with call bell/phone within reach           Time: 0836-0910 PT Time Calculation (min) (ACUTE ONLY): 34 min   Charges:   PT Evaluation $PT Eval Moderate Complexity: 1 Procedure PT Treatments $Gait Training: 8-22 mins   PT G Codes:        Donnella ShamSawulski, Perley Arthurs J 03/07/2016, 9:21 AM  Lavona MoundMark Milady Fleener, PT  [phone] 03/07/2016

## 2016-03-07 NOTE — Progress Notes (Signed)
PROGRESS NOTE    Alexis Yates  EAV:[id number] DOB: [birth year] DOA: 03/06/2016  PCP: Pearla DubonnetGATES,ROBERT NEVILL, MD   Brief Narrative:  This is an 82 year old female with a history of CAD, diverticulosis, hypertension, who was in the hospital from 1217 through 12/21. She presented at that time with lower abdominal pain for 1-2 days which had not improved despite Cipro that she had taken as outpatient for 5 days. At that time, UA was found to have moderate leukocytosis and CT scan was equivocal for diverticulitis. She was treated initially with Bactrim and Flagyl due to a penicillin allergy.  Patient also felt that Flagyl was causing her to vomit and therefore it was switched to IV Flagyl. She was eventually discharged on oral Cipro again for 5 more days. The patient returns 2 days later with nausea vomiting, loose stools and abdominal pain.  Subjective: Vomiting and loose stools have resolved. Continues to have abdominal pain but can't tell me the side of this pain.  Assessment & Plan:   Principal Problem:   Nausea and vomiting/   Abdominal pain - Clinical picture is a bit confusing as she has been on and off for a number of antibiotics and we are not certain if her symptoms are from a UTI versus diverticulitis-CT on 12/17 showed extensive sigmoid and descending colonic diverticulosis with muscular hypertrophy and pericolonic scarring and therefore diverticulitis was not entirely excluded. - UA is still positive today and she is tender in epigastrium and left upper quadrant and therefore could potentially also have a diverticulitis -Started on Merrem however the patient is hesitant to receive PICC line for minimum of 7 days of treatment due to risks of a PICC line which are understandable -Allergic to penicillin which causes anaphylaxis -We'll try Levaquin and Flagyl orally and Zofran routine every 6 hours to see if we can control some of GI her side effects - Follow up on urine culture -GI panel  negative  Active Problems:   UTI (urinary tract infection) severe bladder prolapse managed with pessary -As above   Elevated transaminase level -Mildly elevated AST and ALT with normal total bili and lipase and alkaline phosphatase -CT on 12/17 did not reveal any liver or gallbladder abnormalities  LV outflow obstruction -Followed by cardiology-medical management-asymptomatic  CAD-MI in 10/17 -Cardiac cath revealed 100% stenosed RCA, EES stent was placed -Continue aspirin and Plavix    Hypertension - Lisinopril    Anemia - Likely anemia of chronic disease-follow    Generalized weakness -Her PT eval, she needs to go to SNF     Chronic pain    DVT prophylaxis: SCDs-also on aspirin and Plavix Code Status: Full code Family Communication:  Disposition Plan:  SNF Consultants:    Procedures:    Antimicrobials:  Anti-infectives    Start     Dose/Rate Route Frequency Ordered Stop   03/07/16 0915  levofloxacin (LEVAQUIN) tablet 750 mg     750 mg Oral Every 48 hours 03/07/16 0858     03/07/16 0900  metroNIDAZOLE (FLAGYL) tablet 500 mg     500 mg Oral Every 8 hours 03/07/16 0858     03/07/16 0500  meropenem (MERREM) 1 g in sodium chloride 0.9 % 100 mL IVPB  Status:  Discontinued     1 g 200 mL/hr over 30 Minutes Intravenous Every 12 hours 03/06/16 1901 03/07/16 0858   03/06/16 1515  meropenem (MERREM) 500 mg in sodium chloride 0.9 % 50 mL IVPB     500 mg 100  mL/hr over 30 Minutes Intravenous  Once 03/06/16 1509 03/06/16 1718       Objective: Vitals:   03/06/16 1838 03/06/16 2142 03/07/16 0226 03/07/16 0538  BP: (!) 148/77 (!) 148/73 124/61 132/67  Pulse: 80 78 71 66  Resp:  17 17 16   Temp: 98 F (36.7 C) 98.1 F (36.7 C) 97.3 F (36.3 C) 97.5 F (36.4 C)  TempSrc: Oral Oral Oral Oral  SpO2: 98% 96% 95% 95%  Weight:      Height:        Intake/Output Summary (Last 24 hours) at 03/07/16 1629 Last data filed at 03/07/16 0541  Gross per 24 hour  Intake              1110 ml  Output                0 ml  Net             1110 ml   Filed Weights   03/06/16 1152  Weight: 50.3 kg (111 lb)    Examination: General exam: Appears comfortable  HEENT: PERRLA, oral mucosa moist, no sclera icterus or thrush Respiratory system: Clear to auscultation. Respiratory effort normal. Cardiovascular system: S1 & S2 heard, RRR.  No murmurs  Gastrointestinal system: Abdomen soft, tender in epigastrium and left upper quadrant-milder tenderness in left lower quadrant, nondistended. Normal bowel sound. No organomegaly Central nervous system: Alert and oriented. No focal neurological deficits. Extremities: No cyanosis, clubbing or edema Skin: No rashes or ulcers Psychiatry:  Mood & affect appropriate.     Data Reviewed: I have personally reviewed following labs and imaging studies  CBC:  Recent Labs Lab 03/06/16 1158 03/07/16 0601  WBC 6.2 7.5  HGB 10.8* 9.2*  HCT 33.4* 28.6*  MCV 92.8 92.9  PLT 264 259   Basic Metabolic Panel:  Recent Labs Lab 03/01/16 0343 03/06/16 1158 03/07/16 0601  NA 136 140 140  K 4.0 3.8 3.8  CL 102 108 112*  CO2 27 25 23   GLUCOSE 87 155* 81  BUN 10 7 5*  CREATININE 0.94 0.84 0.73  CALCIUM 8.9 9.0 8.3*   GFR: Estimated Creatinine Clearance: 36.3 mL/min (by C-G formula based on SCr of 0.73 mg/dL). Liver Function Tests:  Recent Labs Lab 03/06/16 1158  AST 96*  ALT 72*  ALKPHOS 89  BILITOT 0.4  PROT 5.9*  ALBUMIN 3.1*    Recent Labs Lab 03/06/16 1158  LIPASE 18   No results for input(s): AMMONIA in the last 168 hours. Coagulation Profile: No results for input(s): INR, PROTIME in the last 168 hours. Cardiac Enzymes: No results for input(s): CKTOTAL, CKMB, CKMBINDEX, TROPONINI in the last 168 hours. BNP (last 3 results) No results for input(s): PROBNP in the last 8760 hours. HbA1C: No results for input(s): HGBA1C in the last 72 hours. CBG:  Recent Labs Lab 03/06/16 1902 03/06/16 2146  03/07/16 0805 03/07/16 1214  GLUCAP 108* 108* 77 112*   Lipid Profile: No results for input(s): CHOL, HDL, LDLCALC, TRIG, CHOLHDL, LDLDIRECT in the last 72 hours. Thyroid Function Tests: No results for input(s): TSH, T4TOTAL, FREET4, T3FREE, THYROIDAB in the last 72 hours. Anemia Panel: No results for input(s): VITAMINB12, FOLATE, FERRITIN, TIBC, IRON, RETICCTPCT in the last 72 hours. Urine analysis:    Component Value Date/Time   COLORURINE YELLOW 03/06/2016 1353   APPEARANCEUR CLEAR 03/06/2016 1353   LABSPEC 1.013 03/06/2016 1353   PHURINE 7.0 03/06/2016 1353   GLUCOSEU NEGATIVE 03/06/2016 1353   HGBUR  NEGATIVE 03/06/2016 1353   BILIRUBINUR NEGATIVE 03/06/2016 1353   KETONESUR NEGATIVE 03/06/2016 1353   PROTEINUR NEGATIVE 03/06/2016 1353   UROBILINOGEN 0.2 05/27/2014 1122   NITRITE NEGATIVE 03/06/2016 1353   LEUKOCYTESUR LARGE (A) 03/06/2016 1353   Sepsis Labs: @LABRCNTIP (procalcitonin:4,lacticidven:4) ) Recent Results (from the past 240 hour(s))  Urine culture     Status: Abnormal   Collection Time: 02/28/16  9:25 PM  Result Value Ref Range Status   Specimen Description URINE, RANDOM  Final   Special Requests NONE  Final   Culture <10,000 COLONIES/mL INSIGNIFICANT GROWTH (A)  Final   Report Status 03/01/2016 FINAL  Final  Gastrointestinal Panel by PCR , Stool     Status: None   Collection Time: 03/06/16  4:33 PM  Result Value Ref Range Status   Campylobacter species NOT DETECTED NOT DETECTED Final   Plesimonas shigelloides NOT DETECTED NOT DETECTED Final   Salmonella species NOT DETECTED NOT DETECTED Final   Yersinia enterocolitica NOT DETECTED NOT DETECTED Final   Vibrio species NOT DETECTED NOT DETECTED Final   Vibrio cholerae NOT DETECTED NOT DETECTED Final   Enteroaggregative E coli (EAEC) NOT DETECTED NOT DETECTED Final   Enteropathogenic E coli (EPEC) NOT DETECTED NOT DETECTED Final   Enterotoxigenic E coli (ETEC) NOT DETECTED NOT DETECTED Final   Shiga like  toxin producing E coli (STEC) NOT DETECTED NOT DETECTED Final   Shigella/Enteroinvasive E coli (EIEC) NOT DETECTED NOT DETECTED Final   Cryptosporidium NOT DETECTED NOT DETECTED Final   Cyclospora cayetanensis NOT DETECTED NOT DETECTED Final   Entamoeba histolytica NOT DETECTED NOT DETECTED Final   Giardia lamblia NOT DETECTED NOT DETECTED Final   Adenovirus F40/41 NOT DETECTED NOT DETECTED Final   Astrovirus NOT DETECTED NOT DETECTED Final   Norovirus GI/GII NOT DETECTED NOT DETECTED Final   Rotavirus A NOT DETECTED NOT DETECTED Final   Sapovirus (I, II, IV, and V) NOT DETECTED NOT DETECTED Final         Radiology Studies: Dg Chest 2 View  Result Date: 03/06/2016 CLINICAL DATA:  Shortness of breath, diarrhea, weakness EXAM: CHEST  2 VIEW COMPARISON:  01/08/2016 FINDINGS: Chronic interstitial markings. No focal consolidation. No pleural effusion or pneumothorax. The heart is top-normal in size. Right shoulder arthroplasty. Degenerative changes of the left shoulder. IMPRESSION: No evidence of acute cardiopulmonary disease. Electronically Signed   By: Charline Bills M.D.   On: 03/06/2016 13:00      Scheduled Meds: . aspirin EC  81 mg Oral Daily  . atorvastatin  40 mg Oral q1800  . celecoxib  200 mg Oral Daily  . cholecalciferol  1,000 Units Oral Daily  . citalopram  20 mg Oral QPM  . clopidogrel  75 mg Oral Daily  . docusate sodium  100 mg Oral BID  . dorzolamide  1 drop Both Eyes BID  . feeding supplement (ENSURE ENLIVE)  237 mL Oral BID BM  . hydroxypropyl methylcellulose / hypromellose  1 drop Both Eyes BID  . insulin aspart  0-9 Units Subcutaneous TID WC  . latanoprost  1 drop Left Eye QHS  . levofloxacin  750 mg Oral Q48H  . lisinopril  10 mg Oral Daily  . metroNIDAZOLE  500 mg Oral Q8H  . morphine  30 mg Oral Q12H  . ondansetron (ZOFRAN) IV  4 mg Intravenous Q6H  . pantoprazole  20 mg Oral Daily  . prednisoLONE acetate  1 drop Right Eye BID   Continuous  Infusions:   LOS: 1 day  Time spent in minutes: 35    Lasaundra Riche, MD [facility] Pager: [email or web address] Password [email or web address] 03/07/2016, 4:29 PM

## 2016-03-08 DIAGNOSIS — K5732 Diverticulitis of large intestine without perforation or abscess without bleeding: Secondary | ICD-10-CM | POA: Diagnosis not present

## 2016-03-08 DIAGNOSIS — G8929 Other chronic pain: Secondary | ICD-10-CM

## 2016-03-08 DIAGNOSIS — R531 Weakness: Secondary | ICD-10-CM | POA: Diagnosis not present

## 2016-03-08 DIAGNOSIS — N390 Urinary tract infection, site not specified: Secondary | ICD-10-CM | POA: Diagnosis not present

## 2016-03-08 DIAGNOSIS — R1012 Left upper quadrant pain: Secondary | ICD-10-CM | POA: Diagnosis not present

## 2016-03-08 DIAGNOSIS — R112 Nausea with vomiting, unspecified: Secondary | ICD-10-CM | POA: Diagnosis not present

## 2016-03-08 LAB — CBC
HEMATOCRIT: 28.9 % — AB (ref 36.0–46.0)
Hemoglobin: 9.1 g/dL — ABNORMAL LOW (ref 12.0–15.0)
MCH: 29.8 pg (ref 26.0–34.0)
MCHC: 31.5 g/dL (ref 30.0–36.0)
MCV: 94.8 fL (ref 78.0–100.0)
Platelets: 232 10*3/uL (ref 150–400)
RBC: 3.05 MIL/uL — ABNORMAL LOW (ref 3.87–5.11)
RDW: 14.9 % (ref 11.5–15.5)
WBC: 6.9 10*3/uL (ref 4.0–10.5)

## 2016-03-08 LAB — URINE CULTURE: Culture: NO GROWTH

## 2016-03-08 MED ORDER — HYDROCORTISONE 1 % EX OINT
TOPICAL_OINTMENT | CUTANEOUS | Status: DC | PRN
  Administered 2016-03-08: 22:00:00 via TOPICAL
  Filled 2016-03-08: qty 28.35

## 2016-03-08 NOTE — Progress Notes (Signed)
PROGRESS NOTE    Alexis Yates  ZOX:[id number] DOB: 07-07-1927 DOA: 03/06/2016  PCP: Pearla Dubonnet, MD   Brief Narrative:  This is an 82 year old female with a history of CAD, diverticulosis, hypertension, who was in the hospital from 1217 through 12/21. She presented at that time with lower abdominal pain for 1-2 days which had not improved despite Cipro that she had taken as outpatient for 5 days. At that time, UA was found to have moderate leukocytosis and CT scan was equivocal for diverticulitis. She was treated initially with Bactrim and Flagyl due to a penicillin allergy.  Patient also felt that Flagyl was causing her to vomit and therefore it was switched to IV Flagyl. She was eventually discharged on oral Cipro again for 5 more days. The patient returns 2 days later with nausea vomiting, loose stools and abdominal pain.  Subjective: No vomiting or diarrhea. Abdominal pain somewhat better.   Assessment & Plan:   Principal Problem:   Nausea and vomiting/   Abdominal pain - Clinical picture is a bit confusing as she has been on and off for a number of antibiotics and we are not certain if her symptoms are from a UTI versus diverticulitis-CT on 12/17 showed extensive sigmoid and descending colonic diverticulosis with muscular hypertrophy and pericolonic scarring and therefore diverticulitis was not entirely excluded. - UA positive  -  tender in left upper and lower quadrants and therefore could potentially also have a diverticulitis -Started on Merrem as she stated oral Cipro, Levaquin and Flagyl cause GI side effects such as nausea, vomiting and diarrhea - however the patient is hesitant to receive PICC line for minimum of 7 days of treatment with Merrem due to risks of a PICC line which are understandable -Allergic to penicillin which causes anaphylaxis - 12/24- trial of Levaquin and Flagyl orally and Zofran routine every 6 hours to see if we can control some of GI her side  effects- tolerating well  - urine culture negative -GI panel negative  Active Problems:   UTI (urinary tract infection) severe bladder prolapse managed with pessary -As above- culture negative- noted that every UA she has had in the hospital (4 episodes since 2016) has been positive and wonder if she has a chronically positive UA due to her anatomy    Elevated transaminase level -Mildly elevated AST and ALT with normal total bili and lipase and alkaline phosphatase - improving- due to infection?? -CT on 12/17 did not reveal any liver or gallbladder abnormalities  LV outflow obstruction -Followed by cardiology-medical management-asymptomatic - follow closely for hypotension which may cause neurological symptoms  CAD-MI in 10/17 -Cardiac cath revealed 100% stenosed RCA, DES stent was placed -Continue aspirin and Plavix    Hypertension - Lisinopril    Anemia - Likely anemia of chronic disease-follow    Generalized weakness -Her PT eval, she needs to go to SNF     Chronic pain    DVT prophylaxis: SCDs-also on aspirin and Plavix Code Status: Full code Family Communication:  Disposition Plan:  SNF Consultants:    Procedures:    Antimicrobials:  Anti-infectives    Start     Dose/Rate Route Frequency Ordered Stop   03/07/16 0915  levofloxacin (LEVAQUIN) tablet 750 mg     750 mg Oral Every 48 hours 03/07/16 0858     03/07/16 0900  metroNIDAZOLE (FLAGYL) tablet 500 mg     500 mg Oral Every 8 hours 03/07/16 0858     03/07/16 0500  meropenem (  MERREM) 1 g in sodium chloride 0.9 % 100 mL IVPB  Status:  Discontinued     1 g 200 mL/hr over 30 Minutes Intravenous Every 12 hours 03/06/16 1901 03/07/16 0858   03/06/16 1515  meropenem (MERREM) 500 mg in sodium chloride 0.9 % 50 mL IVPB     500 mg 100 mL/hr over 30 Minutes Intravenous  Once 03/06/16 1509 03/06/16 1718       Objective: Vitals:   03/07/16 0538 03/07/16 2114 03/08/16 0623 03/08/16 1448  BP: 132/67 (!) 141/59  128/66 123/80  Pulse: 66 67 60 95  Resp: 16 17 16 16   Temp: 97.5 F (36.4 C) 97.8 F (36.6 C) 97.7 F (36.5 C) 97.7 F (36.5 C)  TempSrc: Oral Oral Oral Oral  SpO2: 95% 96% 93% 95%  Weight:      Height:        Intake/Output Summary (Last 24 hours) at 03/08/16 1451 Last data filed at 03/08/16 0940  Gross per 24 hour  Intake              370 ml  Output              450 ml  Net              -80 ml   Filed Weights   03/06/16 1152  Weight: 50.3 kg (111 lb)    Examination: General exam: Appears comfortable  HEENT: PERRLA, oral mucosa moist, no sclera icterus or thrush Respiratory system: Clear to auscultation. Respiratory effort normal. Cardiovascular system: S1 & S2 heard, RRR.  No murmurs  Gastrointestinal system: Abdomen soft, tender in epigastrium and left upper quadrant-milder tenderness in left lower quadrant, nondistended. Normal bowel sound. No organomegaly Central nervous system: Alert and oriented. No focal neurological deficits. Extremities: No cyanosis, clubbing or edema Skin: No rashes or ulcers Psychiatry:  Mood & affect appropriate.     Data Reviewed: I have personally reviewed following labs and imaging studies  CBC:  Recent Labs Lab 03/06/16 1158 03/07/16 0601 03/08/16 0336  WBC 6.2 7.5 6.9  HGB 10.8* 9.2* 9.1*  HCT 33.4* 28.6* 28.9*  MCV 92.8 92.9 94.8  PLT 264 259 232   Basic Metabolic Panel:  Recent Labs Lab 03/06/16 1158 03/07/16 0601 03/07/16 1715  NA 140 140 138  K 3.8 3.8 4.0  CL 108 112* 111  CO2 25 23 24   GLUCOSE 155* 81 101*  BUN 7 5* 7  CREATININE 0.84 0.73 0.80  CALCIUM 9.0 8.3* 8.1*   GFR: Estimated Creatinine Clearance: 36.3 mL/min (by C-G formula based on SCr of 0.8 mg/dL). Liver Function Tests:  Recent Labs Lab 03/06/16 1158 03/07/16 1715  AST 96* 54*  ALT 72* 50  ALKPHOS 89 69  BILITOT 0.4 0.3  PROT 5.9* 4.8*  ALBUMIN 3.1* 2.4*    Recent Labs Lab 03/06/16 1158  LIPASE 18   No results for input(s):  AMMONIA in the last 168 hours. Coagulation Profile: No results for input(s): INR, PROTIME in the last 168 hours. Cardiac Enzymes: No results for input(s): CKTOTAL, CKMB, CKMBINDEX, TROPONINI in the last 168 hours. BNP (last 3 results) No results for input(s): PROBNP in the last 8760 hours. HbA1C: No results for input(s): HGBA1C in the last 72 hours. CBG:  Recent Labs Lab 03/06/16 1902 03/06/16 2146 03/07/16 0805 03/07/16 1214  GLUCAP 108* 108* 77 112*   Lipid Profile: No results for input(s): CHOL, HDL, LDLCALC, TRIG, CHOLHDL, LDLDIRECT in the last 72 hours. Thyroid Function Tests:  No results for input(s): TSH, T4TOTAL, FREET4, T3FREE, THYROIDAB in the last 72 hours. Anemia Panel: No results for input(s): VITAMINB12, FOLATE, FERRITIN, TIBC, IRON, RETICCTPCT in the last 72 hours. Urine analysis:    Component Value Date/Time   COLORURINE YELLOW 03/06/2016 1353   APPEARANCEUR CLEAR 03/06/2016 1353   LABSPEC 1.013 03/06/2016 1353   PHURINE 7.0 03/06/2016 1353   GLUCOSEU NEGATIVE 03/06/2016 1353   HGBUR NEGATIVE 03/06/2016 1353   BILIRUBINUR NEGATIVE 03/06/2016 1353   KETONESUR NEGATIVE 03/06/2016 1353   PROTEINUR NEGATIVE 03/06/2016 1353   UROBILINOGEN 0.2 05/27/2014 1122   NITRITE NEGATIVE 03/06/2016 1353   LEUKOCYTESUR LARGE (A) 03/06/2016 1353   Sepsis Labs: @LABRCNTIP (procalcitonin:4,lacticidven:4) ) Recent Results (from the past 240 hour(s))  Urine culture     Status: Abnormal   Collection Time: 02/28/16  9:25 PM  Result Value Ref Range Status   Specimen Description URINE, RANDOM  Final   Special Requests NONE  Final   Culture <10,000 COLONIES/mL INSIGNIFICANT GROWTH (A)  Final   Report Status 03/01/2016 FINAL  Final  Culture, Urine     Status: None   Collection Time: 03/06/16  1:53 PM  Result Value Ref Range Status   Specimen Description URINE, RANDOM  Final   Special Requests NONE  Final   Culture NO GROWTH  Final   Report Status 03/08/2016 FINAL  Final    Gastrointestinal Panel by PCR , Stool     Status: None   Collection Time: 03/06/16  4:33 PM  Result Value Ref Range Status   Campylobacter species NOT DETECTED NOT DETECTED Final   Plesimonas shigelloides NOT DETECTED NOT DETECTED Final   Salmonella species NOT DETECTED NOT DETECTED Final   Yersinia enterocolitica NOT DETECTED NOT DETECTED Final   Vibrio species NOT DETECTED NOT DETECTED Final   Vibrio cholerae NOT DETECTED NOT DETECTED Final   Enteroaggregative E coli (EAEC) NOT DETECTED NOT DETECTED Final   Enteropathogenic E coli (EPEC) NOT DETECTED NOT DETECTED Final   Enterotoxigenic E coli (ETEC) NOT DETECTED NOT DETECTED Final   Shiga like toxin producing E coli (STEC) NOT DETECTED NOT DETECTED Final   Shigella/Enteroinvasive E coli (EIEC) NOT DETECTED NOT DETECTED Final   Cryptosporidium NOT DETECTED NOT DETECTED Final   Cyclospora cayetanensis NOT DETECTED NOT DETECTED Final   Entamoeba histolytica NOT DETECTED NOT DETECTED Final   Giardia lamblia NOT DETECTED NOT DETECTED Final   Adenovirus F40/41 NOT DETECTED NOT DETECTED Final   Astrovirus NOT DETECTED NOT DETECTED Final   Norovirus GI/GII NOT DETECTED NOT DETECTED Final   Rotavirus A NOT DETECTED NOT DETECTED Final   Sapovirus (I, II, IV, and V) NOT DETECTED NOT DETECTED Final         Radiology Studies: No results found.    Scheduled Meds: . aspirin EC  81 mg Oral Daily  . atorvastatin  40 mg Oral q1800  . celecoxib  200 mg Oral Daily  . cholecalciferol  1,000 Units Oral Daily  . citalopram  20 mg Oral QPM  . clopidogrel  75 mg Oral Daily  . docusate sodium  100 mg Oral BID  . dorzolamide  1 drop Both Eyes BID  . feeding supplement (ENSURE ENLIVE)  237 mL Oral BID BM  . hydroxypropyl methylcellulose / hypromellose  1 drop Both Eyes BID  . latanoprost  1 drop Left Eye QHS  . levofloxacin  750 mg Oral Q48H  . lisinopril  10 mg Oral Daily  . metroNIDAZOLE  500 mg Oral Q8H  .  morphine  30 mg Oral Q12H  .  ondansetron (ZOFRAN) IV  4 mg Intravenous Q6H  . pantoprazole  20 mg Oral Daily  . prednisoLONE acetate  1 drop Right Eye BID   Continuous Infusions:   LOS: 2 days    Time spent in minutes: 35    Razia Screws, MD [facility] Pager: [email or web address] Password [email or web address] 03/08/2016, 2:51 PM

## 2016-03-08 NOTE — [place] FL2 (Signed)
[place] MEDICAID FL2 LEVEL OF CARE SCREENING TOOL     IDENTIFICATION  Patient Name: Alexis FateMildred A Eye Birthdate: 1927/05/03 Sex: female Admission Date (Current Location): 03/06/2016  [facility] [place]   Facility and Address:  [facility] [place], [address], [place], [place] [address]      Provider Number: [id number]  Attending Physician Name and Address:  Calvert CantorSaima Rizwan, MD  Relative Name and Phone Number:  Chales AbrahamsMary Ann, daughter, [phone]    Current Level of Care: Hospital Recommended Level of Care: Skilled Nursing Facility Prior Approval Number:    Date Approved/Denied:   PASRR Number: [id number]  Discharge Plan: SNF    Current Diagnoses: Patient Active Problem List   Diagnosis Date Noted  . Abdominal pain 03/07/2016  . Anemia 03/06/2016  . Diarrhea 03/06/2016  . Generalized weakness 03/06/2016  . Elevated transaminase level 03/06/2016  . Chronic pain   . Diverticulitis 02/29/2016  . ST elevation myocardial infarction (STEMI) of inferior wall, initial episode of care (HCC) 01/09/2016  . Urinary tract infection without hematuria   . NSTEMI (non-ST elevated myocardial infarction) (HCC) 01/08/2016  . UTI (urinary tract infection) 05/27/2014  . PID (acute pelvic inflammatory disease) 05/27/2014  . Vaginal inflammation from pessary (HCC) 05/27/2014  . Leukocytosis 05/27/2014  . Nausea and vomiting 05/27/2014  . Hypertension   . Hiatal hernia   . Lung nodules   . Glaucoma   . GERD (gastroesophageal reflux disease)     Orientation RESPIRATION BLADDER Height & Weight     Self, Time, Situation, Place  Normal Continent Weight: 50.3 kg (111 lb) Height:  5' (152.4 cm)  BEHAVIORAL SYMPTOMS/MOOD NEUROLOGICAL BOWEL NUTRITION STATUS      Continent Diet (Please see DC Summary)  AMBULATORY STATUS COMMUNICATION OF NEEDS Skin   Limited Assist Verbally Surgical wounds (Closed incision on arm)                       Personal  Care Assistance Level of Assistance  Bathing, Feeding, Dressing Bathing Assistance: Limited assistance Feeding assistance: Independent Dressing Assistance: Limited assistance     Functional Limitations Info  Sight Sight Info: Impaired        SPECIAL CARE FACTORS FREQUENCY  PT (By licensed PT)     PT Frequency: 5x/week              Contractures Contractures Info: Not present    Additional Factors Info  Code Status, Allergies Code Status Info: Full Allergies Info: Penicillins, Phenergan Promethazine Hcl           Current Medications (03/08/2016):  This is the current hospital active medication list Current Facility-Administered Medications  Medication Dose Route Frequency Provider Last Rate Last Dose  . acetaminophen (TYLENOL) tablet 650 mg  650 mg Oral Q6H PRN Gwenyth BenderKaren M Black, NP       Or  . acetaminophen (TYLENOL) suppository 650 mg  650 mg Rectal Q6H PRN Gwenyth BenderKaren M Black, NP      . ALPRAZolam Prudy Feeler(XANAX) tablet 0.25 mg  0.25 mg Oral BID PRN Gwenyth BenderKaren M Black, NP   0.25 mg at 03/07/16 2021  . aspirin EC tablet 81 mg  81 mg Oral Daily Mosetta AnisMichael T Bitonti, RPH   81 mg at 03/08/16 [phone]  . atorvastatin (LIPITOR) tablet 40 mg  40 mg Oral q1800 Gwenyth BenderKaren M Black, NP   40 mg at 03/07/16 1820  . celecoxib (CELEBREX) capsule 200 mg  200 mg Oral Daily Casimiro NeedleMichael  T Bitonti, RPH   200 mg at 03/08/16 0941  . cholecalciferol (VITAMIN D) tablet 1,000 Units  1,000 Units Oral Daily Gwenyth Bender, NP   1,000 Units at 03/08/16 0940  . citalopram (CELEXA) tablet 20 mg  20 mg Oral QPM Mosetta Anis, RPH   20 mg at 03/07/16 1820  . clopidogrel (PLAVIX) tablet 75 mg  75 mg Oral Daily Mosetta Anis, RPH   75 mg at 03/08/16 1937  . docusate sodium (COLACE) capsule 100 mg  100 mg Oral BID Gwenyth Bender, NP   100 mg at 03/08/16 0940  . dorzolamide (TRUSOPT) 2 % ophthalmic solution 1 drop  1 drop Both Eyes BID Gwenyth Bender, NP   1 drop at 03/08/16 [phone]  . feeding supplement (ENSURE ENLIVE) (ENSURE ENLIVE)  liquid 237 mL  237 mL Oral BID BM Lesle Chris Black, NP      . hydroxypropyl methylcellulose / hypromellose (ISOPTO TEARS / GONIOVISC) 2.5 % ophthalmic solution 1 drop  1 drop Both Eyes BID Gwenyth Bender, NP   1 drop at 03/08/16 [phone]  . latanoprost (XALATAN) 0.005 % ophthalmic solution 1 drop  1 drop Left Eye QHS Lesle Chris Black, NP      . levofloxacin [facility]) tablet 750 mg  750 mg Oral Q48H Saima Rizwan, MD   750 mg at 03/07/16 1022  . lisinopril (PRINIVIL,ZESTRIL) tablet 10 mg  10 mg Oral Daily Mosetta Anis, RPH   10 mg at 03/08/16 0940  . metroNIDAZOLE (FLAGYL) tablet 500 mg  500 mg Oral Q8H Calvert Cantor, MD   500 mg at 03/08/16 0551  . morphine (MS CONTIN) 12 hr tablet 30 mg  30 mg Oral Q12H Gwenyth Bender, NP   30 mg at 03/08/16 0940  . ondansetron (ZOFRAN) injection 4 mg  4 mg Intravenous Q6H Calvert Cantor, MD   4 mg at 03/08/16 0552  . oxyCODONE (Oxy IR/ROXICODONE) immediate release tablet 5 mg  5 mg Oral TID PRN Gwenyth Bender, NP   5 mg at 03/07/16 2021  . pantoprazole (PROTONIX) EC tablet 20 mg  20 mg Oral Daily Gwenyth Bender, NP   20 mg at 03/08/16 0940  . prednisoLONE acetate (PRED FORTE) 1 % ophthalmic suspension 1 drop  1 drop Right Eye BID Gwenyth Bender, NP   1 drop at 03/08/16 [phone]  . senna (SENOKOT) tablet 17.2 mg  2 tablet Oral QHS PRN Gwenyth Bender, NP         Discharge Medications: Please see discharge summary for a list of discharge medications.  Relevant Imaging Results:  Relevant Lab Results:   Additional Information SSN:  [id number]  Mearl Latin, LCSWA

## 2016-03-09 ENCOUNTER — Encounter
Admission: RE | Admit: 2016-03-09 | Discharge: 2016-03-09 | Disposition: A | Discharge status: Home / Self Care | Payer: Medicare Other | Source: Ambulatory Visit | Attending: Internal Medicine | Admitting: Internal Medicine

## 2016-03-09 DIAGNOSIS — N390 Urinary tract infection, site not specified: Secondary | ICD-10-CM | POA: Diagnosis not present

## 2016-03-09 DIAGNOSIS — A09 Infectious gastroenteritis and colitis, unspecified: Secondary | ICD-10-CM | POA: Diagnosis not present

## 2016-03-09 DIAGNOSIS — R1012 Left upper quadrant pain: Secondary | ICD-10-CM | POA: Diagnosis not present

## 2016-03-09 DIAGNOSIS — K5732 Diverticulitis of large intestine without perforation or abscess without bleeding: Principal | ICD-10-CM

## 2016-03-09 DIAGNOSIS — R112 Nausea with vomiting, unspecified: Secondary | ICD-10-CM | POA: Diagnosis not present

## 2016-03-09 DIAGNOSIS — R197 Diarrhea, unspecified: Secondary | ICD-10-CM | POA: Insufficient documentation

## 2016-03-09 MED ORDER — ONDANSETRON 4 MG PO TBDP: 4.0000 mg | ORAL_TABLET | Freq: Three times a day (TID) | ORAL | 0 refills | Status: DC | PRN

## 2016-03-09 MED ORDER — LEVOFLOXACIN 750 MG PO TABS: 750.0000 mg | ORAL_TABLET | Freq: Every day | ORAL | 0 refills | Status: AC

## 2016-03-09 MED ORDER — ALPRAZOLAM 0.25 MG PO TABS: 0.2500 mg | ORAL_TABLET | Freq: Two times a day (BID) | ORAL | 0 refills | Status: DC | PRN

## 2016-03-09 MED ORDER — ACETAMINOPHEN 325 MG PO TABS: 650.0000 mg | ORAL_TABLET | Freq: Four times a day (QID) | ORAL | Status: DC | PRN

## 2016-03-09 MED ORDER — OXYMORPHONE HCL ER 10 MG PO T12A: 10.0000 mg | EXTENDED_RELEASE_TABLET | Freq: Two times a day (BID) | ORAL | 0 refills | Status: DC

## 2016-03-09 MED ORDER — ONDANSETRON 4 MG PO TBDP: 4.0000 mg | ORAL_TABLET | Freq: Four times a day (QID) | ORAL | 0 refills | Status: AC

## 2016-03-09 MED ORDER — OXYCODONE HCL 5 MG PO TABS: 5.0000 mg | ORAL_TABLET | Freq: Four times a day (QID) | ORAL | 0 refills | Status: DC | PRN

## 2016-03-09 MED ORDER — METRONIDAZOLE 500 MG PO TABS: 500.0000 mg | ORAL_TABLET | Freq: Three times a day (TID) | ORAL | 0 refills | Status: AC

## 2016-03-09 NOTE — Clinical Social Work Note (Signed)
Clinical Social Work Assessment  Patient Details  Name: Alexis Yates MRN: [id number] Date of Birth: 1928-01-16  Date of referral:  03/09/16               Reason for consult:  Discharge Planning, Facility Placement                Permission sought to share information with:  Case Manager, Family Supports, Chartered certified accountant granted to share information::  Yes, Verbal Permission Granted  Name::        Agency::     Relationship::     Contact Information:     Housing/Transportation Living arrangements for the past 2 months:  Single Family Home Source of Information:  Patient, Adult Children Patient Interpreter Needed:  None Criminal Activity/Legal Involvement Pertinent to Current Situation/Hospitalization:  No - Comment as needed Significant Relationships:  Adult Children Lives with:  Self Do you feel safe going back to the place where you live?  No Need for family participation in patient care:  Yes (Comment)  Care giving concerns:  Prior to hospitalization pt was living alone. Pt agreeable that she needs SNF short time so that she can gain strength and return home independent.   Social Worker assessment / plan:  CSW met with pt at bedside. Pt pleasant, alert and oriented X3. Pt lives alone and understands that she needs short term rehab to return home independent. Pt has a great support system, 2 dtrs and a son-in-law. Pt reports that she would like to return to [facility] [place] went over co-pay information with pt in the event she pays past 20 days. Pt has used 16 days at this facility 10/31-11/16. Pt understands and is agreeable with this plan.  Employment status:  Retired Forensic scientist:  Medicare PT Recommendations:  [place] / Referral to community resources:     Patient/Family's Response to care:  Pt very positive and pleased with care she has received to date.  Patient/Family's Understanding of and Emotional Response  to Diagnosis, Current Treatment, and Prognosis:  Pt understands that she isn't at her baseline and needs to gain strength to safely move around her home independently.  Emotional Assessment Appearance:  Appears stated age, Well-Groomed Attitude/Demeanor/Rapport:   (Pleasant) Affect (typically observed):  Accepting Orientation:  Oriented to Self, Oriented to Place, Oriented to  Time, Oriented to Situation Alcohol / Substance use:  Never Used Psych involvement (Current and /or in the community):  No (Comment)  Discharge Needs  Concerns to be addressed:    Readmission within the last 30 days:  No Current discharge risk:  Lives alone Barriers to Discharge:  Continued Medical Work up   [place], [place], [place] 03/09/2016, 11:20 AM

## 2016-03-09 NOTE — Discharge Summary (Signed)
Physician Discharge Summary  Alexis Yates:[id number] DOB: 10-31-1927 DOA: 03/06/2016  PCP: Pearla Dubonnet, MD  Admit date: 03/06/2016 Discharge date: 03/09/2016  Admitted From: home Disposition:  SNF   Recommendations for Outpatient Follow-up:  1. Reassess GI symptoms and abdominal tenderness prior to completion of antibiotics to determine if course needs to be extended   Discharge Condition:  stable   CODE STATUS:  Full code   Diet recommendation:  Low sodium, heart healthy Consultations:      Discharge Diagnoses:  Principal Problem:   Nausea and vomiting Active Problems:   UTI (urinary tract infection)   Hypertension   GERD (gastroesophageal reflux disease)   Anemia   Diarrhea   Generalized weakness   Elevated transaminase level   Chronic pain   Abdominal pain    Subjective: Abdominal pain improving. Tolerating food without vomiting. Has mild nausea.   Brief Summary: This is an 82 year old female with a history of CAD, diverticulosis, hypertension, who was in the hospital from 1217 through 12/21. She presented at that time with lower abdominal pain for 1-2 days which had not improved despite Cipro that she had taken as outpatient for 5 days for a suspected UTI. In the ER, UA was found to have moderate leukocytosis and CT scan was equivocal for diverticulitis. She was treated in the hospital initially with Bactrim and Flagyl due to a penicillin allergy.  Patient also felt that oral Flagyl was causing her to vomit and therefore it was switched to IV Flagyl. She was eventually discharged on oral Cipro again for 5 more days. Oral Flagyl was apparently not given due to her GI side effects.The patient returns 2 days later with nausea vomiting, loose stools and abdominal pain.  Hospital Course:  Principal Problem:   Nausea and vomiting/   Abdominal pain- Probably diverticulitis - Clinical picture is a bit confusing as she has been on and off for a number of  antibiotics and we are not certain if her symptoms are from a UTI versus diverticulitis-CT on 12/17 showed extensive sigmoid and descending colonic diverticulosis with muscular hypertrophy and pericolonic scarring and therefore diverticulitis was not entirely excluded. - on exam she is tender in left upper and lower quadrants and therefore could potentially have a diverticulitis -Allergic to penicillin which causes anaphylaxis -Started on Merrem in the ER as she stated oral Cipro, Levaquin and Flagyl cause GI side effects such as nausea, vomiting and diarrhea - PICC line discussed the next day- the patient is hesitant to receive PICC line for treatment with Merrem due to risks involved with PICC line which are understandable- we decided to try oral antibiotics with concurrent Zofran - 12/24- started a trial of Levaquin and Flagyl orally and Zofran routine every 6 hours to see if we can control some of GI her side effects- she is tolerating the regimen well for past 2 days- she has mild nausea but no vomiting or diarrhea - Left abdominal pain and tenderness are nearly resolved - urine culture negative -GI panel negative  Active Problems:   UTI (urinary tract infection) severe bladder prolapse managed with pessary -As above- culture negative- noted that every UA she has had in the hospital (4 episodes since 2016) has been positive and wonder if she has a chronically positive UA due to her anatomy    Elevated transaminase level -Mildly elevated AST and ALT with normal total bili, lipase and alkaline phosphatase - improving- likely due to infection (?) -CT on 12/17 did not reveal  any liver or gallbladder abnormalities  LV outflow obstruction -Followed by cardiology-medical management-asymptomatic  CAD-MI in 10/17 -Cardiac cath revealed 100% stenosed RCA, DES stent was placed -Continue aspirin and Plavix    Hypertension - Lisinopril    Anemia - Likely anemia of chronic  disease-following- stable    Generalized weakness - PT eval>> she needs to go to SNF     Chronic pain    Discharge Instructions  Discharge Instructions    Diet - low sodium heart healthy    Complete by:  As directed    Increase activity slowly    Complete by:  As directed      Allergies as of 03/09/2016      Reactions   Penicillins Anaphylaxis   Has patient had a PCN reaction causing immediate rash, facial/tongue/throat swelling, SOB or lightheadedness with hypotension: Yes Has patient had a PCN reaction causing severe rash involving mucus membranes or skin necrosis: No Has patient had a PCN reaction that required hospitalization Yes Has patient had a PCN reaction occurring within the last 10 years: No If all of the above answers are "NO", then may proceed with Cephalosporin use.   Phenergan [promethazine Hcl] Other (See Comments)   Restless legs      Medication List    STOP taking these medications   ciprofloxacin 500 MG tablet Commonly known as:  CIPRO     TAKE these medications   acetaminophen 325 MG tablet Commonly known as:  TYLENOL Take 2 tablets (650 mg total) by mouth every 6 (six) hours as needed for mild pain (or Fever >/= 101).   ALPRAZolam 0.25 MG tablet Commonly known as:  XANAX Take 0.25 mg by mouth 2 (two) times daily as needed for anxiety.   aspirin 81 MG EC tablet Take 1 tablet (81 mg total) by mouth daily.   atorvastatin 40 MG tablet Commonly known as:  LIPITOR Take 1 tablet (40 mg total) by mouth daily at 6 PM.   bimatoprost 0.01 % Soln Commonly known as:  LUMIGAN Place 1 drop into the left eye at bedtime.   calcium-vitamin D 250-100 MG-UNIT tablet Take 1 tablet by mouth 2 (two) times daily.   celecoxib 200 MG capsule Commonly known as:  CELEBREX Take 200 mg by mouth daily.   citalopram 20 MG tablet Commonly known as:  CELEXA Take 20 mg by mouth every evening.   clopidogrel 75 MG tablet Commonly known as:  PLAVIX Take 1  tablet (75 mg total) by mouth daily.   docusate sodium 100 MG capsule Commonly known as:  COLACE Take 100 mg by mouth 2 (two) times daily.   dorzolamide 2 % ophthalmic solution Commonly known as:  TRUSOPT Place 1 drop into both eyes 2 (two) times daily.   feeding supplement (ENSURE ENLIVE) Liqd Take 237 mLs by mouth 2 (two) times daily between meals.   lansoprazole 30 MG capsule Commonly known as:  PREVACID Take 30 mg by mouth daily before breakfast.   levofloxacin 750 MG tablet Commonly known as:  LEVAQUIN Take 1 tablet (750 mg total) by mouth daily.   lisinopril 20 MG tablet Commonly known as:  PRINIVIL,ZESTRIL Take 10 mg by mouth daily.   metroNIDAZOLE 500 MG tablet Commonly known as:  FLAGYL Take 1 tablet (500 mg total) by mouth every 8 (eight) hours.   ondansetron 4 MG disintegrating tablet Commonly known as:  ZOFRAN ODT Take 1 tablet (4 mg total) by mouth 4 (four) times daily.   ondansetron 4 MG  disintegrating tablet Commonly known as:  ZOFRAN ODT Take 1 tablet (4 mg total) by mouth every 8 (eight) hours as needed for nausea or vomiting.   oxyCODONE 5 MG immediate release tablet Commonly known as:  Oxy IR/ROXICODONE Take 5 mg by mouth 3 (three) times daily as needed for severe pain or breakthrough pain (pain).   oxymorphone 10 MG T12a 12 hr tablet Commonly known as:  OPANA ER Take 10 mg by mouth every 12 (twelve) hours.   prednisoLONE acetate 1 % ophthalmic suspension Commonly known as:  PRED FORTE Place 1 drop into the right eye 2 (two) times daily.   senna 8.6 MG Tabs tablet Commonly known as:  SENOKOT Take 2 tablets (17.2 mg total) by mouth at bedtime as needed for mild constipation.   SYSTANE ULTRA 0.4-0.3 % Soln Generic drug:  Polyethyl Glycol-Propyl Glycol Place 1 drop into both eyes 2 (two) times daily.   Vitamin D (Cholecalciferol) 1000 units Tabs Take 1,000 Units by mouth daily.       Allergies  Allergen Reactions  . Penicillins  Anaphylaxis    Has patient had a PCN reaction causing immediate rash, facial/tongue/throat swelling, SOB or lightheadedness with hypotension: Yes Has patient had a PCN reaction causing severe rash involving mucus membranes or skin necrosis: No Has patient had a PCN reaction that required hospitalization Yes Has patient had a PCN reaction occurring within the last 10 years: No If all of the above answers are "NO", then may proceed with Cephalosporin use.   Marland Kitchen Phenergan [Promethazine Hcl] Other (See Comments)    Restless legs     Procedures/Studies:   Dg Chest 2 View  Result Date: 03/06/2016 CLINICAL DATA:  Shortness of breath, diarrhea, weakness EXAM: CHEST  2 VIEW COMPARISON:  01/08/2016 FINDINGS: Chronic interstitial markings. No focal consolidation. No pleural effusion or pneumothorax. The heart is top-normal in size. Right shoulder arthroplasty. Degenerative changes of the left shoulder. IMPRESSION: No evidence of acute cardiopulmonary disease. Electronically Signed   By: Charline Bills M.D.   On: 03/06/2016 13:00   Ct Abdomen Pelvis W Contrast  Result Date: 02/29/2016 CLINICAL DATA:  82 year old female with left lower quadrant abdominal pain. EXAM: CT ABDOMEN AND PELVIS WITH CONTRAST TECHNIQUE: Multidetector CT imaging of the abdomen and pelvis was performed using the standard protocol following bolus administration of intravenous contrast. CONTRAST:  ISOVUE-300 IOPAMIDOL (ISOVUE-300) INJECTION 61% COMPARISON:  None. FINDINGS: Lower chest: The visualized lung bases are clear. There is coronary vascular calcification. No intra-abdominal free air or free fluid Hepatobiliary: No focal liver abnormality is seen. No gallstones, gallbladder wall thickening, or biliary dilatation. Pancreas: Unremarkable. No pancreatic ductal dilatation or surrounding inflammatory changes. Spleen: Normal in size without focal abnormality. Adrenals/Urinary Tract: The adrenal glands, kidneys, visualized  ureters, and urinary bladder appear unremarkable. Stomach/Bowel: There is extensive sigmoid and descending colon diverticulosis with muscular hypertrophy. Mild pericolonic haziness along the descending colon may be related to chronic inflammation/ scarring, mild acute diverticulitis is not entirely excluded. Clinical correlation is recommended. There is no evidence of diverticular perforation or abscess. Moderate stool noted in the proximal colon. No evidence of bowel obstruction. Normal appendix. Vascular/Lymphatic: There is advanced aortoiliac atherosclerotic disease. No aneurysmal dilatation or evidence of dissection. The origins of the celiac axis is patent. There is chronic appearing thrombus or noncalcified plaque at the origin of the SMA with focal narrowing of the vessel. The SMA however remains patent. The IMA is patent as well. The SMV, splenic vein, and main portal  veins are patent. No portal venous gas identified. There is no adenopathy. Reproductive: Hysterectomy.  A pessary is noted within the pelvis. Other: None Musculoskeletal: Osteopenia with scoliosis and degenerative changes of the spine. Multilevel disc desiccation with vacuum phenomena. No acute fracture. IMPRESSION: Extensive sigmoid and descending colonic diverticulosis with muscular hypertrophy and pericolonic scarring. Mild acute diverticulitis is not entirely excluded. Clinical correlation is recommended. No bowel obstruction. Normal appendix. Electronically Signed   By: Elgie CollardArash  Radparvar M.D.   On: 02/29/2016 04:16       Discharge Exam: Vitals:   03/08/16 1937 03/09/16 0511  BP: 114/81 134/62  Pulse: 84 75  Resp: 17 16  Temp: 98 F (36.7 C) 97.6 F (36.4 C)   Vitals:   03/08/16 0623 03/08/16 1448 03/08/16 1937 03/09/16 0511  BP: 128/66 123/80 114/81 134/62  Pulse: 60 95 84 75  Resp: 16 16 17 16   Temp: 97.7 F (36.5 C) 97.7 F (36.5 C) 98 F (36.7 C) 97.6 F (36.4 C)  TempSrc: Oral Oral Oral Oral  SpO2: 93% 95% 95%  94%  Weight:      Height:        General: Pt is alert, awake, not in acute distress Cardiovascular: RRR, S1/S2 +, no rubs, no gallops Respiratory: CTA bilaterally, no wheezing, no rhonchi Abdominal: Soft, NT, ND, bowel sounds + Extremities: no edema, no cyanosis    The results of significant diagnostics from this hospitalization (including imaging, microbiology, ancillary and laboratory) are listed below for reference.     Microbiology: Recent Results (from the past 240 hour(s))  Urine culture     Status: Abnormal   Collection Time: 02/28/16  9:25 PM  Result Value Ref Range Status   Specimen Description URINE, RANDOM  Final   Special Requests NONE  Final   Culture <10,000 COLONIES/mL INSIGNIFICANT GROWTH (A)  Final   Report Status 03/01/2016 FINAL  Final  Culture, Urine     Status: None   Collection Time: 03/06/16  1:53 PM  Result Value Ref Range Status   Specimen Description URINE, RANDOM  Final   Special Requests NONE  Final   Culture NO GROWTH  Final   Report Status 03/08/2016 FINAL  Final  Gastrointestinal Panel by PCR , Stool     Status: None   Collection Time: 03/06/16  4:33 PM  Result Value Ref Range Status   Campylobacter species NOT DETECTED NOT DETECTED Final   Plesimonas shigelloides NOT DETECTED NOT DETECTED Final   Salmonella species NOT DETECTED NOT DETECTED Final   Yersinia enterocolitica NOT DETECTED NOT DETECTED Final   Vibrio species NOT DETECTED NOT DETECTED Final   Vibrio cholerae NOT DETECTED NOT DETECTED Final   Enteroaggregative E coli (EAEC) NOT DETECTED NOT DETECTED Final   Enteropathogenic E coli (EPEC) NOT DETECTED NOT DETECTED Final   Enterotoxigenic E coli (ETEC) NOT DETECTED NOT DETECTED Final   Shiga like toxin producing E coli (STEC) NOT DETECTED NOT DETECTED Final   Shigella/Enteroinvasive E coli (EIEC) NOT DETECTED NOT DETECTED Final   Cryptosporidium NOT DETECTED NOT DETECTED Final   Cyclospora cayetanensis NOT DETECTED NOT DETECTED  Final   Entamoeba histolytica NOT DETECTED NOT DETECTED Final   Giardia lamblia NOT DETECTED NOT DETECTED Final   Adenovirus F40/41 NOT DETECTED NOT DETECTED Final   Astrovirus NOT DETECTED NOT DETECTED Final   Norovirus GI/GII NOT DETECTED NOT DETECTED Final   Rotavirus A NOT DETECTED NOT DETECTED Final   Sapovirus (I, II, IV, and V) NOT DETECTED NOT  DETECTED Final     Labs: BNP (last 3 results) No results for input(s): BNP in the last 8760 hours. Basic Metabolic Panel:  Recent Labs Lab 03/06/16 1158 03/07/16 0601 03/07/16 1715  NA 140 140 138  K 3.8 3.8 4.0  CL 108 112* 111  CO2 25 23 24   GLUCOSE 155* 81 101*  BUN 7 5* 7  CREATININE 0.84 0.73 0.80  CALCIUM 9.0 8.3* 8.1*   Liver Function Tests:  Recent Labs Lab 03/06/16 1158 03/07/16 1715  AST 96* 54*  ALT 72* 50  ALKPHOS 89 69  BILITOT 0.4 0.3  PROT 5.9* 4.8*  ALBUMIN 3.1* 2.4*    Recent Labs Lab 03/06/16 1158  LIPASE 18   No results for input(s): AMMONIA in the last 168 hours. CBC:  Recent Labs Lab 03/06/16 1158 03/07/16 0601 03/08/16 0336  WBC 6.2 7.5 6.9  HGB 10.8* 9.2* 9.1*  HCT 33.4* 28.6* 28.9*  MCV 92.8 92.9 94.8  PLT 264 259 232   Cardiac Enzymes: No results for input(s): CKTOTAL, CKMB, CKMBINDEX, TROPONINI in the last 168 hours. BNP: Invalid input(s): POCBNP CBG:  Recent Labs Lab 03/06/16 1902 03/06/16 2146 03/07/16 0805 03/07/16 1214  GLUCAP 108* 108* 77 112*   D-Dimer No results for input(s): DDIMER in the last 72 hours. Hgb A1c No results for input(s): HGBA1C in the last 72 hours. Lipid Profile No results for input(s): CHOL, HDL, LDLCALC, TRIG, CHOLHDL, LDLDIRECT in the last 72 hours. Thyroid function studies No results for input(s): TSH, T4TOTAL, T3FREE, THYROIDAB in the last 72 hours.  Invalid input(s): FREET3 Anemia work up No results for input(s): VITAMINB12, FOLATE, FERRITIN, TIBC, IRON, RETICCTPCT in the last 72 hours. Urinalysis    Component Value Date/Time    COLORURINE YELLOW 03/06/2016 1353   APPEARANCEUR CLEAR 03/06/2016 1353   LABSPEC 1.013 03/06/2016 1353   PHURINE 7.0 03/06/2016 1353   GLUCOSEU NEGATIVE 03/06/2016 1353   HGBUR NEGATIVE 03/06/2016 1353   BILIRUBINUR NEGATIVE 03/06/2016 1353   KETONESUR NEGATIVE 03/06/2016 1353   PROTEINUR NEGATIVE 03/06/2016 1353   UROBILINOGEN 0.2 05/27/2014 1122   NITRITE NEGATIVE 03/06/2016 1353   LEUKOCYTESUR LARGE (A) 03/06/2016 1353   Sepsis Labs Invalid input(s): PROCALCITONIN,  WBC,  LACTICIDVEN Microbiology Recent Results (from the past 240 hour(s))  Urine culture     Status: Abnormal   Collection Time: 02/28/16  9:25 PM  Result Value Ref Range Status   Specimen Description URINE, RANDOM  Final   Special Requests NONE  Final   Culture <10,000 COLONIES/mL INSIGNIFICANT GROWTH (A)  Final   Report Status 03/01/2016 FINAL  Final  Culture, Urine     Status: None   Collection Time: 03/06/16  1:53 PM  Result Value Ref Range Status   Specimen Description URINE, RANDOM  Final   Special Requests NONE  Final   Culture NO GROWTH  Final   Report Status 03/08/2016 FINAL  Final  Gastrointestinal Panel by PCR , Stool     Status: None   Collection Time: 03/06/16  4:33 PM  Result Value Ref Range Status   Campylobacter species NOT DETECTED NOT DETECTED Final   Plesimonas shigelloides NOT DETECTED NOT DETECTED Final   Salmonella species NOT DETECTED NOT DETECTED Final   Yersinia enterocolitica NOT DETECTED NOT DETECTED Final   Vibrio species NOT DETECTED NOT DETECTED Final   Vibrio cholerae NOT DETECTED NOT DETECTED Final   Enteroaggregative E coli (EAEC) NOT DETECTED NOT DETECTED Final   Enteropathogenic E coli (EPEC) NOT DETECTED NOT  DETECTED Final   Enterotoxigenic E coli (ETEC) NOT DETECTED NOT DETECTED Final   Shiga like toxin producing E coli (STEC) NOT DETECTED NOT DETECTED Final   Shigella/Enteroinvasive E coli (EIEC) NOT DETECTED NOT DETECTED Final   Cryptosporidium NOT DETECTED NOT  DETECTED Final   Cyclospora cayetanensis NOT DETECTED NOT DETECTED Final   Entamoeba histolytica NOT DETECTED NOT DETECTED Final   Giardia lamblia NOT DETECTED NOT DETECTED Final   Adenovirus F40/41 NOT DETECTED NOT DETECTED Final   Astrovirus NOT DETECTED NOT DETECTED Final   Norovirus GI/GII NOT DETECTED NOT DETECTED Final   Rotavirus A NOT DETECTED NOT DETECTED Final   Sapovirus (I, II, IV, and V) NOT DETECTED NOT DETECTED Final     Time coordinating discharge: Over 30 minutes  SIGNED:   Calvert Cantor, MD  [facility] 03/09/2016, 11:19 AM Pager   If 7PM-7AM, please contact night-coverage [email or web address] Password [email or web address]

## 2016-03-09 NOTE — Progress Notes (Signed)
Report called to [facility].

## 2016-03-09 NOTE — Clinical Social Work Placement (Signed)
   CLINICAL SOCIAL WORK PLACEMENT  NOTE  Date:  03/09/2016  Patient Details  Name: Alexis Yates MRN: [id number] Date of Birth: May 11, 1927  Clinical Social Work is seeking post-discharge placement for this patient at the Skilled  Nursing Facility level of care (*CSW will initial, date and re-position this form in  chart as items are completed):  Yes   Patient/family provided with [facility] Social Work Department's list of facilities offering this level of care within the geographic area requested by the patient (or if unable, by the patient's family).  Yes   Patient/family informed of their freedom to choose among providers that offer the needed level of care, that participate in Medicare, Medicaid or managed care program needed by the patient, have an available bed and are willing to accept the patient.  Yes   Patient/family informed of 's ownership interest in [facility] and [facility], as well as of the fact that they are under no obligation to receive care at these facilities.  PASRR submitted to EDS on       PASRR number received on       Existing PASRR number confirmed on 03/08/16     FL2 transmitted to all facilities in geographic area requested by pt/family on 03/08/16     FL2 transmitted to all facilities within larger geographic area on       Patient informed that his/her managed care company has contracts with or will negotiate with certain facilities, including the following:            Patient/family informed of bed offers received.  Patient chooses bed at       Physician recommends and patient chooses bed at      Patient to be transferred to   on  .  Patient to be transferred to facility by       Patient family notified on   of transfer.  Name of family member notified:        PHYSICIAN Please sign FL2     Additional Comment:    _______________________________________________ Norlene Duel, LCSWA 03/09/2016, 11:25  AM

## 2016-03-09 NOTE — Progress Notes (Signed)
Pt.'s daughter given AVS PAPERS and prescriptions to take with her to [facility].

## 2016-03-09 NOTE — Discharge Instructions (Signed)
Diverticulosis Diverticulosis is the condition that develops when small pouches (diverticula) form in the wall of your colon. Your colon, or large intestine, is where water is absorbed and stool is formed. The pouches form when the inside layer of your colon pushes through weak spots in the outer layers of your colon. CAUSES  No one knows exactly what causes diverticulosis. RISK FACTORS  Being older than 50. Your risk for this condition increases with age. Diverticulosis is rare in people younger than 40 years. By age 68, almost everyone has it.  Eating a low-fiber diet.  Being frequently constipated.  Being overweight.  Not getting enough exercise.  Smoking.  Taking over-the-counter pain medicines, like aspirin and ibuprofen. SYMPTOMS  Most people with diverticulosis do not have symptoms. DIAGNOSIS  Because diverticulosis often has no symptoms, health care providers often discover the condition during an exam for other colon problems. In many cases, a health care provider will diagnose diverticulosis while using a flexible scope to examine the colon (colonoscopy). TREATMENT  If you have never developed an infection related to diverticulosis, you may not need treatment. If you have had an infection before, treatment may include:  Eating more fruits, vegetables, and grains.  Taking a fiber supplement.  Taking a live bacteria supplement (probiotic).  Taking medicine to relax your colon. HOME CARE INSTRUCTIONS   Drink at least 6-8 glasses of water each day to prevent constipation.  Try not to strain when you have a bowel movement.  Keep all follow-up appointments. If you have had an infection before:  Increase the fiber in your diet as directed by your health care provider or dietitian.  Take a dietary fiber supplement if your health care provider approves.  Only take medicines as directed by your health care provider. SEEK MEDICAL CARE IF:   You have abdominal  pain.  You have bloating.  You have cramps.  You have not gone to the bathroom in 3 days. SEEK IMMEDIATE MEDICAL CARE IF:   Your pain gets worse.  Yourbloating becomes very bad.  You have a fever or chills, and your symptoms suddenly get worse.  You begin vomiting.  You have bowel movements that are bloody or black. MAKE SURE YOU:  Understand these instructions.  Will watch your condition.  Will get help right away if you are not doing well or get worse. This information is not intended to replace advice given to you by your health care provider. Make sure you discuss any questions you have with your health care provider. Document Released: 11/27/2003 Document Revised: 03/06/2013 Document Reviewed: 01/24/2013 Elsevier Interactive Patient Education  2017 [employer]  Please take all your medications with you for your next visit with your Primary MD. Please request your Primary MD to go over all hospital test results at the follow up. Please ask your Primary MD to get all Hospital records sent to his/her office.  If you experience worsening of your admission symptoms, develop shortness of breath, chest pain, suicidal or homicidal thoughts or a life threatening emergency, you must seek medical attention immediately by calling 911 or calling your MD.  Bonita Quin must read the complete instructions/literature along with all the possible adverse reactions/side effects for all the medicines you take including new medications that have been prescribed to you. Take new medicines after you have completely understood and accpet all the possible adverse reactions/side effects.   Do not drive when taking pain medications or sedatives.    Do not take more than prescribed  Pain, Sleep and Anxiety Medications  If you have smoked or chewed Tobacco in the last 2 yrs please stop. Stop any regular alcohol and or recreational drug use.  Wear Seat belts while driving.

## 2016-03-09 NOTE — Clinical Social Work Note (Addendum)
CSW met with pt @ bedside to discuss bed offers. Pt pleasant, alert and oriented X3. Pt reported to CSW that she would like to go back to [facility] for rehab. CSW also called pt's dtr while in pt's room to advise. Pt/Family very pleased with this facility and pt has been there twice. CSW advised pt/family of the 16 days used at this facility from Oct 31-Nov 16, pt/family understand if pt stays past 20 days pt will be responsible for co-pay. CSW will notify facility that pt will accept bed offer and coordinate DC to this facility. CSW will remain available for pt/family for any needs/support.  CSW notified by [facility], shared room; bed available for pt. No private room available at this time but pt placed on list. CSW informed MD. DC Summary/Transfr Rpt faxed to facility via hub. CSW spoke to pt about [facility], pt understands that after 20 day stay at facility co-pay will kick in and will be approximately 160-180 daily. Family notified (Dtr Audrea Muscat), family verbalized they will transport pt to facility. Nurse updated. Pt has no other needs at this time.   NURSE PLEASE CALL REPORT [phone].  CSW is signing off.  Taevyn Hausen B. Joline Maxcy Clinical Social Work Dept Weekend Social Worker [phone] 11:13 AM

## 2016-03-11 ENCOUNTER — Non-Acute Institutional Stay (SKILLED_NURSING_FACILITY): Payer: Medicare Other | Admitting: Gerontology

## 2016-03-11 DIAGNOSIS — R197 Diarrhea, unspecified: Secondary | ICD-10-CM | POA: Diagnosis present

## 2016-03-11 DIAGNOSIS — R112 Nausea with vomiting, unspecified: Secondary | ICD-10-CM | POA: Diagnosis not present

## 2016-03-11 LAB — CBC WITH DIFFERENTIAL/PLATELET
BASOS ABS: 0 10*3/uL (ref 0–0.1)
Basophils Relative: 0 %
Eosinophils Absolute: 0.2 10*3/uL (ref 0–0.7)
Eosinophils Relative: 2 %
HEMATOCRIT: 32.7 % — AB (ref 35.0–47.0)
Hemoglobin: 10.8 g/dL — ABNORMAL LOW (ref 12.0–16.0)
LYMPHS ABS: 0.7 10*3/uL — AB (ref 1.0–3.6)
LYMPHS PCT: 9 %
MCH: 30.1 pg (ref 26.0–34.0)
MCHC: 32.9 g/dL (ref 32.0–36.0)
MCV: 91.4 fL (ref 80.0–100.0)
MONO ABS: 0.8 10*3/uL (ref 0.2–0.9)
MONOS PCT: 9 %
NEUTROS ABS: 7.1 10*3/uL — AB (ref 1.4–6.5)
Neutrophils Relative %: 80 %
Platelets: 305 10*3/uL (ref 150–440)
RBC: 3.58 MIL/uL — ABNORMAL LOW (ref 3.80–5.20)
RDW: 14.6 % — AB (ref 11.5–14.5)
WBC: 8.8 10*3/uL (ref 3.6–11.0)

## 2016-03-11 LAB — COMPREHENSIVE METABOLIC PANEL
ALT: 34 U/L (ref 14–54)
AST: 45 U/L — AB (ref 15–41)
Albumin: 3 g/dL — ABNORMAL LOW (ref 3.5–5.0)
Alkaline Phosphatase: 66 U/L (ref 38–126)
Anion gap: 6 (ref 5–15)
BILIRUBIN TOTAL: 0.6 mg/dL (ref 0.3–1.2)
BUN: 10 mg/dL (ref 6–20)
CO2: 26 mmol/L (ref 22–32)
CREATININE: 1.02 mg/dL — AB (ref 0.44–1.00)
Calcium: 8.8 mg/dL — ABNORMAL LOW (ref 8.9–10.3)
Chloride: 108 mmol/L (ref 101–111)
GFR, EST AFRICAN AMERICAN: 56 mL/min — AB (ref >60)
GFR, EST NON AFRICAN AMERICAN: 48 mL/min — AB (ref >60)
Glucose, Bld: 136 mg/dL — ABNORMAL HIGH (ref 65–99)
POTASSIUM: 3.8 mmol/L (ref 3.5–5.1)
Sodium: 140 mmol/L (ref 135–145)
TOTAL PROTEIN: 5.6 g/dL — AB (ref 6.5–8.1)

## 2016-03-11 LAB — INFLUENZA PANEL BY PCR (TYPE A & B)
INFLAPCR: NEGATIVE
INFLBPCR: NEGATIVE

## 2016-03-15 ENCOUNTER — Emergency Department (HOSPITAL_COMMUNITY): Payer: Medicare Other

## 2016-03-15 ENCOUNTER — Observation Stay (HOSPITAL_COMMUNITY)
Admission: EM | Admit: 2016-03-15 | Discharge: 2016-03-15 | Disposition: A | Discharge status: Home / Self Care | Payer: Medicare Other | Attending: Emergency Medicine | Admitting: Emergency Medicine

## 2016-03-15 ENCOUNTER — Encounter
Admission: RE | Admit: 2016-03-15 | Discharge: 2016-03-15 | Disposition: A | Discharge status: Home / Self Care | Payer: Medicare Other | Source: Ambulatory Visit | Attending: Internal Medicine | Admitting: Internal Medicine

## 2016-03-15 DIAGNOSIS — G8929 Other chronic pain: Secondary | ICD-10-CM | POA: Insufficient documentation

## 2016-03-15 DIAGNOSIS — I10 Essential (primary) hypertension: Secondary | ICD-10-CM | POA: Diagnosis not present

## 2016-03-15 DIAGNOSIS — N390 Urinary tract infection, site not specified: Secondary | ICD-10-CM | POA: Diagnosis not present

## 2016-03-15 DIAGNOSIS — Z87898 Personal history of other specified conditions: Secondary | ICD-10-CM | POA: Insufficient documentation

## 2016-03-15 DIAGNOSIS — D649 Anemia, unspecified: Secondary | ICD-10-CM | POA: Insufficient documentation

## 2016-03-15 DIAGNOSIS — R410 Disorientation, unspecified: Secondary | ICD-10-CM | POA: Insufficient documentation

## 2016-03-15 DIAGNOSIS — J90 Pleural effusion, not elsewhere classified: Secondary | ICD-10-CM | POA: Insufficient documentation

## 2016-03-15 DIAGNOSIS — M1990 Unspecified osteoarthritis, unspecified site: Secondary | ICD-10-CM | POA: Insufficient documentation

## 2016-03-15 DIAGNOSIS — I517 Cardiomegaly: Secondary | ICD-10-CM | POA: Diagnosis not present

## 2016-03-15 DIAGNOSIS — Z7982 Long term (current) use of aspirin: Secondary | ICD-10-CM | POA: Insufficient documentation

## 2016-03-15 DIAGNOSIS — M419 Scoliosis, unspecified: Secondary | ICD-10-CM | POA: Insufficient documentation

## 2016-03-15 DIAGNOSIS — H409 Unspecified glaucoma: Secondary | ICD-10-CM | POA: Diagnosis not present

## 2016-03-15 DIAGNOSIS — Z800 Family history of malignant neoplasm of digestive organs: Secondary | ICD-10-CM | POA: Insufficient documentation

## 2016-03-15 DIAGNOSIS — K449 Diaphragmatic hernia without obstruction or gangrene: Secondary | ICD-10-CM | POA: Insufficient documentation

## 2016-03-15 DIAGNOSIS — I252 Old myocardial infarction: Secondary | ICD-10-CM | POA: Insufficient documentation

## 2016-03-15 DIAGNOSIS — J449 Chronic obstructive pulmonary disease, unspecified: Secondary | ICD-10-CM | POA: Insufficient documentation

## 2016-03-15 DIAGNOSIS — R918 Other nonspecific abnormal finding of lung field: Secondary | ICD-10-CM | POA: Insufficient documentation

## 2016-03-15 DIAGNOSIS — K219 Gastro-esophageal reflux disease without esophagitis: Secondary | ICD-10-CM | POA: Diagnosis not present

## 2016-03-15 DIAGNOSIS — R531 Weakness: Principal | ICD-10-CM | POA: Insufficient documentation

## 2016-03-15 DIAGNOSIS — R262 Difficulty in walking, not elsewhere classified: Secondary | ICD-10-CM | POA: Diagnosis not present

## 2016-03-15 DIAGNOSIS — D72829 Elevated white blood cell count, unspecified: Secondary | ICD-10-CM | POA: Diagnosis not present

## 2016-03-15 DIAGNOSIS — Z8249 Family history of ischemic heart disease and other diseases of the circulatory system: Secondary | ICD-10-CM | POA: Insufficient documentation

## 2016-03-15 DIAGNOSIS — Z880 Allergy status to penicillin: Secondary | ICD-10-CM | POA: Insufficient documentation

## 2016-03-15 DIAGNOSIS — M40204 Unspecified kyphosis, thoracic region: Secondary | ICD-10-CM | POA: Diagnosis not present

## 2016-03-15 DIAGNOSIS — Z90710 Acquired absence of both cervix and uterus: Secondary | ICD-10-CM | POA: Insufficient documentation

## 2016-03-15 DIAGNOSIS — I2510 Atherosclerotic heart disease of native coronary artery without angina pectoris: Secondary | ICD-10-CM | POA: Insufficient documentation

## 2016-03-15 DIAGNOSIS — Z87891 Personal history of nicotine dependence: Secondary | ICD-10-CM | POA: Insufficient documentation

## 2016-03-15 DIAGNOSIS — Z79899 Other long term (current) drug therapy: Secondary | ICD-10-CM | POA: Insufficient documentation

## 2016-03-15 DIAGNOSIS — Z833 Family history of diabetes mellitus: Secondary | ICD-10-CM | POA: Insufficient documentation

## 2016-03-15 LAB — HEPATIC FUNCTION PANEL
ALT: 25 U/L (ref 14–54)
AST: 34 U/L (ref 15–41)
Albumin: 3.2 g/dL — ABNORMAL LOW (ref 3.5–5.0)
Alkaline Phosphatase: 64 U/L (ref 38–126)
TOTAL PROTEIN: 6.2 g/dL — AB (ref 6.5–8.1)
Total Bilirubin: 0.3 mg/dL (ref 0.3–1.2)

## 2016-03-15 LAB — BASIC METABOLIC PANEL
Anion gap: 7 (ref 5–15)
BUN: 13 mg/dL (ref 6–20)
CALCIUM: 9.1 mg/dL (ref 8.9–10.3)
CO2: 24 mmol/L (ref 22–32)
CREATININE: 1.2 mg/dL — AB (ref 0.44–1.00)
Chloride: 110 mmol/L (ref 101–111)
GFR calc non Af Amer: 40 mL/min — ABNORMAL LOW (ref >60)
GFR, EST AFRICAN AMERICAN: 46 mL/min — AB (ref >60)
Glucose, Bld: 138 mg/dL — ABNORMAL HIGH (ref 65–99)
Potassium: 3.9 mmol/L (ref 3.5–5.1)
Sodium: 141 mmol/L (ref 135–145)

## 2016-03-15 LAB — URINALYSIS, ROUTINE W REFLEX MICROSCOPIC
BILIRUBIN URINE: NEGATIVE
GLUCOSE, UA: NEGATIVE mg/dL
Hgb urine dipstick: NEGATIVE
KETONES UR: NEGATIVE mg/dL
NITRITE: NEGATIVE
PH: 7 (ref 5.0–8.0)
PROTEIN: NEGATIVE mg/dL
Specific Gravity, Urine: 1.009 (ref 1.005–1.030)

## 2016-03-15 LAB — CBC
HCT: 36.9 % (ref 36.0–46.0)
Hemoglobin: 12 g/dL (ref 12.0–15.0)
MCH: 29.6 pg (ref 26.0–34.0)
MCHC: 32.5 g/dL (ref 30.0–36.0)
MCV: 91.1 fL (ref 78.0–100.0)
PLATELETS: 384 10*3/uL (ref 150–400)
RBC: 4.05 MIL/uL (ref 3.87–5.11)
RDW: 14.3 % (ref 11.5–15.5)
WBC: 9.4 10*3/uL (ref 4.0–10.5)

## 2016-03-15 MED ORDER — DOCUSATE SODIUM 100 MG PO CAPS: 100.0000 mg | ORAL_CAPSULE | Freq: Two times a day (BID) | ORAL | Status: DC

## 2016-03-15 MED ORDER — ROPINIROLE HCL 0.25 MG PO TABS
0.2500 mg | ORAL_TABLET | Freq: Every day | ORAL | Status: DC | PRN
  Filled 2016-03-15: qty 1

## 2016-03-15 MED ORDER — LISINOPRIL 10 MG PO TABS: 10.0000 mg | ORAL_TABLET | Freq: Every day | ORAL | Status: DC

## 2016-03-15 MED ORDER — CELECOXIB 200 MG PO CAPS: 200.0000 mg | ORAL_CAPSULE | Freq: Every day | ORAL | Status: DC

## 2016-03-15 MED ORDER — MORPHINE SULFATE ER 30 MG PO TBCR
30.0000 mg | EXTENDED_RELEASE_TABLET | Freq: Two times a day (BID) | ORAL | Status: DC
Start: 2016-03-15 — End: 2016-03-15

## 2016-03-15 MED ORDER — ENSURE ENLIVE PO LIQD
237.0000 mL | Freq: Two times a day (BID) | ORAL | Status: DC
Start: 2016-03-16 — End: 2016-03-15

## 2016-03-15 MED ORDER — CLOPIDOGREL BISULFATE 75 MG PO TABS: 75.0000 mg | ORAL_TABLET | Freq: Every day | ORAL | Status: DC

## 2016-03-15 MED ORDER — PREDNISOLONE ACETATE 1 % OP SUSP
1.0000 [drp] | Freq: Two times a day (BID) | OPHTHALMIC | Status: DC
Start: 2016-03-15 — End: 2016-03-15

## 2016-03-15 MED ORDER — POLYETHYL GLYCOL-PROPYL GLYCOL 0.4-0.3 % OP SOLN: 1.0000 [drp] | Freq: Two times a day (BID) | OPHTHALMIC | Status: DC

## 2016-03-15 MED ORDER — ATORVASTATIN CALCIUM 40 MG PO TABS: 40.0000 mg | ORAL_TABLET | Freq: Every evening | ORAL | Status: DC

## 2016-03-15 MED ORDER — ONDANSETRON 4 MG PO TBDP: 4.0000 mg | ORAL_TABLET | Freq: Three times a day (TID) | ORAL | Status: DC | PRN

## 2016-03-15 MED ORDER — CALCIUM CITRATE-VITAMIN D 250-100 MG-UNIT PO TABS: 1.0000 | ORAL_TABLET | Freq: Two times a day (BID) | ORAL | Status: DC

## 2016-03-15 MED ORDER — OXYCODONE HCL 5 MG PO TABS: 5.0000 mg | ORAL_TABLET | Freq: Three times a day (TID) | ORAL | Status: DC | PRN

## 2016-03-15 MED ORDER — SENNA 8.6 MG PO TABS: 2.0000 | ORAL_TABLET | Freq: Every evening | ORAL | Status: DC | PRN

## 2016-03-15 MED ORDER — PANTOPRAZOLE SODIUM 20 MG PO TBEC: 20.0000 mg | DELAYED_RELEASE_TABLET | Freq: Every day | ORAL | Status: DC

## 2016-03-15 MED ORDER — SODIUM CHLORIDE 0.9 % IV BOLUS (SEPSIS): 1000.0000 mL | Freq: Once | INTRAVENOUS | Status: DC

## 2016-03-15 MED ORDER — CITALOPRAM HYDROBROMIDE 10 MG PO TABS: 20.0000 mg | ORAL_TABLET | Freq: Every evening | ORAL | Status: DC

## 2016-03-15 MED ORDER — ASPIRIN 81 MG PO TBEC: 81.0000 mg | DELAYED_RELEASE_TABLET | Freq: Every day | ORAL | Status: DC

## 2016-03-15 MED ORDER — SODIUM CHLORIDE 0.9 % IV SOLN: INTRAVENOUS | Status: DC

## 2016-03-15 MED ORDER — LATANOPROST 0.005 % OP SOLN: 1.0000 [drp] | Freq: Every day | OPHTHALMIC | Status: DC

## 2016-03-15 MED ORDER — ACETAMINOPHEN 325 MG PO TABS: 650.0000 mg | ORAL_TABLET | Freq: Four times a day (QID) | ORAL | Status: DC | PRN

## 2016-03-15 MED ORDER — VITAMIN D3 25 MCG (1000 UT) PO CAPS
1000.0000 [IU] | ORAL_CAPSULE | Freq: Every morning | ORAL | Status: DC
Start: 2016-03-16 — End: 2016-03-15

## 2016-03-15 MED ORDER — HYDROCODONE-ACETAMINOPHEN 5-325 MG PO TABS
1.0000 | ORAL_TABLET | Freq: Once | ORAL | Status: AC
  Administered 2016-03-15: 1 via ORAL
  Filled 2016-03-15: qty 1

## 2016-03-15 MED ORDER — DORZOLAMIDE HCL 2 % OP SOLN: 1.0000 [drp] | Freq: Two times a day (BID) | OPHTHALMIC | Status: DC

## 2016-03-15 MED ORDER — CLONAZEPAM 0.5 MG PO TABS: 0.5000 mg | ORAL_TABLET | Freq: Two times a day (BID) | ORAL | Status: DC | PRN

## 2016-03-15 NOTE — Progress Notes (Signed)
Location:      Place of Service:  SNF (31) Provider:  Toni Arthurs, NP-C  Henrine Screws, MD  Patient Care Team: Josetta Huddle, MD as PCP - General (Internal Medicine)  Extended Emergency Contact Information Primary Emergency Contact: [name] Address: [address]          [place], [place] [address] Johnnette Litter of [place] Phone: [phone] Mobile Phone: [phone] Relation: Daughter Secondary Emergency Contact: Meier,Christy Address: [address]          [place], [place] [address] Johnnette Litter of [place] Phone: [phone] Relation: Daughter  Code Status:  full Goals of care: Advanced Directive information Advanced Directives 03/06/2016  Does Patient Have a Medical Advance Directive? Yes  Type of Advance Directive Center  Does patient want to make changes to medical advance directive? No - Patient declined  Copy of Vian in Chart? Yes  Would patient like information on creating a medical advance directive? -     Chief Complaint  Patient presents with  . Acute Visit    HPI:  Pt is a 83 y.o. female seen today for an acute visit for nausea, vomiting and diarrhea. Pt has c/o sx present since last night. Pt has not been eating or drinking much. Pt also has a h/o anxiety. N/V could be related to anxiety. Pt is on antibiotics, including flagyl for diverticulitis. No fever. No maise. No muscle pain or weakness. No other complaints. VSS.   Past Medical History:  Diagnosis Date  . Arthritis   . Atherosclerosis of aorta ([place])   . CAD (coronary artery disease)    a. 10/27 PCI with DES to RCA, diffuse nonobstructive disease, EF 50-55%  . Chronic pain   . Diverticulosis   . Elevated transaminase level   . GERD (gastroesophageal reflux disease)   . Glaucoma   . Hiatal hernia   . Hypertension   . Lung nodules    Right  . MI (myocardial infarction) 01/09/2016  . Scoliosis   . UTI (urinary tract  infection)    refractory   Past Surgical History:  Procedure Laterality Date  . ABDOMINAL HYSTERECTOMY    . CARDIAC CATHETERIZATION N/A 01/09/2016   Procedure: Left Heart Cath and Coronary Angiography;  Surgeon: Leonie Man, MD;  Location: [place] [facility];  Service: Cardiovascular;  Laterality: N/A;  . CARDIAC CATHETERIZATION N/A 01/09/2016   Procedure: Coronary Stent Intervention;  Surgeon: Leonie Man, MD;  Location: [place] [facility];  Service: Cardiovascular;  Laterality: N/A;  . EYE SURGERY    . FRACTURE SURGERY     Tibia/fibula of right leg    Allergies  Allergen Reactions  . Penicillins Anaphylaxis    Has patient had a PCN reaction causing immediate rash, facial/tongue/throat swelling, SOB or lightheadedness with hypotension: Yes Has patient had a PCN reaction causing severe rash involving mucus membranes or skin necrosis: No Has patient had a PCN reaction that required hospitalization Yes Has patient had a PCN reaction occurring within the last 10 years: No If all of the above answers are "NO", then may proceed with Cephalosporin use.   Marland Kitchen Phenergan [Promethazine Hcl] Other (See Comments)    Restless legs    Allergies as of 03/11/2016      Reactions   Penicillins Anaphylaxis   Has patient had a PCN reaction causing immediate rash, facial/tongue/throat swelling, SOB or lightheadedness with hypotension: Yes Has patient had a PCN reaction causing severe rash involving mucus  membranes or skin necrosis: No Has patient had a PCN reaction that required hospitalization Yes Has patient had a PCN reaction occurring within the last 10 years: No If all of the above answers are "NO", then may proceed with Cephalosporin use.   Phenergan [promethazine Hcl] Other (See Comments)   Restless legs      Medication List       Accurate as of 03/11/16 11:59 PM. Always use your most recent med list.          acetaminophen 325 MG tablet Commonly known as:  TYLENOL Take 2  tablets (650 mg total) by mouth every 6 (six) hours as needed for mild pain (or Fever >/= 101).   ALPRAZolam 0.25 MG tablet Commonly known as:  XANAX Take 1 tablet (0.25 mg total) by mouth 2 (two) times daily as needed for anxiety.   aspirin 81 MG EC tablet Take 1 tablet (81 mg total) by mouth daily.   atorvastatin 40 MG tablet Commonly known as:  LIPITOR Take 1 tablet (40 mg total) by mouth daily at 6 PM.   bimatoprost 0.01 % Soln Commonly known as:  LUMIGAN Place 1 drop into the left eye at bedtime.   calcium-vitamin D 250-100 MG-UNIT tablet Take 1 tablet by mouth 2 (two) times daily.   celecoxib 200 MG capsule Commonly known as:  CELEBREX Take 200 mg by mouth daily.   citalopram 20 MG tablet Commonly known as:  CELEXA Take 20 mg by mouth every evening.   clopidogrel 75 MG tablet Commonly known as:  PLAVIX Take 1 tablet (75 mg total) by mouth daily.   docusate sodium 100 MG capsule Commonly known as:  COLACE Take 100 mg by mouth 2 (two) times daily.   dorzolamide 2 % ophthalmic solution Commonly known as:  TRUSOPT Place 1 drop into both eyes 2 (two) times daily.   feeding supplement (ENSURE ENLIVE) Liqd Take 237 mLs by mouth 2 (two) times daily between meals.   lansoprazole 30 MG capsule Commonly known as:  PREVACID Take 30 mg by mouth daily before breakfast.   levofloxacin 750 MG tablet Commonly known as:  LEVAQUIN Take 1 tablet (750 mg total) by mouth daily.   metroNIDAZOLE 500 MG tablet Commonly known as:  FLAGYL Take 1 tablet (500 mg total) by mouth every 8 (eight) hours.   ondansetron 4 MG disintegrating tablet Commonly known as:  ZOFRAN ODT Take 1 tablet (4 mg total) by mouth 4 (four) times daily.   ondansetron 4 MG disintegrating tablet Commonly known as:  ZOFRAN ODT Take 1 tablet (4 mg total) by mouth every 8 (eight) hours as needed for nausea or vomiting.   oxyCODONE 5 MG immediate release tablet Commonly known as:  Oxy IR/ROXICODONE Take 1  tablet (5 mg total) by mouth every 6 (six) hours as needed for severe pain (pain).   oxymorphone 10 MG T12a 12 hr tablet Commonly known as:  OPANA ER Take 1 tablet (10 mg total) by mouth every 12 (twelve) hours.   prednisoLONE acetate 1 % ophthalmic suspension Commonly known as:  PRED FORTE Place 1 drop into the right eye 2 (two) times daily.   senna 8.6 MG Tabs tablet Commonly known as:  SENOKOT Take 2 tablets (17.2 mg total) by mouth at bedtime as needed for mild constipation.   SYSTANE ULTRA 0.4-0.3 % Soln Generic drug:  Polyethyl Glycol-Propyl Glycol Place 1 drop into both eyes 2 (two) times daily.       Review of Systems  Constitutional: Negative for activity change, appetite change, chills, diaphoresis and fever.  HENT: Negative for congestion, sneezing, sore throat, trouble swallowing and voice change.   Eyes: Negative.   Respiratory: Negative for apnea, cough, choking, chest tightness, shortness of breath and wheezing.   Cardiovascular: Negative for chest pain, palpitations and leg swelling.  Gastrointestinal: Positive for diarrhea, nausea and vomiting. Negative for abdominal distention, abdominal pain and constipation.  Genitourinary: Negative for decreased urine volume, difficulty urinating, dysuria, flank pain, frequency, hematuria and urgency.  Musculoskeletal: Positive for arthralgias (typical arthritis). Negative for back pain, gait problem and myalgias.  Skin: Negative for color change, pallor, rash and wound.  Neurological: Negative for dizziness, tremors, syncope, speech difficulty, weakness, numbness and headaches.  Psychiatric/Behavioral: The patient is nervous/anxious.   All other systems reviewed and are negative.    There is no immunization history on file for this patient. Pertinent  Health Maintenance Due  Topic Date Due  . DEXA SCAN  12/06/1994  . PNA vac Low Risk Adult (1 of 2 - PCV13) 12/06/1994  . INFLUENZA VACCINE  10/14/2015   No flowsheet  data found. Functional Status Survey:    Vitals:   03/11/16 0430  BP: 127/64  Pulse: 89  Resp: 20  Temp: 97.5 F (36.4 C)  SpO2: 97%   There is no height or weight on file to calculate BMI. Physical Exam  Constitutional: She is oriented to person, place, and time. Vital signs are normal. She appears well-developed and well-nourished. She is active and cooperative. She does not appear ill. No distress.  HENT:  Head: Normocephalic and atraumatic.  Mouth/Throat: Uvula is midline, oropharynx is clear and moist and mucous membranes are normal. Mucous membranes are not pale, not dry and not cyanotic.  Eyes: Conjunctivae, EOM and lids are normal. Pupils are equal, round, and reactive to light.  Neck: Trachea normal, normal range of motion and full passive range of motion without pain. Neck supple. No JVD present. No tracheal deviation, no edema and no erythema present. No thyromegaly present.  Cardiovascular: Normal rate, regular rhythm, normal heart sounds, intact distal pulses and normal pulses.  Exam reveals no gallop, no distant heart sounds and no friction rub.   No murmur heard. Pulmonary/Chest: Effort normal and breath sounds normal. No accessory muscle usage. No respiratory distress. She has no decreased breath sounds. She has no wheezes. She has no rhonchi. She has no rales. She exhibits no tenderness.  Abdominal: Soft. Normal appearance. She exhibits no distension and no ascites. Bowel sounds are increased. There is no tenderness.  Musculoskeletal: Normal range of motion. She exhibits no edema or tenderness.  Expected osteoarthritis, stiffness  Neurological: She is alert and oriented to person, place, and time. She has normal strength.  Skin: Skin is warm, dry and intact. She is not diaphoretic. No cyanosis. No pallor. Nails show no clubbing.  Psychiatric: Her speech is normal and behavior is normal. Judgment and thought content normal. Her mood appears anxious. Cognition and memory  are normal.  Nursing note and vitals reviewed.   Labs reviewed:  Recent Labs  03/07/16 1715 03/11/16 1050 03/15/16 1237  NA 138 140 141  K 4.0 3.8 3.9  CL 111 108 110  CO2 _0 GLUCOSE 101* 136* 138*  BUN _1 CREATININE 0.80 1.02* 1.20*  CALCIUM 8.1* 8.8* 9.1    Recent Labs  03/07/16 1715 03/11/16 1050 03/15/16 1237  AST 54* 45* 34  ALT 50 34 25  ALKPHOS 69  66 64  BILITOT 0.3 0.6 0.3  PROT 4.8* 5.6* 6.2*  ALBUMIN 2.4* 3.0* 3.2*    Recent Labs  03/08/16 0336 03/11/16 1050 03/15/16 1237  WBC 6.9 8.8 9.4  NEUTROABS  --  7.1*  --   HGB 9.1* 10.8* 12.0  HCT 28.9* 32.7* 36.9  MCV 94.8 91.4 91.1  PLT 232 305 384   Lab Results  Component Value Date   TSH 1.467 01/09/2016   No results found for: HGBA1C Lab Results  Component Value Date   CHOL 148 01/09/2016   HDL 75 01/09/2016   LDLCALC 69 01/09/2016   TRIG 21 01/09/2016   CHOLHDL 2.0 01/09/2016    Significant Diagnostic Results in last 30 days:  Dg Chest 2 View  Result Date: 03/15/2016 CLINICAL DATA:  Increasing weakness.  Altered mental status. EXAM: CHEST  2 VIEW COMPARISON:  03/06/2016 FINDINGS: Cardiomegaly. The lungs are hyperinflated with flattening of the diaphragm consistent with COPD. Marked chronic accentuation of the thoracic kyphosis. Chronic peribronchial thickening and interstitial accentuation. Tiny new bilateral effusions. Chronic thoracolumbar scoliosis.  Right proximal humeral prosthesis. IMPRESSION: New tiny bilateral pleural effusions.  COPD.  Chronic cardiomegaly. Electronically Signed   By: Lorriane Shire M.D.   On: 03/15/2016 14:29   Dg Chest 2 View  Result Date: 03/06/2016 CLINICAL DATA:  Shortness of breath, diarrhea, weakness EXAM: CHEST  2 VIEW COMPARISON:  01/08/2016 FINDINGS: Chronic interstitial markings. No focal consolidation. No pleural effusion or pneumothorax. The heart is top-normal in size. Right shoulder arthroplasty. Degenerative changes of the left shoulder.  IMPRESSION: No evidence of acute cardiopulmonary disease. Electronically Signed   By: Julian Hy M.D.   On: 03/06/2016 13:00   Ct Abdomen Pelvis W Contrast  Result Date: 02/29/2016 CLINICAL DATA:  83 year old female with left lower quadrant abdominal pain. EXAM: CT ABDOMEN AND PELVIS WITH CONTRAST TECHNIQUE: Multidetector CT imaging of the abdomen and pelvis was performed using the standard protocol following bolus administration of intravenous contrast. CONTRAST:  161m ISOVUE-300 IOPAMIDOL (ISOVUE-300) INJECTION 61% COMPARISON:  None. FINDINGS: Lower chest: The visualized lung bases are clear. There is coronary vascular calcification. No intra-abdominal free air or free fluid Hepatobiliary: No focal liver abnormality is seen. No gallstones, gallbladder wall thickening, or biliary dilatation. Pancreas: Unremarkable. No pancreatic ductal dilatation or surrounding inflammatory changes. Spleen: Normal in size without focal abnormality. Adrenals/Urinary Tract: The adrenal glands, kidneys, visualized ureters, and urinary bladder appear unremarkable. Stomach/Bowel: There is extensive sigmoid and descending colon diverticulosis with muscular hypertrophy. Mild pericolonic haziness along the descending colon may be related to chronic inflammation/ scarring, mild acute diverticulitis is not entirely excluded. Clinical correlation is recommended. There is no evidence of diverticular perforation or abscess. Moderate stool noted in the proximal colon. No evidence of bowel obstruction. Normal appendix. Vascular/Lymphatic: There is advanced aortoiliac atherosclerotic disease. No aneurysmal dilatation or evidence of dissection. The origins of the celiac axis is patent. There is chronic appearing thrombus or noncalcified plaque at the origin of the SMA with focal narrowing of the vessel. The SMA however remains patent. The IMA is patent as well. The SMV, splenic vein, and main portal veins are patent. No portal venous  gas identified. There is no adenopathy. Reproductive: Hysterectomy.  A pessary is noted within the pelvis. Other: None Musculoskeletal: Osteopenia with scoliosis and degenerative changes of the spine. Multilevel disc desiccation with vacuum phenomena. No acute fracture. IMPRESSION: Extensive sigmoid and descending colonic diverticulosis with muscular hypertrophy and pericolonic scarring. Mild acute diverticulitis is not entirely excluded.  Clinical correlation is recommended. No bowel obstruction. Normal appendix. Electronically Signed   By: Anner Crete M.D.   On: 02/29/2016 04:16    Assessment/Plan 1. Nausea and vomiting, intractability of vomiting not specified, unspecified vomiting type  Continue to encourage po fluid intake  Monitor urine output  Continue zofran 4 mg po Q 6 hours prn n/v  Imodium per standing orders prn for diarrhea  Lorazepam 0.5 mg prn for anxiety, nausea  Family/ staff Communication:   Total Time:  Documentation:  Face to Face:  Family/Phone:   Labs/tests ordered:  Cbc, met c, flu swab  Medication list reviewed and assessed for continued appropriateness.  Vikki Ports, NP-C Geriatrics [facility] [phone], [place] [address] Cell Phone (Mon-Fri 8am-5pm):  [phone] On Call:  [phone] & follow prompts after 5pm & weekends Office Phone:  [phone] Office Fax:  [phone]

## 2016-03-15 NOTE — ED Notes (Signed)
Pt's knees re-arranged and blankets placed under knees for comfort. Pt states "That feels better now" Pt still asking for pain medicine.

## 2016-03-15 NOTE — ED Provider Notes (Addendum)
[facility]-EMERGENCY DEPT Provider Note   CSN: [id number] Arrival date & time: 03/15/16  1150     History   Chief Complaint Chief Complaint  Patient presents with  . Weakness    HPI Alexis Yates is a 83 y.o. female.  Pt presents to the ED today with confusion and weakness.  The pt has had multiple admissions recently for diverticulitis and UTIs.  The pt was just admitted from 12/23-26 for diverticulitis.  She was discharged to rehab from the hospital.  Her daughter said she was ok for the first day or 2 after discharge, but since then; pt has been getting more confused and weak.  Pt has not been eating or drinking much.      Past Medical History:  Diagnosis Date  . Arthritis   . Atherosclerosis of aorta (HCC)   . CAD (coronary artery disease)    a. 10/27 PCI with DES to RCA, diffuse nonobstructive disease, EF 50-55%  . Chronic pain   . Diverticulosis   . Elevated transaminase level   . GERD (gastroesophageal reflux disease)   . Glaucoma   . Hiatal hernia   . Hypertension   . Lung nodules    Right  . MI (myocardial infarction) 01/09/2016  . Scoliosis   . UTI (urinary tract infection)    refractory    Patient Active Problem List   Diagnosis Date Noted  . Abdominal pain 03/07/2016  . Anemia 03/06/2016  . Diarrhea 03/06/2016  . Generalized weakness 03/06/2016  . Elevated transaminase level 03/06/2016  . Chronic pain   . Diverticulitis 02/29/2016  . ST elevation myocardial infarction (STEMI) of inferior wall, initial episode of care (HCC) 01/09/2016  . Urinary tract infection without hematuria   . NSTEMI (non-ST elevated myocardial infarction) (HCC) 01/08/2016  . UTI (urinary tract infection) 05/27/2014  . PID (acute pelvic inflammatory disease) 05/27/2014  . Vaginal inflammation from pessary (HCC) 05/27/2014  . Leukocytosis 05/27/2014  . Nausea and vomiting 05/27/2014  . Hypertension   . Hiatal hernia   . Lung nodules   . Glaucoma   . GERD (gastroesophageal  reflux disease)     Past Surgical History:  Procedure Laterality Date  . ABDOMINAL HYSTERECTOMY    . CARDIAC CATHETERIZATION N/A 01/09/2016   Procedure: Left Heart Cath and Coronary Angiography;  Surgeon: Marykay Lex, MD;  Location: [facility] INVASIVE CV LAB;  Service: Cardiovascular;  Laterality: N/A;  . CARDIAC CATHETERIZATION N/A 01/09/2016   Procedure: Coronary Stent Intervention;  Surgeon: Marykay Lex, MD;  Location: [facility] INVASIVE CV LAB;  Service: Cardiovascular;  Laterality: N/A;  . EYE SURGERY    . FRACTURE SURGERY     Tibia/fibula of right leg    OB History    No data available       Home Medications    Prior to Admission medications   Medication Sig Start Date End Date Taking? Authorizing Provider  acetaminophen (TYLENOL) 325 MG tablet Take 2 tablets (650 mg total) by mouth every 6 (six) hours as needed for mild pain (or Fever >/= 101). Patient taking differently: Take 650 mg by mouth every 6 (six) hours as needed for mild pain (or fever of >101).  03/09/16  Yes Calvert Cantor, MD  aspirin EC 81 MG EC tablet Take 1 tablet (81 mg total) by mouth daily. 01/14/16  Yes Arty Baumgartner, NP  atorvastatin (LIPITOR) 40 MG tablet Take 1 tablet (40 mg total) by mouth daily at 6 PM. Patient taking differently: Take 40  mg by mouth every evening.  01/13/16  Yes Arty Baumgartner, NP  bimatoprost (LUMIGAN) 0.01 % SOLN Place 1 drop into the left eye at bedtime.    Yes Historical Provider, MD  calcium-vitamin D 250-100 MG-UNIT per tablet Take 1 tablet by mouth 2 (two) times daily.   Yes Historical Provider, MD  celecoxib (CELEBREX) 200 MG capsule Take 200 mg by mouth daily.    Yes Historical Provider, MD  Cholecalciferol (VITAMIN D3) 1000 units CAPS Take 1,000 Units by mouth every morning.   Yes Historical Provider, MD  citalopram (CELEXA) 20 MG tablet Take 20 mg by mouth every evening.    Yes Historical Provider, MD  clonazePAM (KLONOPIN) 1 MG tablet Take 0.5-1 mg by mouth every 12  (twelve) hours as needed for anxiety (or restless legs).  03/13/16  Yes Historical Provider, MD  clopidogrel (PLAVIX) 75 MG tablet Take 1 tablet (75 mg total) by mouth daily. 01/14/16  Yes Arty Baumgartner, NP  docusate sodium (COLACE) 100 MG capsule Take 100 mg by mouth 2 (two) times daily.    Yes Historical Provider, MD  dorzolamide (TRUSOPT) 2 % ophthalmic solution Place 1 drop into both eyes 2 (two) times daily.   Yes Historical Provider, MD  lansoprazole (PREVACID) 30 MG capsule Take 30 mg by mouth daily before breakfast.   Yes Historical Provider, MD  lisinopril (PRINIVIL,ZESTRIL) 10 MG tablet Take 10 mg by mouth daily.   Yes Historical Provider, MD  ondansetron (ZOFRAN ODT) 4 MG disintegrating tablet Take 1 tablet (4 mg total) by mouth every 8 (eight) hours as needed for nausea or vomiting. 03/09/16  Yes Calvert Cantor, MD  oxyCODONE (OXY IR/ROXICODONE) 5 MG immediate release tablet Take 1 tablet (5 mg total) by mouth every 6 (six) hours as needed for severe pain (pain). Patient taking differently: Take 5 mg by mouth every 8 (eight) hours as needed for severe pain (for breakthrough pain).  03/09/16  Yes Calvert Cantor, MD  oxymorphone 10 MG PO T12A 12 hr tablet Take 1 tablet (10 mg total) by mouth every 12 (twelve) hours. 03/09/16  Yes Calvert Cantor, MD  Polyethyl Glycol-Propyl Glycol (SYSTANE ULTRA) 0.4-0.3 % SOLN Place 1 drop into both eyes 2 (two) times daily.   Yes Historical Provider, MD  prednisoLONE acetate (PRED FORTE) 1 % ophthalmic suspension Place 1 drop into the right eye 2 (two) times daily.  01/08/15  Yes Historical Provider, MD  rOPINIRole (REQUIP) 0.25 MG tablet Take 0.25 mg by mouth daily as needed (for restless legs).   Yes Historical Provider, MD  senna (SENOKOT) 8.6 MG TABS tablet Take 2 tablets (17.2 mg total) by mouth at bedtime as needed for mild constipation. 03/04/16  Yes Clanford Cyndie Mull, MD  ALPRAZolam (XANAX) 0.25 MG tablet Take 1 tablet (0.25 mg total) by mouth 2 (two)  times daily as needed for anxiety. Patient not taking: Reported on 03/15/2016 03/09/16   Calvert Cantor, MD  feeding supplement, ENSURE ENLIVE, (ENSURE ENLIVE) LIQD Take 237 mLs by mouth 2 (two) times daily between meals. Patient not taking: Reported on 03/15/2016 03/04/16   Clanford Cyndie Mull, MD    Family History Family History  Problem Relation Age of Onset  . Cancer Brother     Stomach  . Heart disease Father     Died age 102  . Diabetes Mother   . Heart failure Mother     Died 52    Social History Social History  Substance Use Topics  . Smoking  status: Former Games developer  . Smokeless tobacco: Never Used  . Alcohol use No     Allergies   Penicillins and Phenergan [promethazine hcl]   Review of Systems Review of Systems  Constitutional: Positive for fatigue.  Gastrointestinal: Positive for abdominal pain.  Neurological: Positive for weakness.  All other systems reviewed and are negative.    Physical Exam Updated Vital Signs BP 156/91   Pulse 90   Temp 98.3 F (36.8 C) (Rectal)   Resp 18   SpO2 95%   Physical Exam  Constitutional: She is oriented to person, place, and time. She appears well-developed and well-nourished.  HENT:  Head: Normocephalic and atraumatic.  Right Ear: External ear normal.  Left Ear: External ear normal.  Nose: Nose normal.  Mouth/Throat: Mucous membranes are dry.  Eyes: Conjunctivae and EOM are normal. Pupils are equal, round, and reactive to light.  Neck: Normal range of motion. Neck supple.  Cardiovascular: Regular rhythm, normal heart sounds and intact distal pulses.  Tachycardia present.   Pulmonary/Chest: Effort normal and breath sounds normal.  Abdominal: Soft. Bowel sounds are normal. There is tenderness in the left lower quadrant.  Musculoskeletal: Normal range of motion.  Neurological: She is alert and oriented to person, place, and time.  Skin: Skin is warm and dry.  Psychiatric: She has a normal mood and affect. Her behavior is  normal. Judgment and thought content normal.  Nursing note and vitals reviewed.    ED Treatments / Results  Labs (all labs ordered are listed, but only abnormal results are displayed) Labs Reviewed  BASIC METABOLIC PANEL - Abnormal; Notable for the following:       Result Value   Glucose, Bld 138 (*)    Creatinine, Ser 1.20 (*)    GFR calc non Af Amer 40 (*)    GFR calc Af Amer 46 (*)    All other components within normal limits  HEPATIC FUNCTION PANEL - Abnormal; Notable for the following:    Total Protein 6.2 (*)    Albumin 3.2 (*)    Bilirubin, Direct <0.1 (*)    All other components within normal limits  URINALYSIS, ROUTINE W REFLEX MICROSCOPIC - Abnormal; Notable for the following:    Leukocytes, UA SMALL (*)    Bacteria, UA RARE (*)    Squamous Epithelial / LPF 0-5 (*)    All other components within normal limits  CBC    EKG  EKG Interpretation  Date/Time:  Monday March 15 2016 14:07:10 EST Ventricular Rate:  81 PR Interval:    QRS Duration: 83 QT Interval:  390 QTC Calculation: 453 R Axis:   -14 Text Interpretation:  Sinus rhythm Consider left ventricular hypertrophy Borderline T abnormalities, inferior leads Baseline wander in lead(s) V6 Confirmed by Particia Nearing MD, Stephnie Parlier ([id number]) on 03/15/2016 2:18:19 PM       Radiology Dg Chest 2 View  Result Date: 03/15/2016 CLINICAL DATA:  Increasing weakness.  Altered mental status. EXAM: CHEST  2 VIEW COMPARISON:  03/06/2016 FINDINGS: Cardiomegaly. The lungs are hyperinflated with flattening of the diaphragm consistent with COPD. Marked chronic accentuation of the thoracic kyphosis. Chronic peribronchial thickening and interstitial accentuation. Tiny new bilateral effusions. Chronic thoracolumbar scoliosis.  Right proximal humeral prosthesis. IMPRESSION: New tiny bilateral pleural effusions.  COPD.  Chronic cardiomegaly. Electronically Signed   By: Francene Boyers M.D.   On: 03/15/2016 14:29    Procedures Procedures  (including critical care time)  Medications Ordered in ED Medications  sodium chloride 0.9 % bolus  1,000 mL (not administered)    And  0.9 %  sodium chloride infusion (not administered)  acetaminophen (TYLENOL) tablet 650 mg (not administered)  aspirin EC tablet 81 mg (not administered)  atorvastatin (LIPITOR) tablet 40 mg (not administered)  latanoprost (XALATAN) 0.005 % ophthalmic solution 1 drop (not administered)  calcium-vitamin D 250-100 MG-UNIT per tablet 1 tablet (not administered)  celecoxib (CELEBREX) capsule 200 mg (not administered)  Vitamin D3 CAPS 1,000 Units (not administered)  citalopram (CELEXA) tablet 20 mg (not administered)  clonazePAM (KLONOPIN) tablet 0.5-1 mg (not administered)  clopidogrel (PLAVIX) tablet 75 mg (not administered)  docusate sodium (COLACE) capsule 100 mg (not administered)  dorzolamide (TRUSOPT) 2 % ophthalmic solution 1 drop (not administered)  feeding supplement (ENSURE ENLIVE) (ENSURE ENLIVE) liquid 237 mL (not administered)  pantoprazole (PROTONIX) EC tablet 20 mg (not administered)  lisinopril (PRINIVIL,ZESTRIL) tablet 10 mg (not administered)  ondansetron (ZOFRAN-ODT) disintegrating tablet 4 mg (not administered)  oxyCODONE (Oxy IR/ROXICODONE) immediate release tablet 5 mg (not administered)  morphine (MS CONTIN) 12 hr tablet 30 mg (not administered)  Polyethyl Glycol-Propyl Glycol 0.4-0.3 % SOLN 1 drop (not administered)  prednisoLONE acetate (PRED FORTE) 1 % ophthalmic suspension 1 drop (not administered)  rOPINIRole (REQUIP) tablet 0.25 mg (not administered)  senna (SENOKOT) tablet 17.2 mg (not administered)  HYDROcodone-acetaminophen (NORCO/VICODIN) 5-325 MG per tablet 1 tablet (1 tablet Oral Given 03/15/16 1625)     Initial Impression / Assessment and Plan / ED Course  I have reviewed the triage vital signs and the nursing notes.  Pertinent labs & imaging results that were available during my care of the patient were reviewed by me  and considered in my medical decision making (see chart for details).  Clinical Course     Pt does not meet admission criteria.  She and daughter do not want to to go back to her current SNF.  Her daughter said the nursing home gave her an extra dose of her clonidine yesterday.  They have also been not making her do her therapy, so she has not been doing it.    I tried to contact the SW before they left for the day, but did not receive a call back.  As the pt is refusing to go back to SNF, I will put her in pod C for observation until the morning when SW can see her and see if there is another facility for her to go to.  I will reorder a PT consult as insurance may require it.  Shortly after I finished putting all the orders in for ED observation, the family changed their minds and want to take patient home.  Pt given a rx for walker.  The pt will be d/c.  Final Clinical Impressions(s) / ED Diagnoses   Final diagnoses:  Weakness    New Prescriptions New Prescriptions   No medications on file     Jacalyn Lefevre, MD 03/15/16 1647    Jacalyn Lefevre, MD 03/15/16 [id number]

## 2016-03-15 NOTE — ED Triage Notes (Signed)
To ED for eval of increased weakness over the past month. Per family pt was recently discharged from [facility] to rehab. Since being in rehab pt has become weaker and weaker. Now unable to walk. Being treated for UTI

## 2016-03-15 NOTE — ED Notes (Signed)
MD ordered to hold off on IV insertion.

## 2016-03-27 ENCOUNTER — Encounter (HOSPITAL_COMMUNITY): Payer: Self-pay

## 2016-03-27 ENCOUNTER — Emergency Department (HOSPITAL_COMMUNITY)
Admission: EM | Admit: 2016-03-27 | Discharge: 2016-03-28 | Disposition: A | Discharge status: Home / Self Care | Payer: Medicare Other | Attending: Emergency Medicine | Admitting: Emergency Medicine

## 2016-03-27 DIAGNOSIS — I10 Essential (primary) hypertension: Secondary | ICD-10-CM | POA: Insufficient documentation

## 2016-03-27 DIAGNOSIS — Z87891 Personal history of nicotine dependence: Secondary | ICD-10-CM | POA: Insufficient documentation

## 2016-03-27 DIAGNOSIS — R627 Adult failure to thrive: Secondary | ICD-10-CM | POA: Insufficient documentation

## 2016-03-27 DIAGNOSIS — I2510 Atherosclerotic heart disease of native coronary artery without angina pectoris: Secondary | ICD-10-CM | POA: Diagnosis not present

## 2016-03-27 DIAGNOSIS — Z7982 Long term (current) use of aspirin: Secondary | ICD-10-CM | POA: Diagnosis not present

## 2016-03-27 DIAGNOSIS — Z79899 Other long term (current) drug therapy: Secondary | ICD-10-CM | POA: Insufficient documentation

## 2016-03-27 DIAGNOSIS — E860 Dehydration: Secondary | ICD-10-CM | POA: Insufficient documentation

## 2016-03-27 DIAGNOSIS — I252 Old myocardial infarction: Secondary | ICD-10-CM | POA: Insufficient documentation

## 2016-03-27 DIAGNOSIS — K5641 Fecal impaction: Secondary | ICD-10-CM | POA: Insufficient documentation

## 2016-03-27 DIAGNOSIS — R112 Nausea with vomiting, unspecified: Secondary | ICD-10-CM | POA: Diagnosis present

## 2016-03-27 DIAGNOSIS — R339 Retention of urine, unspecified: Secondary | ICD-10-CM | POA: Diagnosis not present

## 2016-03-27 NOTE — ED Notes (Signed)
Pt unable to void at this time. 

## 2016-03-27 NOTE — ED Triage Notes (Signed)
Pt was recently discharged from rehab a week and a half ago, per family she has not voided in the past 12 hours, pt c/o of dysuria and also c/o of hemmhroid pain.

## 2016-03-27 NOTE — ED Notes (Signed)
Pt bladder scanned in triage 243 ml

## 2016-03-28 ENCOUNTER — Emergency Department (HOSPITAL_COMMUNITY): Payer: Medicare Other

## 2016-03-28 LAB — I-STAT CHEM 8, ED
BUN: 37 mg/dL — ABNORMAL HIGH (ref 6–20)
CALCIUM ION: 1.22 mmol/L (ref 1.15–1.40)
CHLORIDE: 100 mmol/L — AB (ref 101–111)
Creatinine, Ser: 1.4 mg/dL — ABNORMAL HIGH (ref 0.44–1.00)
GLUCOSE: 123 mg/dL — AB (ref 65–99)
HCT: 42 % (ref 36.0–46.0)
Hemoglobin: 14.3 g/dL (ref 12.0–15.0)
POTASSIUM: 4.3 mmol/L (ref 3.5–5.1)
Sodium: 136 mmol/L (ref 135–145)
TCO2: 27 mmol/L (ref 0–100)

## 2016-03-28 LAB — COMPREHENSIVE METABOLIC PANEL
ALK PHOS: 77 U/L (ref 38–126)
ALT: 24 U/L (ref 14–54)
ANION GAP: 7 (ref 5–15)
AST: 30 U/L (ref 15–41)
Albumin: 3.7 g/dL (ref 3.5–5.0)
BILIRUBIN TOTAL: 0.8 mg/dL (ref 0.3–1.2)
BUN: 38 mg/dL — ABNORMAL HIGH (ref 6–20)
CALCIUM: 9.7 mg/dL (ref 8.9–10.3)
CO2: 26 mmol/L (ref 22–32)
CREATININE: 1.2 mg/dL — AB (ref 0.44–1.00)
Chloride: 102 mmol/L (ref 101–111)
GFR, EST AFRICAN AMERICAN: 46 mL/min — AB (ref >60)
GFR, EST NON AFRICAN AMERICAN: 40 mL/min — AB (ref >60)
Glucose, Bld: 127 mg/dL — ABNORMAL HIGH (ref 65–99)
Potassium: 4.4 mmol/L (ref 3.5–5.1)
Sodium: 135 mmol/L (ref 135–145)
Total Protein: 6.9 g/dL (ref 6.5–8.1)

## 2016-03-28 LAB — CBC WITH DIFFERENTIAL/PLATELET
Basophils Absolute: 0 10*3/uL (ref 0.0–0.1)
Basophils Relative: 0 %
EOS ABS: 0.2 10*3/uL (ref 0.0–0.7)
EOS PCT: 1 %
HCT: 39.7 % (ref 36.0–46.0)
HEMOGLOBIN: 12.9 g/dL (ref 12.0–15.0)
LYMPHS ABS: 1.8 10*3/uL (ref 0.7–4.0)
Lymphocytes Relative: 12 %
MCH: 29.9 pg (ref 26.0–34.0)
MCHC: 32.5 g/dL (ref 30.0–36.0)
MCV: 92.1 fL (ref 78.0–100.0)
MONOS PCT: 10 %
Monocytes Absolute: 1.6 10*3/uL — ABNORMAL HIGH (ref 0.1–1.0)
NEUTROS PCT: 77 %
Neutro Abs: 11.9 10*3/uL — ABNORMAL HIGH (ref 1.7–7.7)
Platelets: 317 10*3/uL (ref 150–400)
RBC: 4.31 MIL/uL (ref 3.87–5.11)
RDW: 14.8 % (ref 11.5–15.5)
WBC: 15.5 10*3/uL — AB (ref 4.0–10.5)

## 2016-03-28 LAB — URINALYSIS, ROUTINE W REFLEX MICROSCOPIC
BACTERIA UA: NONE SEEN
Bilirubin Urine: NEGATIVE
GLUCOSE, UA: NEGATIVE mg/dL
Hgb urine dipstick: NEGATIVE
Ketones, ur: NEGATIVE mg/dL
NITRITE: NEGATIVE
PH: 6 (ref 5.0–8.0)
PROTEIN: NEGATIVE mg/dL
SPECIFIC GRAVITY, URINE: 1.021 (ref 1.005–1.030)

## 2016-03-28 MED ORDER — SODIUM CHLORIDE 0.9 % IV BOLUS (SEPSIS)
1000.0000 mL | Freq: Once | INTRAVENOUS | Status: AC
  Administered 2016-03-28: 1000 mL via INTRAVENOUS

## 2016-03-28 MED ORDER — METOCLOPRAMIDE HCL 10 MG PO TABS: 10.0000 mg | ORAL_TABLET | Freq: Four times a day (QID) | ORAL | 0 refills | Status: DC

## 2016-03-28 MED ORDER — ALPRAZOLAM 0.25 MG PO TABS
0.5000 mg | ORAL_TABLET | Freq: Once | ORAL | Status: AC
  Administered 2016-03-28: 0.5 mg via ORAL
  Filled 2016-03-28: qty 2

## 2016-03-28 NOTE — ED Notes (Signed)
pts family expressed discomfort with taking the patient home. Dr. Adela Lank notified

## 2016-03-28 NOTE — ED Provider Notes (Signed)
[facility]-EMERGENCY DEPT Provider Note   CSN: [id number] Arrival date & time: 03/27/16  2228  By signing my name below, I, Alexis Yates, attest that this documentation has been prepared under the direction and in the presence of physician practitioner, Melene Plan, DO. Electronically Signed: Linna Yates, Scribe. 03/28/2016. 12:26 AM.  History   Chief Complaint Chief Complaint  Patient presents with  . Urinary Retention    The history is provided by a relative and the patient. No language interpreter was used.  Illness  This is a new problem. The current episode started 12 to 24 hours ago. The problem occurs constantly. The problem has not changed since onset.Pertinent negatives include no chest pain, no abdominal pain, no headaches and no shortness of breath. Nothing aggravates the symptoms. Nothing relieves the symptoms.     HPI Comments: Alexis Yates is a 83 y.o. female brought in by family who presents to the Emergency Department complaining of constant difficulty urinating for the last 13 hours. Daughter reports associated dysuria and confusion/disorientation. Daughter notes patient has a h/o recurrent UTI's and notes pt has never been disoriented with a UTI in the past. Daughter also notes patient has had rectal pain along with nausea, vomiting, decreased appetite, decreased fluid intake, and generalized weakness for 2-3 days. Pt reports exacerbation of her rectal pain with sitting down. Daughter notes patient had a heart attack several months ago and has had frequent UTI's since. Daughter notes that patient is allergic to many antibiotics and cannot tolerate them orally. Per daughter, pt denies fever, chills, or any other associated symptoms.  Past Medical History:  Diagnosis Date  . Arthritis   . Atherosclerosis of aorta (HCC)   . CAD (coronary artery disease)    a. 10/27 PCI with DES to RCA, diffuse nonobstructive disease, EF 50-55%  . Chronic pain   . Diverticulosis   .  Elevated transaminase level   . GERD (gastroesophageal reflux disease)   . Glaucoma   . Hiatal hernia   . Hypertension   . Lung nodules    Right  . MI (myocardial infarction) 01/09/2016  . Scoliosis   . UTI (urinary tract infection)    refractory    Patient Active Problem List   Diagnosis Date Noted  . Abdominal pain 03/07/2016  . Anemia 03/06/2016  . Diarrhea 03/06/2016  . Generalized weakness 03/06/2016  . Elevated transaminase level 03/06/2016  . Chronic pain   . Diverticulitis 02/29/2016  . ST elevation myocardial infarction (STEMI) of inferior wall, initial episode of care (HCC) 01/09/2016  . Urinary tract infection without hematuria   . NSTEMI (non-ST elevated myocardial infarction) (HCC) 01/08/2016  . UTI (urinary tract infection) 05/27/2014  . PID (acute pelvic inflammatory disease) 05/27/2014  . Vaginal inflammation from pessary (HCC) 05/27/2014  . Leukocytosis 05/27/2014  . Nausea and vomiting 05/27/2014  . Hypertension   . Hiatal hernia   . Lung nodules   . Glaucoma   . GERD (gastroesophageal reflux disease)     Past Surgical History:  Procedure Laterality Date  . ABDOMINAL HYSTERECTOMY    . CARDIAC CATHETERIZATION N/A 01/09/2016   Procedure: Left Heart Cath and Coronary Angiography;  Surgeon: Marykay Lex, MD;  Location: [facility] INVASIVE CV LAB;  Service: Cardiovascular;  Laterality: N/A;  . CARDIAC CATHETERIZATION N/A 01/09/2016   Procedure: Coronary Stent Intervention;  Surgeon: Marykay Lex, MD;  Location: [facility] INVASIVE CV LAB;  Service: Cardiovascular;  Laterality: N/A;  . EYE SURGERY    . FRACTURE SURGERY  Tibia/fibula of right leg    OB History    No data available       Home Medications    Prior to Admission medications   Medication Sig Start Date End Date Taking? Authorizing Provider  acetaminophen (TYLENOL) 325 MG tablet Take 2 tablets (650 mg total) by mouth every 6 (six) hours as needed for mild pain (or Fever >/= 101). Patient  taking differently: Take 650 mg by mouth every 6 (six) hours as needed for mild pain (or fever of >101).  03/09/16  Yes Calvert Cantor, MD  ALPRAZolam Prudy Feeler) 0.25 MG tablet Take 1 tablet (0.25 mg total) by mouth 2 (two) times daily as needed for anxiety. 03/09/16  Yes Calvert Cantor, MD  aspirin EC 81 MG EC tablet Take 1 tablet (81 mg total) by mouth daily. 01/14/16  Yes Arty Baumgartner, NP  atorvastatin (LIPITOR) 40 MG tablet Take 1 tablet (40 mg total) by mouth daily at 6 PM. Patient taking differently: Take 40 mg by mouth every evening.  01/13/16  Yes Arty Baumgartner, NP  bimatoprost (LUMIGAN) 0.01 % SOLN Place 1 drop into the left eye at bedtime.    Yes Historical Provider, MD  calcium-vitamin D 250-100 MG-UNIT per tablet Take 1 tablet by mouth 2 (two) times daily.   Yes Historical Provider, MD  celecoxib (CELEBREX) 200 MG capsule Take 200 mg by mouth daily.    Yes Historical Provider, MD  Cholecalciferol (VITAMIN D3) 1000 units CAPS Take 1,000 Units by mouth every morning.   Yes Historical Provider, MD  clopidogrel (PLAVIX) 75 MG tablet Take 1 tablet (75 mg total) by mouth daily. 01/14/16  Yes Arty Baumgartner, NP  docusate sodium (COLACE) 100 MG capsule Take 100 mg by mouth 2 (two) times daily.    Yes Historical Provider, MD  dorzolamide (TRUSOPT) 2 % ophthalmic solution Place 1 drop into both eyes 2 (two) times daily.   Yes Historical Provider, MD  esomeprazole (NEXIUM) 40 MG capsule Take 40 mg by mouth daily. 03/19/16  Yes Historical Provider, MD  lisinopril (PRINIVIL,ZESTRIL) 10 MG tablet Take 10 mg by mouth daily.   Yes Historical Provider, MD  ondansetron (ZOFRAN ODT) 4 MG disintegrating tablet Take 1 tablet (4 mg total) by mouth every 8 (eight) hours as needed for nausea or vomiting. 03/09/16  Yes Calvert Cantor, MD  oxyCODONE (OXY IR/ROXICODONE) 5 MG immediate release tablet Take 1 tablet (5 mg total) by mouth every 6 (six) hours as needed for severe pain (pain). Patient taking differently:  Take 5 mg by mouth every 8 (eight) hours as needed for severe pain (for breakthrough pain).  03/09/16  Yes Calvert Cantor, MD  oxymorphone 10 MG PO T12A 12 hr tablet Take 1 tablet (10 mg total) by mouth every 12 (twelve) hours. 03/09/16  Yes Calvert Cantor, MD  Polyethyl Glycol-Propyl Glycol (SYSTANE ULTRA) 0.4-0.3 % SOLN Place 1 drop into both eyes 2 (two) times daily.   Yes Historical Provider, MD  pramipexole (MIRAPEX) 0.25 MG tablet Take 0.25 mg by mouth at bedtime. 03/19/16  Yes Historical Provider, MD  prednisoLONE acetate (PRED FORTE) 1 % ophthalmic suspension Place 1 drop into the right eye 2 (two) times daily.  01/08/15  Yes Historical Provider, MD  rOPINIRole (REQUIP) 0.25 MG tablet Take 0.25 mg by mouth daily as needed (for restless legs).   Yes Historical Provider, MD  senna (SENOKOT) 8.6 MG TABS tablet Take 2 tablets (17.2 mg total) by mouth at bedtime as needed for mild  constipation. 03/04/16  Yes Clanford Cyndie Mull, MD  venlafaxine XR (EFFEXOR-XR) 75 MG 24 hr capsule Take 75 mg by mouth daily. 03/19/16  Yes Historical Provider, MD  feeding supplement, ENSURE ENLIVE, (ENSURE ENLIVE) LIQD Take 237 mLs by mouth 2 (two) times daily between meals. Patient not taking: Reported on 03/28/2016 03/04/16   Clanford Cyndie Mull, MD  metoCLOPramide (REGLAN) 10 MG tablet Take 1 tablet (10 mg total) by mouth every 6 (six) hours. 03/28/16   Melene Plan, DO    Family History Family History  Problem Relation Age of Onset  . Cancer Brother     Stomach  . Heart disease Father     Died age 62  . Diabetes Mother   . Heart failure Mother     Died 41    Social History Social History  Substance Use Topics  . Smoking status: Former Games developer  . Smokeless tobacco: Never Used  . Alcohol use No     Allergies   Penicillins and Phenergan [promethazine hcl]   Review of Systems Review of Systems  Constitutional: Positive for appetite change (decreased). Negative for chills and fever.  Respiratory: Negative for  shortness of breath.   Cardiovascular: Negative for chest pain.  Gastrointestinal: Positive for nausea, rectal pain and vomiting. Negative for abdominal pain.  Genitourinary: Positive for difficulty urinating and dysuria.  Neurological: Positive for weakness (generalized). Negative for syncope and headaches.  Psychiatric/Behavioral: Positive for confusion.  All other systems reviewed and are negative.    Physical Exam Updated Vital Signs BP 121/67 (BP Location: Left Arm)   Pulse 80   Temp 97.5 F (36.4 C) (Oral)   Resp 21   Ht 5' (1.524 m)   Wt 110 lb (49.9 kg)   SpO2 95%   BMI 21.48 kg/m   Physical Exam  Constitutional: She is oriented to person, place, and time. She appears well-developed and well-nourished. She appears cachectic. No distress.  HENT:  Head: Normocephalic and atraumatic.  Eyes: EOM are normal. Pupils are equal, round, and reactive to light.  Neck: Normal range of motion. Neck supple.  Cardiovascular: Normal rate and regular rhythm.  Exam reveals no gallop and no friction rub.   No murmur heard. Pulmonary/Chest: Effort normal. She has no wheezes. She has no rales.  Abdominal: Soft. She exhibits no distension. There is no tenderness.  Genitourinary: Rectal exam shows tenderness. Rectal exam shows no external hemorrhoid, no internal hemorrhoid and no mass.  Genitourinary Comments: Erythema surrounding rectum. Rectum is mildly TTP. No noted hemorrhoids or masses. Soft stool in the vault.  Musculoskeletal: She exhibits no edema or tenderness.  Neurological: She is alert and oriented to person, place, and time.  Skin: Skin is warm and dry. She is not diaphoretic.  Psychiatric: She has a normal mood and affect. Her behavior is normal.  Nursing note and vitals reviewed.    ED Treatments / Results  Labs (all labs ordered are listed, but only abnormal results are displayed) Labs Reviewed  CBC WITH DIFFERENTIAL/PLATELET - Abnormal; Notable for the following:         Result Value   WBC 15.5 (*)    Neutro Abs 11.9 (*)    Monocytes Absolute 1.6 (*)    All other components within normal limits  COMPREHENSIVE METABOLIC PANEL - Abnormal; Notable for the following:    Glucose, Bld 127 (*)    BUN 38 (*)    Creatinine, Ser 1.20 (*)    GFR calc non Af Amer 40 (*)  GFR calc Af Amer 46 (*)    All other components within normal limits  URINALYSIS, ROUTINE W REFLEX MICROSCOPIC - Abnormal; Notable for the following:    APPearance HAZY (*)    Leukocytes, UA LARGE (*)    Squamous Epithelial / LPF 0-5 (*)    All other components within normal limits  I-STAT CHEM 8, ED - Abnormal; Notable for the following:    Chloride 100 (*)    BUN 37 (*)    Creatinine, Ser 1.40 (*)    Glucose, Bld 123 (*)    All other components within normal limits  URINALYSIS, ROUTINE W REFLEX MICROSCOPIC    EKG  EKG Interpretation None       Radiology Dg Chest 2 View  Result Date: 03/28/2016 CLINICAL DATA:  Acute onset of dysuria. Patient has not voided recently. Initial encounter. EXAM: CHEST  2 VIEW COMPARISON:  Chest radiograph performed 03/15/2016 FINDINGS: The lungs are well-aerated. Mild left basilar atelectasis is noted. There is no evidence of pleural effusion or pneumothorax. The heart is borderline enlarged. No acute osseous abnormalities are seen. Right convex thoracic scoliosis is noted. The patient's right shoulder arthroplasty is chronically superiorly subluxed. IMPRESSION: 1. Mild left basilar atelectasis noted.  Borderline cardiomegaly. 2. Right convex thoracic scoliosis noted. Electronically Signed   By: Roanna Raider M.D.   On: 03/28/2016 01:53    Procedures Fecal disimpaction Date/Time: 03/28/2016 5:03 AM Performed by: Adela Lank Rosalene Wardrop Authorized by: Melene Plan  Consent: Verbal consent obtained. Risks and benefits: risks, benefits and alternatives were discussed Consent given by: patient Patient understanding: patient states understanding of the procedure  being performed Patient consent: the patient's understanding of the procedure matches consent given Time out: Immediately prior to procedure a "time out" was called to verify the correct patient, procedure, equipment, support staff and site/side marked as required. Local anesthesia used: no  Anesthesia: Local anesthesia used: no  Sedation: Patient sedated: no Patient tolerance: Patient tolerated the procedure well with no immediate complications    (including critical care time)  DIAGNOSTIC STUDIES: Oxygen Saturation is 96% on RA, adequate by my interpretation.    COORDINATION OF CARE: 12:34 AM Discussed treatment plan with pt and her family members at bedside and they agreed to plan.  Medications Ordered in ED Medications  sodium chloride 0.9 % bolus 1,000 mL (0 mLs Intravenous Stopped 03/28/16 0418)  ALPRAZolam Prudy Feeler) tablet 0.5 mg (0.5 mg Oral Given 03/28/16 0425)     Initial Impression / Assessment and Plan / ED Course  I have reviewed the triage vital signs and the nursing notes.  Pertinent labs & imaging results that were available during my care of the patient were reviewed by me and considered in my medical decision making (see chart for details).  Clinical Course     83 yo F With a chief complaints of decreased oral intake and decreased urinary output. Going on for the past couple days. Family is concerned because she hasn't been the past 12 hours. Also complaining of severe rectal pain. Stated that she had some very large bowel movements over the past couple days. UA without urinary tract infection. Patient found to be impacted. This was removed at bedside. Patient with significant improvement. Discharge home.  5:03 AM:  I have discussed the diagnosis/risks/treatment options with the patient and family and believe the pt to be eligible for discharge home to follow-up with PCP. We also discussed returning to the ED immediately if new or worsening sx occur. We discussed  the sx which are most concerning (e.g., sudden worsening pain, fever, inability to tolerate by mouth) that necessitate immediate return. Medications administered to the patient during their visit and any new prescriptions provided to the patient are listed below.  Medications given during this visit Medications  sodium chloride 0.9 % bolus 1,000 mL (0 mLs Intravenous Stopped 03/28/16 0418)  ALPRAZolam Prudy Feeler) tablet 0.5 mg (0.5 mg Oral Given 03/28/16 0425)     The patient appears reasonably screen and/or stabilized for discharge and I doubt any other medical condition or other [facility] requiring further screening, evaluation, or treatment in the ED at this time prior to discharge.    Final Clinical Impressions(s) / ED Diagnoses   Final diagnoses:  Dehydration  Failure to thrive in adult  Fecal impaction [facility])    New Prescriptions Discharge Medication List as of 03/28/2016  2:54 AM    START taking these medications   Details  metoCLOPramide (REGLAN) 10 MG tablet Take 1 tablet (10 mg total) by mouth every 6 (six) hours., Starting Sun 03/28/2016, Print        I personally performed the services described in this documentation, which was scribed in my presence. The recorded information has been reviewed and is accurate.    Melene Plan, DO 03/28/16 1610

## 2016-03-28 NOTE — Discharge Instructions (Signed)
Follow up with Dr. Kevan Ny, give him a call on Tuesday.

## 2016-03-30 ENCOUNTER — Ambulatory Visit: Payer: Self-pay | Admitting: Cardiology

## 2016-04-12 ENCOUNTER — Encounter (HOSPITAL_COMMUNITY): Payer: Self-pay | Admitting: Neurology

## 2016-04-12 ENCOUNTER — Telehealth: Payer: Self-pay | Admitting: Cardiology

## 2016-04-12 ENCOUNTER — Emergency Department (HOSPITAL_COMMUNITY): Payer: Medicare Other

## 2016-04-12 ENCOUNTER — Observation Stay (HOSPITAL_COMMUNITY)
Admission: EM | Admit: 2016-04-12 | Discharge: 2016-04-16 | Disposition: A | Discharge status: Home / Self Care | Payer: Medicare Other | Attending: Internal Medicine | Admitting: Internal Medicine

## 2016-04-12 DIAGNOSIS — G894 Chronic pain syndrome: Secondary | ICD-10-CM

## 2016-04-12 DIAGNOSIS — F418 Other specified anxiety disorders: Secondary | ICD-10-CM | POA: Diagnosis not present

## 2016-04-12 DIAGNOSIS — H409 Unspecified glaucoma: Secondary | ICD-10-CM | POA: Insufficient documentation

## 2016-04-12 DIAGNOSIS — I5032 Chronic diastolic (congestive) heart failure: Secondary | ICD-10-CM | POA: Insufficient documentation

## 2016-04-12 DIAGNOSIS — Z7902 Long term (current) use of antithrombotics/antiplatelets: Secondary | ICD-10-CM | POA: Insufficient documentation

## 2016-04-12 DIAGNOSIS — Z66 Do not resuscitate: Secondary | ICD-10-CM | POA: Insufficient documentation

## 2016-04-12 DIAGNOSIS — M419 Scoliosis, unspecified: Secondary | ICD-10-CM | POA: Diagnosis not present

## 2016-04-12 DIAGNOSIS — R918 Other nonspecific abnormal finding of lung field: Secondary | ICD-10-CM | POA: Insufficient documentation

## 2016-04-12 DIAGNOSIS — Z800 Family history of malignant neoplasm of digestive organs: Secondary | ICD-10-CM | POA: Insufficient documentation

## 2016-04-12 DIAGNOSIS — I959 Hypotension, unspecified: Secondary | ICD-10-CM | POA: Diagnosis present

## 2016-04-12 DIAGNOSIS — K5900 Constipation, unspecified: Secondary | ICD-10-CM | POA: Diagnosis not present

## 2016-04-12 DIAGNOSIS — Z96611 Presence of right artificial shoulder joint: Secondary | ICD-10-CM | POA: Insufficient documentation

## 2016-04-12 DIAGNOSIS — Z833 Family history of diabetes mellitus: Secondary | ICD-10-CM | POA: Insufficient documentation

## 2016-04-12 DIAGNOSIS — I272 Pulmonary hypertension, unspecified: Secondary | ICD-10-CM | POA: Insufficient documentation

## 2016-04-12 DIAGNOSIS — Z8249 Family history of ischemic heart disease and other diseases of the circulatory system: Secondary | ICD-10-CM | POA: Insufficient documentation

## 2016-04-12 DIAGNOSIS — K449 Diaphragmatic hernia without obstruction or gangrene: Secondary | ICD-10-CM | POA: Insufficient documentation

## 2016-04-12 DIAGNOSIS — J189 Pneumonia, unspecified organism: Secondary | ICD-10-CM | POA: Diagnosis not present

## 2016-04-12 DIAGNOSIS — I493 Ventricular premature depolarization: Secondary | ICD-10-CM | POA: Insufficient documentation

## 2016-04-12 DIAGNOSIS — M6281 Muscle weakness (generalized): Secondary | ICD-10-CM

## 2016-04-12 DIAGNOSIS — I110 Hypertensive heart disease with heart failure: Secondary | ICD-10-CM | POA: Insufficient documentation

## 2016-04-12 DIAGNOSIS — N179 Acute kidney failure, unspecified: Secondary | ICD-10-CM | POA: Insufficient documentation

## 2016-04-12 DIAGNOSIS — N390 Urinary tract infection, site not specified: Secondary | ICD-10-CM | POA: Insufficient documentation

## 2016-04-12 DIAGNOSIS — E860 Dehydration: Secondary | ICD-10-CM | POA: Insufficient documentation

## 2016-04-12 DIAGNOSIS — A419 Sepsis, unspecified organism: Secondary | ICD-10-CM

## 2016-04-12 DIAGNOSIS — Z7901 Long term (current) use of anticoagulants: Secondary | ICD-10-CM | POA: Insufficient documentation

## 2016-04-12 DIAGNOSIS — D649 Anemia, unspecified: Secondary | ICD-10-CM | POA: Diagnosis not present

## 2016-04-12 DIAGNOSIS — K219 Gastro-esophageal reflux disease without esophagitis: Secondary | ICD-10-CM | POA: Insufficient documentation

## 2016-04-12 DIAGNOSIS — I2510 Atherosclerotic heart disease of native coronary artery without angina pectoris: Secondary | ICD-10-CM | POA: Diagnosis not present

## 2016-04-12 DIAGNOSIS — Y95 Nosocomial condition: Secondary | ICD-10-CM | POA: Insufficient documentation

## 2016-04-12 DIAGNOSIS — I252 Old myocardial infarction: Secondary | ICD-10-CM

## 2016-04-12 DIAGNOSIS — I700 Atherosclerosis of aorta: Secondary | ICD-10-CM | POA: Insufficient documentation

## 2016-04-12 DIAGNOSIS — Z87891 Personal history of nicotine dependence: Secondary | ICD-10-CM | POA: Insufficient documentation

## 2016-04-12 DIAGNOSIS — Z7982 Long term (current) use of aspirin: Secondary | ICD-10-CM | POA: Insufficient documentation

## 2016-04-12 DIAGNOSIS — Z955 Presence of coronary angioplasty implant and graft: Secondary | ICD-10-CM | POA: Insufficient documentation

## 2016-04-12 DIAGNOSIS — N8110 Cystocele, unspecified: Secondary | ICD-10-CM | POA: Insufficient documentation

## 2016-04-12 DIAGNOSIS — G2581 Restless legs syndrome: Secondary | ICD-10-CM | POA: Diagnosis not present

## 2016-04-12 DIAGNOSIS — Z880 Allergy status to penicillin: Secondary | ICD-10-CM | POA: Insufficient documentation

## 2016-04-12 LAB — BASIC METABOLIC PANEL
ANION GAP: 10 (ref 5–15)
BUN: 24 mg/dL — ABNORMAL HIGH (ref 6–20)
CO2: 21 mmol/L — ABNORMAL LOW (ref 22–32)
Calcium: 9.6 mg/dL (ref 8.9–10.3)
Chloride: 107 mmol/L (ref 101–111)
Creatinine, Ser: 1.37 mg/dL — ABNORMAL HIGH (ref 0.44–1.00)
GFR calc Af Amer: 39 mL/min — ABNORMAL LOW (ref >60)
GFR, EST NON AFRICAN AMERICAN: 34 mL/min — AB (ref >60)
Glucose, Bld: 135 mg/dL — ABNORMAL HIGH (ref 65–99)
POTASSIUM: 3.9 mmol/L (ref 3.5–5.1)
SODIUM: 138 mmol/L (ref 135–145)

## 2016-04-12 LAB — BRAIN NATRIURETIC PEPTIDE: B Natriuretic Peptide: 94.3 pg/mL (ref 0.0–100.0)

## 2016-04-12 LAB — CBC
HEMATOCRIT: 37.4 % (ref 36.0–46.0)
Hemoglobin: 11.9 g/dL — ABNORMAL LOW (ref 12.0–15.0)
MCH: 29.6 pg (ref 26.0–34.0)
MCHC: 31.8 g/dL (ref 30.0–36.0)
MCV: 93 fL (ref 78.0–100.0)
Platelets: 378 10*3/uL (ref 150–400)
RBC: 4.02 MIL/uL (ref 3.87–5.11)
RDW: 16.2 % — AB (ref 11.5–15.5)
WBC: 13.4 10*3/uL — AB (ref 4.0–10.5)

## 2016-04-12 LAB — URINALYSIS, ROUTINE W REFLEX MICROSCOPIC
Bilirubin Urine: NEGATIVE
GLUCOSE, UA: NEGATIVE mg/dL
Ketones, ur: 15 mg/dL — AB
Nitrite: NEGATIVE
PROTEIN: NEGATIVE mg/dL
Specific Gravity, Urine: 1.025 (ref 1.005–1.030)
pH: 5.5 (ref 5.0–8.0)

## 2016-04-12 LAB — RESPIRATORY PANEL BY PCR
Adenovirus: NOT DETECTED
Bordetella pertussis: NOT DETECTED
CORONAVIRUS 229E-RVPPCR: NOT DETECTED
CORONAVIRUS OC43-RVPPCR: NOT DETECTED
Chlamydophila pneumoniae: NOT DETECTED
Coronavirus HKU1: NOT DETECTED
Coronavirus NL63: NOT DETECTED
INFLUENZA B-RVPPCR: NOT DETECTED
Influenza A: NOT DETECTED
METAPNEUMOVIRUS-RVPPCR: NOT DETECTED
Mycoplasma pneumoniae: NOT DETECTED
PARAINFLUENZA VIRUS 1-RVPPCR: NOT DETECTED
PARAINFLUENZA VIRUS 2-RVPPCR: NOT DETECTED
PARAINFLUENZA VIRUS 3-RVPPCR: NOT DETECTED
PARAINFLUENZA VIRUS 4-RVPPCR: NOT DETECTED
RESPIRATORY SYNCYTIAL VIRUS-RVPPCR: NOT DETECTED
RHINOVIRUS / ENTEROVIRUS - RVPPCR: NOT DETECTED

## 2016-04-12 LAB — LACTIC ACID, PLASMA
LACTIC ACID, VENOUS: 2.3 mmol/L — AB (ref 0.5–1.9)
LACTIC ACID, VENOUS: 0.9 mmol/L (ref 0.5–1.9)

## 2016-04-12 LAB — I-STAT TROPONIN, ED: Troponin i, poc: 0.01 ng/mL (ref 0.00–0.08)

## 2016-04-12 LAB — URINALYSIS, MICROSCOPIC (REFLEX)

## 2016-04-12 LAB — STREP PNEUMONIAE URINARY ANTIGEN: Strep Pneumo Urinary Antigen: NEGATIVE

## 2016-04-12 LAB — PROCALCITONIN: Procalcitonin: <0.1 ng/mL

## 2016-04-12 LAB — TROPONIN I

## 2016-04-12 LAB — INFLUENZA PANEL BY PCR (TYPE A & B)
Influenza A By PCR: NEGATIVE
Influenza B By PCR: NEGATIVE

## 2016-04-12 MED ORDER — SODIUM CHLORIDE 0.9 % IV BOLUS (SEPSIS)
1000.0000 mL | Freq: Once | INTRAVENOUS | Status: AC
  Administered 2016-04-12: 1000 mL via INTRAVENOUS

## 2016-04-12 MED ORDER — HYPROMELLOSE (GONIOSCOPIC) 2.5 % OP SOLN
1.0000 [drp] | Freq: Two times a day (BID) | OPHTHALMIC | Status: DC | PRN
  Administered 2016-04-14: 1 [drp] via OPHTHALMIC
  Filled 2016-04-12: qty 15

## 2016-04-12 MED ORDER — VANCOMYCIN HCL IN DEXTROSE 1-5 GM/200ML-% IV SOLN
1000.0000 mg | Freq: Once | INTRAVENOUS | Status: AC
  Administered 2016-04-12: 1000 mg via INTRAVENOUS
  Filled 2016-04-12: qty 200

## 2016-04-12 MED ORDER — MORPHINE SULFATE ER 15 MG PO TBCR
15.0000 mg | EXTENDED_RELEASE_TABLET | Freq: Two times a day (BID) | ORAL | Status: DC
  Administered 2016-04-12 – 2016-04-16 (×8): 15 mg via ORAL
  Filled 2016-04-12 (×8): qty 1

## 2016-04-12 MED ORDER — LEVOFLOXACIN IN D5W 750 MG/150ML IV SOLN
750.0000 mg | Freq: Once | INTRAVENOUS | Status: AC
  Administered 2016-04-12: 750 mg via INTRAVENOUS
  Filled 2016-04-12: qty 150

## 2016-04-12 MED ORDER — DORZOLAMIDE HCL 2 % OP SOLN
1.0000 [drp] | Freq: Two times a day (BID) | OPHTHALMIC | Status: DC
  Administered 2016-04-14 – 2016-04-16 (×4): 1 [drp] via OPHTHALMIC
  Filled 2016-04-12 (×3): qty 10

## 2016-04-12 MED ORDER — DOCUSATE SODIUM 100 MG PO CAPS: 100.0000 mg | ORAL_CAPSULE | Freq: Two times a day (BID) | ORAL | Status: DC

## 2016-04-12 MED ORDER — OXYCODONE HCL 5 MG PO TABS
5.0000 mg | ORAL_TABLET | Freq: Three times a day (TID) | ORAL | Status: DC | PRN
  Administered 2016-04-15: 5 mg via ORAL
  Filled 2016-04-12: qty 1

## 2016-04-12 MED ORDER — HEPARIN SODIUM (PORCINE) 5000 UNIT/ML IJ SOLN
5000.0000 [IU] | Freq: Three times a day (TID) | INTRAMUSCULAR | Status: DC
  Administered 2016-04-12 – 2016-04-16 (×9): 5000 [IU] via SUBCUTANEOUS
  Filled 2016-04-12 (×10): qty 1

## 2016-04-12 MED ORDER — SENNA 8.6 MG PO TABS: 2.0000 | ORAL_TABLET | Freq: Every day | ORAL | Status: DC | PRN

## 2016-04-12 MED ORDER — CLOPIDOGREL BISULFATE 75 MG PO TABS
75.0000 mg | ORAL_TABLET | Freq: Every day | ORAL | Status: DC
  Administered 2016-04-12 – 2016-04-15 (×4): 75 mg via ORAL
  Filled 2016-04-12 (×4): qty 1

## 2016-04-12 MED ORDER — ACETAMINOPHEN 650 MG RE SUPP: 650.0000 mg | Freq: Four times a day (QID) | RECTAL | Status: DC | PRN

## 2016-04-12 MED ORDER — PANTOPRAZOLE SODIUM 40 MG PO TBEC: 80.0000 mg | DELAYED_RELEASE_TABLET | Freq: Every day | ORAL | Status: DC

## 2016-04-12 MED ORDER — ONDANSETRON HCL 4 MG/2ML IJ SOLN: 4.0000 mg | Freq: Four times a day (QID) | INTRAMUSCULAR | Status: DC | PRN

## 2016-04-12 MED ORDER — POLYETHYL GLYCOL-PROPYL GLYCOL 0.4-0.3 % OP SOLN: 1.0000 [drp] | Freq: Two times a day (BID) | OPHTHALMIC | Status: DC

## 2016-04-12 MED ORDER — VENLAFAXINE HCL ER 75 MG PO CP24: 75.0000 mg | ORAL_CAPSULE | Freq: Every day | ORAL | Status: DC

## 2016-04-12 MED ORDER — SODIUM CHLORIDE 0.9% FLUSH
3.0000 mL | Freq: Two times a day (BID) | INTRAVENOUS | Status: DC
  Administered 2016-04-13 – 2016-04-16 (×6): 3 mL via INTRAVENOUS

## 2016-04-12 MED ORDER — LEVOFLOXACIN IN D5W 500 MG/100ML IV SOLN: 500.0000 mg | INTRAVENOUS | Status: DC

## 2016-04-12 MED ORDER — VITAMIN D 1000 UNITS PO TABS: 1000.0000 [IU] | ORAL_TABLET | Freq: Every morning | ORAL | Status: DC

## 2016-04-12 MED ORDER — PANTOPRAZOLE SODIUM 40 MG PO TBEC
80.0000 mg | DELAYED_RELEASE_TABLET | Freq: Every day | ORAL | Status: DC
  Administered 2016-04-13 – 2016-04-16 (×4): 80 mg via ORAL
  Filled 2016-04-12 (×5): qty 2

## 2016-04-12 MED ORDER — CLOPIDOGREL BISULFATE 75 MG PO TABS: 75.0000 mg | ORAL_TABLET | Freq: Every day | ORAL | Status: DC

## 2016-04-12 MED ORDER — SODIUM CHLORIDE 0.9 % IV BOLUS (SEPSIS)
500.0000 mL | Freq: Once | INTRAVENOUS | Status: AC
  Administered 2016-04-12: 500 mL via INTRAVENOUS

## 2016-04-12 MED ORDER — ACETAMINOPHEN 325 MG PO TABS: 650.0000 mg | ORAL_TABLET | Freq: Four times a day (QID) | ORAL | Status: DC | PRN

## 2016-04-12 MED ORDER — ALPRAZOLAM 0.25 MG PO TABS
0.2500 mg | ORAL_TABLET | Freq: Two times a day (BID) | ORAL | Status: DC | PRN
  Administered 2016-04-13 – 2016-04-15 (×3): 0.25 mg via ORAL
  Filled 2016-04-12 (×4): qty 1

## 2016-04-12 MED ORDER — OLANZAPINE-FLUOXETINE HCL 3-25 MG PO CAPS
1.0000 | ORAL_CAPSULE | Freq: Every evening | ORAL | Status: DC
  Filled 2016-04-12: qty 1

## 2016-04-12 MED ORDER — PRAMIPEXOLE DIHYDROCHLORIDE 0.25 MG PO TABS
0.2500 mg | ORAL_TABLET | Freq: Every day | ORAL | Status: DC
  Administered 2016-04-12 – 2016-04-15 (×4): 0.25 mg via ORAL
  Filled 2016-04-12 (×4): qty 1

## 2016-04-12 MED ORDER — ASPIRIN EC 81 MG PO TBEC: 81.0000 mg | DELAYED_RELEASE_TABLET | Freq: Every day | ORAL | Status: DC

## 2016-04-12 MED ORDER — LISINOPRIL 10 MG PO TABS: 10.0000 mg | ORAL_TABLET | Freq: Every day | ORAL | Status: DC

## 2016-04-12 MED ORDER — ROPINIROLE HCL 0.25 MG PO TABS: 0.2500 mg | ORAL_TABLET | Freq: Every day | ORAL | Status: DC | PRN

## 2016-04-12 MED ORDER — OLANZAPINE-FLUOXETINE HCL 3-25 MG PO CAPS
1.0000 | ORAL_CAPSULE | Freq: Every evening | ORAL | Status: DC
  Administered 2016-04-12 – 2016-04-15 (×4): 1 via ORAL
  Filled 2016-04-12 (×5): qty 1

## 2016-04-12 MED ORDER — CELECOXIB 200 MG PO CAPS: 200.0000 mg | ORAL_CAPSULE | Freq: Every day | ORAL | Status: DC

## 2016-04-12 MED ORDER — SODIUM CHLORIDE 0.9 % IV SOLN
INTRAVENOUS | Status: AC
  Administered 2016-04-12 – 2016-04-13 (×2): via INTRAVENOUS

## 2016-04-12 MED ORDER — ASPIRIN EC 81 MG PO TBEC
81.0000 mg | DELAYED_RELEASE_TABLET | Freq: Every day | ORAL | Status: DC
  Administered 2016-04-12 – 2016-04-15 (×4): 81 mg via ORAL
  Filled 2016-04-12 (×4): qty 1

## 2016-04-12 MED ORDER — VITAMIN D 1000 UNITS PO TABS
1000.0000 [IU] | ORAL_TABLET | Freq: Every morning | ORAL | Status: DC
  Administered 2016-04-12 – 2016-04-15 (×4): 1000 [IU] via ORAL
  Filled 2016-04-12 (×4): qty 1

## 2016-04-12 MED ORDER — ONDANSETRON HCL 4 MG PO TABS: 4.0000 mg | ORAL_TABLET | Freq: Four times a day (QID) | ORAL | Status: DC | PRN

## 2016-04-12 MED ORDER — PREDNISOLONE ACETATE 1 % OP SUSP
1.0000 [drp] | Freq: Two times a day (BID) | OPHTHALMIC | Status: DC
  Administered 2016-04-12 – 2016-04-16 (×8): 1 [drp] via OPHTHALMIC
  Filled 2016-04-12: qty 1

## 2016-04-12 MED ORDER — FLUTICASONE FUROATE-VILANTEROL 100-25 MCG/INH IN AEPB
1.0000 | INHALATION_SPRAY | Freq: Every day | RESPIRATORY_TRACT | Status: DC
Start: 2016-04-12 — End: 2016-04-16
  Administered 2016-04-13 – 2016-04-16 (×4): 1 via RESPIRATORY_TRACT
  Filled 2016-04-12: qty 28

## 2016-04-12 MED ORDER — MORPHINE SULFATE ER 30 MG PO TBCR: 30.0000 mg | EXTENDED_RELEASE_TABLET | Freq: Two times a day (BID) | ORAL | Status: DC

## 2016-04-12 NOTE — Telephone Encounter (Signed)
Received call from Neysa Bonito (daughter) who reports patient has had bad congestion & cold - on inhaler, had been on zpack By her report, the patient stayed w other daughter this weekend, and returned last night worse than before. Reporting patient  "so out of breath she can't stand up and brush her teeth". Notes main c/o SOB, "extreme weakness", cough & congestion. Caller feels like this has not improved and in fact is worse now. Does not know if pt has LE edema, irreg HR, etc. I did advise that if the patient is short of breath w no/minimal activity she should probably be eval'd in ER. I expressed concern for possible pneumonia or other complication w her recent respiratory ilness.  Pt has appt sched w [place] tomorrow. Routed as FYI. Will recheck on this for outcome of ED evaluation.

## 2016-04-12 NOTE — ED Provider Notes (Signed)
[facility]-EMERGENCY DEPT Provider Note   CSN: [id number] Arrival date & time: 04/12/16  1052     History   Chief Complaint Chief Complaint  Patient presents with  . Shortness of Breath    HPI Alexis Yates is a 83 y.o. female.  83 yo F w/ h/o CAD, HTN, UTI here with progressive weakness, and worsening dyspnea over last couple weeks with significant worsening over the last few days. Patient with some intermittent lower abdominal pain but no urinary symptoms. No constipation or diarrhea.has not had any chest pain. Has recently had cold symptoms of productive cough that had yellowish greenish phlegm. Has also had a runny nose. She's been exposed to multiple people with URIs but nobody with the flu that she knows of. No fevers or chills during this time.      Past Medical History:  Diagnosis Date  . Arthritis   . Atherosclerosis of aorta (HCC)   . CAD (coronary artery disease)    a. 10/27 PCI with DES to RCA, diffuse nonobstructive disease, EF 50-55%  . Chronic pain   . Diverticulosis   . Elevated transaminase level   . GERD (gastroesophageal reflux disease)   . Glaucoma   . Hiatal hernia   . Hypertension   . Lung nodules    Right  . MI (myocardial infarction) 01/09/2016  . Scoliosis   . UTI (urinary tract infection)    refractory    Patient Active Problem List   Diagnosis Date Noted  . Hypotension 04/12/2016  . Abdominal pain 03/07/2016  . Anemia 03/06/2016  . Diarrhea 03/06/2016  . Generalized weakness 03/06/2016  . Elevated transaminase level 03/06/2016  . Chronic pain   . Diverticulitis 02/29/2016  . ST elevation myocardial infarction (STEMI) of inferior wall, initial episode of care (HCC) 01/09/2016  . Urinary tract infection without hematuria   . NSTEMI (non-ST elevated myocardial infarction) (HCC) 01/08/2016  . UTI (urinary tract infection) 05/27/2014  . PID (acute pelvic inflammatory disease) 05/27/2014  . Vaginal inflammation from pessary (HCC) 05/27/2014   . Leukocytosis 05/27/2014  . Nausea and vomiting 05/27/2014  . Hypertension   . Hiatal hernia   . Lung nodules   . Glaucoma   . GERD (gastroesophageal reflux disease)     Past Surgical History:  Procedure Laterality Date  . ABDOMINAL HYSTERECTOMY    . CARDIAC CATHETERIZATION N/A 01/09/2016   Procedure: Left Heart Cath and Coronary Angiography;  Surgeon: Marykay Lex, MD;  Location: [facility] INVASIVE CV LAB;  Service: Cardiovascular;  Laterality: N/A;  . CARDIAC CATHETERIZATION N/A 01/09/2016   Procedure: Coronary Stent Intervention;  Surgeon: Marykay Lex, MD;  Location: [facility] INVASIVE CV LAB;  Service: Cardiovascular;  Laterality: N/A;  . EYE SURGERY    . FRACTURE SURGERY     Tibia/fibula of right leg    OB History    No data available       Home Medications    Prior to Admission medications   Medication Sig Start Date End Date Taking? Authorizing Provider  ALPRAZolam (XANAX) 0.25 MG tablet Take 1 tablet (0.25 mg total) by mouth 2 (two) times daily as needed for anxiety. 03/09/16  Yes Calvert Cantor, MD  aspirin EC 81 MG EC tablet Take 1 tablet (81 mg total) by mouth daily. 01/14/16  Yes Arty Baumgartner, NP  bimatoprost (LUMIGAN) 0.01 % SOLN Place 1 drop into the left eye at bedtime.    Yes Historical Provider, MD  calcium-vitamin D 250-100 MG-UNIT per tablet Take  1 tablet by mouth 2 (two) times daily.   Yes Historical Provider, MD  Cholecalciferol (VITAMIN D3) 1000 units CAPS Take 1,000 Units by mouth every morning.   Yes Historical Provider, MD  clopidogrel (PLAVIX) 75 MG tablet Take 1 tablet (75 mg total) by mouth daily. 01/14/16  Yes Arty Baumgartner, NP  dorzolamide (TRUSOPT) 2 % ophthalmic solution Place 1 drop into both eyes 2 (two) times daily.   Yes Historical Provider, MD  esomeprazole (NEXIUM) 40 MG capsule Take 40 mg by mouth daily. 03/19/16  Yes Historical Provider, MD  fluticasone furoate-vilanterol (BREO ELLIPTA) 100-25 MCG/INH AEPB Inhale 1 puff into the lungs  daily.   Yes Historical Provider, MD  lisinopril (PRINIVIL,ZESTRIL) 10 MG tablet Take 10 mg by mouth daily.   Yes Historical Provider, MD  OLANZapine-FLUoxetine (SYMBYAX) 3-25 MG capsule Take 1 capsule by mouth every evening.   Yes Historical Provider, MD  oxyCODONE (OXY IR/ROXICODONE) 5 MG immediate release tablet Take 1 tablet (5 mg total) by mouth every 6 (six) hours as needed for severe pain (pain). Patient taking differently: Take 5 mg by mouth every 8 (eight) hours as needed for severe pain (for breakthrough pain).  03/09/16  Yes Calvert Cantor, MD  oxymorphone 10 MG PO T12A 12 hr tablet Take 1 tablet (10 mg total) by mouth every 12 (twelve) hours. 03/09/16  Yes Calvert Cantor, MD  Polyethyl Glycol-Propyl Glycol (SYSTANE ULTRA) 0.4-0.3 % SOLN Place 1 drop into both eyes 2 (two) times daily.   Yes Historical Provider, MD  pramipexole (MIRAPEX) 0.25 MG tablet Take 0.25 mg by mouth at bedtime. 03/19/16  Yes Historical Provider, MD  prednisoLONE acetate (PRED FORTE) 1 % ophthalmic suspension Place 1 drop into the right eye 2 (two) times daily.  01/08/15  Yes Historical Provider, MD  senna (SENOKOT) 8.6 MG TABS tablet Take 2 tablets (17.2 mg total) by mouth at bedtime as needed for mild constipation. Patient taking differently: Take by mouth at bedtime as needed for mild constipation.  03/04/16  Yes Clanford Cyndie Mull, MD  acetaminophen (TYLENOL) 325 MG tablet Take 2 tablets (650 mg total) by mouth every 6 (six) hours as needed for mild pain (or Fever >/= 101). Patient not taking: Reported on 04/12/2016 03/09/16   Calvert Cantor, MD  atorvastatin (LIPITOR) 40 MG tablet Take 1 tablet (40 mg total) by mouth daily at 6 PM. Patient not taking: Reported on 04/12/2016 01/13/16   Arty Baumgartner, NP  celecoxib (CELEBREX) 200 MG capsule Take 200 mg by mouth daily.     Historical Provider, MD  docusate sodium (COLACE) 100 MG capsule Take 100 mg by mouth 2 (two) times daily.     Historical Provider, MD  feeding  supplement, ENSURE ENLIVE, (ENSURE ENLIVE) LIQD Take 237 mLs by mouth 2 (two) times daily between meals. Patient not taking: Reported on 03/28/2016 03/04/16   Clanford Cyndie Mull, MD  metoCLOPramide (REGLAN) 10 MG tablet Take 1 tablet (10 mg total) by mouth every 6 (six) hours. Patient not taking: Reported on 04/12/2016 03/28/16   Melene Plan, DO  ondansetron (ZOFRAN ODT) 4 MG disintegrating tablet Take 1 tablet (4 mg total) by mouth every 8 (eight) hours as needed for nausea or vomiting. Patient not taking: Reported on 04/12/2016 03/09/16   Calvert Cantor, MD  rOPINIRole (REQUIP) 0.25 MG tablet Take 0.25 mg by mouth daily as needed (for restless legs).    Historical Provider, MD  venlafaxine XR (EFFEXOR-XR) 75 MG 24 hr capsule Take 75 mg by  mouth daily. 03/19/16   Historical Provider, MD    Family History Family History  Problem Relation Age of Onset  . Cancer Brother     Stomach  . Heart disease Father     Died age 103  . Diabetes Mother   . Heart failure Mother     Died 52    Social History Social History  Substance Use Topics  . Smoking status: Former Games developer  . Smokeless tobacco: Never Used  . Alcohol use No     Allergies   Penicillins and Phenergan [promethazine hcl]   Review of Systems Review of Systems  Constitutional: Positive for chills. Negative for fever.  Respiratory: Positive for cough and shortness of breath.   Gastrointestinal: Positive for abdominal pain (lower/suprapubic).  Musculoskeletal: Negative for back pain, gait problem, neck pain and neck stiffness.  Neurological: Positive for weakness.  All other systems reviewed and are negative.    Physical Exam Updated Vital Signs BP (!) 149/69 (BP Location: Right Arm)   Pulse (!) 109   Temp 97.7 F (36.5 C) (Oral)   Resp 18   Ht 5' (1.524 m)   Wt 110 lb 3.2 oz (50 kg) Comment: scale b  SpO2 94%   BMI 21.52 kg/m   Physical Exam  Constitutional: She is oriented to person, place, and time. She appears  well-developed and well-nourished.  HENT:  Head: Normocephalic and atraumatic.  Eyes: Conjunctivae and EOM are normal.  Neck: Normal range of motion.  Cardiovascular: Regular rhythm.  Tachycardia present.   Pulmonary/Chest: No stridor. No respiratory distress. She has no decreased breath sounds. She has no wheezes. She has rales.  Abdominal: Soft. She exhibits no distension. There is no tenderness.  Musculoskeletal: Normal range of motion. She exhibits no edema, tenderness or deformity.  Neurological: She is alert and oriented to person, place, and time. No cranial nerve deficit. Coordination normal.  Skin: Skin is warm and dry.  Nursing note and vitals reviewed.    ED Treatments / Results  Labs (all labs ordered are listed, but only abnormal results are displayed) Labs Reviewed  BASIC METABOLIC PANEL - Abnormal; Notable for the following:       Result Value   CO2 21 (*)    Glucose, Bld 135 (*)    BUN 24 (*)    Creatinine, Ser 1.37 (*)    GFR calc non Af Amer 34 (*)    GFR calc Af Amer 39 (*)    All other components within normal limits  CBC - Abnormal; Notable for the following:    WBC 13.4 (*)    Hemoglobin 11.9 (*)    RDW 16.2 (*)    All other components within normal limits  LACTIC ACID, PLASMA - Abnormal; Notable for the following:    Lactic Acid, Venous 2.3 (*)    All other components within normal limits  URINALYSIS, ROUTINE W REFLEX MICROSCOPIC - Abnormal; Notable for the following:    APPearance CLOUDY (*)    Hgb urine dipstick SMALL (*)    Ketones, ur 15 (*)    Leukocytes, UA LARGE (*)    All other components within normal limits  URINALYSIS, MICROSCOPIC (REFLEX) - Abnormal; Notable for the following:    Bacteria, UA FEW (*)    Squamous Epithelial / LPF 6-30 (*)    All other components within normal limits  CULTURE, BLOOD (ROUTINE X 2)  CULTURE, BLOOD (ROUTINE X 2)  RESPIRATORY PANEL BY PCR  TROPONIN I  BRAIN NATRIURETIC  PEPTIDE  LACTIC ACID, PLASMA    INFLUENZA PANEL BY PCR (TYPE A & B)  PROCALCITONIN  I-STAT TROPOININ, ED    EKG  EKG Interpretation  Date/Time:  Monday April 12 2016 11:01:55 EST Ventricular Rate:  113 PR Interval:  126 QRS Duration: 74 QT Interval:  314 QTC Calculation: 430 R Axis:   7 Text Interpretation:  Sinus tachycardia with occasional Premature ventricular complexes Possible Inferior infarct , age undetermined Possible Anterolateral infarct , age undetermined Abnormal ECG new q waves lateral leads new since jan 14 Confirmed by [facility] MD, Barbara Cower [phone]) on 04/12/2016 11:44:42 AM       Radiology Dg Chest Portable 1 View  Result Date: 04/12/2016 CLINICAL DATA:  Shortness of breath over the last week. EXAM: PORTABLE CHEST 1 VIEW COMPARISON:  03/28/2016 FINDINGS: Chronic left ventricular prominence. Chronic aortic atherosclerosis. The lungs are clear. The vascularity is normal. Chronic scoliosis. Previous right shoulder replacement with subluxation. Chronic degenerative change of the left shoulder. IMPRESSION: No active disease identified.  Chronic findings as above. Electronically Signed   By: Paulina Fusi M.D.   On: 04/12/2016 11:52    Procedures Procedures (including critical care time)  CRITICAL CARE Performed by: Marily Memos Total critical care time: 45 minutes Critical care time was exclusive of separately billable procedures and treating other patients. Critical care was necessary to treat or prevent imminent or life-threatening deterioration. Critical care was time spent personally by me on the following activities: development of treatment plan with patient and/or surrogate as well as nursing, discussions with consultants, evaluation of patient's response to treatment, examination of patient, obtaining history from patient or surrogate, ordering and performing treatments and interventions, ordering and review of laboratory studies, ordering and review of radiographic studies, pulse oximetry and  re-evaluation of patient's condition.   Medications Ordered in ED Medications  sodium chloride 0.9 % bolus 500 mL (0 mLs Intravenous Stopped 04/12/16 1321)  sodium chloride 0.9 % bolus 1,000 mL (0 mLs Intravenous Stopped 04/12/16 1545)  vancomycin (VANCOCIN) IVPB 1000 mg/200 mL premix (1,000 mg Intravenous New Bag/Given 04/12/16 1422)  levofloxacin (LEVAQUIN) IVPB 750 mg (750 mg Intravenous New Bag/Given 04/12/16 1424)     Initial Impression / Assessment and Plan / ED Course  I have reviewed the triage vital signs and the nursing notes.  Pertinent labs & imaging results that were available during my care of the patient were reviewed by me and considered in my medical decision making (see chart for details).    Cardiac vs infectious etiology. Last ef was 65% but e/o mild pulmonary htn, no meds for same. Is hypotensive with 70/40's. Suspect more likely to be infectious secondary to bilateral rales and his recent cough. She also has a elevated temperature but no fever. Also has elevated white blood cell count. Patient has significant allergies to penicillin, so vancomycin and Levaquin ordered. BP improved significantly with 1.5 liters of fluid. Discussed with medicine and will admit to the floor for now.  Final Clinical Impressions(s) / ED Diagnoses   Final diagnoses:  Hypotension, unspecified hypotension type  Sepsis, due to unspecified organism [facility])     Marily Memos, MD 04/12/16 1620

## 2016-04-12 NOTE — Progress Notes (Signed)
Pt educated about safety and bed alarm during the night, however pt refused to be on bed alarm. Will continue to monitor.  Alyviah Crandle, RN

## 2016-04-12 NOTE — Progress Notes (Signed)
Pharmacy Antibiotic Note Alexis Yates is a 83 y.o. female admitted on 04/12/2016 with pneumonia.  Pharmacy has been consulted for Levaquin dosing.  Plan: Levaquin 500 mg IV every 48 hours   Height: 5' (152.4 cm) Weight: 110 lb 3.2 oz (50 kg) (scale b) IBW/kg (Calculated) : 45.5  Temp (24hrs), Avg:98.4 F (36.9 C), Min:97.6 F (36.4 C), Max:99.8 F (37.7 C)   Recent Labs Lab 04/12/16 1118 04/12/16 1150 04/12/16 1448  WBC 13.4*  --   --   CREATININE 1.37*  --   --   LATICACIDVEN  --  2.3* 0.9    Estimated Creatinine Clearance: 21.2 mL/min (by C-G formula based on SCr of 1.37 mg/dL (H)).    Allergies  Allergen Reactions  . Penicillins Anaphylaxis    Has patient had a PCN reaction causing immediate rash, facial/tongue/throat swelling, SOB or lightheadedness with hypotension: Yes Has patient had a PCN reaction causing severe rash involving mucus membranes or skin necrosis: No Has patient had a PCN reaction that required hospitalization Yes Has patient had a PCN reaction occurring within the last 10 years: No If all of the above answers are "NO", then may proceed with Cephalosporin use.   Marland Kitchen Phenergan [Promethazine Hcl] Other (See Comments)    Restless legs    Antimicrobials this admission: 1/29 Levaquin  >>  1/29 vancomycin x 1 dose   Microbiology results: 1/29 BCx: px  Thank you for allowing pharmacy to be a part of this patient's care.  Pollyann Samples, PharmD, BCPS 04/12/2016, 5:18 PM

## 2016-04-12 NOTE — H&P (Signed)
History and Physical    Alexis Yates:[id number] DOB: September 24, 1927 DOA: 04/12/2016  PCP: Pearla Dubonnet, MD Patient coming from: home  Chief Complaint: sob and cough  HPI: Alexis Yates is a 83 y.o. female with medical history significant of  CAD, GERD, HTN, CAD/MI, recurrent UTI, presenting with partially one week history of worsening shortness of breath and productive cough. Patient intermittently dyspneic with exertion prior to that. Symptoms are getting worse. Denies any chest pain, palpitations, abdominal pain, dysuria, frequency, back pain, fevers, nausea, vomiting. Patient does state feeling more tired and run down than usual over the last couple of days. Decreased oral intake.  ED Course: Active and is outlined below. Given 1.5 L normal saline for volume depletion and started on antibiotics.  Review of Systems: As per HPI otherwise 10 point review of systems negative.   Ambulatory Status: Requires a cane and ambulates by self.  Past Medical History:  Diagnosis Date  . Arthritis   . Atherosclerosis of aorta (HCC)   . CAD (coronary artery disease)    a. 10/27 PCI with DES to RCA, diffuse nonobstructive disease, EF 50-55%  . Chronic pain   . Diverticulosis   . Elevated transaminase level   . GERD (gastroesophageal reflux disease)   . Glaucoma   . Hiatal hernia   . Hypertension   . Lung nodules    Right  . MI (myocardial infarction) 01/09/2016  . Scoliosis   . UTI (urinary tract infection)    refractory    Past Surgical History:  Procedure Laterality Date  . ABDOMINAL HYSTERECTOMY    . CARDIAC CATHETERIZATION N/A 01/09/2016   Procedure: Left Heart Cath and Coronary Angiography;  Surgeon: Marykay Lex, MD;  Location: [facility] INVASIVE CV LAB;  Service: Cardiovascular;  Laterality: N/A;  . CARDIAC CATHETERIZATION N/A 01/09/2016   Procedure: Coronary Stent Intervention;  Surgeon: Marykay Lex, MD;  Location: [facility] INVASIVE CV LAB;  Service: Cardiovascular;   Laterality: N/A;  . EYE SURGERY    . FRACTURE SURGERY     Tibia/fibula of right leg    Social History   Social History  . Marital status: Widowed    Spouse name: N/A  . Number of children: 2  . Years of education: N/A   Occupational History  . Retired from school system    Social History Main Topics  . Smoking status: Former Games developer  . Smokeless tobacco: Never Used  . Alcohol use No  . Drug use: No  . Sexual activity: Not on file   Other Topics Concern  . Not on file   Social History Narrative   Widowed.  Lives alone.  Ambulates with a cane.    Allergies  Allergen Reactions  . Penicillins Anaphylaxis    Has patient had a PCN reaction causing immediate rash, facial/tongue/throat swelling, SOB or lightheadedness with hypotension: Yes Has patient had a PCN reaction causing severe rash involving mucus membranes or skin necrosis: No Has patient had a PCN reaction that required hospitalization Yes Has patient had a PCN reaction occurring within the last 10 years: No If all of the above answers are "NO", then may proceed with Cephalosporin use.   Marland Kitchen Phenergan [Promethazine Hcl] Other (See Comments)    Restless legs    Family History  Problem Relation Age of Onset  . Cancer Brother     Stomach  . Heart disease Father     Died age 59  . Diabetes Mother   . Heart failure Mother  Died 54    Prior to Admission medications   Medication Sig Start Date End Date Taking? Authorizing Provider  ALPRAZolam (XANAX) 0.25 MG tablet Take 1 tablet (0.25 mg total) by mouth 2 (two) times daily as needed for anxiety. 03/09/16  Yes Calvert Cantor, MD  aspirin EC 81 MG EC tablet Take 1 tablet (81 mg total) by mouth daily. 01/14/16  Yes Arty Baumgartner, NP  bimatoprost (LUMIGAN) 0.01 % SOLN Place 1 drop into the left eye at bedtime.    Yes Historical Provider, MD  calcium-vitamin D 250-100 MG-UNIT per tablet Take 1 tablet by mouth 2 (two) times daily.   Yes Historical Provider, MD    Cholecalciferol (VITAMIN D3) 1000 units CAPS Take 1,000 Units by mouth every morning.   Yes Historical Provider, MD  clopidogrel (PLAVIX) 75 MG tablet Take 1 tablet (75 mg total) by mouth daily. 01/14/16  Yes Arty Baumgartner, NP  dorzolamide (TRUSOPT) 2 % ophthalmic solution Place 1 drop into both eyes 2 (two) times daily.   Yes Historical Provider, MD  esomeprazole (NEXIUM) 40 MG capsule Take 40 mg by mouth daily. 03/19/16  Yes Historical Provider, MD  fluticasone furoate-vilanterol (BREO ELLIPTA) 100-25 MCG/INH AEPB Inhale 1 puff into the lungs daily.   Yes Historical Provider, MD  lisinopril (PRINIVIL,ZESTRIL) 10 MG tablet Take 10 mg by mouth daily.   Yes Historical Provider, MD  OLANZapine-FLUoxetine (SYMBYAX) 3-25 MG capsule Take 1 capsule by mouth every evening.   Yes Historical Provider, MD  oxyCODONE (OXY IR/ROXICODONE) 5 MG immediate release tablet Take 1 tablet (5 mg total) by mouth every 6 (six) hours as needed for severe pain (pain). Patient taking differently: Take 5 mg by mouth every 8 (eight) hours as needed for severe pain (for breakthrough pain).  03/09/16  Yes Calvert Cantor, MD  oxymorphone 10 MG PO T12A 12 hr tablet Take 1 tablet (10 mg total) by mouth every 12 (twelve) hours. 03/09/16  Yes Calvert Cantor, MD  Polyethyl Glycol-Propyl Glycol (SYSTANE ULTRA) 0.4-0.3 % SOLN Place 1 drop into both eyes 2 (two) times daily.   Yes Historical Provider, MD  pramipexole (MIRAPEX) 0.25 MG tablet Take 0.25 mg by mouth at bedtime. 03/19/16  Yes Historical Provider, MD  prednisoLONE acetate (PRED FORTE) 1 % ophthalmic suspension Place 1 drop into the right eye 2 (two) times daily.  01/08/15  Yes Historical Provider, MD  senna (SENOKOT) 8.6 MG TABS tablet Take 2 tablets (17.2 mg total) by mouth at bedtime as needed for mild constipation. Patient taking differently: Take by mouth at bedtime as needed for mild constipation.  03/04/16  Yes Clanford Cyndie Mull, MD  acetaminophen (TYLENOL) 325 MG tablet  Take 2 tablets (650 mg total) by mouth every 6 (six) hours as needed for mild pain (or Fever >/= 101). Patient not taking: Reported on 04/12/2016 03/09/16   Calvert Cantor, MD  atorvastatin (LIPITOR) 40 MG tablet Take 1 tablet (40 mg total) by mouth daily at 6 PM. Patient not taking: Reported on 04/12/2016 01/13/16   Arty Baumgartner, NP  celecoxib (CELEBREX) 200 MG capsule Take 200 mg by mouth daily.     Historical Provider, MD  docusate sodium (COLACE) 100 MG capsule Take 100 mg by mouth 2 (two) times daily.     Historical Provider, MD  feeding supplement, ENSURE ENLIVE, (ENSURE ENLIVE) LIQD Take 237 mLs by mouth 2 (two) times daily between meals. Patient not taking: Reported on 03/28/2016 03/04/16   Clanford Cyndie Mull, MD  metoCLOPramide (  REGLAN) 10 MG tablet Take 1 tablet (10 mg total) by mouth every 6 (six) hours. Patient not taking: Reported on 04/12/2016 03/28/16   Melene Plan, DO  ondansetron (ZOFRAN ODT) 4 MG disintegrating tablet Take 1 tablet (4 mg total) by mouth every 8 (eight) hours as needed for nausea or vomiting. Patient not taking: Reported on 04/12/2016 03/09/16   Calvert Cantor, MD  rOPINIRole (REQUIP) 0.25 MG tablet Take 0.25 mg by mouth daily as needed (for restless legs).    Historical Provider, MD  venlafaxine XR (EFFEXOR-XR) 75 MG 24 hr capsule Take 75 mg by mouth daily. 03/19/16   Historical Provider, MD    Physical Exam: Vitals:   04/12/16 1415 04/12/16 1445 04/12/16 1515 04/12/16 1611  BP: 115/70 122/82 103/64 (!) 149/69  Pulse: 93 95 101 (!) 109  Resp: 17 15 19 18   Temp:    97.7 F (36.5 C)  TempSrc:    Oral  SpO2: 100% 100% 96% 94%  Weight:    50 kg (110 lb 3.2 oz)  Height:    5' (1.524 m)     General: Frail and ill-appearing. Resting in bed. Eyes:  PERRL, EOMI, normal lids, iris ENT:  grossly normal hearing, lips & tongue, mmm Neck:  no LAD, masses or thyromegaly Cardiovascular: 3/6 systolic murmur, regular rate and rhythm, No LE edema.  Respiratory: Few crackles  in the bases. Increased effort. Abdomen:  soft, ntnd, NABS Skin:  no rash or induration seen on limited exam Musculoskeletal:  grossly normal tone BUE/BLE, good ROM, no bony abnormality Psychiatric:  grossly normal mood and affect, speech fluent and appropriate, AOx3 Neurologic:  CN 2-12 grossly intact, moves all extremities in coordinated fashion, sensation intact  Labs on Admission: I have personally reviewed following labs and imaging studies  CBC:  Recent Labs Lab 04/12/16 1118  WBC 13.4*  HGB 11.9*  HCT 37.4  MCV 93.0  PLT 378   Basic Metabolic Panel:  Recent Labs Lab 04/12/16 1118  NA 138  K 3.9  CL 107  CO2 21*  GLUCOSE 135*  BUN 24*  CREATININE 1.37*  CALCIUM 9.6   GFR: Estimated Creatinine Clearance: 21.2 mL/min (by C-G formula based on SCr of 1.37 mg/dL (H)). Liver Function Tests: No results for input(s): AST, ALT, ALKPHOS, BILITOT, PROT, ALBUMIN in the last 168 hours. No results for input(s): LIPASE, AMYLASE in the last 168 hours. No results for input(s): AMMONIA in the last 168 hours. Coagulation Profile: No results for input(s): INR, PROTIME in the last 168 hours. Cardiac Enzymes:  Recent Labs Lab 04/12/16 1150  TROPONINI <0.03   BNP (last 3 results) No results for input(s): PROBNP in the last 8760 hours. HbA1C: No results for input(s): HGBA1C in the last 72 hours. CBG: No results for input(s): GLUCAP in the last 168 hours. Lipid Profile: No results for input(s): CHOL, HDL, LDLCALC, TRIG, CHOLHDL, LDLDIRECT in the last 72 hours. Thyroid Function Tests: No results for input(s): TSH, T4TOTAL, FREET4, T3FREE, THYROIDAB in the last 72 hours. Anemia Panel: No results for input(s): VITAMINB12, FOLATE, FERRITIN, TIBC, IRON, RETICCTPCT in the last 72 hours. Urine analysis:    Component Value Date/Time   COLORURINE YELLOW 04/12/2016 1409   APPEARANCEUR CLOUDY (A) 04/12/2016 1409   LABSPEC 1.025 04/12/2016 1409   PHURINE 5.5 04/12/2016 1409    GLUCOSEU NEGATIVE 04/12/2016 1409   HGBUR SMALL (A) 04/12/2016 1409   BILIRUBINUR NEGATIVE 04/12/2016 1409   KETONESUR 15 (A) 04/12/2016 1409   PROTEINUR NEGATIVE 04/12/2016 1409  UROBILINOGEN 0.2 05/27/2014 1122   NITRITE NEGATIVE 04/12/2016 1409   LEUKOCYTESUR LARGE (A) 04/12/2016 1409    Creatinine Clearance: Estimated Creatinine Clearance: 21.2 mL/min (by C-G formula based on SCr of 1.37 mg/dL (H)).  Sepsis Labs: @LABRCNTIP (procalcitonin:4,lacticidven:4) )No results found for this or any previous visit (from the past 240 hour(s)).   Radiological Exams on Admission: Dg Chest Portable 1 View  Result Date: 04/12/2016 CLINICAL DATA:  Shortness of breath over the last week. EXAM: PORTABLE CHEST 1 VIEW COMPARISON:  03/28/2016 FINDINGS: Chronic left ventricular prominence. Chronic aortic atherosclerosis. The lungs are clear. The vascularity is normal. Chronic scoliosis. Previous right shoulder replacement with subluxation. Chronic degenerative change of the left shoulder. IMPRESSION: No active disease identified.  Chronic findings as above. Electronically Signed   By: Paulina Fusi M.D.   On: 04/12/2016 11:52    EKG: Independently reviewed. Sinus, pvc, no ACS  Assessment/Plan Active Problems:   UTI (urinary tract infection)   Hypotension   HCAP (healthcare-associated pneumonia)   History of MI (myocardial infarction)   Depression with anxiety   Chronic pain syndrome   Hypotension: Likely multifactorial including overall volume depletion and infectious process. Chest x-ray clear but patient presenting with elevated lactic acid, WBC 13.4 with left shift, general fatigue and malaise, abnormal urinalysis. Influenza negative. Suspect patient may have developed pneumonia but is too dry to show on chest x-ray. Additionally patient without urinary complaints despite abnormal urine. History of recurrent UTIs. Patient responded well to 1.5 L of normal saline resuscitation with normalization of  blood pressure. EKG without signs of ACS. Troponin negative 2. BNP 94.3. - Telemetry - IVF - Treatment of presumed pneumonia as below - Urine culture sent - Resume Lisinopril when able  HCAP: Productive sputum, shortness of breath and other findings as outlined above. Will treat conservatively at this time. - Procalcitonin - IV Levaquin (DC VAnc, single dose given in ED) - Pneumonia order set utilized  Depression/ANxiety/RLS: - continue effexor, Requip, Symbyax, Mirapex, Xanax  Chronic pain: - continue home narcotic regimen  CAD/MI: - continue ASA, plavix  GERD: - continue ppi  DVT prophylaxis: Lovenox  Code Status: DNR  Family Communication: son in law  Disposition Plan: pending improvement   Consults called: none  Admission status: obs    MERRELL, DAVID J MD [facility]  If 7PM-7AM, please contact night-coverage [email or web address] Password [email or web address]  04/12/2016, 5:07 PM

## 2016-04-12 NOTE — Progress Notes (Signed)
New pt admission from ED. Pt brought to the floor in stable condition. Vitals taken. Initial Assessment done. All immediate pertinent needs to patient addressed. Patient Guide given to patient. Important safety instructions relating to hospitalization reviewed with patient. Patient verbalized understanding. Will continue to monitor pt.  Jelene Albano RN 

## 2016-04-12 NOTE — ED Triage Notes (Signed)
Pt reports sob that has gotten worse in last few days. Denies cp, but feels sob with any exertion. BP 82/53. Is a x 4, speech is  Clear and concise. Reports has been getting over a cold.

## 2016-04-13 ENCOUNTER — Ambulatory Visit: Payer: Self-pay | Admitting: Student

## 2016-04-13 DIAGNOSIS — I951 Orthostatic hypotension: Secondary | ICD-10-CM | POA: Diagnosis not present

## 2016-04-13 DIAGNOSIS — I252 Old myocardial infarction: Secondary | ICD-10-CM | POA: Diagnosis not present

## 2016-04-13 DIAGNOSIS — N390 Urinary tract infection, site not specified: Secondary | ICD-10-CM

## 2016-04-13 DIAGNOSIS — F418 Other specified anxiety disorders: Secondary | ICD-10-CM | POA: Diagnosis not present

## 2016-04-13 DIAGNOSIS — G894 Chronic pain syndrome: Secondary | ICD-10-CM | POA: Diagnosis not present

## 2016-04-13 LAB — URINE CULTURE: Culture: NO GROWTH

## 2016-04-13 LAB — CBC
HCT: 29.2 % — ABNORMAL LOW (ref 36.0–46.0)
Hemoglobin: 9.4 g/dL — ABNORMAL LOW (ref 12.0–15.0)
MCH: 29.7 pg (ref 26.0–34.0)
MCHC: 32.2 g/dL (ref 30.0–36.0)
MCV: 92.4 fL (ref 78.0–100.0)
Platelets: 293 10*3/uL (ref 150–400)
RBC: 3.16 MIL/uL — ABNORMAL LOW (ref 3.87–5.11)
RDW: 16 % — AB (ref 11.5–15.5)
WBC: 9.5 10*3/uL (ref 4.0–10.5)

## 2016-04-13 LAB — COMPREHENSIVE METABOLIC PANEL
ALBUMIN: 2.6 g/dL — AB (ref 3.5–5.0)
ALK PHOS: 63 U/L (ref 38–126)
ALT: 19 U/L (ref 14–54)
AST: 22 U/L (ref 15–41)
Anion gap: 5 (ref 5–15)
BILIRUBIN TOTAL: 0.6 mg/dL (ref 0.3–1.2)
BUN: 12 mg/dL (ref 6–20)
CALCIUM: 8.9 mg/dL (ref 8.9–10.3)
CO2: 22 mmol/L (ref 22–32)
CREATININE: 0.83 mg/dL (ref 0.44–1.00)
Chloride: 116 mmol/L — ABNORMAL HIGH (ref 101–111)
GFR calc Af Amer: >60 mL/min (ref >60)
GFR calc non Af Amer: >60 mL/min (ref >60)
GLUCOSE: 87 mg/dL (ref 65–99)
Potassium: 4.2 mmol/L (ref 3.5–5.1)
Sodium: 143 mmol/L (ref 135–145)
TOTAL PROTEIN: 5.3 g/dL — AB (ref 6.5–8.1)

## 2016-04-13 NOTE — Progress Notes (Signed)
Pt continues to refuse bed alarm. Will continue to monitor.   Yutaka Holberg, RN  

## 2016-04-13 NOTE — Progress Notes (Signed)
Patient with no complaints or concerns during 7pm - 7am shift. Alert and oriented, slept during the night.   Micheal Sheen, RN 

## 2016-04-13 NOTE — Progress Notes (Signed)
Pharmacy Antibiotic Note Alexis Yates is a 83 y.o. female admitted on 04/12/2016 with pneumonia.  Pharmacy has been consulted for Levaquin dosing. SCr has normalized. Pt is afebrile and WBC is WNL.   Plan: Continue Levaquin 500 mg IV every 48 hours  F/u renal fxn, C&S, clinical status *Pharmacy will sign off and only follow peripherally as no dose adjustments are anticipated.   Height: 5' (152.4 cm) Weight: 108 lb 6.4 oz (49.2 kg) (scale a) IBW/kg (Calculated) : 45.5  Temp (24hrs), Avg:98.1 F (36.7 C), Min:97.7 F (36.5 C), Max:98.4 F (36.9 C)   Recent Labs Lab 04/12/16 1118 04/12/16 1150 04/12/16 1448 04/13/16 0414  WBC 13.4*  --   --  9.5  CREATININE 1.37*  --   --  0.83  LATICACIDVEN  --  2.3* 0.9  --     Estimated Creatinine Clearance: 34.9 mL/min (by C-G formula based on SCr of 0.83 mg/dL).    Allergies  Allergen Reactions  . Penicillins Anaphylaxis    Has patient had a PCN reaction causing immediate rash, facial/tongue/throat swelling, SOB or lightheadedness with hypotension: Yes Has patient had a PCN reaction causing severe rash involving mucus membranes or skin necrosis: No Has patient had a PCN reaction that required hospitalization Yes Has patient had a PCN reaction occurring within the last 10 years: No If all of the above answers are "NO", then may proceed with Cephalosporin use.   Marland Kitchen Phenergan [Promethazine Hcl] Other (See Comments)    Restless legs    Antimicrobials this admission: 1/29 Levaquin  >>  1/29 vancomycin x 1 dose   Microbiology results: 1/29 BCx: px 1/29 Resp panel - NEG  Thank you for allowing pharmacy to be a part of this patient's care.  Lysle Pearl, PharmD, BCPS Pager # [phone] 04/13/2016 11:50 AM

## 2016-04-13 NOTE — Progress Notes (Signed)
Respiratory panel came back negative. Droplet precautions discontinued. Will continue to monitor.  Evva Din, RN

## 2016-04-13 NOTE — Progress Notes (Signed)
[facility]  PROGRESS NOTE  Alexis Yates ZOX:[id number] DOB: 03/12/1928 DOA: 04/12/2016 PCP: Pearla Dubonnet, MD   Brief HPI:   83 y.o. female with medical history significant of  CAD, GERD, HTN, CAD/MI, recurrent UTI, presenting with partially one week history of worsening shortness of breath and productive cough. Patient intermittently dyspneic with exertion prior to that. Symptoms are getting worse. Denies any chest pain, palpitations, abdominal pain, dysuria, frequency, back pain, fevers, nausea, vomiting. Patient does state feeling more tired and run down than usual over the last couple of days. Decreased oral intake.    Subjective   This morning patient feels better.Denies shortness of breath.   Assessment/Plan:     1. Hypotension- resolved, likely multifactorial from volume depletion and questionable infectious process. Chest x-ray is negative for pneumonia, mild elevation of lactic acid, WBC was 13.4, influenza PCR negative. UA abnormal. Patient empirically started on Levaquin. 2. UTI-patient had an abnormal UA, started on Levaquin as above. Urine culture results are pending at this time. 3. Chronic pain syndrome-continue morphine 15 mg by mouth twice a day. 4. Depression/anxiety/restless leg syndrome-continue Mirapex, Effexor, Xanax, Requip 5. Coronary artery disease/history of MI- no recent chest pain, continue aspirin, Plavix 6. Chronic diastolic CHF- echocardiogram showed EF 60-65%, with grade 2 diastolic dysfunction. Patient is not on diuretics at home. Currently euvolemic.  Will discontinue IV fluids tonight. She does have intermittent history of shortness of breath at home, consider starting Lasix if blood pressure remains stable, at the time of discharge.    DVT prophylaxis: Heparin  Code Status: DNR  Family Communication: Discussed with caregiver at bedside  Disposition Plan: Home in next 24 hours if blood pressure remains stable, and cultures are  negative   Consultants:  None  Procedures:  None  Continuous infusions . sodium chloride 75 mL/hr at 04/13/16 8119      Antibiotics:   Anti-infectives    Start     Dose/Rate Route Frequency Ordered Stop   04/14/16 1400  levofloxacin (LEVAQUIN) IVPB 500 mg     500 mg 100 mL/hr over 60 Minutes Intravenous Every 48 hours 04/12/16 1715     04/12/16 1330  vancomycin (VANCOCIN) IVPB 1000 mg/200 mL premix     1,000 mg 200 mL/hr over 60 Minutes Intravenous  Once 04/12/16 1319 04/12/16 1522   04/12/16 1330  levofloxacin (LEVAQUIN) IVPB 750 mg     750 mg 100 mL/hr over 90 Minutes Intravenous  Once 04/12/16 1321 04/12/16 1554       Objective   Vitals:   04/13/16 0621 04/13/16 0723 04/13/16 0807 04/13/16 1225  BP: (!) 145/77  133/74 118/71  Pulse: 92  96 91  Resp: 18   18  Temp: 98 F (36.7 C)  98.3 F (36.8 C) 98 F (36.7 C)  TempSrc: Oral  Oral Oral  SpO2: 98% 95% 96% 95%  Weight: 49.2 kg (108 lb 6.4 oz)     Height:        Intake/Output Summary (Last 24 hours) at 04/13/16 1311 Last data filed at 04/13/16 0900  Gross per 24 hour  Intake          1296.25 ml  Output             2475 ml  Net         -1178.75 ml   Filed Weights   04/12/16 1611 04/13/16 0621  Weight: 50 kg (110 lb 3.2 oz) 49.2 kg (108 lb 6.4 oz)     Physical  Examination:  General exam: Appears calm and comfortable. Respiratory system: Clear to auscultation. Respiratory effort normal. Cardiovascular system:  RRR. No  murmurs, rubs, gallops. No pedal edema. GI system: Abdomen is nondistended, soft and nontender. No organomegaly.  Central nervous system. No focal neurological deficits. 5 x 5 power in all extremities. Skin: No rashes, lesions or ulcers. Psychiatry: Alert, oriented x 3.Judgement and insight appear normal. Affect normal.    Data Reviewed: I have personally reviewed following labs and imaging studies  CBG: No results for input(s): GLUCAP in the last 168 hours.  CBC:  Recent  Labs Lab 04/12/16 1118 04/13/16 0414  WBC 13.4* 9.5  HGB 11.9* 9.4*  HCT 37.4 29.2*  MCV 93.0 92.4  PLT 378 293    Basic Metabolic Panel:  Recent Labs Lab 04/12/16 1118 04/13/16 0414  NA 138 143  K 3.9 4.2  CL 107 116*  CO2 21* 22  GLUCOSE 135* 87  BUN 24* 12  CREATININE 1.37* 0.83  CALCIUM 9.6 8.9    Recent Results (from the past 240 hour(s))  Respiratory Panel by PCR     Status: None   Collection Time: 04/12/16  2:59 PM  Result Value Ref Range Status   Adenovirus NOT DETECTED NOT DETECTED Final   Coronavirus 229E NOT DETECTED NOT DETECTED Final   Coronavirus HKU1 NOT DETECTED NOT DETECTED Final   Coronavirus NL63 NOT DETECTED NOT DETECTED Final   Coronavirus OC43 NOT DETECTED NOT DETECTED Final   Metapneumovirus NOT DETECTED NOT DETECTED Final   Rhinovirus / Enterovirus NOT DETECTED NOT DETECTED Final   Influenza A NOT DETECTED NOT DETECTED Final   Influenza B NOT DETECTED NOT DETECTED Final   Parainfluenza Virus 1 NOT DETECTED NOT DETECTED Final   Parainfluenza Virus 2 NOT DETECTED NOT DETECTED Final   Parainfluenza Virus 3 NOT DETECTED NOT DETECTED Final   Parainfluenza Virus 4 NOT DETECTED NOT DETECTED Final   Respiratory Syncytial Virus NOT DETECTED NOT DETECTED Final   Bordetella pertussis NOT DETECTED NOT DETECTED Final   Chlamydophila pneumoniae NOT DETECTED NOT DETECTED Final   Mycoplasma pneumoniae NOT DETECTED NOT DETECTED Final     Liver Function Tests:  Recent Labs Lab 04/13/16 0414  AST 22  ALT 19  ALKPHOS 63  BILITOT 0.6  PROT 5.3*  ALBUMIN 2.6*   No results for input(s): LIPASE, AMYLASE in the last 168 hours. No results for input(s): AMMONIA in the last 168 hours.  Cardiac Enzymes:  Recent Labs Lab 04/12/16 1150  TROPONINI <0.03   BNP (last 3 results)  Recent Labs  04/12/16 1150  BNP 94.3    ProBNP (last 3 results) No results for input(s): PROBNP in the last 8760 hours.    Studies: Dg Chest Portable 1  View  Result Date: 04/12/2016 CLINICAL DATA:  Shortness of breath over the last week. EXAM: PORTABLE CHEST 1 VIEW COMPARISON:  03/28/2016 FINDINGS: Chronic left ventricular prominence. Chronic aortic atherosclerosis. The lungs are clear. The vascularity is normal. Chronic scoliosis. Previous right shoulder replacement with subluxation. Chronic degenerative change of the left shoulder. IMPRESSION: No active disease identified.  Chronic findings as above. Electronically Signed   By: Paulina Fusi M.D.   On: 04/12/2016 11:52    Scheduled Meds: . aspirin EC  81 mg Oral Daily  . cholecalciferol  1,000 Units Oral q morning - 10a  . clopidogrel  75 mg Oral Daily  . dorzolamide  1 drop Both Eyes BID  . fluticasone furoate-vilanterol  1 puff Inhalation Daily  .  heparin  5,000 Units Subcutaneous Q8H  . [START ON 04/14/2016] levofloxacin (LEVAQUIN) IV  500 mg Intravenous Q48H  . morphine  15 mg Oral Q12H  . OLANZapine-FLUoxetine  1 capsule Oral QPM  . pantoprazole  80 mg Oral Q1200  . pramipexole  0.25 mg Oral QHS  . prednisoLONE acetate  1 drop Right Eye BID  . sodium chloride flush  3 mL Intravenous Q12H      Time spent: 25 min  [facility]   [facility] Pager [phone]. If 7PM-7AM, please contact night-coverage at [email or web address], Office  [phone]  password [email or web address] 04/13/2016, 1:11 PM  LOS: 0 days

## 2016-04-14 DIAGNOSIS — G894 Chronic pain syndrome: Secondary | ICD-10-CM

## 2016-04-14 DIAGNOSIS — I951 Orthostatic hypotension: Secondary | ICD-10-CM

## 2016-04-14 DIAGNOSIS — E860 Dehydration: Secondary | ICD-10-CM | POA: Diagnosis not present

## 2016-04-14 DIAGNOSIS — I519 Heart disease, unspecified: Secondary | ICD-10-CM

## 2016-04-14 DIAGNOSIS — I252 Old myocardial infarction: Secondary | ICD-10-CM

## 2016-04-14 DIAGNOSIS — Q248 Other specified congenital malformations of heart: Secondary | ICD-10-CM

## 2016-04-14 DIAGNOSIS — J189 Pneumonia, unspecified organism: Secondary | ICD-10-CM | POA: Diagnosis not present

## 2016-04-14 LAB — CBC
HCT: 29.6 % — ABNORMAL LOW (ref 36.0–46.0)
HEMOGLOBIN: 9.5 g/dL — AB (ref 12.0–15.0)
MCH: 29.8 pg (ref 26.0–34.0)
MCHC: 32.1 g/dL (ref 30.0–36.0)
MCV: 92.8 fL (ref 78.0–100.0)
Platelets: 315 10*3/uL (ref 150–400)
RBC: 3.19 MIL/uL — AB (ref 3.87–5.11)
RDW: 16.3 % — ABNORMAL HIGH (ref 11.5–15.5)
WBC: 7.8 10*3/uL (ref 4.0–10.5)

## 2016-04-14 LAB — BASIC METABOLIC PANEL
Anion gap: 3 — ABNORMAL LOW (ref 5–15)
BUN: 8 mg/dL (ref 6–20)
CHLORIDE: 113 mmol/L — AB (ref 101–111)
CO2: 23 mmol/L (ref 22–32)
CREATININE: 0.79 mg/dL (ref 0.44–1.00)
Calcium: 9 mg/dL (ref 8.9–10.3)
Glucose, Bld: 102 mg/dL — ABNORMAL HIGH (ref 65–99)
POTASSIUM: 4.2 mmol/L (ref 3.5–5.1)
SODIUM: 139 mmol/L (ref 135–145)

## 2016-04-14 LAB — PROCALCITONIN: Procalcitonin: <0.1 ng/mL

## 2016-04-14 MED ORDER — LEVOFLOXACIN 500 MG PO TABS
500.0000 mg | ORAL_TABLET | ORAL | Status: DC
  Administered 2016-04-14 – 2016-04-16 (×2): 500 mg via ORAL
  Filled 2016-04-14 (×3): qty 1

## 2016-04-14 NOTE — Progress Notes (Signed)
[facility]  PROGRESS NOTE  Alexis Yates YTK:[id number] DOB: 1928-02-07 DOA: 04/12/2016   PCP: Pearla Dubonnet, MD  Brief HPI:   83 y.o. female with medical history significant of  CAD, GERD, HTN, CAD/MI, recurrent UTI, presenting with partially one week history of worsening shortness of breath and productive cough. Patient intermittently dyspneic with exertion prior to that. Symptoms are getting worse. Denies any chest pain, palpitations, abdominal pain, dysuria, frequency, back pain, fevers, nausea, vomiting. Patient does state feeling more tired and run down than usual over the last couple of days. Decreased oral intake.  Subjective   This morning patient feels better.Reports exertional dyspnea, denies orthopnea.    Assessment/Plan:    1. Hypotension- resolved, likely multifactorial from volume depletion and questionable infectious process. Chest x-ray is negative for pneumonia, mild elevation of lactic acid, WBC was 13.4, influenza PCR negative. UA abnormal.  2. UTI-patient had an abnormal UA, started on Levaquin. Urine culture results are pending at this time. 3. Chronic pain syndrome-continue morphine 15 mg by mouth twice a day. 4. Depression/anxiety/restless leg syndrome-continue Mirapex, Effexor, Xanax, Requip 5. Coronary artery disease/history of MI- no recent chest pain, continue aspirin, Plavix 6. Chronic diastolic CHF- echocardiogram showed EF 60-65%, with grade 2 diastolic dysfunction. Patient is not on diuretics at home. Currently euvolemic.  Family asking for cardiology consult given pt's chronic exertional dyspnea. I have consulted cardiology, will follow up on recommendations.  7. Acute kidney injury - from volume depletion, resolved with IVF, stop IVF.    DVT prophylaxis: Heparin  Code Status: DNR  Family Communication: Discussed with daughter at bedside  Disposition Plan: Home in next 24 hours  Consultants:  Cardiology    Procedures:  None  Antibiotics:   Anti-infectives    Start     Dose/Rate Route Frequency Ordered Stop   04/14/16 1400  levofloxacin (LEVAQUIN) IVPB 500 mg  Status:  Discontinued     500 mg 100 mL/hr over 60 Minutes Intravenous Every 48 hours 04/12/16 1715 04/14/16 0841   04/14/16 1000  levofloxacin (LEVAQUIN) tablet 500 mg     500 mg Oral Every 48 hours 04/14/16 0841     04/12/16 1330  vancomycin (VANCOCIN) IVPB 1000 mg/200 mL premix     1,000 mg 200 mL/hr over 60 Minutes Intravenous  Once 04/12/16 1319 04/12/16 1522   04/12/16 1330  levofloxacin (LEVAQUIN) IVPB 750 mg     750 mg 100 mL/hr over 90 Minutes Intravenous  Once 04/12/16 1321 04/12/16 1554       Objective   Vitals:   04/13/16 2359 04/14/16 0501 04/14/16 0741 04/14/16 1324  BP: 119/73 134/73 129/71 112/68  Pulse: 92 80 85 92  Resp: 18 18 18 18   Temp: 98.1 F (36.7 C) 97.4 F (36.3 C) 97.6 F (36.4 C) 98.2 F (36.8 C)  TempSrc: Oral Oral Oral Oral  SpO2: 93% 97% 98% 98%  Weight:  49.4 kg (108 lb 14.4 oz)    Height:        Intake/Output Summary (Last 24 hours) at 04/14/16 1625 Last data filed at 04/14/16 1300  Gross per 24 hour  Intake             1080 ml  Output             1776 ml  Net             -696 ml   Filed Weights   04/12/16 1611 04/13/16 0621 04/14/16 0501  Weight: 50 kg (110 lb  3.2 oz) 49.2 kg (108 lb 6.4 oz) 49.4 kg (108 lb 14.4 oz)     Physical Examination:  General exam: Appears calm and comfortable. Respiratory system: Respiratory effort normal. Cardiovascular system:  RRR. No  murmurs, rubs, gallops. No pedal edema. GI system: Abdomen is nondistended, soft and nontender. No organomegaly.  Central nervous system. No focal neurological deficits. 5 x 5 power in all extremities. Skin: No rashes, lesions or ulcers. Psychiatry: Alert, oriented x 3.Judgement and insight appear normal. Affect normal.  Data Reviewed: I have personally reviewed following labs and imaging  studies  CBG: No results for input(s): GLUCAP in the last 168 hours.  CBC:  Recent Labs Lab 04/12/16 1118 04/13/16 0414 04/14/16 0547  WBC 13.4* 9.5 7.8  HGB 11.9* 9.4* 9.5*  HCT 37.4 29.2* 29.6*  MCV 93.0 92.4 92.8  PLT 378 293 315    Basic Metabolic Panel:  Recent Labs Lab 04/12/16 1118 04/13/16 0414 04/14/16 0547  NA 138 143 139  K 3.9 4.2 4.2  CL 107 116* 113*  CO2 21* 22 23  GLUCOSE 135* 87 102*  BUN 24* 12 8  CREATININE 1.37* 0.83 0.79  CALCIUM 9.6 8.9 9.0    Recent Results (from the past 240 hour(s))  Blood culture (routine x 2)     Status: None (Preliminary result)   Collection Time: 04/12/16 11:53 AM  Result Value Ref Range Status   Specimen Description BLOOD RIGHT FOREARM  Final   Special Requests BOTTLES DRAWN AEROBIC AND ANAEROBIC  5CC  Final   Culture NO GROWTH 2 DAYS  Final   Report Status PENDING  Incomplete  Blood culture (routine x 2)     Status: None (Preliminary result)   Collection Time: 04/12/16  2:11 PM  Result Value Ref Range Status   Specimen Description LEFT ANTECUBITAL  Final   Special Requests BOTTLES DRAWN AEROBIC AND ANAEROBIC 5CC  Final   Culture NO GROWTH 2 DAYS  Final   Report Status PENDING  Incomplete  Respiratory Panel by PCR     Status: None   Collection Time: 04/12/16  2:59 PM  Result Value Ref Range Status   Adenovirus NOT DETECTED NOT DETECTED Final   Coronavirus 229E NOT DETECTED NOT DETECTED Final   Coronavirus HKU1 NOT DETECTED NOT DETECTED Final   Coronavirus NL63 NOT DETECTED NOT DETECTED Final   Coronavirus OC43 NOT DETECTED NOT DETECTED Final   Metapneumovirus NOT DETECTED NOT DETECTED Final   Rhinovirus / Enterovirus NOT DETECTED NOT DETECTED Final   Influenza A NOT DETECTED NOT DETECTED Final   Influenza B NOT DETECTED NOT DETECTED Final   Parainfluenza Virus 1 NOT DETECTED NOT DETECTED Final   Parainfluenza Virus 2 NOT DETECTED NOT DETECTED Final   Parainfluenza Virus 3 NOT DETECTED NOT DETECTED Final    Parainfluenza Virus 4 NOT DETECTED NOT DETECTED Final   Respiratory Syncytial Virus NOT DETECTED NOT DETECTED Final   Bordetella pertussis NOT DETECTED NOT DETECTED Final   Chlamydophila pneumoniae NOT DETECTED NOT DETECTED Final   Mycoplasma pneumoniae NOT DETECTED NOT DETECTED Final  Culture, Urine     Status: None   Collection Time: 04/12/16  6:09 PM  Result Value Ref Range Status   Specimen Description URINE, RANDOM  Final   Special Requests NONE  Final   Culture NO GROWTH  Final   Report Status 04/13/2016 FINAL  Final     Liver Function Tests:  Recent Labs Lab 04/13/16 0414  AST 22  ALT 19  ALKPHOS  63  BILITOT 0.6  PROT 5.3*  ALBUMIN 2.6*   No results for input(s): LIPASE, AMYLASE in the last 168 hours. No results for input(s): AMMONIA in the last 168 hours.  Cardiac Enzymes:  Recent Labs Lab 04/12/16 1150  TROPONINI <0.03   BNP (last 3 results)  Recent Labs  04/12/16 1150  BNP 94.3   Studies: No results found.  Scheduled Meds: . aspirin EC  81 mg Oral Daily  . cholecalciferol  1,000 Units Oral q morning - 10a  . clopidogrel  75 mg Oral Daily  . dorzolamide  1 drop Both Eyes BID  . fluticasone furoate-vilanterol  1 puff Inhalation Daily  . heparin  5,000 Units Subcutaneous Q8H  . levofloxacin  500 mg Oral Q48H  . morphine  15 mg Oral Q12H  . OLANZapine-FLUoxetine  1 capsule Oral QPM  . pantoprazole  80 mg Oral Q1200  . pramipexole  0.25 mg Oral QHS  . prednisoLONE acetate  1 drop Right Eye BID  . sodium chloride flush  3 mL Intravenous Q12H    Time spent: 25 min  Debbora Presto, MD   [facility] Pager [phone]. If 7PM-7AM, please contact night-coverage at [email or web address], Office  [phone]  password [email or web address] 04/14/2016, 4:25 PM  LOS: 0 days

## 2016-04-14 NOTE — Progress Notes (Signed)
Patient alert and oriented, sleep during the night, no issues or concerns on the night shift.  Annye Forrey, RN

## 2016-04-14 NOTE — Progress Notes (Signed)
Pt's daughter irritable demanding RN to call MD to ask where cardiology MD is.  Pt's daughter stated,"I have been waiting here all day for cardiology".  Dr. Izola Price called and informed RN that cardiology has indeed been consulted this AM and will be making their rounds.  RN informed family, will continue to monitor.

## 2016-04-14 NOTE — Consult Note (Signed)
CARDIOLOGY CONSULT NOTE   Patient ID: AIMI ESSNER MRN: [id number], DOB/AGE: [birth year]   Admit date: 04/12/2016 Date of Consult: 04/14/2016   Primary Physician: Pearla Dubonnet, MD Primary Cardiologist: Dr. Algie Coffer by Dr. Izola Price for diastolic dysfunction and diuretic need.  Pt. Profile  Mrs. Schiffer is a frail 83 year old Caucasian female with past medical history of hypertension, GERD, severe bladder prolapse managed by the surgery, and recently diagnosed CAD s/p DES to RCA presented with weakness and dehydration. Found to have via hypotension on arrival. Symptoms improved with hydration. Cardiology has been consulted for diastolic dysfunction.  Problem List  Past Medical History:  Diagnosis Date  . Arthritis   . Atherosclerosis of aorta (HCC)   . CAD (coronary artery disease)    a. 10/27 PCI with DES to RCA, diffuse nonobstructive disease, EF 50-55%  . Chronic pain   . Diverticulosis   . Elevated transaminase level   . GERD (gastroesophageal reflux disease)   . Glaucoma   . Hiatal hernia   . Hypertension   . Lung nodules    Right  . MI (myocardial infarction) 01/09/2016  . Scoliosis   . UTI (urinary tract infection)    refractory    Past Surgical History:  Procedure Laterality Date  . ABDOMINAL HYSTERECTOMY    . CARDIAC CATHETERIZATION N/A 01/09/2016   Procedure: Left Heart Cath and Coronary Angiography;  Surgeon: Marykay Lex, MD;  Location: [facility] INVASIVE CV LAB;  Service: Cardiovascular;  Laterality: N/A;  . CARDIAC CATHETERIZATION N/A 01/09/2016   Procedure: Coronary Stent Intervention;  Surgeon: Marykay Lex, MD;  Location: [facility] INVASIVE CV LAB;  Service: Cardiovascular;  Laterality: N/A;  . EYE SURGERY    . FRACTURE SURGERY     Tibia/fibula of right leg     Allergies  Allergies  Allergen Reactions  . Penicillins Anaphylaxis    Has patient had a PCN reaction causing immediate rash, facial/tongue/throat swelling, SOB or lightheadedness  with hypotension: Yes Has patient had a PCN reaction causing severe rash involving mucus membranes or skin necrosis: No Has patient had a PCN reaction that required hospitalization Yes Has patient had a PCN reaction occurring within the last 10 years: No If all of the above answers are "NO", then may proceed with Cephalosporin use.   Marland Kitchen Phenergan [Promethazine Hcl] Other (See Comments)    Restless legs    HPI   Mrs. Lalli is a frail 83 year old Caucasian female with past medical history of hypertension, GERD, severe bladder prolapse managed by the surgery, and recently diagnosed CAD s/p DES to RCA. She presented in late October with burning sensation in her chest and also weakness. EKG showed possible inferior Q waves and T-wave inversion. Troponin was elevated, it was felt that her ACS likely occurred for more than 24 hours prior to presentation. She was taken urgently to the cath lab, this showed 100% mid RCA occlusion treated with Promus premiere 2.25 x 28 mm DES, EF 50-55% by visual estimate. She also had 40% ostial to proximal LAD lesion, 70% OM1 lesion, circumflex 90% ostial D2 lesion which was very small vessel. Post procedure she was placed on aspirin, Plavix and the statin. Was also noted post cath that she had a very loud murmur. Echo cart gram obtained on 01/11/2016 showed EF 60-65% obstruction at rest in the outflow tract with peak gradient 108 mmHg, dynamic obstruction during Valsalva with peak gradient 138 mmHg, grade 2 diastolic dysfunction, mild MR, PA peak pressure 35 mmHg.  I last saw the patient in the office on 01/23/2016, at which time she denies any chest discomfort. 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 Unfortunately since then, she has been admitted at least 4 times. From 12/17-12/21, she was treated for UTI and also possible diverticulitis. She was readmitted on 03/06/2016 for the same thing and underwent another course of antibiotic therapy. Since then she has  been seen in the emergency room on 03/15/2016 and 03/28/2016 for recurrent UTI. Over the past several weeks, she had progressive weakness and shortness of breath with exertion. She denies any obvious chest pain. According to the daughter, she also recently recovered from a cold-like symptom. She was coughing up productive cough, however after a course of antibiotic therapy/azithromycin, she did have some recovery. The shortness of breath, however persisted. She also says she is quite weak at home as well. She eventually sought medical attention at [facility] on 04/12/2016. On initial arrival, she was hypotensive with systolic blood pressure in the 70s. CBC was mildly elevated at 13.4. Lactate acid mildly elevated at 2.3. Cr was also elevated at 1.37. Troponin negative. Cardiology has been consulted for hypotension and also dysfunction.    Inpatient Medications  . aspirin EC  81 mg Oral Daily  . cholecalciferol  1,000 Units Oral q morning - 10a  . clopidogrel  75 mg Oral Daily  . dorzolamide  1 drop Both Eyes BID  . fluticasone furoate-vilanterol  1 puff Inhalation Daily  . heparin  5,000 Units Subcutaneous Q8H  . levofloxacin  500 mg Oral Q48H  . morphine  15 mg Oral Q12H  . OLANZapine-FLUoxetine  1 capsule Oral QPM  . pantoprazole  80 mg Oral Q1200  . pramipexole  0.25 mg Oral QHS  . prednisoLONE acetate  1 drop Right Eye BID  . sodium chloride flush  3 mL Intravenous Q12H    Family History Family History  Problem Relation Age of Onset  . Cancer Brother     Stomach  . Heart disease Father     Died age 20  . Diabetes Mother   . Heart failure Mother     Died 25     Social History Social History   Social History  . Marital status: Widowed    Spouse name: N/A  . Number of children: 2  . Years of education: N/A   Occupational History  . Retired from school system    Social History Main Topics  . Smoking status: Former Games developer  . Smokeless tobacco: Never Used  . Alcohol  use No  . Drug use: No  . Sexual activity: Not on file   Other Topics Concern  . Not on file   Social History Narrative   Widowed.  Lives alone.  Ambulates with a cane.     Review of Systems  General:  No chills, fever, night sweats or weight changes.  Cardiovascular:  No chest pain, dyspnea on exertion, edema, orthopnea, palpitations, paroxysmal nocturnal dyspnea. Dermatological: No rash, lesions/masses Respiratory: +cough, dyspnea Urologic: No hematuria, dysuria Abdominal:   No nausea, vomiting, diarrhea, bright red blood per rectum, melena, or hematemesis Neurologic:  No visual changes, changes in mental status. +wkns All other systems reviewed and are otherwise negative except as noted above.  Physical Exam  Blood pressure 112/68, pulse 92, temperature 98.2 F (36.8 C), temperature source Oral, resp. rate 18, height 5' (1.524 m), weight 108 lb 14.4 oz (49.4 kg), SpO2 98 %.  General: Pleasant, NAD Psych: Normal  affect. Neuro: Alert and oriented X 3. Moves all extremities spontaneously. HEENT: Normal  Neck: Supple without bruits or JVD. Lungs:  Resp regular and unlabored, CTA. Heart: RRR no s3, s4. 3/6 murmurs. Abdomen: Soft, non-tender, non-distended, BS + x 4.  Extremities: No clubbing, cyanosis or edema. DP/PT/Radials 2+ and equal bilaterally.  Labs   Recent Labs  04/12/16 1150  TROPONINI <0.03   Lab Results  Component Value Date   WBC 7.8 04/14/2016   HGB 9.5 (L) 04/14/2016   HCT 29.6 (L) 04/14/2016   MCV 92.8 04/14/2016   PLT 315 04/14/2016    Recent Labs Lab 04/13/16 0414 04/14/16 0547  NA 143 139  K 4.2 4.2  CL 116* 113*  CO2 22 23  BUN 12 8  CREATININE 0.83 0.79  CALCIUM 8.9 9.0  PROT 5.3*  --   BILITOT 0.6  --   ALKPHOS 63  --   ALT 19  --   AST 22  --   GLUCOSE 87 102*   Lab Results  Component Value Date   CHOL 148 01/09/2016   HDL 75 01/09/2016   LDLCALC 69 01/09/2016   TRIG 21 01/09/2016   No results found for:  DDIMER  Radiology/Studies  Dg Chest 2 View  Result Date: 03/28/2016 CLINICAL DATA:  Acute onset of dysuria. Patient has not voided recently. Initial encounter. EXAM: CHEST  2 VIEW COMPARISON:  Chest radiograph performed 03/15/2016 FINDINGS: The lungs are well-aerated. Mild left basilar atelectasis is noted. There is no evidence of pleural effusion or pneumothorax. The heart is borderline enlarged. No acute osseous abnormalities are seen. Right convex thoracic scoliosis is noted. The patient's right shoulder arthroplasty is chronically superiorly subluxed. IMPRESSION: 1. Mild left basilar atelectasis noted.  Borderline cardiomegaly. 2. Right convex thoracic scoliosis noted. Electronically Signed   By: Roanna Raider M.D.   On: 03/28/2016 01:53   Dg Chest Portable 1 View  Result Date: 04/12/2016 CLINICAL DATA:  Shortness of breath over the last week. EXAM: PORTABLE CHEST 1 VIEW COMPARISON:  03/28/2016 FINDINGS: Chronic left ventricular prominence. Chronic aortic atherosclerosis. The lungs are clear. The vascularity is normal. Chronic scoliosis. Previous right shoulder replacement with subluxation. Chronic degenerative change of the left shoulder. IMPRESSION: No active disease identified.  Chronic findings as above. Electronically Signed   By: Paulina Fusi M.D.   On: 04/12/2016 11:52    ECG  Normal sinus rhythm without significant ST-T wave changes.  ASSESSMENT AND PLAN  1. Diastolic dysfunction without heart failure  - Her LV outlet obstruction complicate the issue, she is more prone to dehydration during infection. Would not recommend diuretic in this case as it would make her LV outlet obstruction worse and causes severe hypotensive episodes.  - We do agree with holding lisinopril, once her condition improved, may consider low-dose beta blocker to slow down the heart rate and improve filling of LV.  2. Hypotension: Her hypotension likely is related to recent UTI, dehydration, and also LV  outlet obstruction. This is evident by her AKI on arrival, fortunately this has improved with hydration.  3. Recurrent UTI: She has went to multiple course of UTI in the recent month, she does have a pessary for severe bladder prolapse. We'll defer to internal medicine service for further workup.   Signed, Azalee Course, PA-C 04/14/2016, 5:43 PM   I have seen, examined and evaluated the patient this PM along with Azalee Course, PA.  After reviewing all the available data and chart, we discussed the patients  laboratory, study & physical findings as well as symptoms in detail. I agree with his findings, examination as well as impression recommendations as per our discussion.    I have not seen Ms. Morejon since her cardiac catheterization, she is only been seen once in follow-up. It would appear that her echocardiogram shows a pretty significant LVOT gradient with diastolic dysfunction. She has not as far as I know had any diastolic heart failure issues. In fact, she has had several hospitalizations for UTI and other issues and made her hypotensive. With significant LVOT gradient, she is very sensitive to the dehydrated. I suspect that what happens is she does not eat and drink well and not feeling well, and therefore she has less LV filling with worsening output.  Appropriate treatment is case was 2 give her IV fluids which has helped her blood pressure and is also helped her feel better by increasing her cardiac. Low filling pressures and low filling volumes will allow for left intra-articular collapse and therefore decreased cardiac output. I talked about the importance of staying adequately hydrated which means 10-12 10 ounce glasses of water a day with equivalent thereof. When not feeling well, she should try to eat soup which gives her hydration and nutrition the same meal.  We talked about the importance of having an adequate meal to go along with hydration in order to maintain blood volume.  At this point,  I agree with holding antihypertensives. I would not restart ACE inhibitor. Beta blocker may be reasonable once she has proven that she can remain stable for at least a month. The intention being to allow for better filling time.  We will follow along tomorrow, but not much more to offer as she seems to be doing much better.    Bryan Lemma, M.D., M.S. Interventional Cardiologist   Pager # [phone] Phone # [phone] [address] [place], [place] [address]

## 2016-04-15 DIAGNOSIS — I252 Old myocardial infarction: Secondary | ICD-10-CM | POA: Diagnosis not present

## 2016-04-15 DIAGNOSIS — J189 Pneumonia, unspecified organism: Secondary | ICD-10-CM | POA: Diagnosis not present

## 2016-04-15 DIAGNOSIS — I959 Hypotension, unspecified: Secondary | ICD-10-CM | POA: Diagnosis not present

## 2016-04-15 DIAGNOSIS — I519 Heart disease, unspecified: Secondary | ICD-10-CM | POA: Diagnosis not present

## 2016-04-15 DIAGNOSIS — Q248 Other specified congenital malformations of heart: Secondary | ICD-10-CM | POA: Diagnosis not present

## 2016-04-15 LAB — BASIC METABOLIC PANEL
ANION GAP: 6 (ref 5–15)
BUN: 11 mg/dL (ref 6–20)
CALCIUM: 9.2 mg/dL (ref 8.9–10.3)
CO2: 24 mmol/L (ref 22–32)
Chloride: 111 mmol/L (ref 101–111)
Creatinine, Ser: 0.88 mg/dL (ref 0.44–1.00)
GFR calc Af Amer: >60 mL/min (ref >60)
GFR, EST NON AFRICAN AMERICAN: 58 mL/min — AB (ref >60)
GLUCOSE: 115 mg/dL — AB (ref 65–99)
POTASSIUM: 4.4 mmol/L (ref 3.5–5.1)
Sodium: 141 mmol/L (ref 135–145)

## 2016-04-15 LAB — CBC
HCT: 30.9 % — ABNORMAL LOW (ref 36.0–46.0)
Hemoglobin: 9.8 g/dL — ABNORMAL LOW (ref 12.0–15.0)
MCH: 29.3 pg (ref 26.0–34.0)
MCHC: 31.7 g/dL (ref 30.0–36.0)
MCV: 92.2 fL (ref 78.0–100.0)
PLATELETS: 352 10*3/uL (ref 150–400)
RBC: 3.35 MIL/uL — ABNORMAL LOW (ref 3.87–5.11)
RDW: 16.1 % — AB (ref 11.5–15.5)
WBC: 8.5 10*3/uL (ref 4.0–10.5)

## 2016-04-15 LAB — LEGIONELLA PNEUMOPHILA SEROGP 1 UR AG: L. PNEUMOPHILA SEROGP 1 UR AG: NEGATIVE

## 2016-04-15 NOTE — Care Management Note (Signed)
Case Management Note  Patient Details  Name: Alexis Yates MRN: [id number] Date of Birth: Apr 16, 1927  Subjective/Objective:   Admitted to Observation for Hypotension                 Action/Plan: Patient lives at home with her daughter; private insurance with [facility] with prescription drug coverage; patient reports no problem getting her medication; She goes to Outpatient Physical Therapy at [facility] and stated that she would continue to do that and does not want any HHC at this time.CM will continue to follow for DCP  Expected Discharge Date:     04/16/2016             Expected Discharge Plan:  Home w Home Health Services  Discharge planning Services  CM Consult    [facility] Arranged:  Patient Refused    Status of Service:  In process, will continue to follow  Reola Mosher [phone] 04/15/2016, 3:22 PM

## 2016-04-15 NOTE — Progress Notes (Signed)
Progress Note  Patient Name: Alexis Yates Date of Encounter: 04/15/2016  Primary Cardiologist: Dr. Herbie Baltimore   Subjective   Feels much better today, has gotten to and from the bathroom without dizziness. Denies chest pain.   Inpatient Medications    Scheduled Meds: . aspirin EC  81 mg Oral Daily  . cholecalciferol  1,000 Units Oral q morning - 10a  . clopidogrel  75 mg Oral Daily  . dorzolamide  1 drop Both Eyes BID  . fluticasone furoate-vilanterol  1 puff Inhalation Daily  . heparin  5,000 Units Subcutaneous Q8H  . levofloxacin  500 mg Oral Q48H  . morphine  15 mg Oral Q12H  . OLANZapine-FLUoxetine  1 capsule Oral QPM  . pantoprazole  80 mg Oral Q1200  . pramipexole  0.25 mg Oral QHS  . prednisoLONE acetate  1 drop Right Eye BID  . sodium chloride flush  3 mL Intravenous Q12H   Continuous Infusions:  PRN Meds: acetaminophen **OR** acetaminophen, ALPRAZolam, hydroxypropyl methylcellulose / hypromellose, ondansetron **OR** ondansetron (ZOFRAN) IV, oxyCODONE, senna   Vital Signs    Vitals:   04/14/16 0741 04/14/16 1324 04/14/16 1923 04/15/16 0510  BP: 129/71 112/68 120/63 117/70  Pulse: 85 92 99 72  Resp: 18 18 16 16   Temp: 97.6 F (36.4 C) 98.2 F (36.8 C) 97.9 F (36.6 C) 97.7 F (36.5 C)  TempSrc: Oral Oral Oral Oral  SpO2: 98% 98% 96% 96%  Weight:    108 lb 12.8 oz (49.4 kg)  Height:        Intake/Output Summary (Last 24 hours) at 04/15/16 1340 Last data filed at 04/15/16 0949  Gross per 24 hour  Intake              686 ml  Output             1000 ml  Net             -314 ml   Filed Weights   04/13/16 0621 04/14/16 0501 04/15/16 0510  Weight: 108 lb 6.4 oz (49.2 kg) 108 lb 14.4 oz (49.4 kg) 108 lb 12.8 oz (49.4 kg)    Telemetry    NSR - Personally Reviewed  ECG    NSR - Personally Reviewed  Physical Exam   GEN: No acute distress.   Neck: No JVD Cardiac: RRR. 3/6 systolic murmur, rubs, or gallops.  Respiratory: Clear to auscultation  bilaterally. GI: Soft, nontender, non-distended  MS: No edema; No deformity. Neuro:  Nonfocal  Psych: Normal affect   Labs    Chemistry Recent Labs Lab 04/13/16 0414 04/14/16 0547 04/15/16 0447  NA 143 139 141  K 4.2 4.2 4.4  CL 116* 113* 111  CO2 22 23 24   GLUCOSE 87 102* 115*  BUN 12 8 11   CREATININE 0.83 0.79 0.88  CALCIUM 8.9 9.0 9.2  PROT 5.3*  --   --   ALBUMIN 2.6*  --   --   AST 22  --   --   ALT 19  --   --   ALKPHOS 63  --   --   BILITOT 0.6  --   --   GFRNONAA >60 >60 58*  GFRAA >60 >60 >60  ANIONGAP 5 3* 6     Hematology Recent Labs Lab 04/13/16 0414 04/14/16 0547 04/15/16 0447  WBC 9.5 7.8 8.5  RBC 3.16* 3.19* 3.35*  HGB 9.4* 9.5* 9.8*  HCT 29.2* 29.6* 30.9*  MCV 92.4 92.8 92.2  MCH 29.7 29.8 29.3  MCHC 32.2 32.1 31.7  RDW 16.0* 16.3* 16.1*  PLT 293 315 352    Cardiac Enzymes Recent Labs Lab 04/12/16 1150  TROPONINI <0.03    Recent Labs Lab 04/12/16 1146  TROPIPOC 0.01     BNP Recent Labs Lab 04/12/16 1150  BNP 94.3     DDimer No results for input(s): DDIMER in the last 168 hours.   Radiology    No results found.  Cardiac Studies   Transthoracic Echocardiography 04/15/16 Study Conclusions  - Left ventricle: The cavity size was normal. There was moderate   focal basal hypertrophy. Systolic function was normal. The   estimated ejection fraction was in the range of 60% to 65%. There   was dynamic obstruction at restin the outflow tract, with a peak   gradient of 108 mm Hg. There was dynamic obstruction during   [place] the outflow tract, with a peak gradient of 138 mm Hg.   Wall motion was normal; there were no regional wall motion   abnormalities. Features are consistent with a pseudonormal left   ventricular filling pattern, with concomitant abnormal relaxation   and increased filling pressure (grade 2 diastolic dysfunction).   Doppler parameters are consistent with high ventricular filling   pressure. - Aortic  valve: Trileaflet; mildly thickened, mildly calcified   leaflets. There was trivial regurgitation. - Mitral valve: Moderately calcified annulus. Mild focal   calcification of the anterior leaflet (lateral segment(s)) and   posterior leaflet (lateral scallop(s)). There was severe systolic   anterior motion of the anterior leaflet. There was mild   regurgitation. - Left atrium: The atrium was mildly dilated. - Atrial septum: There was increased thickness of the septum,   consistent with lipomatous hypertrophy. - Pulmonary arteries: PA peak pressure: 35 mm Hg (S).  Impressions:  - The right ventricular systolic pressure was increased consistent   with mild pulmonary hypertension.  Patient Profile     83 y.o. female caucasian female with past medical history of hypertension, GERD, severe bladder prolapse managed by the surgery, and recently diagnosed CAD s/p DES to RCA. Assessment & Plan    1. Diastolic dysfunction without heart failure             - Her LV outlet obstruction complicates the issue, she is more prone to dehydration during infection. Would not recommend diuretic in this case as it would make her LV outlet obstruction worse and causes severe hypotensive episodes.             - We do agree with holding lisinopril, once her condition improved, may consider low-dose beta blocker to slow down the heart rate and improve filling of LV.  2. Hypotension: Her hypotension likely is related to recent UTI, dehydration, and also LV outlet obstruction. This is evident by her AKI on arrival, fortunately this has improved with hydration.  3. Recurrent UTI: She has went to multiple course of UTI in the recent month, she does have a pessary for severe bladder prolapse. We'll defer to internal medicine service for further workup.    Signed, Little Ishikawa, NP  04/15/2016, 1:40 PM     I have seen, examined and evaluated the patient this PM along with Suzzette Righter, NP.  After reviewing all  the available data and chart, we discussed the patients laboratory, study & physical findings as well as symptoms in detail. I agree with her findings, examination as well as impression recommendations as per our discussion.  Much more stable now that her blood pressures improved. She probably does have some mild exertional dyspnea, which does go along with her deconditioning as well as possibly related to her diastolic dysfunction. I don't think she actually has heart failure because she is not orthopneic. Please see detailed discussion from yesterday. Had held her antihypertensives. But if her blood pressures otherwise out, we can consider restarting a beta blocker.  Essentially she needs stay adequately hydrated. This is difficult with her recurrent illnesses.    Bryan Lemma, M.D., M.S. Interventional Cardiologist   Pager # [phone] Phone # [phone] [address] [place], [place] [address]

## 2016-04-15 NOTE — Evaluation (Signed)
Occupational Therapy Evaluation and Discharge Patient Details Name: Alexis Yates MRN: [id number] DOB: December 12, 1927 Today's Date: 04/15/2016    History of Present Illness 83 y.o. female with medical history significant of  CAD, GERD, HTN, CAD/MI, recurrent UTI, presenting with partially one week history of worsening shortness of breath and productive cough.    Clinical Impression   Pt is likely very near basline in self care and mobility, but desires to potentially return to living alone. Pt demonstrates limited UE function from arthritis, impaired activity tolerance and impaired standing balance. Recommending OPOT to address IADL and adaptive equipment needs.     Follow Up Recommendations  Outpatient OT (Pt is currently going to OPPT)    Equipment Recommendations  None recommended by OT    Recommendations for Other Services       Precautions / Restrictions Precautions Precautions: Fall Precaution Comments: monitor HR Restrictions Weight Bearing Restrictions: No      Mobility Bed Mobility               General bed mobility comments: up in chair upon arrival  Transfers Overall transfer level: Needs assistance Equipment used: Rolling walker (2 wheeled) Transfers: Sit to/from Stand Sit to Stand: Supervision         General transfer comment: from chair    Balance Overall balance assessment: Needs assistance   Sitting balance-Leahy Scale: Good     Standing balance support: During functional activity Standing balance-Leahy Scale: Fair Standing balance comment: Able to stand at sink and brush teeth                            ADL Overall ADL's : Needs assistance/impaired Eating/Feeding: Set up;Sitting   Grooming: Standing;Oral care;Supervision/safety   Upper Body Bathing: Minimal assistance;Sitting   Lower Body Bathing: Sitting/lateral leans;Supervison/ safety   Upper Body Dressing : Minimal assistance;Sitting   Lower Body Dressing: Sit  to/from stand;Supervision/safety   Toilet Transfer: Ambulation;RW;Min guard   Toileting- Clothing Manipulation and Hygiene: Minimal assistance;Sit to/from stand       Functional mobility during ADLs: Minimal assistance;Rolling walker General ADL Comments: Educated pt in availability of extended comb/brush as she has concerns about being able to reach the back of her head. Pt goes to the beauty shop weekly.     Vision     Perception     Praxis      Pertinent Vitals/Pain Pain Assessment: Faces Faces Pain Scale: Hurts even more Pain Location: R knee Pain Descriptors / Indicators: Grimacing Pain Intervention(s): Monitored during session (RN aware)     Hand Dominance Right   Extremity/Trunk Assessment Upper Extremity Assessment Upper Extremity Assessment: RUE deficits/detail;LUE deficits/detail RUE Deficits / Details: s/p TSA with limited shoulder ROM longstanding, arthritic changes in hane RUE Coordination: decreased gross motor LUE Deficits / Details: limited ER longstanding, pt reports she has been told her shoulder was bone-on-bone LUE Coordination: decreased gross motor   Lower Extremity Assessment Lower Extremity Assessment: Defer to PT evaluation   Cervical / Trunk Assessment Cervical / Trunk Assessment: Kyphotic   Communication Communication Communication: No difficulties   Cognition Arousal/Alertness: Awake/alert Behavior During Therapy: WFL for tasks assessed/performed Overall Cognitive Status: Within Functional Limits for tasks assessed                     General Comments       Exercises       Shoulder Instructions      Home  Living Family/patient expects to be discharged to:: Private residence Living Arrangements: Children (daughter) Available Help at Discharge: Family;Available 24 hours/day;Personal care attendant Type of Home: House Home Access: Stairs to enter [employer] of Steps: 3   Home Layout: One level      Bathroom Shower/Tub: Tub/shower unit Shower/tub characteristics: Curtain Firefighter: Standard     Home Equipment: Environmental consultant - 4 wheels;Cane - single point;Bedside commode;Tub bench   Additional Comments: Pt normally lives alone in 1 level home with ramped entry, but has been staying with daughter and plans on returning to her house. House info based on daughter's house. Pt has 6 hours of care from a paid caregiver.      Prior Functioning/Environment Level of Independence: Needs assistance  Gait / Transfers Assistance Needed: mostly walks hold the furniture or uses a cane ADL's / Homemaking Assistance Needed: caregiver supervises shower, lays clothing out and performs meal prep   Comments: Pt has been living with her daughter since her last admission.        OT Problem List: Decreased range of motion;Impaired balance (sitting and/or standing);Impaired UE functional use;Pain   OT Treatment/Interventions:      OT Goals(Current goals can be found in the care plan section) Acute Rehab OT Goals Patient Stated Goal: return eventually to her own home  OT Frequency:     Barriers to D/C:            Co-evaluation              End of Session Equipment Utilized During Treatment: Rolling walker;Gait belt  Activity Tolerance: Patient tolerated treatment well Patient left: in chair;with call bell/phone within reach;with family/visitor present   Time: 4496-7591 OT Time Calculation (min): 29 min Charges:  OT General Charges $OT Visit: 1 Procedure OT Evaluation $OT Eval Moderate Complexity: 1 Procedure OT Treatments $Self Care/Home Management : 8-22 mins G-Codes: OT G-codes **NOT FOR INPATIENT CLASS** Functional Assessment Tool Used: clinical judgement Functional Limitation: Self care Self Care Current Status (M3846): At least 20 percent but less than 40 percent impaired, limited or restricted Self Care Discharge Status [phone]): At least 20 percent but less than 40 percent  impaired, limited or restricted  Evern Bio 04/15/2016, 3:32 PM [phone]

## 2016-04-15 NOTE — Evaluation (Signed)
Physical Therapy Evaluation Patient Details Name: Alexis Yates MRN: [id number] DOB: 1927-04-08 Today's Date: 04/15/2016   History of Present Illness  83 y.o. female with medical history significant of  CAD, GERD, HTN, CAD/MI, recurrent UTI, presenting with partially one week history of worsening shortness of breath and productive cough.   Clinical Impression  Pt admitted with above diagnosis. Pt currently with functional limitations due to the deficits listed below (see PT Problem List).  Pt will benefit from skilled PT to increase their independence and safety with mobility to allow discharge to the venue listed below.  Pt ambulated 140' with RW and HR up to 144 bpm with 2/4 dyspnea. O2 98% on room air. Pt has been staying with daughter where she has 24 hour S.  She plans on returning there until she gets strong enough to return to her home. Recommend HHPT to continue working with pt with her ultimate goal of returning home independently.     Follow Up Recommendations Home health PT;Supervision for mobility/OOB    Equipment Recommendations  None recommended by PT    Recommendations for Other Services       Precautions / Restrictions Precautions Precautions: Other (comment) Precaution Comments: monitor HR      Mobility  Bed Mobility               General bed mobility comments: up in chair upon arrival  Transfers Overall transfer level: Needs assistance   Transfers: Sit to/from Stand Sit to Stand: Supervision         General transfer comment: stood from toilet and from chair  Ambulation/Gait Ambulation/Gait assistance: Min guard Ambulation Distance (Feet): 140 Feet Assistive device: Rolling walker (2 wheeled)       General Gait Details: Ambulated in room with HHA, but then when began ambulation to hallway pt felt she needed RW for support. Reports she doesn't need it at home, because she moves more and doesn't get this stiff.  HR up to 144 with gait.  Cardiologist informed.  Stairs            Wheelchair Mobility    Modified Rankin (Stroke Patients Only)       Balance Overall balance assessment: Needs assistance   Sitting balance-Leahy Scale: Good     Standing balance support: During functional activity Standing balance-Leahy Scale: Fair Standing balance comment: Able to stand at sink and brush teeth                             Pertinent Vitals/Pain Pain Assessment: Faces Faces Pain Scale: Hurts even more Pain Location: R knee Pain Descriptors / Indicators: Grimacing Pain Intervention(s): Monitored during session;Limited activity within patient's tolerance;Patient requesting pain meds-RN notified;Repositioned    Home Living Family/patient expects to be discharged to:: Private residence Living Arrangements: Children Available Help at Discharge: Family;Available 24 hours/day Type of Home: House Home Access: Stairs to enter   [employer] of Steps: 3 Home Layout: One level Home Equipment: Walker - 4 wheels;Cane - single point;Bedside commode Additional Comments: Pt normally lives alone in 1 level home with ramped entry, but has been staying with daughter and plans on returning to her house. house inof based on daughter's house.    Prior Function Level of Independence: Independent with assistive device(s)         Comments: Ambulates with cane or nothing at all. Cruises furniture. states son in law with her on entry stairs and normally doesn't  leave the house     Hand Dominance        Extremity/Trunk Assessment   Upper Extremity Assessment Upper Extremity Assessment: Overall WFL for tasks assessed    Lower Extremity Assessment Lower Extremity Assessment: Overall WFL for tasks assessed;Generalized weakness    Cervical / Trunk Assessment Cervical / Trunk Assessment: Kyphotic  Communication   Communication: No difficulties  Cognition Arousal/Alertness: Awake/alert Behavior  During Therapy: WFL for tasks assessed/performed Overall Cognitive Status: Within Functional Limits for tasks assessed                      General Comments General comments (skin integrity, edema, etc.): cardiologist in at end of session    Exercises     Assessment/Plan    PT Assessment Patient needs continued PT services  PT Problem List Decreased strength;Decreased activity tolerance;Decreased balance;Decreased knowledge of use of DME;Decreased mobility          PT Treatment Interventions DME instruction;Gait training;Stair training;Functional mobility training;Therapeutic activities;Therapeutic exercise;Balance training    PT Goals (Current goals can be found in the Care Plan section)  Acute Rehab PT Goals Patient Stated Goal: return to daughter's house PT Goal Formulation: With patient Time For Goal Achievement: 04/29/16 Potential to Achieve Goals: Good    Frequency Min 3X/week   Barriers to discharge        Co-evaluation               End of Session   Activity Tolerance: Treatment limited secondary to medical complications (Comment) (increased HR) Patient left: in chair;with call bell/phone within reach Nurse Communication: Mobility status;Patient requests pain meds;Other (comment) (HR)    Functional Assessment Tool Used: clinical judgement and objective findings Functional Limitation: Mobility: Walking and moving around Mobility: Walking and Moving Around Current Status [phone]): At least 1 percent but less than 20 percent impaired, limited or restricted Mobility: Walking and Moving Around Goal Status [phone]): At least 1 percent but less than 20 percent impaired, limited or restricted    Time: 1401-1420 PT Time Calculation (min) (ACUTE ONLY): 19 min   Charges:   PT Evaluation $PT Eval Low Complexity: 1 Procedure     PT G Codes:   PT G-Codes **NOT FOR INPATIENT CLASS** Functional Assessment Tool Used: clinical judgement and objective  findings Functional Limitation: Mobility: Walking and moving around Mobility: Walking and Moving Around Current Status (H8299): At least 1 percent but less than 20 percent impaired, limited or restricted Mobility: Walking and Moving Around Goal Status [phone]): At least 1 percent but less than 20 percent impaired, limited or restricted    [facility] LUBECK 04/15/2016, 2:40 PM

## 2016-04-15 NOTE — Care Management Obs Status (Signed)
MEDICARE OBSERVATION STATUS NOTIFICATION   Patient Details  Name: Alexis Yates MRN: [id number] Date of Birth: 04/14/1927   Medicare Observation Status Notification Given:  Yes    Cherrie Distance, RN 04/15/2016, 3:26 PM

## 2016-04-15 NOTE — Progress Notes (Signed)
[facility]  PROGRESS NOTE  Alexis Yates WUJ:[id number] DOB: Apr 15, 1927 DOA: 04/12/2016   PCP: Pearla Dubonnet, MD  Brief HPI:   83 y.o. female with medical history significant of  CAD, GERD, HTN, CAD/MI, recurrent UTI, presenting with partially one week history of worsening shortness of breath and productive cough. Patient intermittently dyspneic with exertion prior to that. Symptoms are getting worse. Denies any chest pain, palpitations, abdominal pain, dysuria, frequency, back pain, fevers, nausea, vomiting. Patient does state feeling more tired and run down than usual over the last couple of days. Decreased oral intake.  Subjective   This morning patient feels better.Reports exertional dyspnea, denies orthopnea.    Assessment/Plan:   1. Hypotension- resolved, likely multifactorial from volume depletion and questionable infectious process. Chest x-ray is negative for pneumonia, mild elevation of lactic acid, WBC was 13.4, influenza PCR negative. UA abnormal.  2. UTI-patient had an abnormal UA, started on Levaquin. Urine culture results are still pending at this time. 3. Chronic pain syndrome-continue morphine 15 mg by mouth twice a day. PT eval and OT eval requested for consideration of SNF placement. This was discussed with daughter over the phone.  4. Depression/anxiety/restless leg syndrome-continue Mirapex, Effexor, Xanax, Requip 5. Coronary artery disease/history of MI- no recent chest pain, continue aspirin, Plavix 6. Chronic diastolic CHF- echocardiogram showed EF 60-65%, with grade 2 diastolic dysfunction. Patient is not on diuretics at home. Currently euvolemic, appreciate cardiology team following.  7. Acute kidney injury - from volume depletion, resolved with IVF, encouraged PO intake.    DVT prophylaxis: Heparin  Code Status: DNR  Family Communication: Discussed with daughter over the phone   Disposition Plan: Home vs SNF in AM.  Consultants:  Cardiology    PT/OT  Procedures:  None  Antibiotics:   Anti-infectives    Start     Dose/Rate Route Frequency Ordered Stop   04/14/16 1400  levofloxacin (LEVAQUIN) IVPB 500 mg  Status:  Discontinued     500 mg 100 mL/hr over 60 Minutes Intravenous Every 48 hours 04/12/16 1715 04/14/16 0841   04/14/16 1000  levofloxacin (LEVAQUIN) tablet 500 mg     500 mg Oral Every 48 hours 04/14/16 0841     04/12/16 1330  vancomycin (VANCOCIN) IVPB 1000 mg/200 mL premix     1,000 mg 200 mL/hr over 60 Minutes Intravenous  Once 04/12/16 1319 04/12/16 1522   04/12/16 1330  levofloxacin (LEVAQUIN) IVPB 750 mg     750 mg 100 mL/hr over 90 Minutes Intravenous  Once 04/12/16 1321 04/12/16 1554       Objective   Vitals:   04/14/16 0741 04/14/16 1324 04/14/16 1923 04/15/16 0510  BP: 129/71 112/68 120/63 117/70  Pulse: 85 92 99 72  Resp: 18 18 16 16   Temp: 97.6 F (36.4 C) 98.2 F (36.8 C) 97.9 F (36.6 C) 97.7 F (36.5 C)  TempSrc: Oral Oral Oral Oral  SpO2: 98% 98% 96% 96%  Weight:    49.4 kg (108 lb 12.8 oz)  Height:        Intake/Output Summary (Last 24 hours) at 04/15/16 1219 Last data filed at 04/15/16 0949  Gross per 24 hour  Intake              926 ml  Output             1000 ml  Net              -74 ml   Filed Weights   04/13/16  6861 04/14/16 0501 04/15/16 0510  Weight: 49.2 kg (108 lb 6.4 oz) 49.4 kg (108 lb 14.4 oz) 49.4 kg (108 lb 12.8 oz)     Physical Examination:  General exam: Appears calm and comfortable. Respiratory system: Respiratory effort normal. Cardiovascular system:  RRR. No  murmurs, rubs, gallops. No pedal edema. GI system: Abdomen is nondistended, soft and nontender. No organomegaly.  Central nervous system. No focal neurological deficits. 5 x 5 power in all extremities. Skin: No rashes, lesions or ulcers. Psychiatry: Alert, oriented x 3.Judgement and insight appear normal. Affect normal.  Data Reviewed: I have personally reviewed following labs and imaging  studies  CBG: No results for input(s): GLUCAP in the last 168 hours.  CBC:  Recent Labs Lab 04/12/16 1118 04/13/16 0414 04/14/16 0547 04/15/16 0447  WBC 13.4* 9.5 7.8 8.5  HGB 11.9* 9.4* 9.5* 9.8*  HCT 37.4 29.2* 29.6* 30.9*  MCV 93.0 92.4 92.8 92.2  PLT 378 293 315 352    Basic Metabolic Panel:  Recent Labs Lab 04/12/16 1118 04/13/16 0414 04/14/16 0547 04/15/16 0447  NA 138 143 139 141  K 3.9 4.2 4.2 4.4  CL 107 116* 113* 111  CO2 21* 22 23 24   GLUCOSE 135* 87 102* 115*  BUN 24* 12 8 11   CREATININE 1.37* 0.83 0.79 0.88  CALCIUM 9.6 8.9 9.0 9.2    Recent Results (from the past 240 hour(s))  Blood culture (routine x 2)     Status: None (Preliminary result)   Collection Time: 04/12/16 11:53 AM  Result Value Ref Range Status   Specimen Description BLOOD RIGHT FOREARM  Final   Special Requests BOTTLES DRAWN AEROBIC AND ANAEROBIC  5CC  Final   Culture NO GROWTH 2 DAYS  Final   Report Status PENDING  Incomplete  Blood culture (routine x 2)     Status: None (Preliminary result)   Collection Time: 04/12/16  2:11 PM  Result Value Ref Range Status   Specimen Description LEFT ANTECUBITAL  Final   Special Requests BOTTLES DRAWN AEROBIC AND ANAEROBIC 5CC  Final   Culture NO GROWTH 2 DAYS  Final   Report Status PENDING  Incomplete  Respiratory Panel by PCR     Status: None   Collection Time: 04/12/16  2:59 PM  Result Value Ref Range Status   Adenovirus NOT DETECTED NOT DETECTED Final   Coronavirus 229E NOT DETECTED NOT DETECTED Final   Coronavirus HKU1 NOT DETECTED NOT DETECTED Final   Coronavirus NL63 NOT DETECTED NOT DETECTED Final   Coronavirus OC43 NOT DETECTED NOT DETECTED Final   Metapneumovirus NOT DETECTED NOT DETECTED Final   Rhinovirus / Enterovirus NOT DETECTED NOT DETECTED Final   Influenza A NOT DETECTED NOT DETECTED Final   Influenza B NOT DETECTED NOT DETECTED Final   Parainfluenza Virus 1 NOT DETECTED NOT DETECTED Final   Parainfluenza Virus 2 NOT  DETECTED NOT DETECTED Final   Parainfluenza Virus 3 NOT DETECTED NOT DETECTED Final   Parainfluenza Virus 4 NOT DETECTED NOT DETECTED Final   Respiratory Syncytial Virus NOT DETECTED NOT DETECTED Final   Bordetella pertussis NOT DETECTED NOT DETECTED Final   Chlamydophila pneumoniae NOT DETECTED NOT DETECTED Final   Mycoplasma pneumoniae NOT DETECTED NOT DETECTED Final  Culture, Urine     Status: None   Collection Time: 04/12/16  6:09 PM  Result Value Ref Range Status   Specimen Description URINE, RANDOM  Final   Special Requests NONE  Final   Culture NO GROWTH  Final  Report Status 04/13/2016 FINAL  Final     Liver Function Tests:  Recent Labs Lab 04/13/16 0414  AST 22  ALT 19  ALKPHOS 63  BILITOT 0.6  PROT 5.3*  ALBUMIN 2.6*   Cardiac Enzymes:  Recent Labs Lab 04/12/16 1150  TROPONINI <0.03   BNP (last 3 results)  Recent Labs  04/12/16 1150  BNP 94.3   Studies: No results found.  Scheduled Meds: . aspirin EC  81 mg Oral Daily  . cholecalciferol  1,000 Units Oral q morning - 10a  . clopidogrel  75 mg Oral Daily  . dorzolamide  1 drop Both Eyes BID  . fluticasone furoate-vilanterol  1 puff Inhalation Daily  . heparin  5,000 Units Subcutaneous Q8H  . levofloxacin  500 mg Oral Q48H  . morphine  15 mg Oral Q12H  . OLANZapine-FLUoxetine  1 capsule Oral QPM  . pantoprazole  80 mg Oral Q1200  . pramipexole  0.25 mg Oral QHS  . prednisoLONE acetate  1 drop Right Eye BID  . sodium chloride flush  3 mL Intravenous Q12H    Time spent: 25 min  Debbora Presto, MD   [facility] Pager [phone]. If 7PM-7AM, please contact night-coverage at [email or web address],  Office  [phone] Password [email or web address] 04/15/2016, 12:19 PM  LOS: 0 days

## 2016-04-15 NOTE — Progress Notes (Signed)
Advance Beneficiary Notice (ABN) of Noncoverage given to the patient with detailed explanation; all questions answered. [name] [facility] [phone]

## 2016-04-15 NOTE — Progress Notes (Signed)
Patient slept well last night, no concerns or complaints.

## 2016-04-16 DIAGNOSIS — J189 Pneumonia, unspecified organism: Secondary | ICD-10-CM | POA: Diagnosis not present

## 2016-04-16 LAB — BASIC METABOLIC PANEL
Anion gap: 7 (ref 5–15)
BUN: 12 mg/dL (ref 6–20)
CHLORIDE: 108 mmol/L (ref 101–111)
CO2: 24 mmol/L (ref 22–32)
Calcium: 9.4 mg/dL (ref 8.9–10.3)
Creatinine, Ser: 0.97 mg/dL (ref 0.44–1.00)
GFR calc Af Amer: 60 mL/min — ABNORMAL LOW (ref >60)
GFR calc non Af Amer: 51 mL/min — ABNORMAL LOW (ref >60)
GLUCOSE: 113 mg/dL — AB (ref 65–99)
POTASSIUM: 4.2 mmol/L (ref 3.5–5.1)
Sodium: 139 mmol/L (ref 135–145)

## 2016-04-16 LAB — CBC
HCT: 32 % — ABNORMAL LOW (ref 36.0–46.0)
HEMOGLOBIN: 10.1 g/dL — AB (ref 12.0–15.0)
MCH: 29 pg (ref 26.0–34.0)
MCHC: 31.6 g/dL (ref 30.0–36.0)
MCV: 92 fL (ref 78.0–100.0)
Platelets: 356 10*3/uL (ref 150–400)
RBC: 3.48 MIL/uL — AB (ref 3.87–5.11)
RDW: 16.2 % — ABNORMAL HIGH (ref 11.5–15.5)
WBC: 8.6 10*3/uL (ref 4.0–10.5)

## 2016-04-16 LAB — PROCALCITONIN: Procalcitonin: <0.1 ng/mL

## 2016-04-16 MED ORDER — METOPROLOL TARTRATE 12.5 MG HALF TABLET: 12.5000 mg | ORAL_TABLET | Freq: Two times a day (BID) | ORAL | Status: DC

## 2016-04-16 MED ORDER — LEVOFLOXACIN 500 MG PO TABS: 500.0000 mg | ORAL_TABLET | ORAL | 0 refills | Status: DC

## 2016-04-16 MED ORDER — METOPROLOL TARTRATE 25 MG PO TABS
25.0000 mg | ORAL_TABLET | Freq: Once | ORAL | Status: AC
  Administered 2016-04-16: 25 mg via ORAL
  Filled 2016-04-16: qty 1

## 2016-04-16 MED ORDER — METOPROLOL TARTRATE 25 MG PO TABS: 12.5000 mg | ORAL_TABLET | Freq: Two times a day (BID) | ORAL | 0 refills | Status: DC

## 2016-04-16 NOTE — Discharge Instructions (Signed)
Hypotension As your heart beats, it forces blood through your body. This force is called blood pressure. If you have hypotension, you have low blood pressure. When your blood pressure is too low, you may not get enough blood to your brain. You may feel weak, feel light-headed, have a fast heartbeat, or even pass out (faint). Follow these instructions at home: Eating and drinking  Drink enough fluids to keep your pee (urine) clear or pale yellow.  Eat a healthy diet, and follow instructions from your doctor about eating or drinking restrictions. A healthy diet includes: ? Fresh fruits and vegetables. ? Whole grains. ? Low-fat (lean) meats. ? Low-fat dairy products.  Eat extra salt only as told. Do not add extra salt to your diet unless your doctor tells you to.  Eat small meals often.  Avoid standing up quickly after you eat. Medicines  Take over-the-counter and prescription medicines only as told by your doctor. ? Follow instructions from your doctor about changing how much you take (the dosage) of your medicines, if this applies. ? Do not stop or change your medicine on your own. General instructions  Wear compression stockings as told by your doctor.  Get up slowly from lying down or sitting.  Avoid hot showers and a lot of heat as told by your doctor.  Return to your normal activities as told by your doctor. Ask what activities are safe for you.  Do not use any products that contain nicotine or tobacco, such as cigarettes and e-cigarettes. If you need help quitting, ask your doctor.  Keep all follow-up visits as told by your doctor. This is important. Contact a doctor if:  You throw up (vomit).  You have watery poop (diarrhea).  You have a fever for more than 2-3 days.  You feel more thirsty than normal.  You feel weak and tired. Get help right away if:  You have chest pain.  You have a fast or irregular heartbeat.  You lose feeling (get numbness) in any part  of your body.  You cannot move your arms or your legs.  You have trouble talking.  You get sweaty or feel light-headed.  You faint.  You have trouble breathing.  You have trouble staying awake.  You feel confused. This information is not intended to replace advice given to you by your health care provider. Make sure you discuss any questions you have with your health care provider. Document Released: 05/26/2009 Document Revised: 11/18/2015 Document Reviewed: 11/18/2015 Elsevier Interactive Patient Education  2017 Elsevier Inc.   

## 2016-04-16 NOTE — Progress Notes (Signed)
At 1509 pt received all d/cinstructionsexplainedandgivento pt verbalized understanding. D/c off floor via w/c to awaiting transport. Amanda Pea, Charity fundraiser.

## 2016-04-16 NOTE — Progress Notes (Signed)
Patient talked to patient again with her family present about HHC choices; patient is agreeable today and wants HHC; home health care choice offered, pt/ daughter chose [facility] ( [facility]; [place] with [facility] called for arrangements; Alexis Goodell [phone]

## 2016-04-16 NOTE — Discharge Summary (Signed)
Physician Discharge Summary  Alexis Yates LEX:[id number] DOB: 12/25/1927 DOA: 04/12/2016  PCP: Pearla Dubonnet, MD  Admit date: 04/12/2016 Discharge date: 04/16/2016  Recommendations for Outpatient Follow-up:  1. Pt will need to follow up with PCP in 1-2 weeks post discharge 2. Please obtain BMP to evaluate electrolytes and kidney function 3. Please note that cardiology recommended stopping Lisinopril indefinitely  4. Please note that statin was removed from pt's list per her request as she no longer takes the medication  5. Complete therapy with Levaquin   Discharge Diagnoses:  Active Problems:   UTI (urinary tract infection)   Hypotension   HCAP (healthcare-associated pneumonia)   History of MI (myocardial infarction)   Depression with anxiety   Chronic pain syndrome  Discharge Condition: Stable  Diet recommendation: Heart healthy diet discussed in details   Brief HPI:   83 y.o.femalewith medical history significant of CAD, GERD, HTN, CAD/MI, recurrent UTI, presenting with partially one week history of worsening shortness of breath and productive cough. Patient intermittently dyspneic with exertion prior to that. Symptoms are getting worse. Denies any chest pain, palpitations, abdominal pain, dysuria, frequency, back pain, fevers, nausea, vomiting. Patient does state feeling more tired and run down than usual over the last couple of days. Decreased oral intake.  Subjective   This morning patient feels better. Wants to be discharged.    Assessment/Plan:   1. Hypotension- resolved, likely multifactorial from volume depletion and questionable infectious process. Chest x-ray is negative for pneumonia, mild elevation of lactic acid, WBC was 13.4, influenza PCR negative.  2. UTI-patient had an abnormal UA, started on Levaquin. Urine culture with no growth.  3. Chronic pain syndrome-continue home medical regimen  4. Depression/anxiety/restless leg syndrome-continue Mirapex,  Effexor, Xanax, Requip 5. Coronary artery disease/history of MI- no recent chest pain, continue aspirin, Plavix 6. Chronic diastolic CHF- echocardiogram showed EF 60-65%, with grade 2 diastolic dysfunction. Patient is not on diuretics at home. Currently euvolemic, appreciate cardiology team following. Cardiology recommends no diuretic at this time.  7. Acute kidney injury - from volume depletion, resolved with IVF  DVT prophylaxis: Heparin SQ while inpatient   Code Status: DNR  Family Communication: Discussed with daughter over the phone   Disposition Plan: Home   Consultants:  Cardiology   PT/OT  Procedures:  None  Procedures/Studies: Dg Chest 2 View  Result Date: 03/28/2016 CLINICAL DATA:  Acute onset of dysuria. Patient has not voided recently. Initial encounter. EXAM: CHEST  2 VIEW COMPARISON:  Chest radiograph performed 03/15/2016 FINDINGS: The lungs are well-aerated. Mild left basilar atelectasis is noted. There is no evidence of pleural effusion or pneumothorax. The heart is borderline enlarged. No acute osseous abnormalities are seen. Right convex thoracic scoliosis is noted. The patient's right shoulder arthroplasty is chronically superiorly subluxed. IMPRESSION: 1. Mild left basilar atelectasis noted.  Borderline cardiomegaly. 2. Right convex thoracic scoliosis noted. Electronically Signed   By: Roanna Raider M.D.   On: 03/28/2016 01:53   Dg Chest Portable 1 View  Result Date: 04/12/2016 CLINICAL DATA:  Shortness of breath over the last week. EXAM: PORTABLE CHEST 1 VIEW COMPARISON:  03/28/2016 FINDINGS: Chronic left ventricular prominence. Chronic aortic atherosclerosis. The lungs are clear. The vascularity is normal. Chronic scoliosis. Previous right shoulder replacement with subluxation. Chronic degenerative change of the left shoulder. IMPRESSION: No active disease identified.  Chronic findings as above. Electronically Signed   By: Paulina Fusi M.D.   On: 04/12/2016  11:52    Discharge Exam: Vitals:  04/15/16 1955 04/16/16 0513  BP: 106/64 (!) 158/76  Pulse: (!) 101 80  Resp: 18 18  Temp: 99.1 F (37.3 C) 97.8 F (36.6 C)   Vitals:   04/15/16 0510 04/15/16 1716 04/15/16 1955 04/16/16 0513  BP: 117/70 112/64 106/64 (!) 158/76  Pulse: 72 87 (!) 101 80  Resp: 16 18 18 18   Temp: 97.7 F (36.5 C) 98.3 F (36.8 C) 99.1 F (37.3 C) 97.8 F (36.6 C)  TempSrc: Oral Oral Oral Oral  SpO2: 96% 95% 96% 98%  Weight: 49.4 kg (108 lb 12.8 oz)   49 kg (108 lb)  Height:        General: Pt is alert, follows commands appropriately, not in acute distress Cardiovascular: S1/S2 +, SEM 4/6, no rubs, no gallops Respiratory: Clear to auscultation bilaterally, no wheezing, no crackles, no rhonchi Abdominal: Soft, non tender, non distended, bowel sounds +, no guarding  Discharge Instructions  Discharge Instructions    Diet - low sodium heart healthy    Complete by:  As directed    Increase activity slowly    Complete by:  As directed      Allergies as of 04/16/2016      Reactions   Penicillins Anaphylaxis   Has patient had a PCN reaction causing immediate rash, facial/tongue/throat swelling, SOB or lightheadedness with hypotension: Yes Has patient had a PCN reaction causing severe rash involving mucus membranes or skin necrosis: No Has patient had a PCN reaction that required hospitalization Yes Has patient had a PCN reaction occurring within the last 10 years: No If all of the above answers are "NO", then may proceed with Cephalosporin use.   Phenergan [promethazine Hcl] Other (See Comments)   Restless legs      Medication List    STOP taking these medications   acetaminophen 325 MG tablet Commonly known as:  TYLENOL   atorvastatin 40 MG tablet Commonly known as:  LIPITOR   feeding supplement (ENSURE ENLIVE) Liqd   lisinopril 10 MG tablet Commonly known as:  PRINIVIL,ZESTRIL   metoCLOPramide 10 MG tablet Commonly known as:  REGLAN    ondansetron 4 MG disintegrating tablet Commonly known as:  ZOFRAN ODT     TAKE these medications   ALPRAZolam 0.25 MG tablet Commonly known as:  XANAX Take 1 tablet (0.25 mg total) by mouth 2 (two) times daily as needed for anxiety.   aspirin 81 MG EC tablet Take 1 tablet (81 mg total) by mouth daily.   bimatoprost 0.01 % Soln Commonly known as:  LUMIGAN Place 1 drop into the left eye at bedtime.   BREO ELLIPTA 100-25 MCG/INH Aepb Generic drug:  fluticasone furoate-vilanterol Inhale 1 puff into the lungs daily.   calcium-vitamin D 250-100 MG-UNIT tablet Take 1 tablet by mouth 2 (two) times daily.   celecoxib 200 MG capsule Commonly known as:  CELEBREX Take 200 mg by mouth daily.   clopidogrel 75 MG tablet Commonly known as:  PLAVIX Take 1 tablet (75 mg total) by mouth daily.   docusate sodium 100 MG capsule Commonly known as:  COLACE Take 100 mg by mouth 2 (two) times daily.   dorzolamide 2 % ophthalmic solution Commonly known as:  TRUSOPT Place 1 drop into both eyes 2 (two) times daily.   esomeprazole 40 MG capsule Commonly known as:  NEXIUM Take 40 mg by mouth daily.   levofloxacin 500 MG tablet Commonly known as:  LEVAQUIN Take 1 tablet (500 mg total) by mouth every other day.  metoprolol tartrate 25 MG tablet Commonly known as:  LOPRESSOR Take 0.5 tablets (12.5 mg total) by mouth 2 (two) times daily.   OLANZapine-FLUoxetine 3-25 MG capsule Commonly known as:  SYMBYAX Take 1 capsule by mouth every evening.   oxyCODONE 5 MG immediate release tablet Commonly known as:  Oxy IR/ROXICODONE Take 1 tablet (5 mg total) by mouth every 6 (six) hours as needed for severe pain (pain). What changed:  when to take this  reasons to take this   oxymorphone 10 MG T12a 12 hr tablet Commonly known as:  OPANA ER Take 1 tablet (10 mg total) by mouth every 12 (twelve) hours.   pramipexole 0.25 MG tablet Commonly known as:  MIRAPEX Take 0.25 mg by mouth at  bedtime.   prednisoLONE acetate 1 % ophthalmic suspension Commonly known as:  PRED FORTE Place 1 drop into the right eye 2 (two) times daily.   rOPINIRole 0.25 MG tablet Commonly known as:  REQUIP Take 0.25 mg by mouth daily as needed (for restless legs).   senna 8.6 MG Tabs tablet Commonly known as:  SENOKOT Take 2 tablets (17.2 mg total) by mouth at bedtime as needed for mild constipation. What changed:  how much to take   SYSTANE ULTRA 0.4-0.3 % Soln Generic drug:  Polyethyl Glycol-Propyl Glycol Place 1 drop into both eyes 2 (two) times daily.   venlafaxine XR 75 MG 24 hr capsule Commonly known as:  EFFEXOR-XR Take 75 mg by mouth daily.   Vitamin D3 1000 units Caps Take 1,000 Units by mouth every morning.      Follow-up Information    Azalee Course, [place] Follow up on 04/23/2016.   Specialties:  Cardiology, Radiology Why:  at 2:30 for hospital follow up.  Contact information: [address] [place] [address] [phone]        Pearla Dubonnet, MD Follow up.   Specialty:  Internal Medicine Contact information: [address] [place] [address] [phone]            The results of significant diagnostics from this hospitalization (including imaging, microbiology, ancillary and laboratory) are listed below for reference.     Microbiology: Recent Results (from the past 240 hour(s))  Blood culture (routine x 2)     Status: None (Preliminary result)   Collection Time: 04/12/16 11:53 AM  Result Value Ref Range Status   Specimen Description BLOOD RIGHT FOREARM  Final   Special Requests BOTTLES DRAWN AEROBIC AND ANAEROBIC  5CC  Final   Culture NO GROWTH 3 DAYS  Final   Report Status PENDING  Incomplete  Blood culture (routine x 2)     Status: None (Preliminary result)   Collection Time: 04/12/16  2:11 PM  Result Value Ref Range Status   Specimen Description LEFT ANTECUBITAL  Final   Special Requests BOTTLES DRAWN AEROBIC  AND ANAEROBIC 5CC  Final   Culture NO GROWTH 3 DAYS  Final   Report Status PENDING  Incomplete  Respiratory Panel by PCR     Status: None   Collection Time: 04/12/16  2:59 PM  Result Value Ref Range Status   Adenovirus NOT DETECTED NOT DETECTED Final   Coronavirus 229E NOT DETECTED NOT DETECTED Final   Coronavirus HKU1 NOT DETECTED NOT DETECTED Final   Coronavirus NL63 NOT DETECTED NOT DETECTED Final   Coronavirus OC43 NOT DETECTED NOT DETECTED Final   Metapneumovirus NOT DETECTED NOT DETECTED Final   Rhinovirus / Enterovirus NOT DETECTED NOT DETECTED Final  Influenza A NOT DETECTED NOT DETECTED Final   Influenza B NOT DETECTED NOT DETECTED Final   Parainfluenza Virus 1 NOT DETECTED NOT DETECTED Final   Parainfluenza Virus 2 NOT DETECTED NOT DETECTED Final   Parainfluenza Virus 3 NOT DETECTED NOT DETECTED Final   Parainfluenza Virus 4 NOT DETECTED NOT DETECTED Final   Respiratory Syncytial Virus NOT DETECTED NOT DETECTED Final   Bordetella pertussis NOT DETECTED NOT DETECTED Final   Chlamydophila pneumoniae NOT DETECTED NOT DETECTED Final   Mycoplasma pneumoniae NOT DETECTED NOT DETECTED Final  Culture, Urine     Status: None   Collection Time: 04/12/16  6:09 PM  Result Value Ref Range Status   Specimen Description URINE, RANDOM  Final   Special Requests NONE  Final   Culture NO GROWTH  Final   Report Status 04/13/2016 FINAL  Final     Labs: Basic Metabolic Panel:  Recent Labs Lab 04/12/16 1118 04/13/16 0414 04/14/16 0547 04/15/16 0447 04/16/16 0502  NA 138 143 139 141 139  K 3.9 4.2 4.2 4.4 4.2  CL 107 116* 113* 111 108  CO2 21* 22 23 24 24   GLUCOSE 135* 87 102* 115* 113*  BUN 24* 12 8 11 12   CREATININE 1.37* 0.83 0.79 0.88 0.97  CALCIUM 9.6 8.9 9.0 9.2 9.4   Liver Function Tests:  Recent Labs Lab 04/13/16 0414  AST 22  ALT 19  ALKPHOS 63  BILITOT 0.6  PROT 5.3*  ALBUMIN 2.6*   No results for input(s): LIPASE, AMYLASE in the last 168 hours. No results  for input(s): AMMONIA in the last 168 hours. CBC:  Recent Labs Lab 04/12/16 1118 04/13/16 0414 04/14/16 0547 04/15/16 0447 04/16/16 0502  WBC 13.4* 9.5 7.8 8.5 8.6  HGB 11.9* 9.4* 9.5* 9.8* 10.1*  HCT 37.4 29.2* 29.6* 30.9* 32.0*  MCV 93.0 92.4 92.8 92.2 92.0  PLT 378 293 315 352 356   Cardiac Enzymes:  Recent Labs Lab 04/12/16 1150  TROPONINI <0.03   BNP: BNP (last 3 results)  Recent Labs  04/12/16 1150  BNP 94.3    SIGNED: Time coordinating discharge: 30 minutes  Debbora Presto, MD  [facility] 04/16/2016, 10:07 AM Pager [phone]  If 7PM-7AM, please contact night-coverage [email or web address] Password [email or web address]

## 2016-04-16 NOTE — Clinical Social Work Note (Signed)
CSW acknowledges SNF consult. PT recommending HHPT. CSW provided patient and her daughter with a list of ALF's in [place] and [place] counties. Encouraged them to apply for Medicaid as this pays for long-term care.  CSW signing off. Consult again if any social work needs arise.  Charlynn Court, CSW [phone]

## 2016-04-17 LAB — CULTURE, BLOOD (ROUTINE X 2)
Culture: NO GROWTH
Culture: NO GROWTH

## 2016-04-23 ENCOUNTER — Ambulatory Visit (INDEPENDENT_AMBULATORY_CARE_PROVIDER_SITE_OTHER): Payer: Medicare Other | Admitting: Physician Assistant

## 2016-04-23 ENCOUNTER — Encounter: Payer: Self-pay | Admitting: Physician Assistant

## 2016-04-23 VITALS — BP 120/80 | HR 88 | Ht 60.0 in | Wt 116.0 lb

## 2016-04-23 DIAGNOSIS — I2510 Atherosclerotic heart disease of native coronary artery without angina pectoris: Secondary | ICD-10-CM

## 2016-04-23 DIAGNOSIS — I10 Essential (primary) hypertension: Secondary | ICD-10-CM

## 2016-04-23 DIAGNOSIS — I5189 Other ill-defined heart diseases: Secondary | ICD-10-CM

## 2016-04-23 NOTE — Patient Instructions (Signed)
Medication Instructions:  Your physician recommends that you continue on your current medications as directed. Please refer to the Current Medication list given to you today.  If you need a refill on your cardiac medications before your next appointment, please call your pharmacy.  Labwork: NONE  Follow-Up: Your physician recommends that you schedule a follow-up appointment in: 2 MONTHS WITH DR [facility]    Thank you for choosing [facility] at [facility]!!    HAO MENG, PA-C [place], LPN

## 2016-04-23 NOTE — Progress Notes (Signed)
Cardiology Office Note    Date:  04/24/2016   ID:  Alexis Yates, DOB [birth year], MRN [id number]  PCP:  Pearla Dubonnet, MD  Cardiologist:  Dr. Herbie Baltimore  Chief Complaint  Patient presents with  . Hospitalization Follow-up    seen for Dr. Herbie Baltimore    History of Present Illness:  Alexis Yates is a 83 y.o. female  with PMH of HTN, GERD, severe bladder prolapse managed with pessary was recently admitted for NSTEMI. Prior to recent admission, she has no known history of CAD. She presented with burning sensation in her chest and also weakness. Initial EKG showed possible inferior Q waves with T-wave inversion. EKG was 11.76. It was felt that her ACS occurred likely more than 24 hours prior to the presentation which is supported by the EKG which suggest possible out of the hospital inferior infarct. She was taken urgently to the cath lab on the day of arrival, this showed 100% mid RCA occlusion treated with Promus per meter 2.25 x 28 mm DES, ejection fraction 50-55% by visual estimate, apical inferior hypokinesis to akinesis, 40% ostial to proximal LAD lesion, 70% OM1 lesion, 90% ostial D2 lesion which was very small vessel. Post procedure, she was placed on aspirin, Plavix and statin. She was also noted to have loud murmur post cath. Echocardiogram obtained on 01/11/2016 showed EF 60-65%, obstruction at rest in the outflow tract with peak gradient well 8 mmHg, which increased to 138 mmHg with Valsalva, grade 2 diastolic dysfunction, PA peak pressure 35 mmHg. She has been instructed to avoid dehydration and avoid nitroglycerin. Her lisinopril was discontinued due to concern of making obstruction worse with afterload reduction. She was eventually discharged to rehabilitation facility 10/31.  I saw the patient in the office on 01/23/2016, at which time she denies any chest discomfort. Her daughter teaches descriptive chemistry and urine CG. Unfortunately since then, she has had been admitted to the  hospital at least 4 times. She was admitted to the hospital from 12/17-of/21 and was treated for UTI with possible diverticulitis. She was readmitted on 03/06/2016 for the same thing and underwent another course of antibiotic. Since then, she has been seen in the emergency room on 03/15/2016 and also 03/28/2016 with a recurrent UTI. I saw the patient again in the hospital as a consult on 04/14/2016, she was having progressive weakness and shortness of breath with exertion. She denied any obvious chest pain. Due to persistent weakness and shortness of breath, she eventually sought medical attention at [facility]. Lactic acid was mildly elevated at 2.3. She was also hypotensive on arrival. Her was some thought about possible diastolic dysfunction heart failure, however she did not appears to be heart failure. Her hypotension was felt to be related to dehydration. We did not recommend diuretic at the time due to her significant LVOT gradient with diastolic dysfunction. She is very sensitive to dehydration. Her blood pressure was held. He has completed a course of Levaquin.  Since discharge, she was living with her daughter for one day, since then, she has moved out on her own with a helper come to her house on daily basis to help with cleaning and cooking. She is fairly independent at this time. She is no longer feeling fatigued. She has been watching her fluid intake and to try to keep her hydrated.   Past Medical History:  Diagnosis Date  . Arthritis   . Atherosclerosis of aorta (HCC)   . CAD (coronary artery disease)  a. 10/27 PCI with DES to RCA, diffuse nonobstructive disease, EF 50-55%  . Chronic pain   . Diverticulosis   . Elevated transaminase level   . GERD (gastroesophageal reflux disease)   . Glaucoma   . Hiatal hernia   . Hypertension   . Lung nodules    Right  . MI (myocardial infarction) 01/09/2016  . Scoliosis   . UTI (urinary tract infection)    refractory    Past  Surgical History:  Procedure Laterality Date  . ABDOMINAL HYSTERECTOMY    . CARDIAC CATHETERIZATION N/A 01/09/2016   Procedure: Left Heart Cath and Coronary Angiography;  Surgeon: Marykay Lex, MD;  Location: [facility] INVASIVE CV LAB;  Service: Cardiovascular;  Laterality: N/A;  . CARDIAC CATHETERIZATION N/A 01/09/2016   Procedure: Coronary Stent Intervention;  Surgeon: Marykay Lex, MD;  Location: [facility] INVASIVE CV LAB;  Service: Cardiovascular;  Laterality: N/A;  . EYE SURGERY    . FRACTURE SURGERY     Tibia/fibula of right leg    Current Medications: Outpatient Medications Prior to Visit  Medication Sig Dispense Refill  . ALPRAZolam (XANAX) 0.25 MG tablet Take 1 tablet (0.25 mg total) by mouth 2 (two) times daily as needed for anxiety. 10 tablet 0  . aspirin EC 81 MG EC tablet Take 1 tablet (81 mg total) by mouth daily. 30 tablet 3  . bimatoprost (LUMIGAN) 0.01 % SOLN Place 1 drop into the left eye at bedtime.     . calcium-vitamin D 250-100 MG-UNIT per tablet Take 1 tablet by mouth 2 (two) times daily.    . celecoxib (CELEBREX) 200 MG capsule Take 200 mg by mouth daily.     . Cholecalciferol (VITAMIN D3) 1000 units CAPS Take 1,000 Units by mouth every morning.    . clopidogrel (PLAVIX) 75 MG tablet Take 1 tablet (75 mg total) by mouth daily. 30 tablet 11  . docusate sodium (COLACE) 100 MG capsule Take 100 mg by mouth 2 (two) times daily.     . dorzolamide (TRUSOPT) 2 % ophthalmic solution Place 1 drop into both eyes 2 (two) times daily.    Marland Kitchen esomeprazole (NEXIUM) 40 MG capsule Take 40 mg by mouth daily.  3  . fluticasone furoate-vilanterol (BREO ELLIPTA) 100-25 MCG/INH AEPB Inhale 1 puff into the lungs daily.    Marland Kitchen levofloxacin (LEVAQUIN) 500 MG tablet Take 1 tablet (500 mg total) by mouth every other day. 3 tablet 0  . metoprolol tartrate (LOPRESSOR) 25 MG tablet Take 0.5 tablets (12.5 mg total) by mouth 2 (two) times daily. 60 tablet 0  . OLANZapine-FLUoxetine (SYMBYAX) 3-25 MG capsule  Take 1 capsule by mouth every evening.    Marland Kitchen oxyCODONE (OXY IR/ROXICODONE) 5 MG immediate release tablet Take 1 tablet (5 mg total) by mouth every 6 (six) hours as needed for severe pain (pain). (Patient taking differently: Take 5 mg by mouth every 8 (eight) hours as needed for severe pain (for breakthrough pain). ) 10 tablet 0  . oxymorphone 10 MG PO T12A 12 hr tablet Take 1 tablet (10 mg total) by mouth every 12 (twelve) hours. 10 tablet 0  . Polyethyl Glycol-Propyl Glycol (SYSTANE ULTRA) 0.4-0.3 % SOLN Place 1 drop into both eyes 2 (two) times daily.    . pramipexole (MIRAPEX) 0.25 MG tablet Take 0.25 mg by mouth at bedtime.  3  . prednisoLONE acetate (PRED FORTE) 1 % ophthalmic suspension Place 1 drop into the right eye 2 (two) times daily.   1  . rOPINIRole (  REQUIP) 0.25 MG tablet Take 0.25 mg by mouth daily as needed (for restless legs).    . senna (SENOKOT) 8.6 MG TABS tablet Take 2 tablets (17.2 mg total) by mouth at bedtime as needed for mild constipation. (Patient taking differently: Take by mouth at bedtime as needed for mild constipation. ) 60 each 0  . venlafaxine XR (EFFEXOR-XR) 75 MG 24 hr capsule Take 75 mg by mouth daily.  1   No facility-administered medications prior to visit.      Allergies:   Penicillins and Phenergan [promethazine hcl]   Social History   Social History  . Marital status: Widowed    Spouse name: N/A  . Number of children: 2  . Years of education: N/A   Occupational History  . Retired from school system    Social History Main Topics  . Smoking status: Former Games developer  . Smokeless tobacco: Never Used  . Alcohol use No  . Drug use: No  . Sexual activity: Not Asked   Other Topics Concern  . None   Social History Narrative   Widowed.  Lives alone.  Ambulates with a cane.     Family History:  The patient's family history includes Cancer in her brother; Diabetes in her mother; Heart disease in her father; Heart failure in her mother.   ROS:     Please see the history of present illness.    ROS All other systems reviewed and are negative.   PHYSICAL EXAM:   VS:  BP 120/80   Pulse 88   Ht 5' (1.524 m)   Wt 116 lb (52.6 kg)   BMI 22.65 kg/m    GEN: Well nourished, well developed, in no acute distress  HEENT: normal  Neck: no JVD, carotid bruits, or masses Cardiac: RRR; no rubs, or gallops,no edema  3/6 systolic murmur Respiratory:  clear to auscultation bilaterally, normal work of breathing GI: soft, nontender, nondistended, + BS MS: no deformity or atrophy  Skin: warm and dry, no rash Neuro:  Alert and Oriented x 3, Strength and sensation are intact Psych: euthymic mood, full affect  Wt Readings from Last 3 Encounters:  04/23/16 116 lb (52.6 kg)  04/16/16 108 lb (49 kg)  03/27/16 110 lb (49.9 kg)      Studies/Labs Reviewed:   EKG:  EKG is not rdered today.    Recent Labs: 01/09/2016: TSH 1.467 04/12/2016: B Natriuretic Peptide 94.3 04/13/2016: ALT 19 04/16/2016: BUN 12; Creatinine, Ser 0.97; Hemoglobin 10.1; Platelets 356; Potassium 4.2; Sodium 139   Lipid Panel    Component Value Date/Time   CHOL 148 01/09/2016 0523   TRIG 21 01/09/2016 0523   HDL 75 01/09/2016 0523   CHOLHDL 2.0 01/09/2016 0523   VLDL 4 01/09/2016 0523   LDLCALC 69 01/09/2016 0523    Additional studies/ records that were reviewed today include:   Cath 01/09/2016 Conclusion     Mid RCA lesion, 100 %stenosed.  A STENT PROMUS PREM MR 2.25X28 drug eluting stent was successfully placed.  Post intervention, there is a 0% residual stenosis.  The left ventricular ejection fraction is 50-55% by visual estimate. Apical Inferior Hypo to Akinesis.  Residual CAD  Ost LAD to Prox LAD lesion, 40 %stenosed. Mid LAD lesion, 30 %stenosed.  1st Mrg lesion, 70 %stenosed. Very small vessel. 2nd Mrg lesion, 30 %stenosed.  Ost 2nd Diag to 2nd Diag lesion, 90 %stenosed. Very small vessel.  I suspect the patient was having stuttering  symptoms from her RCA disease  and now is occluded. Very difficult PCI procedure. There was extensive clot in the artery and I had to do several balloon inflations to achieve TIMI 2-3 flow distally. Initial thoughts were to do PTCA only basilar sized vessel, however there was a hazy segment at the original occlusion site that I felt was best covered with a stent.  The lesion was successfully treated with a DES stent. He was loaded with Plavix (initial thoughts were that she may be a twilight candidate, however this appears to be not the case) -- we will need to change orders back to continue Plavix tomorrow morning and not load Brilinta.   Plan:  Turn to CCU for ongoing care. Sheath removal per protocol. Would continue aspirin plus Plavix for 1 year. Cardiac risk factor modification - beta blocker, statin     Echo 01/11/2016 LV EF: 60% - 65%  - Left ventricle: The cavity size was normal. There was moderate focal basal hypertrophy. Systolic function was normal. The estimated ejection fraction was in the range of 60% to 65%. There was dynamic obstruction at restin the outflow tract, with a peak gradient of 108 mm Hg. There was dynamic obstruction during Valsalvain the outflow tract, with a peak gradient of 138 mm Hg. Wall motion was normal; there were no regional wall motion abnormalities. Features are consistent with a pseudonormal left ventricular filling pattern, with concomitant abnormal relaxation and increased filling pressure (grade 2 diastolic dysfunction). Doppler parameters are consistent with high ventricular filling pressure. - Aortic valve: Trileaflet; mildly thickened, mildly calcified leaflets. There was trivial regurgitation. - Mitral valve: Moderately calcified annulus. Mild focal calcification of the anterior leaflet (lateral segment(s)) and posterior leaflet (lateral scallop(s)). There was severe systolic anterior motion of the  anterior leaflet. There was mild regurgitation. - Left atrium: The atrium was mildly dilated. - Atrial septum: There was increased thickness of the septum, consistent with lipomatous hypertrophy. - Pulmonary arteries: PA peak pressure: 35 mm Hg (S).  Impressions:  - The right ventricular systolic pressure was increased consistent with mild pulmonary hypertension.      ASSESSMENT:    1. Coronary artery disease involving native coronary artery of native heart without angina pectoris   2. Essential hypertension   3. Dynamic left ventricular outflow obstruction      PLAN:  In order of problems listed above:  1. CAD: Continue aspirin and Plavix. No recent angina.  2. Hypertension: Blood pressure stable, lisinopril has been discontinued during recent hospitalization, she is currently on low-dose metoprolol  3. LVOT obstruction: Likely contributed to her hypotension in the setting of UTI recently. She is more prone to dehydration. We have actually recommended increased salt intake and fluid intake as well. Lisinopril has been discontinued, he was started on low-dose beta blocker to increase filling.    Medication Adjustments/Labs and Tests Ordered: Current medicines are reviewed at length with the patient today.  Concerns regarding medicines are outlined above.  Medication changes, Labs and Tests ordered today are listed in the Patient Instructions below. Patient Instructions  Medication Instructions:  Your physician recommends that you continue on your current medications as directed. Please refer to the Current Medication list given to you today.  If you need a refill on your cardiac medications before your next appointment, please call your pharmacy.  Labwork: NONE  Follow-Up: Your physician recommends that you schedule a follow-up appointment in: 2 MONTHS WITH DR [facility]    Thank you for choosing [facility] at [facility]!!    Novah Nessel  Samil Mecham, PA-C [place],  LPN     Signed, Azalee Course, [place]  04/24/2016 1:56 AM    [facility] [address] [place], [place], [place]  [address] Phone: [phone]; Fax: [phone]

## 2016-04-24 ENCOUNTER — Encounter: Payer: Self-pay | Admitting: Physician Assistant

## 2016-04-24 DIAGNOSIS — I5189 Other ill-defined heart diseases: Secondary | ICD-10-CM | POA: Insufficient documentation

## 2016-04-24 DIAGNOSIS — I25119 Atherosclerotic heart disease of native coronary artery with unspecified angina pectoris: Secondary | ICD-10-CM | POA: Insufficient documentation

## 2016-04-26 ENCOUNTER — Telehealth: Payer: Self-pay | Admitting: Cardiology

## 2016-04-26 NOTE — Telephone Encounter (Signed)
Returned call to patient She states atorvastatin is causing her diarrhea She was on abx - completed over 1 week ago She is consuming same diet as previous  Will defer to MD & clinical pharmacy staff for advice Patient is currently holding statin

## 2016-04-26 NOTE — Telephone Encounter (Signed)
Antibiotics can cause diarrhea for many patient, so this could have made things worse.  She can continue to hold atorvastatin for about 2 weeks, then re-challenge with same dose to see if diarrhea returns.  Or she can switch to rosuvastatin 20 mg daily, this has less chance of diarrhea as a side effect.

## 2016-04-26 NOTE — Telephone Encounter (Signed)
New message   Pt c/o medication issue:  1. Name of Medication: atorvastatin (LIPITOR) 40 MG tablet  2. How are you currently taking this medication (dosage and times per day)? 1 tab once daily  3. Are you having a reaction (difficulty breathing--STAT)? No   4. What is your medication issue? Pt calling stating that the medication is giving her diarrhea, and she wants to know if she can stop it. She did not take it last night.

## 2016-04-26 NOTE — Telephone Encounter (Signed)
Returned call to patient with Belenda Cruise, [facility] advice. Patient voiced understanding. Advised to call back if diarrhea returns

## 2016-04-27 NOTE — Telephone Encounter (Signed)
I agree with Belenda Cruise

## 2016-04-28 ENCOUNTER — Telehealth: Payer: Self-pay | Admitting: Cardiology

## 2016-04-28 NOTE — Telephone Encounter (Signed)
Pt in dental chair to have cleaning and fillings done. Needed OK to proceed per caller. Per cardiology protocol, no special instruction advised. OK given to proceed w fillings, no interruption to current meds. Caller voiced understanding.

## 2016-04-28 NOTE — Telephone Encounter (Signed)
[facility] office is calling on behalf of patient. Patient needs dental treatment (fillings) and is currently in their office. They would like to know if it is okay to proceed with dental treatment, states patient had a heart attack in October and has stents in. Thanks.

## 2016-06-24 ENCOUNTER — Encounter: Payer: Self-pay | Admitting: Cardiology

## 2016-06-24 ENCOUNTER — Ambulatory Visit (INDEPENDENT_AMBULATORY_CARE_PROVIDER_SITE_OTHER): Payer: Medicare Other | Admitting: Cardiology

## 2016-06-24 VITALS — BP 124/80 | HR 79 | Ht 60.0 in | Wt 124.2 lb

## 2016-06-24 DIAGNOSIS — I25119 Atherosclerotic heart disease of native coronary artery with unspecified angina pectoris: Secondary | ICD-10-CM

## 2016-06-24 DIAGNOSIS — I2119 ST elevation (STEMI) myocardial infarction involving other coronary artery of inferior wall: Secondary | ICD-10-CM

## 2016-06-24 DIAGNOSIS — R5383 Other fatigue: Secondary | ICD-10-CM

## 2016-06-24 DIAGNOSIS — I10 Essential (primary) hypertension: Secondary | ICD-10-CM | POA: Diagnosis not present

## 2016-06-24 DIAGNOSIS — R0609 Other forms of dyspnea: Secondary | ICD-10-CM | POA: Insufficient documentation

## 2016-06-24 DIAGNOSIS — I5189 Other ill-defined heart diseases: Secondary | ICD-10-CM

## 2016-06-24 NOTE — Patient Instructions (Addendum)
SCHEDULE [address] [phone] Your physician has requested that you have an echocardiogram. Echocardiography is a painless test that uses sound waves to create images of your heart. It provides your doctor with information about the size and shape of your heart and how well your heart's chambers and valves are working. This procedure takes approximately one hour. There are no restrictions for this procedure.   LABS -- CBC,TSH-- [facility] --AT THE [address]OVE [address]ESS [address]  - FIRST FLOOR   Your physician recommends that you schedule a follow-up appointment in 1 MONTH WITH DR HARDING.

## 2016-06-24 NOTE — Progress Notes (Signed)
PCP: Pearla Dubonnet, MD  Clinic Note: Chief Complaint  Patient presents with  . Follow-up    pt states having a little chest pain but not much   . Shortness of Breath    states when doing anything   . Edema    left ankle    HPI: DEMARIE UHLIG is a 83 y.o. female with a PMH below who presents today for 2 month follow-up for CAD-non-STEMI back in October 2017. She was found to have an occluded mid RCA treated with a DES stent. She had 70% OM1 90% D2 lesions treated medically. Normal preserved EF but with apical inferior hypokinesis. EF by echo was 60 and 65%.  ELSY CHIANG was last seen on February 18 by Azalee Course, PA she was doing relatively well. Living at home with a home health provider helping.  Recent Hospitalizations: Multiple since October 2017  December 17-21, 2017: Admitted for diverticulitis along with UTI and dehydration.  December 23-26, 2017: Recurrent abdominal pain following hospitalization. Antibiotics converted for better coverage of diverticulitis. Her treatment for, UTI was also continued.  ER visit 03/15/2016: Discharge to rehabilitation from recent hospitalization now presented with confusion and weakness thought to be related dehydration. Patient was discharged back home. Apparently she was given an extra dose of clonidine while in the nursing home.  ER presentation again 03/27/2016: No difficulty urinating with dysuria confusion and disorientation likely recurrent UTI. Was actually found to be impacted which was clear to bedside and the patient had significant improvement was discharged home.  Readmitted 04/12/2016, yet again with recurrent UTI and hospital-acquired pneumonia. She had some worsening dyspnea and productive cough. Intermittent exertional dyspnea that time. She was significantly hypotensive. Symptoms improved with hydration.  I did see her in consult in hospital Jan -- recommendation was to hold antihypertensives and not restart ACE  inhibitor. Recommended starting beta blocker when able to to avoid rebound. Would not put the importance of adequate hydration and eating. Indicated that with LVOT gradient issue she is very susceptible to dehydration.  Studies Reviewed:   Cath 01/09/16:  Mid RCA lesion, 100 %stenosed.  A STENT PROMUS PREM MR 2.25X28 drug eluting stent was successfully placed.  Post intervention, there is a 0% residual stenosis.   The left ventricular ejection fraction is 50-55% by visual estimate. Apical Inferior Hypo to Akinesis.  Residual CAD  Ost LAD to Prox LAD lesion, 40 %stenosed. Mid LAD lesion, 30 %stenosed.  1st Mrg lesion, 70 %stenosed. Very small vessel. 2nd Mrg lesion, 30 %stenosed.  Ost 2nd Diag to 2nd Diag lesion, 90 %stenosed. Very small vessel.  Diagnostic Diagram       Post-Intervention Diagram          mRCA PCI: PROMUS PREM MR 1.61W96 drug eluting stent           2D Echo 01/11/16: Mod focal basal LVH. EF 60-65% w/ dynamic LVOT obstruction (Peak Gradient 138 mmHg). No RWMA. Gr 2 DD - high filling pressures. Severe SAM. ~ PAP 35 mmHg.  Interval History: Tamora presents today first time I have seen her other than in the hospital since her discharge. She is quite deconditioned now still most short of breath doing just that anything. She is also having some coughing. Quite weak. She still has some wheezing and just generalized fatigue. Interestingly though, she was able to walk around [employer] pushing the cart for about 30-40 minutes and did fine while walking, just simply when she returned home she was exhausted. She  does indicate that it is very difficult for her to walk due to severe osteoarthritis pains and this takes a lot of effort. She is doing the best she can to try to eat and stay hydrated, but she continues to be fatigued and dizzy.   She denies any chest tightness or pressure rest or exertion. She really has not noticed too much exertion and is limited somewhat by functional  ability as well as dyspnea. No sensation of rapid heartbeats or palpitations. Although she is dizzy, no syncope,near-syncope, TIA or amaurosis fugax.  ROS: A comprehensive was performed. Review of Systems  Constitutional: Positive for malaise/fatigue.  HENT: Negative for nosebleeds.   Respiratory: Positive for cough, shortness of breath and wheezing (Occasional).   Gastrointestinal: Negative for blood in stool, constipation and heartburn.  Genitourinary: Positive for dysuria (Intermittent dysuria likely related to recurrent UTIs.). Negative for hematuria.  Musculoskeletal: Positive for back pain and joint pain. Negative for falls and myalgias.  Neurological: Positive for dizziness and weakness (Globalized).  Psychiatric/Behavioral: Positive for memory loss. The patient is not nervous/anxious and does not have insomnia.   All other systems reviewed and are negative.   Past Medical History:  Diagnosis Date  . Arthritis   . Atherosclerosis of aorta (HCC)   . CAD (coronary artery disease)    a. 10/27 PCI with DES to RCA, diffuse nonobstructive disease, EF 50-55%  . Chronic pain   . Diverticulosis   . Dynamic left ventricular outflow obstruction    Mod focal basal LVH. EF 60-65% w/ dynamic LVOT obstruction (Peak Gradient 138 mmHg). No RWMA. Gr 2 DD - high filling pressures. Severe SAM. ~ PAP 35 mmHg.  Marland Kitchen Elevated transaminase level   . GERD (gastroesophageal reflux disease)   . Glaucoma   . Hiatal hernia   . Hypertension   . Lung nodules    Right  . MI (myocardial infarction) (HCC) 01/09/2016  . Scoliosis   . UTI (urinary tract infection)    refractory    Past Surgical History:  Procedure Laterality Date  . ABDOMINAL HYSTERECTOMY    . CARDIAC CATHETERIZATION N/A 01/09/2016   Procedure: Left Heart Cath and Coronary Angiography;  Surgeon: Marykay Lex, MD;  Location: [facility] INVASIVE CV LAB: mRCA 100% thrombotic lesion (PCI). o-pLAD 40%, mLAD 30%, oD1 90%. OM1 (small) 70%.  .  CARDIAC CATHETERIZATION N/A 01/09/2016   Procedure: Coronary Stent Intervention;  Surgeon: Marykay Lex, MD;  Location: [facility] INVASIVE CV LAB: mRCA PCI - STENT PROMUS PREM MR 2.25X28 drug eluting stent  . EYE SURGERY    . FRACTURE SURGERY     Tibia/fibula of right leg  . TRANSTHORACIC ECHOCARDIOGRAM  01/11/2016   Mod focal basal LVH. EF 60-65% w/ dynamic LVOT obstruction (Peak Gradient 138 mmHg). No RWMA. Gr 2 DD - high filling pressures. Severe SAM. ~ PAP 35 mmHg.    Current Meds  Medication Sig  . ALPRAZolam (XANAX) 0.25 MG tablet Take 1 tablet (0.25 mg total) by mouth 2 (two) times daily as needed for anxiety.  Marland Kitchen aspirin EC 81 MG EC tablet Take 1 tablet (81 mg total) by mouth daily.  Marland Kitchen atorvastatin (LIPITOR) 40 MG tablet Take 1 tablet by mouth daily.  . bimatoprost (LUMIGAN) 0.01 % SOLN Place 1 drop into the left eye at bedtime.   . calcium-vitamin D 250-100 MG-UNIT per tablet Take 1 tablet by mouth 2 (two) times daily.  . celecoxib (CELEBREX) 200 MG capsule Take 200 mg by mouth daily.   Marland Kitchen  Cholecalciferol (VITAMIN D3) 1000 units CAPS Take 1,000 Units by mouth every morning.  . clopidogrel (PLAVIX) 75 MG tablet Take 1 tablet (75 mg total) by mouth daily.  Marland Kitchen docusate sodium (COLACE) 100 MG capsule Take 100 mg by mouth 2 (two) times daily.   . dorzolamide (TRUSOPT) 2 % ophthalmic solution Place 1 drop into both eyes 2 (two) times daily.  Marland Kitchen esomeprazole (NEXIUM) 40 MG capsule Take 40 mg by mouth daily.  . fluticasone furoate-vilanterol (BREO ELLIPTA) 100-25 MCG/INH AEPB Inhale 1 puff into the lungs daily.  Marland Kitchen levofloxacin (LEVAQUIN) 500 MG tablet Take 1 tablet (500 mg total) by mouth every other day.  . metoprolol tartrate (LOPRESSOR) 25 MG tablet Take 0.5 tablets (12.5 mg total) by mouth 2 (two) times daily.  Marland Kitchen OLANZapine-FLUoxetine (SYMBYAX) 3-25 MG capsule Take 1 capsule by mouth every evening.  Marland Kitchen oxyCODONE (OXY IR/ROXICODONE) 5 MG immediate release tablet Take 1 tablet (5 mg total) by  mouth every 6 (six) hours as needed for severe pain (pain). (Patient taking differently: Take 5 mg by mouth every 8 (eight) hours as needed for severe pain (for breakthrough pain). )  . oxymorphone 10 MG PO T12A 12 hr tablet Take 1 tablet (10 mg total) by mouth every 12 (twelve) hours.  Bertram Gala Glycol-Propyl Glycol (SYSTANE ULTRA) 0.4-0.3 % SOLN Place 1 drop into both eyes 2 (two) times daily.  . pramipexole (MIRAPEX) 0.25 MG tablet Take 0.25 mg by mouth at bedtime.  . prednisoLONE acetate (PRED FORTE) 1 % ophthalmic suspension Place 1 drop into the right eye 2 (two) times daily.   Marland Kitchen rOPINIRole (REQUIP) 0.25 MG tablet Take 0.25 mg by mouth daily as needed (for restless legs).  . senna (SENOKOT) 8.6 MG TABS tablet Take 2 tablets (17.2 mg total) by mouth at bedtime as needed for mild constipation. (Patient taking differently: Take by mouth at bedtime as needed for mild constipation. )  . venlafaxine XR (EFFEXOR-XR) 75 MG 24 hr capsule Take 75 mg by mouth daily.    Allergies  Allergen Reactions  . Penicillins Anaphylaxis    Has patient had a PCN reaction causing immediate rash, facial/tongue/throat swelling, SOB or lightheadedness with hypotension: Yes Has patient had a PCN reaction causing severe rash involving mucus membranes or skin necrosis: No Has patient had a PCN reaction that required hospitalization Yes Has patient had a PCN reaction occurring within the last 10 years: No If all of the above answers are "NO", then may proceed with Cephalosporin use.   Marland Kitchen Phenergan [Promethazine Hcl] Other (See Comments)    Restless legs    Social History   Social History  . Marital status: Widowed    Spouse name: N/A  . Number of children: 2  . Years of education: N/A   Occupational History  . Retired from school system    Social History Main Topics  . Smoking status: Former Games developer  . Smokeless tobacco: Never Used  . Alcohol use No  . Drug use: No  . Sexual activity: Not Asked    Other Topics Concern  . None   Social History Narrative   Widowed.  Lives alone.  Ambulates with a cane.    family history includes Cancer in her brother; Diabetes in her mother; Heart disease in her father; Heart failure in her mother.  Wt Readings from Last 3 Encounters:  06/24/16 124 lb 3.2 oz (56.3 kg)  04/23/16 116 lb (52.6 kg)  04/16/16 108 lb (49 kg)    PHYSICAL  EXAM BP 124/80   Pulse 79   Ht 5' (1.524 m)   Wt 124 lb 3.2 oz (56.3 kg)   SpO2 96%   BMI 24.26 kg/m  General appearance: alert, cooperative, appears stated age, no distress and Well groomed. She does seem a little bit frail. Neck: no adenopathy, no carotid bruit and no JVD HEENT: [place]/AT, EOMI, MMM, anicteric sclera Lungs: mostly clear to auscultation bilaterally, normal percussion bilaterally and non-labored; intermittent wheezing Heart: RRR, S1& S2 normal, + 2/6 SEM @ RUSB - no DM.  No R/G; non-displaced PMI.  Abdomen: soft, non-tender; bowel sounds normal; no masses,  no organomegaly; no HSM Extremities: extremities normal, atraumatic, no cyanosis, and trivial edema L>R Pulses: 2+ and symmetric;  Skin: thin, frail skin - lots of buises from venopuncture.  No rash o Neurologic: Mental status: Alert, oriented x3; thought content appropriate. Normal mood and affect.    Adult ECG Report n/a  Other studies Reviewed: Additional studies/ records that were reviewed today include:  Recent Labs:   Lab Results  Component Value Date   CHOL 148 01/09/2016   HDL 75 01/09/2016   LDLCALC 69 01/09/2016   TRIG 21 01/09/2016   CHOLHDL 2.0 01/09/2016   Lab Results  Component Value Date   CREATININE 0.97 04/16/2016   BUN 12 04/16/2016   NA 139 04/16/2016   K 4.2 04/16/2016   CL 108 04/16/2016   CO2 24 04/16/2016    ASSESSMENT / PLAN: Problem List Items Addressed This Visit    Coronary artery disease involving native coronary artery of native heart with angina pectoris (HCC) - Primary (Chronic)    Status  post PCI to the RCA. She is not having any anginal symptoms. She is on aspirin and Plavix. She does take moderate dose atorvastatin. She is on low-dose beta blocker which for her LVOT gradient is warranted.      DOE (dyspnea on exertion)    Very multifactorial. She is extremely deconditioned having had several hospitalizations since her hospital discharge from her MI and has not had a chance yet to recover from her MI alone various infectious problems. Recheck 2-D echocardiogram to get a new assessment.   Continue to encourage gradually increasing level activity. Check a CBC just to make sure she is not anemic along with TSH given her general fatigue.      Relevant Orders   TSH   CBC   ECHOCARDIOGRAM COMPLETE   Dynamic left ventricular outflow obstruction (Chronic)    Thankfully, no real diastolic heart failure symptoms. She is just simply very susceptible to dehydration. Would prefer to avoid significant afterload reduction, she is hypertensive. At this point I would simply his beta blocker. Avoid dehydration.  Recheck 2-D echo to reassess LVOT/Sam especially since she is more short of breath than usual.      Hypertension    Well-controlled on low-dose beta blocker.      Other fatigue    Probably simply from deconditioning. Check TSH and CBC.      Relevant Orders   TSH   CBC   ECHOCARDIOGRAM COMPLETE   ST elevation myocardial infarction (STEMI) of inferior wall, subsequent episode of care (HCC) (Chronic)    Thankfully, she had no significant regional wall motion abnormalities on echocardiogram. No systolic heart failure. No further anginal symptoms.         Current medicines are reviewed at length with the patient today. (+/- concerns) n/a The following changes have been made: n/a  Patient Instructions  SCHEDULE [address] [phone] Your physician has requested that you have an echocardiogram. Echocardiography is a painless test that uses sound waves to  create images of your heart. It provides your doctor with information about the size and shape of your heart and how well your heart's chambers and valves are working. This procedure takes approximately one hour. There are no restrictions for this procedure.   LABS -- CBC,TSH-- [facility] --AT THE ABOVE AD[address]ESS [address]  - FIRST FLOOR   Your physician recommends that you schedule a follow-up appointment in 1 MONTH WITH DR Oshae Simmering.    Studies Ordered:   Orders Placed This Encounter  Procedures  . TSH  . CBC  . ECHOCARDIOGRAM COMPLETE      Bryan Lemma, M.D., M.S. Interventional Cardiologist   Pager # [phone] Phone # [phone] [address] [place], [place] [address]

## 2016-06-26 ENCOUNTER — Encounter: Payer: Self-pay | Admitting: Cardiology

## 2016-06-26 NOTE — Assessment & Plan Note (Signed)
Thankfully, she had no significant regional wall motion abnormalities on echocardiogram. No systolic heart failure. No further anginal symptoms.

## 2016-06-26 NOTE — Assessment & Plan Note (Addendum)
Very multifactorial. She is extremely deconditioned having had several hospitalizations since her hospital discharge from her MI and has not had a chance yet to recover from her MI alone various infectious problems. Recheck 2-D echocardiogram to get a new assessment.   Continue to encourage gradually increasing level activity. Check a CBC just to make sure she is not anemic along with TSH given her general fatigue.

## 2016-06-26 NOTE — Assessment & Plan Note (Signed)
Well-controlled on low-dose beta-blocker. 

## 2016-06-26 NOTE — Assessment & Plan Note (Signed)
Probably simply from deconditioning. Check TSH and CBC.

## 2016-06-26 NOTE — Assessment & Plan Note (Signed)
Thankfully, no real diastolic heart failure symptoms. She is just simply very susceptible to dehydration. Would prefer to avoid significant afterload reduction, she is hypertensive. At this point I would simply his beta blocker. Avoid dehydration.  Recheck 2-D echo to reassess LVOT/Sam especially since she is more short of breath than usual.

## 2016-06-26 NOTE — Assessment & Plan Note (Signed)
Status post PCI to the RCA. She is not having any anginal symptoms. She is on aspirin and Plavix. She does take moderate dose atorvastatin. She is on low-dose beta blocker which for her LVOT gradient is warranted.

## 2016-07-08 ENCOUNTER — Other Ambulatory Visit: Payer: Self-pay

## 2016-07-08 ENCOUNTER — Other Ambulatory Visit: Payer: Medicare Other | Admitting: *Deleted

## 2016-07-08 ENCOUNTER — Ambulatory Visit (HOSPITAL_COMMUNITY): Payer: Medicare Other | Attending: Cardiovascular Disease

## 2016-07-08 DIAGNOSIS — I252 Old myocardial infarction: Secondary | ICD-10-CM | POA: Diagnosis not present

## 2016-07-08 DIAGNOSIS — I10 Essential (primary) hypertension: Secondary | ICD-10-CM | POA: Insufficient documentation

## 2016-07-08 DIAGNOSIS — I959 Hypotension, unspecified: Secondary | ICD-10-CM | POA: Insufficient documentation

## 2016-07-08 DIAGNOSIS — I340 Nonrheumatic mitral (valve) insufficiency: Secondary | ICD-10-CM | POA: Insufficient documentation

## 2016-07-08 DIAGNOSIS — I2510 Atherosclerotic heart disease of native coronary artery without angina pectoris: Secondary | ICD-10-CM | POA: Insufficient documentation

## 2016-07-08 DIAGNOSIS — Z87891 Personal history of nicotine dependence: Secondary | ICD-10-CM | POA: Insufficient documentation

## 2016-07-08 DIAGNOSIS — D649 Anemia, unspecified: Secondary | ICD-10-CM | POA: Insufficient documentation

## 2016-07-08 DIAGNOSIS — R5383 Other fatigue: Secondary | ICD-10-CM | POA: Insufficient documentation

## 2016-07-08 DIAGNOSIS — R0609 Other forms of dyspnea: Secondary | ICD-10-CM | POA: Diagnosis present

## 2016-07-08 DIAGNOSIS — I071 Rheumatic tricuspid insufficiency: Secondary | ICD-10-CM | POA: Diagnosis not present

## 2016-07-08 DIAGNOSIS — I2119 ST elevation (STEMI) myocardial infarction involving other coronary artery of inferior wall: Secondary | ICD-10-CM

## 2016-07-08 HISTORY — PX: TRANSTHORACIC ECHOCARDIOGRAM: SHX275

## 2016-07-08 NOTE — Addendum Note (Signed)
Addended by: Bryauna Byrum K on: 07/08/2016 12:16 PM   Modules accepted: Orders  

## 2016-07-08 NOTE — Addendum Note (Signed)
Addended by: Brody Bonneau K on: 07/08/2016 12:16 PM   Modules accepted: Orders  

## 2016-07-08 NOTE — Addendum Note (Signed)
Addended by: Tonita Phoenix on: 07/08/2016 12:16 PM   Modules accepted: Orders

## 2016-07-09 LAB — CBC
HEMATOCRIT: 33.9 % — AB (ref 34.0–46.6)
HEMOGLOBIN: 10.7 g/dL — AB (ref 11.1–15.9)
MCH: 27.7 pg (ref 26.6–33.0)
MCHC: 31.6 g/dL (ref 31.5–35.7)
MCV: 88 fL (ref 79–97)
Platelets: 361 10*3/uL (ref 150–379)
RBC: 3.86 x10E6/uL (ref 3.77–5.28)
RDW: 14.8 % (ref 12.3–15.4)
WBC: 8.9 10*3/uL (ref 3.4–10.8)

## 2016-07-09 LAB — TSH: TSH: 3.27 u[IU]/mL (ref 0.450–4.500)

## 2016-07-16 ENCOUNTER — Telehealth: Payer: Self-pay | Admitting: *Deleted

## 2016-07-16 NOTE — Telephone Encounter (Signed)
-----   Message from Marykay Lex, MD sent at 07/14/2016 12:13 AM EDT ----- Echocardiogram results are relatively stable compared to hospitalization. As previously described, left ventricle is somewhat vigorous and there is a dynamic outflow tract obstruction. The gradient seems to be a lot better than it was in the hospital should indicate better hydration and lack of anemia. The heart as such has difficulty relaxing to fill and therefore requires adequate hydration. We then perform a fine line between keeping her hydrated and getting volume overloaded. His lower she's not having PND orthopnea, hydration is okay. Alexis Yates certainly this could play a role and shortness of breath with exertion as indicated by the fact there is high pulmonary pressures. Regardless. This is a stable finding and nothing new since the time of her car to catheterization.  Would not make dramatic adjustments on her regimen  Bryan Lemma, MD

## 2016-07-16 NOTE — Telephone Encounter (Signed)
-----   Message from Marykay Lex, MD sent at 07/14/2016 12:08 AM EDT ----- Thankfully, it looks like thyroid functions normalized. Also hemoglobin level has notably improved. Still a little bit low, but better than 2-3 months ago. Could still is a little bit of fatigue, but at this level, one would not expect significant symptoms.  dh

## 2016-07-16 NOTE — Telephone Encounter (Signed)
Spoke to patient.  Labs , echo Result given . Verbalized understanding  Patient states she has uti now - and taking antibiotic prescribed by dr gates

## 2016-07-20 ENCOUNTER — Other Ambulatory Visit: Payer: Self-pay | Admitting: Internal Medicine

## 2016-07-20 DIAGNOSIS — R102 Pelvic and perineal pain: Secondary | ICD-10-CM

## 2016-07-27 ENCOUNTER — Ambulatory Visit
Admission: RE | Admit: 2016-07-27 | Discharge: 2016-07-27 | Disposition: A | Discharge status: Home / Self Care | Payer: Medicare Other | Source: Ambulatory Visit | Attending: Internal Medicine | Admitting: Internal Medicine

## 2016-07-27 DIAGNOSIS — R102 Pelvic and perineal pain: Secondary | ICD-10-CM

## 2016-07-30 ENCOUNTER — Other Ambulatory Visit: Payer: Self-pay

## 2016-08-02 ENCOUNTER — Encounter: Payer: Self-pay | Admitting: Cardiology

## 2016-08-02 ENCOUNTER — Ambulatory Visit (INDEPENDENT_AMBULATORY_CARE_PROVIDER_SITE_OTHER): Payer: Medicare Other | Admitting: Cardiology

## 2016-08-02 DIAGNOSIS — R5383 Other fatigue: Secondary | ICD-10-CM

## 2016-08-02 DIAGNOSIS — E785 Hyperlipidemia, unspecified: Secondary | ICD-10-CM | POA: Diagnosis not present

## 2016-08-02 DIAGNOSIS — I25119 Atherosclerotic heart disease of native coronary artery with unspecified angina pectoris: Secondary | ICD-10-CM | POA: Diagnosis not present

## 2016-08-02 DIAGNOSIS — I5189 Other ill-defined heart diseases: Secondary | ICD-10-CM | POA: Diagnosis not present

## 2016-08-02 DIAGNOSIS — I2119 ST elevation (STEMI) myocardial infarction involving other coronary artery of inferior wall: Secondary | ICD-10-CM | POA: Diagnosis not present

## 2016-08-02 DIAGNOSIS — I10 Essential (primary) hypertension: Secondary | ICD-10-CM

## 2016-08-02 MED ORDER — METOPROLOL TARTRATE 25 MG PO TABS: 25.0000 mg | ORAL_TABLET | Freq: Two times a day (BID) | ORAL | 3 refills | Status: DC

## 2016-08-02 NOTE — Progress Notes (Signed)
PCP: Marden Noble, MD  Clinic Note: Chief Complaint  Patient presents with  . Follow-up    CAD; fatigue    HPI: Alexis Yates is a 83 y.o. female with a PMH below who presents today for 6 weeks follow-up for CAD-non-STEMI in October 2017. - Occluded RCA treated with DES stent. 70% OM 90% D2 lesion treated medically. EF 60-65% with apical inferior hypokinesis. She also has dynamic LV outflow tract obstruction.  [name] was last seen on April 12 - this was after multiple different hospitalizations for UTI infections.  She noted feeling profoundly deconditioned. She was very short of breath doing this but anything. She is very weak. However shortly before I saw her she was able to walk around [place] pushing the cart.   I rechecked an echocardiogram to reassess her dyspnea.  Recent Hospitalizations: None since last visit  Studies Personally Reviewed - (if available, images/films reviewed: From Epic Chart or Care Everywhere)  2-D ECHO 07/08/2016:moderate basal hypertrophy and mild concentric hypertrophy. EF 60-65%. Dynamic outflow tract obstruction (peak gradient 17 mmHg). No aortic stenosis. GR 1 DD. Systolic anterior motion of mitral valve. No MR. Elevated PA pressures estimated at 59 mmHg.  Interval History: Alexis Yates presents today actually doing better. She is starting to feel a little better, gradually building back her strength. She still notes some exertional dyspnea but doing better than before. No PND, orthopnea or edema. No further chest tightness pressure with rest or exertion. She is having some dizziness and fatigue when she exerts herself, but is for the most part overall improving. No significant rapid irregular heartbeats or palpitations. No syncope/near syncope or TIA/amaurosis fugax. She is A lot of bruising on her ankles and an shins. Also in her upper extremities. But no blood in her stools, dark tarry stools or blood in urine.  No claudication.  ROS: A  comprehensive was performed. Review of Systems  Constitutional: Positive for malaise/fatigue.       Overall improving  Respiratory: Positive for shortness of breath and wheezing.        Less productive cough. Breathing is improved.  Musculoskeletal: Positive for joint pain and myalgias. Negative for falls.  Neurological: Positive for weakness (Gradually getting stronger).  Endo/Heme/Allergies: Negative for environmental allergies. Bruises/bleeds easily.  Psychiatric/Behavioral: Positive for memory loss. The patient is not nervous/anxious and does not have insomnia.   All other systems reviewed and are negative.  I have reviewed and (if needed) personally updated the patient's problem list, medications, allergies, past medical and surgical history, social and family history.   Past Medical History:  Diagnosis Date  . Arthritis   . Atherosclerosis of aorta (HCC)   . CAD (coronary artery disease)    a. 10/27 PCI with DES to RCA, diffuse nonobstructive disease, EF 50-55%  . Chronic pain   . Diverticulosis   . Dynamic left ventricular outflow obstruction    Mod focal basal LVH. EF 60-65% w/ dynamic LVOT obstruction (Peak Gradient 138 mmHg). No RWMA. Gr 2 DD - high filling pressures. Severe SAM. ~ PAP 35 mmHg.  Marland Kitchen Elevated transaminase level   . GERD (gastroesophageal reflux disease)   . Glaucoma   . Hiatal hernia   . Hypertension   . Lung nodules    Right  . MI (myocardial infarction) (HCC) 01/09/2016  . Scoliosis   . UTI (urinary tract infection)    refractory    Past Surgical History:  Procedure Laterality Date  . ABDOMINAL HYSTERECTOMY    .  CARDIAC CATHETERIZATION N/A 01/09/2016   Procedure: Left Heart Cath and Coronary Angiography;  Surgeon: Marykay Lex, MD;  Location: [facility] INVASIVE CV LAB: mRCA 100% thrombotic lesion (PCI). o-pLAD 40%, mLAD 30%, oD1 90%. OM1 (small) 70%.  . CARDIAC CATHETERIZATION N/A 01/09/2016   Procedure: Coronary Stent Intervention;  Surgeon: Marykay Lex, MD;  Location: [facility] INVASIVE CV LAB: mRCA PCI - STENT PROMUS PREM MR 2.25X28 drug eluting stent  . EYE SURGERY    . FRACTURE SURGERY     Tibia/fibula of right leg  . TRANSTHORACIC ECHOCARDIOGRAM  01/11/2016   Mod focal basal LVH. EF 60-65% w/ dynamic LVOT obstruction (Peak Gradient 138 mmHg). No RWMA. Gr 2 DD - high filling pressures. Severe SAM. ~ PAP 35 mmHg.  Marland Kitchen TRANSTHORACIC ECHOCARDIOGRAM  07/08/2016   moderate basal hypertrophy and mild concentric hypertrophy. EF 60-65%. Dynamic outflow tract obstruction (peak gradient 17 mmHg). No aortic stenosis. GR 1 DD. Systolic anterior motion of mitral valve. No MR. Elevated PA pressures estimated at 59 mmHg.   Diagnostic CATH Diagram 12/2015                                       Post-Intervention Diagram                                                                      mRCA PCI: PROMUS PREM MR 7.82U23 DES           Current Meds  Medication Sig  . ALPRAZolam (XANAX) 0.25 MG tablet Take 1 tablet (0.25 mg total) by mouth 2 (two) times daily as needed for anxiety.  Marland Kitchen aspirin EC 81 MG EC tablet Take 1 tablet (81 mg total) by mouth daily.  Marland Kitchen atorvastatin (LIPITOR) 40 MG tablet Take 1 tablet by mouth daily.  . bimatoprost (LUMIGAN) 0.01 % SOLN Place 1 drop into the left eye at bedtime.   . calcium-vitamin D 250-100 MG-UNIT per tablet Take 1 tablet by mouth 2 (two) times daily.  . celecoxib (CELEBREX) 200 MG capsule Take 200 mg by mouth daily.   . Cholecalciferol (VITAMIN D3) 1000 units CAPS Take 1,000 Units by mouth every morning.  . clopidogrel (PLAVIX) 75 MG tablet Take 1 tablet (75 mg total) by mouth daily.  Marland Kitchen docusate sodium (COLACE) 100 MG capsule Take 100 mg by mouth 2 (two) times daily.   . dorzolamide (TRUSOPT) 2 % ophthalmic solution Place 1 drop into both eyes 2 (two) times daily.  Marland Kitchen esomeprazole (NEXIUM) 40 MG capsule Take 40 mg by mouth daily.  Marland Kitchen levofloxacin (LEVAQUIN) 500 MG tablet Take 1 tablet (500 mg total) by mouth every  other day.  Marland Kitchen OLANZapine-FLUoxetine (SYMBYAX) 3-25 MG capsule Take 1 capsule by mouth every evening.  Marland Kitchen oxyCODONE (OXY IR/ROXICODONE) 5 MG immediate release tablet Take 1 tablet (5 mg total) by mouth every 6 (six) hours as needed for severe pain (pain). (Patient taking differently: Take 5 mg by mouth every 8 (eight) hours as needed for severe pain (for breakthrough pain). )  . oxymorphone 10 MG PO T12A 12 hr tablet Take 1 tablet (10 mg total) by mouth every 12 (twelve) hours.  Marland Kitchen  Polyethyl Glycol-Propyl Glycol (SYSTANE ULTRA) 0.4-0.3 % SOLN Place 1 drop into both eyes 2 (two) times daily.  . pramipexole (MIRAPEX) 0.25 MG tablet Take 0.25 mg by mouth at bedtime.  . prednisoLONE acetate (PRED FORTE) 1 % ophthalmic suspension Place 1 drop into the right eye 2 (two) times daily.   Marland Kitchen rOPINIRole (REQUIP) 0.25 MG tablet Take 0.25 mg by mouth daily as needed (for restless legs).  . senna (SENOKOT) 8.6 MG TABS tablet Take 2 tablets (17.2 mg total) by mouth at bedtime as needed for mild constipation. (Patient taking differently: Take by mouth at bedtime as needed for mild constipation. )  . venlafaxine XR (EFFEXOR-XR) 75 MG 24 hr capsule Take 75 mg by mouth daily.  . [DISCONTINUED] metoprolol tartrate (LOPRESSOR) 25 MG tablet Take 0.5 tablets (12.5 mg total) by mouth 2 (two) times daily.    Allergies  Allergen Reactions  . Penicillins Anaphylaxis    Has patient had a PCN reaction causing immediate rash, facial/tongue/throat swelling, SOB or lightheadedness with hypotension: Yes Has patient had a PCN reaction causing severe rash involving mucus membranes or skin necrosis: No Has patient had a PCN reaction that required hospitalization Yes Has patient had a PCN reaction occurring within the last 10 years: No If all of the above answers are "NO", then may proceed with Cephalosporin use.   Marland Kitchen Phenergan [Promethazine Hcl] Other (See Comments)    Restless legs    Social History   Social History  . Marital  status: Widowed    Spouse name: N/A  . Number of children: 2  . Years of education: N/A   Occupational History  . Retired from school system    Social History Main Topics  . Smoking status: Former Games developer  . Smokeless tobacco: Never Used  . Alcohol use No  . Drug use: No  . Sexual activity: Not Asked   Other Topics Concern  . None   Social History Narrative   Widowed.  Lives alone.  Ambulates with a cane.    family history includes Cancer in her brother; Diabetes in her mother; Heart disease in her father; Heart failure in her mother.  Wt Readings from Last 3 Encounters:  08/02/16 123 lb (55.8 kg)  06/24/16 124 lb 3.2 oz (56.3 kg)  04/23/16 116 lb (52.6 kg)    PHYSICAL EXAM BP 136/84   Pulse 93   Ht 5' (1.524 m)   Wt 123 lb (55.8 kg)   SpO2 95%   BMI 24.02 kg/m  General appearance: alert, cooperative, appears stated age, no distress. Elderly woman. Well-nourished and well-groomed. Notably frail, but improved from last visit. HEENT: [place]/AT, EOMI, MMM, anicteric sclera Neck: no adenopathy, no carotid bruit and no JVD Lungs: CTAB, nonlabored, good air movement. Mild interstitial sounds but no W/R/R  Heart: RRR with normal S1 and S2. 2/6 C-D SEM at RUSB. No obvious DM. Nondisplaced PMI. No other R/G. Abdomen: soft, non-tender; bowel sounds normal; no masses,  no organomegaly; no HSM/HJR Extremities: extremities normal, atraumatic, no cyanosis, and edema -trivial L>R:  She has extensive bruising in her lower extremities shins and ankles. Several areas of superficial bleed Pulses: 2+ and symmetric;  Neurologic: Mental status: Alert & oriented x 3, thought content appropriate; non-focal exam.  Pleasant mood & affect.   Adult ECG Report -n/a  Other studies Reviewed: Additional studies/ records that were reviewed today include:  Recent Labs:  Followed by PCP Lab Results  Component Value Date   CHOL 148 01/09/2016  HDL 75 01/09/2016   LDLCALC 69 01/09/2016   TRIG 21  01/09/2016   CHOLHDL 2.0 01/09/2016    ASSESSMENT / PLAN: Problem List Items Addressed This Visit    Coronary artery disease involving native coronary artery of native heart with angina pectoris (HCC) (Chronic)    History of PCI to the RCA. No further anginal symptoms. Preserved EF by echo. At this point I think are okay stopping aspirin (especially since he takes Celebrex. Continue statin, Plavix and beta blocker.      Relevant Medications   metoprolol tartrate (LOPRESSOR) 25 MG tablet   Dynamic left ventricular outflow obstruction (Chronic)    Despite outflow tract gradient, she does not have any significant diastolic heart failure symptoms. Plan: Continue beta blocker. Avoid dehydration.       Relevant Medications   metoprolol tartrate (LOPRESSOR) 25 MG tablet   Dyslipidemia, goal LDL below 70 (Chronic)    On moderate dose atorvastatin. Last lipids appear to be a goal based on screening labs. Continue to monitor.      Relevant Medications   metoprolol tartrate (LOPRESSOR) 25 MG tablet   Hypertension (Chronic)    Relatively well-controlled on current dose of beta blocker. I would increase it to 25 twice a day however for more efficiency.      Relevant Medications   metoprolol tartrate (LOPRESSOR) 25 MG tablet   Other fatigue    Probably now recovering from recent hospitalizations. Cardiac seems stable.      ST elevation myocardial infarction (STEMI) of inferior wall, subsequent episode of care (HCC) (Chronic)    Preserved EF with normal wall motion. No real signs of heart failure.      Relevant Medications   metoprolol tartrate (LOPRESSOR) 25 MG tablet      Current medicines are reviewed at length with the patient today. (+/- concerns) Bruising The following changes have been made: Stop aspirin  Patient Instructions  MEDICATION CHANGE   --INCREASE METOPROLOL TARTRATE 25 MG ONE TABLET TWICE A DAY  Stop aspirin   Your physician wants you to follow-up in 6  MONTHS WITH DR HARDING.You will receive a reminder letter in the mail two months in advance. If you don't receive a letter, please call our office to schedule the follow-up appointment.    If you need a refill on your cardiac medications before your next appointment, please call your pharmacy.    Studies Ordered:   No orders of the defined types were placed in this encounter.     Bryan Lemma, M.D., M.S. Interventional Cardiologist   Pager # [phone] Phone # [phone] [address] [place], [place] [address]

## 2016-08-02 NOTE — Patient Instructions (Addendum)
MEDICATION CHANGE   --INCREASE METOPROLOL TARTRATE 25 MG ONE TABLET TWICE A DAY  Stop aspirin   Your physician wants you to follow-up in 6 MONTHS WITH DR HARDING.You will receive a reminder letter in the mail two months in advance. If you don't receive a letter, please call our office to schedule the follow-up appointment.    If you need a refill on your cardiac medications before your next appointment, please call your pharmacy.

## 2016-08-04 ENCOUNTER — Encounter: Payer: Self-pay | Admitting: Cardiology

## 2016-08-04 DIAGNOSIS — E785 Hyperlipidemia, unspecified: Secondary | ICD-10-CM | POA: Insufficient documentation

## 2016-08-04 NOTE — Assessment & Plan Note (Signed)
On moderate dose atorvastatin. Last lipids appear to be a goal based on screening labs. Continue to monitor.

## 2016-08-04 NOTE — Assessment & Plan Note (Signed)
History of PCI to the RCA. No further anginal symptoms. Preserved EF by echo. At this point I think are okay stopping aspirin (especially since he takes Celebrex. Continue statin, Plavix and beta blocker.

## 2016-08-04 NOTE — Assessment & Plan Note (Signed)
Despite outflow tract gradient, she does not have any significant diastolic heart failure symptoms. Plan: Continue beta blocker. Avoid dehydration.

## 2016-08-04 NOTE — Assessment & Plan Note (Signed)
Preserved EF with normal wall motion. No real signs of heart failure.

## 2016-08-04 NOTE — Assessment & Plan Note (Signed)
Relatively well-controlled on current dose of beta blocker. I would increase it to 25 twice a day however for more efficiency.

## 2016-08-04 NOTE — Assessment & Plan Note (Signed)
Probably now recovering from recent hospitalizations. Cardiac seems stable.

## 2016-08-30 ENCOUNTER — Telehealth: Payer: Self-pay | Admitting: Cardiology

## 2016-08-30 NOTE — Telephone Encounter (Signed)
New message  Pt c/o swelling: STAT is pt has developed SOB within 24 hours  1. How long have you been experiencing swelling? Per daughter called since Thursday    2. Where is the swelling located? Per daughter since Ankles / legs   3.  Are you currently taking a "fluid pill"? Per daughter pretty sure mother is    4.  Are you currently SOB? Yes   5.  Have you traveled recently? No

## 2016-08-30 NOTE — Telephone Encounter (Signed)
Alexis Yates (daughter) she states that she is unsure if pt is taking a water pill. She states that pt has increased SOB, She states that pt's LE's are swollen but she has not seen pt since Thursday, she states that pt does weigh herself daily but will not tell her daughter of the result she states that if I call her she will tell me and answer all questions needed. She states that pt denies chest pain or pressure, or any other sx.  S/w pt she states that she has no water pill, denies any CP or pressure, she states that her weight is up 16# in the last month she attributes this to her antidepressant her PCP told her that she would gain weight on this medication. She states that she is only SOB  On exertion. She further states that she has "very bad knees" she states that she has osteoarthritis and osteoporosis and thinks that this may contribute to LE swelling. She also thinks that she has UTI. She states that she will call her PCP and she what he says and will call back if anything else is needed.

## 2016-09-16 ENCOUNTER — Telehealth: Payer: Self-pay | Admitting: Physician Assistant

## 2016-09-16 NOTE — Telephone Encounter (Signed)
Paged by answering service. Patient was waiting in some office for 2 hours. Noted some ankle swelling. It was hot outside and went into restaurant for lunch. While waiting to be seated, she just "collapsed". No prodrome. No LOC. EMS person who was eating in same restaurant checked pulse --> normal. She able to finish lunch. No symptoms afterwards. Daughter insisted to call cardiology. Asymptomatic except ankle edema. Seems she might be dehydrated. Advised to follow up with PCP. If recurrent symptoms go to ER. Patient agreed with plan and appreciate  for call.

## 2016-09-22 ENCOUNTER — Inpatient Hospital Stay (HOSPITAL_COMMUNITY)
Admission: EM | Admit: 2016-09-22 | Discharge: 2016-09-28 | DRG: 291 | Disposition: A | Discharge status: Skilled Nursing Facility | Payer: Medicare Other | Attending: Family Medicine | Admitting: Family Medicine

## 2016-09-22 ENCOUNTER — Emergency Department (HOSPITAL_COMMUNITY): Payer: Medicare Other

## 2016-09-22 ENCOUNTER — Encounter (HOSPITAL_COMMUNITY): Payer: Self-pay | Admitting: Emergency Medicine

## 2016-09-22 DIAGNOSIS — G894 Chronic pain syndrome: Secondary | ICD-10-CM | POA: Diagnosis present

## 2016-09-22 DIAGNOSIS — M419 Scoliosis, unspecified: Secondary | ICD-10-CM | POA: Diagnosis present

## 2016-09-22 DIAGNOSIS — I25119 Atherosclerotic heart disease of native coronary artery with unspecified angina pectoris: Secondary | ICD-10-CM | POA: Diagnosis present

## 2016-09-22 DIAGNOSIS — J9602 Acute respiratory failure with hypercapnia: Secondary | ICD-10-CM | POA: Diagnosis present

## 2016-09-22 DIAGNOSIS — G8929 Other chronic pain: Secondary | ICD-10-CM | POA: Diagnosis present

## 2016-09-22 DIAGNOSIS — R0600 Dyspnea, unspecified: Secondary | ICD-10-CM | POA: Diagnosis present

## 2016-09-22 DIAGNOSIS — I5033 Acute on chronic diastolic (congestive) heart failure: Secondary | ICD-10-CM | POA: Diagnosis present

## 2016-09-22 DIAGNOSIS — I5032 Chronic diastolic (congestive) heart failure: Secondary | ICD-10-CM | POA: Insufficient documentation

## 2016-09-22 DIAGNOSIS — I509 Heart failure, unspecified: Secondary | ICD-10-CM

## 2016-09-22 DIAGNOSIS — I252 Old myocardial infarction: Secondary | ICD-10-CM

## 2016-09-22 DIAGNOSIS — Z7902 Long term (current) use of antithrombotics/antiplatelets: Secondary | ICD-10-CM

## 2016-09-22 DIAGNOSIS — E785 Hyperlipidemia, unspecified: Secondary | ICD-10-CM | POA: Diagnosis present

## 2016-09-22 DIAGNOSIS — K5790 Diverticulosis of intestine, part unspecified, without perforation or abscess without bleeding: Secondary | ICD-10-CM | POA: Diagnosis present

## 2016-09-22 DIAGNOSIS — K219 Gastro-esophageal reflux disease without esophagitis: Secondary | ICD-10-CM | POA: Diagnosis present

## 2016-09-22 DIAGNOSIS — Z881 Allergy status to other antibiotic agents status: Secondary | ICD-10-CM

## 2016-09-22 DIAGNOSIS — Z7982 Long term (current) use of aspirin: Secondary | ICD-10-CM

## 2016-09-22 DIAGNOSIS — R739 Hyperglycemia, unspecified: Secondary | ICD-10-CM | POA: Diagnosis present

## 2016-09-22 DIAGNOSIS — Z833 Family history of diabetes mellitus: Secondary | ICD-10-CM

## 2016-09-22 DIAGNOSIS — R918 Other nonspecific abnormal finding of lung field: Secondary | ICD-10-CM | POA: Diagnosis present

## 2016-09-22 DIAGNOSIS — R0902 Hypoxemia: Secondary | ICD-10-CM

## 2016-09-22 DIAGNOSIS — J439 Emphysema, unspecified: Secondary | ICD-10-CM | POA: Diagnosis present

## 2016-09-22 DIAGNOSIS — E874 Mixed disorder of acid-base balance: Secondary | ICD-10-CM | POA: Diagnosis present

## 2016-09-22 DIAGNOSIS — Z90710 Acquired absence of both cervix and uterus: Secondary | ICD-10-CM

## 2016-09-22 DIAGNOSIS — T4275XA Adverse effect of unspecified antiepileptic and sedative-hypnotic drugs, initial encounter: Secondary | ICD-10-CM | POA: Diagnosis present

## 2016-09-22 DIAGNOSIS — R0609 Other forms of dyspnea: Secondary | ICD-10-CM | POA: Diagnosis present

## 2016-09-22 DIAGNOSIS — Z888 Allergy status to other drugs, medicaments and biological substances status: Secondary | ICD-10-CM

## 2016-09-22 DIAGNOSIS — I700 Atherosclerosis of aorta: Secondary | ICD-10-CM | POA: Diagnosis present

## 2016-09-22 DIAGNOSIS — I071 Rheumatic tricuspid insufficiency: Secondary | ICD-10-CM | POA: Diagnosis present

## 2016-09-22 DIAGNOSIS — D649 Anemia, unspecified: Secondary | ICD-10-CM | POA: Diagnosis present

## 2016-09-22 DIAGNOSIS — I272 Pulmonary hypertension, unspecified: Secondary | ICD-10-CM | POA: Diagnosis present

## 2016-09-22 DIAGNOSIS — Z87891 Personal history of nicotine dependence: Secondary | ICD-10-CM

## 2016-09-22 DIAGNOSIS — Z8249 Family history of ischemic heart disease and other diseases of the circulatory system: Secondary | ICD-10-CM

## 2016-09-22 DIAGNOSIS — H409 Unspecified glaucoma: Secondary | ICD-10-CM | POA: Diagnosis present

## 2016-09-22 DIAGNOSIS — Z809 Family history of malignant neoplasm, unspecified: Secondary | ICD-10-CM

## 2016-09-22 DIAGNOSIS — R0689 Other abnormalities of breathing: Secondary | ICD-10-CM

## 2016-09-22 DIAGNOSIS — K449 Diaphragmatic hernia without obstruction or gangrene: Secondary | ICD-10-CM | POA: Diagnosis present

## 2016-09-22 DIAGNOSIS — I110 Hypertensive heart disease with heart failure: Principal | ICD-10-CM | POA: Diagnosis present

## 2016-09-22 DIAGNOSIS — J9601 Acute respiratory failure with hypoxia: Secondary | ICD-10-CM

## 2016-09-22 LAB — CBC
HCT: 34 % — ABNORMAL LOW (ref 36.0–46.0)
Hemoglobin: 10.5 g/dL — ABNORMAL LOW (ref 12.0–15.0)
MCH: 26.5 pg (ref 26.0–34.0)
MCHC: 30.9 g/dL (ref 30.0–36.0)
MCV: 85.9 fL (ref 78.0–100.0)
PLATELETS: 236 10*3/uL (ref 150–400)
RBC: 3.96 MIL/uL (ref 3.87–5.11)
RDW: 17.3 % — ABNORMAL HIGH (ref 11.5–15.5)
WBC: 11.4 10*3/uL — ABNORMAL HIGH (ref 4.0–10.5)

## 2016-09-22 LAB — BASIC METABOLIC PANEL
Anion gap: 7 (ref 5–15)
BUN: 16 mg/dL (ref 6–20)
CALCIUM: 9 mg/dL (ref 8.9–10.3)
CO2: 26 mmol/L (ref 22–32)
CREATININE: 0.85 mg/dL (ref 0.44–1.00)
Chloride: 102 mmol/L (ref 101–111)
GFR calc non Af Amer: >60 mL/min (ref >60)
GLUCOSE: 120 mg/dL — AB (ref 65–99)
Potassium: 4.4 mmol/L (ref 3.5–5.1)
Sodium: 135 mmol/L (ref 135–145)

## 2016-09-22 LAB — I-STAT TROPONIN, ED: TROPONIN I, POC: 0.01 ng/mL (ref 0.00–0.08)

## 2016-09-22 MED ORDER — OXYCODONE HCL 5 MG PO TABS
5.0000 mg | ORAL_TABLET | Freq: Once | ORAL | Status: AC
  Administered 2016-09-23: 5 mg via ORAL
  Filled 2016-09-22: qty 1

## 2016-09-22 NOTE — ED Triage Notes (Signed)
Pt fell backwards while getting shirt out of closet around 11am.  Denies dizziness or any symptoms that caused fall.  C/o pain and bruising to R hand.  Reports fall while at [facility] last week that they thought may be heat related.  Reports hematoma to back of head from that fall.    Also reports sob and swelling to bilateral feet x 2 weeks.  O2 sats 89-90% on Room air.  Pt placed on 2liters O2 via [place].

## 2016-09-22 NOTE — ED Provider Notes (Signed)
[facility]-EMERGENCY DEPT Provider Note   CSN: [id number] Arrival date & time: 09/22/16  2025   By signing my name below, I, Freida Busman, attest that this documentation has been prepared under the direction and in the presence of Pricilla Loveless, MD . Electronically Signed: Freida Busman, Scribe. 09/22/2016. 11:43 PM.   History   Chief Complaint Chief Complaint  Patient presents with  . Fall  . Shortness of Breath    The history is provided by the patient and a relative (daughter). No language interpreter was used.    HPI Comments:  Alexis Yates is a 83 y.o. female who presents to the Emergency Department s/p fall this evening complaining of an injury to the right thumb following the incident. Pt states she fell backwards and landed on her buttocks. Pt notes associated pain to her buttocks. She denies head injury and LOC.She notes this is the second fall in 1 week. During the first fall she hit her head and notes mild pain to the top of her head but denies HA.  Pt is unsure what caused her to fall today; denies dizziness prior to fall. She also notes SOB, and BLE swelling x a few weeks worse at night. She has a  h/o CHF, CAD, and MI. No HA, CP, abdominal pain, or diarrhea. No alleviating factors noted. Pt's daughter also notes pt's face appears to be more swollen than normal.  Past Medical History:  Diagnosis Date  . Arthritis   . Atherosclerosis of aorta (HCC)   . CAD (coronary artery disease)    a. 10/27 PCI with DES to RCA, diffuse nonobstructive disease, EF 50-55%  . Chronic pain   . Diverticulosis   . Dynamic left ventricular outflow obstruction    Mod focal basal LVH. EF 60-65% w/ dynamic LVOT obstruction (Peak Gradient 138 mmHg). No RWMA. Gr 2 DD - high filling pressures. Severe SAM. ~ PAP 35 mmHg.  Marland Kitchen Elevated transaminase level   . GERD (gastroesophageal reflux disease)   . Glaucoma   . Hiatal hernia   . Hypertension   . Lung nodules    Right  . MI (myocardial  infarction) (HCC) 01/09/2016  . Scoliosis   . UTI (urinary tract infection)    refractory    Patient Active Problem List   Diagnosis Date Noted  . CHF (congestive heart failure) (HCC) 09/23/2016  . Dyspnea 09/23/2016  . Dyslipidemia, goal LDL below 70 08/04/2016  . DOE (dyspnea on exertion) 06/24/2016  . Dynamic left ventricular outflow obstruction 04/24/2016  . Coronary artery disease involving native coronary artery of native heart with angina pectoris (HCC) 04/24/2016  . Hypotension 04/12/2016  . HCAP (healthcare-associated pneumonia) 04/12/2016  . History of MI (myocardial infarction) 04/12/2016  . Depression with anxiety 04/12/2016  . Chronic pain syndrome 04/12/2016  . Abdominal pain 03/07/2016  . Anemia 03/06/2016  . Diarrhea 03/06/2016  . Other fatigue 03/06/2016  . Elevated transaminase level 03/06/2016  . Chronic pain   . Diverticulitis 02/29/2016  . ST elevation myocardial infarction (STEMI) of inferior wall, subsequent episode of care (HCC) 01/09/2016  . Urinary tract infection without hematuria   . NSTEMI (non-ST elevated myocardial infarction) (HCC) 01/08/2016  . UTI (urinary tract infection) 05/27/2014  . PID (acute pelvic inflammatory disease) 05/27/2014  . Vaginal inflammation from pessary (HCC) 05/27/2014  . Leukocytosis 05/27/2014  . Nausea and vomiting 05/27/2014  . Hypertension   . Hiatal hernia   . Lung nodules   . Glaucoma   . GERD (gastroesophageal  reflux disease)     Past Surgical History:  Procedure Laterality Date  . ABDOMINAL HYSTERECTOMY    . CARDIAC CATHETERIZATION N/A 01/09/2016   Procedure: Left Heart Cath and Coronary Angiography;  Surgeon: Marykay Lex, MD;  Location: [facility] INVASIVE CV LAB: mRCA 100% thrombotic lesion (PCI). o-pLAD 40%, mLAD 30%, oD1 90%. OM1 (small) 70%.  . CARDIAC CATHETERIZATION N/A 01/09/2016   Procedure: Coronary Stent Intervention;  Surgeon: Marykay Lex, MD;  Location: [facility] INVASIVE CV LAB: mRCA PCI - STENT  PROMUS PREM MR 2.25X28 drug eluting stent  . EYE SURGERY    . FRACTURE SURGERY     Tibia/fibula of right leg  . TRANSTHORACIC ECHOCARDIOGRAM  01/11/2016   Mod focal basal LVH. EF 60-65% w/ dynamic LVOT obstruction (Peak Gradient 138 mmHg). No RWMA. Gr 2 DD - high filling pressures. Severe SAM. ~ PAP 35 mmHg.  Marland Kitchen TRANSTHORACIC ECHOCARDIOGRAM  07/08/2016   moderate basal hypertrophy and mild concentric hypertrophy. EF 60-65%. Dynamic outflow tract obstruction (peak gradient 17 mmHg). No aortic stenosis. GR 1 DD. Systolic anterior motion of mitral valve. No MR. Elevated PA pressures estimated at 59 mmHg.    OB History    No data available       Home Medications    Prior to Admission medications   Medication Sig Start Date End Date Taking? Authorizing Provider  atorvastatin (LIPITOR) 40 MG tablet Take 1 tablet by mouth daily. 03/16/16  Yes [provider]  bimatoprost (LUMIGAN) 0.01 % SOLN Place 1 drop into both eyes at bedtime.    Yes [provider]  celecoxib (CELEBREX) 200 MG capsule Take 200 mg by mouth 2 (two) times daily.    Yes [provider]  clopidogrel (PLAVIX) 75 MG tablet Take 1 tablet (75 mg total) by mouth daily. 01/14/16  Yes Laverda Page B, NP  docusate sodium (COLACE) 100 MG capsule Take 100 mg by mouth 2 (two) times daily.    Yes [provider]  dorzolamide-timolol (COSOPT) 22.3-6.8 MG/ML ophthalmic solution Place 1 drop into both eyes 2 (two) times daily.   Yes [provider]  esomeprazole (NEXIUM) 40 MG capsule Take 40 mg by mouth daily. 03/19/16  Yes [provider]  metoprolol tartrate (LOPRESSOR) 25 MG tablet Take 1 tablet (25 mg total) by mouth 2 (two) times daily. 08/02/16 10/31/16 Yes Marykay Lex, MD  Netarsudil Dimesylate (RHOPRESSA) 0.02 % SOLN Place 1 drop into both eyes at bedtime.   Yes [provider]  OLANZapine-FLUoxetine (SYMBYAX) 3-25 MG capsule Take 1 capsule by mouth every evening.   Yes  [provider]  oxyCODONE (OXY IR/ROXICODONE) 5 MG immediate release tablet Take 1 tablet (5 mg total) by mouth every 6 (six) hours as needed for severe pain (pain). Patient taking differently: Take 5 mg by mouth 3 (three) times daily.  03/09/16  Yes Calvert Cantor, MD  oxymorphone 10 MG PO T12A 12 hr tablet Take 1 tablet (10 mg total) by mouth every 12 (twelve) hours. 03/09/16  Yes Calvert Cantor, MD  pilocarpine (PILOCAR) 1 % ophthalmic solution Place 1 drop into both eyes 4 (four) times daily.   Yes [provider]  Polyethyl Glycol-Propyl Glycol (SYSTANE) 0.4-0.3 % SOLN Place 1 drop into both eyes as needed (for dry eyes).   Yes [provider]  pramipexole (MIRAPEX) 0.25 MG tablet Take 0.25-0.5 mg by mouth at bedtime.  03/19/16  Yes [provider]  senna (SENOKOT) 8.6 MG TABS tablet Take 2 tablets (17.2  mg total) by mouth at bedtime as needed for mild constipation. Patient taking differently: Take 1 tablet by mouth daily as needed for mild constipation.  03/04/16  Yes Johnson, Clanford L, MD  ALPRAZolam (XANAX) 0.25 MG tablet Take 1 tablet (0.25 mg total) by mouth 2 (two) times daily as needed for anxiety. Patient not taking: Reported on 09/22/2016 03/09/16   Calvert Cantor, MD  aspirin EC 81 MG EC tablet Take 1 tablet (81 mg total) by mouth daily. Patient not taking: Reported on 09/22/2016 01/14/16   Laverda Page B, NP  levofloxacin (LEVAQUIN) 500 MG tablet Take 1 tablet (500 mg total) by mouth every other day. Patient not taking: Reported on 09/22/2016 04/16/16   Dorothea Ogle, MD    Family History Family History  Problem Relation Age of Onset  . Cancer Brother        Stomach  . Heart disease Father        Died age 39  . Diabetes Mother   . Heart failure Mother        Died 30    Social History Social History  Substance Use Topics  . Smoking status: Former Games developer  . Smokeless tobacco: Never Used  . Alcohol use No     Allergies   Penicillins  and Phenergan [promethazine hcl]   Review of Systems Review of Systems  HENT: Positive for facial swelling (per daughter).   Respiratory: Positive for shortness of breath.   Cardiovascular: Positive for leg swelling. Negative for chest pain.  Gastrointestinal: Negative for abdominal pain and diarrhea.  Musculoskeletal: Positive for arthralgias.  Neurological: Negative for dizziness, weakness and headaches.  All other systems reviewed and are negative.    Physical Exam Updated Vital Signs BP (!) 120/93   Pulse 72   Temp 98.5 F (36.9 C)   Resp 15   SpO2 99%   Physical Exam  Constitutional: She is oriented to person, place, and time. She appears well-developed and well-nourished.  HENT:  Head: Normocephalic and atraumatic.  Right Ear: External ear normal.  Left Ear: External ear normal.  Nose: Nose normal.  Eyes: Right eye exhibits no discharge. Left eye exhibits no discharge.  Cardiovascular: Normal rate, regular rhythm and normal heart sounds.   Pulmonary/Chest: Effort normal and breath sounds normal.  Abdominal: Soft. There is no tenderness.  Musculoskeletal: She exhibits edema (BLE edema; L>R).  Mild tend over sacrum  Right thumb ecch no schapid tend nml ROM   Neurological: She is alert and oriented to person, place, and time.  CN 3-12 grossly intact. 5/5 strength in all 4 extremities Finger to nose limited on right due to shoulder problem Left finger to nose goes past finger on first attempt   Skin: Skin is warm and dry.  Nursing note and vitals reviewed.    ED Treatments / Results  DIAGNOSTIC STUDIES:  Oxygen Saturation is 98% on [place], normal by my interpretation.    COORDINATION OF CARE:  11:44 PM Discussed treatment plan with pt at bedside and pt agreed to plan.  Labs (all labs ordered are listed, but only abnormal results are displayed) Labs Reviewed  BASIC METABOLIC PANEL - Abnormal; Notable for the following:       Result Value   Glucose, Bld 120  (*)    All other components within normal limits  CBC - Abnormal; Notable for the following:    WBC 11.4 (*)    Hemoglobin 10.5 (*)    HCT 34.0 (*)  RDW 17.3 (*)    All other components within normal limits  BRAIN NATRIURETIC PEPTIDE - Abnormal; Notable for the following:    B Natriuretic Peptide 398.8 (*)    All other components within normal limits  URINALYSIS, ROUTINE W REFLEX MICROSCOPIC - Abnormal; Notable for the following:    Color, Urine STRAW (*)    Leukocytes, UA MODERATE (*)    Bacteria, UA RARE (*)    Squamous Epithelial / LPF 0-5 (*)    All other components within normal limits  I-STAT TROPOININ, ED    EKG  EKG Interpretation  Date/Time:  Wednesday September 22 2016 20:42:27 EDT Ventricular Rate:  76 PR Interval:  118 QRS Duration: 72 QT Interval:  394 QTC Calculation: 443 R Axis:   7 Text Interpretation:  Sinus rhythm with Premature supraventricular complexes Otherwise normal ECG Q waves no longer present compared to Jan 2018 Confirmed by Pricilla Loveless [phone]) on 09/22/2016 11:00:46 PM       Radiology Dg Chest 2 View  Result Date: 09/22/2016 CLINICAL DATA:  Shortness of breath. Bilateral foot swelling. Fall today. EXAM: CHEST  2 VIEW COMPARISON:  Chest radiograph 04/12/2016 FINDINGS: The cardiomediastinal contours are unchanged with aortic atherosclerosis and tortuosity of the thoracic aorta. Pulmonary vasculature is normal. No consolidation, pleural effusion, or pneumothorax. Moderate scoliosis of the thoracic spine. Post right shoulder arthroplasty. Chronic left shoulder deformity. Limited evaluation of the thoracic spine on lateral view due to bony under mineralization and scoliosis. IMPRESSION: 1. No acute abnormality. 2. Thoracic aortic atherosclerosis. 3. Unchanged scoliosis and chronic change of the left proximal humerus. Electronically Signed   By: Rubye Oaks M.D.   On: 09/22/2016 21:30   Dg Sacrum/coccyx  Result Date: 09/23/2016 CLINICAL DATA:   83 year old post fall striking tailbone with ataxia. EXAM: SACRUM AND COCCYX - 2+ VIEW COMPARISON:  CT 02/29/2016 FINDINGS: There is no evidence of fracture or other focal bone lesions. Degree of bony under mineralization limits assessment. Sacroiliac joints are congruent. Gluteal calcifications as seen on prior CT. IMPRESSION: No evidence of sacral or coccyx fracture allowing for limitations secondary to osteopenia/osteoporosis. Electronically Signed   By: Rubye Oaks M.D.   On: 09/23/2016 00:38   Ct Head Wo Contrast  Result Date: 09/23/2016 CLINICAL DATA:  Fall yesterday and 1 week ago.  Ataxia. EXAM: CT HEAD WITHOUT CONTRAST TECHNIQUE: Contiguous axial images were obtained from the base of the skull through the vertex without intravenous contrast. COMPARISON:  None. FINDINGS: Brain: Generalized atrophy, normal for age. Mild chronic small vessel ischemia. No intracranial hemorrhage, mass effect, or midline shift. No hydrocephalus. The basilar cisterns are patent. No evidence of territorial infarct. No intracranial fluid collection. Vascular: No hyperdense vessel. Skull: No skull fracture.  No focal lesion. Sinuses/Orbits: Paranasal sinuses and mastoid air cells are clear. The visualized orbits are unremarkable. Bilateral injured placement. Streak artifact from dental hardware. Other: None. IMPRESSION: 1.  No acute intracranial abnormality.  No skull fracture. 2. Age related atrophy with mild chronic small vessel ischemia. Electronically Signed   By: Rubye Oaks M.D.   On: 09/23/2016 01:06   Ct Angio Chest Pe W/cm &/or Wo Cm  Result Date: 09/23/2016 CLINICAL DATA:  Dyspnea and hypoxia with leg swelling EXAM: CT ANGIOGRAPHY CHEST WITH CONTRAST TECHNIQUE: Multidetector CT imaging of the chest was performed using the standard protocol during bolus administration of intravenous contrast. Multiplanar CT image reconstructions and MIPs were obtained to evaluate the vascular anatomy. CONTRAST:  80 mL  Isovue 370 intravenous COMPARISON:  Radiograph 09/22/2016 FINDINGS: Cardiovascular: Satisfactory opacification of the pulmonary arteries to the segmental level. No evidence of pulmonary embolism. Aortic atherosclerosis. Ectatic ascending aorta measuring up to 3.6 cm in diameter. Mild cardiomegaly. Coronary artery calcification. No significant pericardial effusion. Mediastinum/Nodes: Midline trachea. No thyroid mass. No significant mediastinal adenopathy. The esophagus is within normal limits. Lungs/Pleura: Mild emphysema. No focal consolidation or pleural effusion. Scattered bilateral pulmonary nodules, for example left upper lobe 3 mm pulmonary nodule, series 6, image number 33. , subpleural right lower lobe pulmonary nodule measuring 5 mm, series 6, image number 62. Upper Abdomen: No acute abnormality. Musculoskeletal: Moderate compression deformity of T8 with estimated 50% loss of height anteriorly. This is chronic compared to prior radiographs. Review of the MIP images confirms the above findings. IMPRESSION: 1. Negative for acute pulmonary embolus. 2. Mild emphysema. Multiple small pulmonary nodules. No follow-up needed if patient is low-risk (and has no known or suspected primary neoplasm). Non-contrast chest CT can be considered in 12 months if patient is high-risk. This recommendation follows the consensus statement: Guidelines for Management of Incidental Pulmonary Nodules Detected on CT Images: From the Fleischner Society 2017; Radiology 2017; 284:228-243. Aortic Atherosclerosis (ICD10-I70.0) and Emphysema (ICD10-J43.9). Electronically Signed   By: Jasmine Pang M.D.   On: 09/23/2016 01:19   Dg Hand Complete Right  Result Date: 09/22/2016 CLINICAL DATA:  Right hand pain and bruising after fall. EXAM: RIGHT HAND - COMPLETE 3+ VIEW COMPARISON:  None. FINDINGS: No acute fracture or dislocation. Osteoarthritis of the digits, with possible Gull wing deformity suggesting an inflammatory osteoarthritis  component. Small radial subluxation of the thumb distal phalanx at the interphalangeal joint appears chronic. Volar subluxation of the second through fourth proximal phalanges at the metacarpal phalangeal joint. Cystic changes in the ulna styloid. The bones are diffusely under mineralized. IMPRESSION: 1. No acute fracture or dislocation of the right hand. 2. 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 Volar subluxation of the digits at the metacarpal phalangeal joint suggests underlying inflammatory arthropathy. Electronically Signed   By: Rubye Oaks M.D.   On: 09/22/2016 21:32    Procedures Procedures (including critical care time)  Medications Ordered in ED Medications  furosemide (LASIX) 10 MG/ML injection (not administered)  oxyCODONE (Oxy IR/ROXICODONE) immediate release tablet 5 mg (5 mg Oral Given 09/23/16 0006)  iopamidol (ISOVUE-370) 76 % injection (100 mLs  Contrast Given 09/23/16 0042)     Initial Impression / Assessment and Plan / ED Course  I have reviewed the triage vital signs and the nursing notes.  Pertinent labs & imaging results that were available during my care of the patient were reviewed by me and considered in my medical decision making (see chart for details).     Unclear why the patient is hypoxic. She does have an elevated BNP and will be treated for degree of heart failure given the lower extremity edema. CT shows no pulmonary embolism. She also has some abnormality with finger to nose, she will need an MRI while in the hospital given possibility of a cerebellar cause. CT had benign. Could also be medication related as she is on multiple medications. Admit to hospitalist  Final Clinical Impressions(s) / ED Diagnoses   Final diagnoses:  Acute on chronic diastolic congestive heart failure (HCC)    New Prescriptions New Prescriptions   No medications on file   I personally performed the services described in this  documentation, which was scribed in my presence. The recorded information has been reviewed and is  accurate.     Pricilla Loveless, MD 09/23/16 [phone]

## 2016-09-23 ENCOUNTER — Emergency Department (HOSPITAL_COMMUNITY): Payer: Medicare Other

## 2016-09-23 ENCOUNTER — Encounter (HOSPITAL_COMMUNITY): Payer: Self-pay | Admitting: Internal Medicine

## 2016-09-23 DIAGNOSIS — R0600 Dyspnea, unspecified: Secondary | ICD-10-CM | POA: Diagnosis not present

## 2016-09-23 DIAGNOSIS — I25119 Atherosclerotic heart disease of native coronary artery with unspecified angina pectoris: Secondary | ICD-10-CM | POA: Diagnosis not present

## 2016-09-23 DIAGNOSIS — I5032 Chronic diastolic (congestive) heart failure: Secondary | ICD-10-CM | POA: Insufficient documentation

## 2016-09-23 DIAGNOSIS — I509 Heart failure, unspecified: Secondary | ICD-10-CM

## 2016-09-23 LAB — URINALYSIS, ROUTINE W REFLEX MICROSCOPIC
Bilirubin Urine: NEGATIVE
Glucose, UA: NEGATIVE mg/dL
Hgb urine dipstick: NEGATIVE
Ketones, ur: NEGATIVE mg/dL
Nitrite: NEGATIVE
Protein, ur: NEGATIVE mg/dL
SPECIFIC GRAVITY, URINE: 1.008 (ref 1.005–1.030)
pH: 6 (ref 5.0–8.0)

## 2016-09-23 LAB — COMPREHENSIVE METABOLIC PANEL
ALBUMIN: 3.4 g/dL — AB (ref 3.5–5.0)
ALK PHOS: 96 U/L (ref 38–126)
ALT: 20 U/L (ref 14–54)
AST: 29 U/L (ref 15–41)
Anion gap: 9 (ref 5–15)
BILIRUBIN TOTAL: 0.6 mg/dL (ref 0.3–1.2)
BUN: 14 mg/dL (ref 6–20)
CALCIUM: 9.2 mg/dL (ref 8.9–10.3)
CO2: 31 mmol/L (ref 22–32)
CREATININE: 0.98 mg/dL (ref 0.44–1.00)
Chloride: 98 mmol/L — ABNORMAL LOW (ref 101–111)
GFR calc Af Amer: 59 mL/min — ABNORMAL LOW (ref >60)
GFR calc non Af Amer: 51 mL/min — ABNORMAL LOW (ref >60)
GLUCOSE: 162 mg/dL — AB (ref 65–99)
Potassium: 4 mmol/L (ref 3.5–5.1)
Sodium: 138 mmol/L (ref 135–145)
TOTAL PROTEIN: 6.3 g/dL — AB (ref 6.5–8.1)

## 2016-09-23 LAB — CBC
HCT: 36.4 % (ref 36.0–46.0)
Hemoglobin: 11.1 g/dL — ABNORMAL LOW (ref 12.0–15.0)
MCH: 26.7 pg (ref 26.0–34.0)
MCHC: 30.5 g/dL (ref 30.0–36.0)
MCV: 87.5 fL (ref 78.0–100.0)
Platelets: 235 10*3/uL (ref 150–400)
RBC: 4.16 MIL/uL (ref 3.87–5.11)
RDW: 17.8 % — AB (ref 11.5–15.5)
WBC: 9.2 10*3/uL (ref 4.0–10.5)

## 2016-09-23 LAB — MRSA PCR SCREENING: MRSA by PCR: NEGATIVE

## 2016-09-23 LAB — TSH: TSH: 1.897 u[IU]/mL (ref 0.350–4.500)

## 2016-09-23 LAB — TROPONIN I: Troponin I: <0.03 ng/mL (ref <0.03)

## 2016-09-23 LAB — BRAIN NATRIURETIC PEPTIDE: B Natriuretic Peptide: 398.8 pg/mL — ABNORMAL HIGH (ref 0.0–100.0)

## 2016-09-23 MED ORDER — PILOCARPINE HCL 1 % OP SOLN
1.0000 [drp] | Freq: Four times a day (QID) | OPHTHALMIC | Status: DC
  Administered 2016-09-23 – 2016-09-28 (×20): 1 [drp] via OPHTHALMIC
  Filled 2016-09-23: qty 15

## 2016-09-23 MED ORDER — NETARSUDIL DIMESYLATE 0.02 % OP SOLN
1.0000 [drp] | Freq: Every day | OPHTHALMIC | Status: DC
  Administered 2016-09-25 – 2016-09-27 (×3): 1 [drp] via OPHTHALMIC

## 2016-09-23 MED ORDER — SENNA 8.6 MG PO TABS
1.0000 | ORAL_TABLET | Freq: Every day | ORAL | Status: DC | PRN
  Filled 2016-09-23: qty 1

## 2016-09-23 MED ORDER — FUROSEMIDE 10 MG/ML IJ SOLN
20.0000 mg | Freq: Every day | INTRAMUSCULAR | Status: DC
  Administered 2016-09-23 – 2016-09-25 (×3): 20 mg via INTRAVENOUS
  Filled 2016-09-23 (×4): qty 2

## 2016-09-23 MED ORDER — DOCUSATE SODIUM 100 MG PO CAPS
100.0000 mg | ORAL_CAPSULE | Freq: Two times a day (BID) | ORAL | Status: DC
  Administered 2016-09-23 – 2016-09-28 (×11): 100 mg via ORAL
  Filled 2016-09-23 (×12): qty 1

## 2016-09-23 MED ORDER — SODIUM CHLORIDE 0.9 % IV SOLN: 250.0000 mL | INTRAVENOUS | Status: DC | PRN

## 2016-09-23 MED ORDER — SODIUM CHLORIDE 0.9% FLUSH
3.0000 mL | Freq: Two times a day (BID) | INTRAVENOUS | Status: DC
  Administered 2016-09-23 – 2016-09-27 (×5): 3 mL via INTRAVENOUS

## 2016-09-23 MED ORDER — PRAMIPEXOLE DIHYDROCHLORIDE 0.25 MG PO TABS
0.2500 mg | ORAL_TABLET | Freq: Every day | ORAL | Status: DC
  Administered 2016-09-23 – 2016-09-24 (×2): 0.5 mg via ORAL
  Administered 2016-09-25: 0.25 mg via ORAL
  Administered 2016-09-26 – 2016-09-27 (×2): 0.5 mg via ORAL
  Filled 2016-09-23 (×6): qty 2

## 2016-09-23 MED ORDER — POLYETHYL GLYCOL-PROPYL GLYCOL 0.4-0.3 % OP SOLN: 1.0000 [drp] | OPHTHALMIC | Status: DC | PRN

## 2016-09-23 MED ORDER — CLOPIDOGREL BISULFATE 75 MG PO TABS
75.0000 mg | ORAL_TABLET | Freq: Every day | ORAL | Status: DC
  Administered 2016-09-23 – 2016-09-28 (×6): 75 mg via ORAL
  Filled 2016-09-23 (×7): qty 1

## 2016-09-23 MED ORDER — OXYCODONE HCL 5 MG PO TABS
5.0000 mg | ORAL_TABLET | Freq: Three times a day (TID) | ORAL | Status: DC
  Administered 2016-09-23 – 2016-09-25 (×6): 5 mg via ORAL
  Filled 2016-09-23 (×7): qty 1

## 2016-09-23 MED ORDER — POLYVINYL ALCOHOL 1.4 % OP SOLN
1.0000 [drp] | OPHTHALMIC | Status: DC | PRN
  Filled 2016-09-23: qty 15

## 2016-09-23 MED ORDER — SODIUM CHLORIDE 0.9% FLUSH: 3.0000 mL | INTRAVENOUS | Status: DC | PRN

## 2016-09-23 MED ORDER — METOPROLOL TARTRATE 25 MG PO TABS
25.0000 mg | ORAL_TABLET | Freq: Two times a day (BID) | ORAL | Status: DC
  Administered 2016-09-23: 25 mg via ORAL
  Filled 2016-09-23: qty 1

## 2016-09-23 MED ORDER — ENOXAPARIN SODIUM 40 MG/0.4ML ~~LOC~~ SOLN
40.0000 mg | SUBCUTANEOUS | Status: DC
Start: 2016-09-23 — End: 2016-09-28
  Administered 2016-09-23 – 2016-09-28 (×6): 40 mg via SUBCUTANEOUS
  Filled 2016-09-23 (×7): qty 0.4

## 2016-09-23 MED ORDER — DORZOLAMIDE HCL-TIMOLOL MAL 2-0.5 % OP SOLN
1.0000 [drp] | Freq: Two times a day (BID) | OPHTHALMIC | Status: DC
  Administered 2016-09-23 – 2016-09-28 (×11): 1 [drp] via OPHTHALMIC
  Filled 2016-09-23 (×2): qty 10

## 2016-09-23 MED ORDER — MORPHINE SULFATE ER 15 MG PO TBCR
30.0000 mg | EXTENDED_RELEASE_TABLET | Freq: Two times a day (BID) | ORAL | Status: DC
  Administered 2016-09-23 – 2016-09-25 (×5): 30 mg via ORAL
  Filled 2016-09-23 (×5): qty 2
  Filled 2016-09-23: qty 1
  Filled 2016-09-23: qty 2

## 2016-09-23 MED ORDER — SODIUM CHLORIDE 0.9 % IV BOLUS (SEPSIS)
250.0000 mL | Freq: Once | INTRAVENOUS | Status: AC
  Administered 2016-09-23: 250 mL via INTRAVENOUS

## 2016-09-23 MED ORDER — IOPAMIDOL (ISOVUE-370) INJECTION 76%
INTRAVENOUS | Status: AC
  Administered 2016-09-23: 100 mL
  Filled 2016-09-23: qty 100

## 2016-09-23 MED ORDER — ATORVASTATIN CALCIUM 40 MG PO TABS
40.0000 mg | ORAL_TABLET | Freq: Every day | ORAL | Status: DC
  Administered 2016-09-23 – 2016-09-28 (×6): 40 mg via ORAL
  Filled 2016-09-23 (×7): qty 1

## 2016-09-23 MED ORDER — LATANOPROST 0.005 % OP SOLN
1.0000 [drp] | Freq: Every day | OPHTHALMIC | Status: DC
  Administered 2016-09-23 – 2016-09-27 (×5): 1 [drp] via OPHTHALMIC
  Filled 2016-09-23: qty 2.5

## 2016-09-23 MED ORDER — FUROSEMIDE 10 MG/ML IJ SOLN
INTRAMUSCULAR | Status: AC
  Filled 2016-09-23: qty 4

## 2016-09-23 MED ORDER — SODIUM CHLORIDE 0.9% FLUSH
3.0000 mL | Freq: Two times a day (BID) | INTRAVENOUS | Status: DC
  Administered 2016-09-23 – 2016-09-28 (×8): 3 mL via INTRAVENOUS

## 2016-09-23 MED ORDER — PANTOPRAZOLE SODIUM 40 MG PO TBEC
40.0000 mg | DELAYED_RELEASE_TABLET | Freq: Every day | ORAL | Status: DC
  Administered 2016-09-23 – 2016-09-28 (×6): 40 mg via ORAL
  Filled 2016-09-23 (×7): qty 1

## 2016-09-23 MED ORDER — OLANZAPINE-FLUOXETINE HCL 3-25 MG PO CAPS
1.0000 | ORAL_CAPSULE | Freq: Every evening | ORAL | Status: DC
  Administered 2016-09-24 – 2016-09-27 (×4): 1 via ORAL
  Filled 2016-09-23 (×6): qty 1

## 2016-09-23 NOTE — H&P (Signed)
TRH H&P   Patient Demographics:    Alexis Yates, is a 83 y.o. female  MRN: [id number]   DOB - July 08, 1927  Admit Date - 09/22/2016  Outpatient Primary MD for the patient is Marden Noble, MD  Referring MD/NP/PA: Pricilla Loveless Outpatient Specialists:  Y[facility] Decamp  Patient coming from:   Chief Complaint  Patient presents with  . Fall  . Shortness of Breath      HPI:    Alexis Yates  is a 83 y.o. female,  CHF (60-65), MI 01/07/2016, s/p stent apparently feel backwards and presented for fall to ED.   Pt found to have slightly low pox in ED.  Pt noted dyspnea x 4 days and increasing edema as well as 10 lbs weight gain over the past 3 months.  Pt denies fever, chills, cp, palp, n/v, diarrhea, brbpr, black stool.    In ED, CT brain negative, CTA chest=> negative for PE,  Mild emphysema, no focal consolidation or pleural effusion.  Multiple small pulmonary nodules, will need follow up.  Xray no evidence of sacral or coccy fracture.  BNP=>398.8, trop -I  0.01   Pt will be admitted for hypoxia, ?CHF.    Review of systems:    In addition to the HPI above, No Fever-chills, No Headache, No changes with Vision or hearing, No problems swallowing food or Liquids, No Chest pain, Cough  No Abdominal pain, No Nausea or Vommitting, Bowel movements are regular, No Blood in stool or Urine, No dysuria, No new skin rashes or bruises, No new joints pains-aches,  No new weakness, tingling, numbness in any extremity, No recent weight gain or loss, No polyuria, polydypsia or polyphagia, No significant Mental Stressors.  A full 10 point Review of Systems was done, except as stated above, all other Review of Systems were negative.   With Past History of the following :    Past Medical History:  Diagnosis Date  . Arthritis   . Atherosclerosis of aorta (HCC)   . CAD (coronary artery  disease)    a. 10/27 PCI with DES to RCA, diffuse nonobstructive disease, EF 50-55%  . Chronic pain   . Diverticulosis   . Dynamic left ventricular outflow obstruction    Mod focal basal LVH. EF 60-65% w/ dynamic LVOT obstruction (Peak Gradient 138 mmHg). No RWMA. Gr 2 DD - high filling pressures. Severe SAM. ~ PAP 35 mmHg.  Marland Kitchen Elevated transaminase level   . GERD (gastroesophageal reflux disease)   . Glaucoma   . Hiatal hernia   . Hypertension   . Lung nodules    Right  . MI (myocardial infarction) (HCC) 01/09/2016  . Scoliosis   . UTI (urinary tract infection)    refractory      Past Surgical History:  Procedure Laterality Date  . ABDOMINAL HYSTERECTOMY    . CARDIAC CATHETERIZATION N/A 01/09/2016   Procedure: Left  Heart Cath and Coronary Angiography;  Surgeon: Marykay Lex, MD;  Location: [facility] INVASIVE CV LAB: mRCA 100% thrombotic lesion (PCI). o-pLAD 40%, mLAD 30%, oD1 90%. OM1 (small) 70%.  . CARDIAC CATHETERIZATION N/A 01/09/2016   Procedure: Coronary Stent Intervention;  Surgeon: Marykay Lex, MD;  Location: [facility] INVASIVE CV LAB: mRCA PCI - STENT PROMUS PREM MR 2.25X28 drug eluting stent  . EYE SURGERY    . FRACTURE SURGERY     Tibia/fibula of right leg  . TRANSTHORACIC ECHOCARDIOGRAM  01/11/2016   Mod focal basal LVH. EF 60-65% w/ dynamic LVOT obstruction (Peak Gradient 138 mmHg). No RWMA. Gr 2 DD - high filling pressures. Severe SAM. ~ PAP 35 mmHg.  Marland Kitchen TRANSTHORACIC ECHOCARDIOGRAM  07/08/2016   moderate basal hypertrophy and mild concentric hypertrophy. EF 60-65%. Dynamic outflow tract obstruction (peak gradient 17 mmHg). No aortic stenosis. GR 1 DD. Systolic anterior motion of mitral valve. No MR. Elevated PA pressures estimated at 59 mmHg.      Social History:     Social History  Substance Use Topics  . Smoking status: Former Games developer  . Smokeless tobacco: Never Used  . Alcohol use No     Lives - at home  Mobility - walks by self   Family History :       Family History  Problem Relation Age of Onset  . Cancer Brother        Stomach  . Heart disease Father        Died age 78  . Diabetes Mother   . Heart failure Mother        Died 12      Home Medications:   Prior to Admission medications   Medication Sig Start Date End Date Taking? Authorizing Provider  atorvastatin (LIPITOR) 40 MG tablet Take 1 tablet by mouth daily. 03/16/16  Yes [provider]  bimatoprost (LUMIGAN) 0.01 % SOLN Place 1 drop into both eyes at bedtime.    Yes [provider]  celecoxib (CELEBREX) 200 MG capsule Take 200 mg by mouth 2 (two) times daily.    Yes [provider]  clopidogrel (PLAVIX) 75 MG tablet Take 1 tablet (75 mg total) by mouth daily. 01/14/16  Yes Laverda Page B, NP  docusate sodium (COLACE) 100 MG capsule Take 100 mg by mouth 2 (two) times daily.    Yes [provider]  dorzolamide-timolol (COSOPT) 22.3-6.8 MG/ML ophthalmic solution Place 1 drop into both eyes 2 (two) times daily.   Yes [provider]  esomeprazole (NEXIUM) 40 MG capsule Take 40 mg by mouth daily. 03/19/16  Yes [provider]  metoprolol tartrate (LOPRESSOR) 25 MG tablet Take 1 tablet (25 mg total) by mouth 2 (two) times daily. 08/02/16 10/31/16 Yes Marykay Lex, MD  Netarsudil Dimesylate (RHOPRESSA) 0.02 % SOLN Place 1 drop into both eyes at bedtime.   Yes [provider]  OLANZapine-FLUoxetine (SYMBYAX) 3-25 MG capsule Take 1 capsule by mouth every evening.   Yes [provider]  oxyCODONE (OXY IR/ROXICODONE) 5 MG immediate release tablet Take 1 tablet (5 mg total) by mouth every 6 (six) hours as needed for severe pain (pain). Patient taking differently: Take 5 mg by mouth 3 (three) times daily.  03/09/16  Yes Calvert Cantor, MD  oxymorphone 10 MG PO T12A 12 hr tablet Take 1 tablet (10 mg total) by mouth every 12 (twelve) hours. 03/09/16  Yes Calvert Cantor, MD  pilocarpine (PILOCAR) 1 % ophthalmic solution  Place 1  drop into both eyes 4 (four) times daily.   Yes [provider]  Polyethyl Glycol-Propyl Glycol (SYSTANE) 0.4-0.3 % SOLN Place 1 drop into both eyes as needed (for dry eyes).   Yes [provider]  pramipexole (MIRAPEX) 0.25 MG tablet Take 0.25-0.5 mg by mouth at bedtime.  03/19/16  Yes [provider]  senna (SENOKOT) 8.6 MG TABS tablet Take 2 tablets (17.2 mg total) by mouth at bedtime as needed for mild constipation. Patient taking differently: Take 1 tablet by mouth daily as needed for mild constipation.  03/04/16  Yes Johnson, Clanford L, MD  ALPRAZolam (XANAX) 0.25 MG tablet Take 1 tablet (0.25 mg total) by mouth 2 (two) times daily as needed for anxiety. Patient not taking: Reported on 09/22/2016 03/09/16   Calvert Cantor, MD  aspirin EC 81 MG EC tablet Take 1 tablet (81 mg total) by mouth daily. Patient not taking: Reported on 09/22/2016 01/14/16   Laverda Page B, NP  levofloxacin (LEVAQUIN) 500 MG tablet Take 1 tablet (500 mg total) by mouth every other day. Patient not taking: Reported on 09/22/2016 04/16/16   Dorothea Ogle, MD     Allergies:     Allergies  Allergen Reactions  . Penicillins Anaphylaxis    Has patient had a PCN reaction causing immediate rash, facial/tongue/throat swelling, SOB or lightheadedness with hypotension: Yes Has patient had a PCN reaction causing severe rash involving mucus membranes or skin necrosis: No Has patient had a PCN reaction that required hospitalization Yes Has patient had a PCN reaction occurring within the last 10 years: No If all of the above answers are "NO", then may proceed with Cephalosporin use.   Marland Kitchen Phenergan [Promethazine Hcl] Other (See Comments)    Restless legs     Physical Exam:   Vitals  Blood pressure (!) 148/74, pulse 65, temperature 98.5 F (36.9 C), resp. rate (!) 21, SpO2 99 %.   1. General  lying in bed in NAD,    2. Normal affect and insight, Not Suicidal or Homicidal, Awake  Alert, Oriented X 3.  3. No F.N deficits, ALL C.Nerves Intact, Strength 5/5 all 4 extremities, Sensation intact all 4 extremities, Plantars down going.  4. Ears and Eyes appear Normal, Conjunctivae clear, PERRLA. Moist Oral Mucosa.  5. Supple Neck, No JVD, No cervical lymphadenopathy appriciated, No Carotid Bruits.  6. Symmetrical Chest wall movement, Good air movement bilaterally,  Slight crackle bilateral base, no wheezing  7. RRR, No Gallops, Rubs or Murmurs, No Parasternal Heave.  8. Positive Bowel Sounds, Abdomen Soft, No tenderness, No organomegaly appriciated,No rebound -guarding or rigidity.  9.  No Cyanosis, Normal Skin Turgor, No Skin Rash or Bruise.  10. Good muscle tone,  joints appear normal , no effusions, Normal ROM.  11. No Palpable Lymph Nodes in Neck or Axillae     Data Review:    CBC  Recent Labs Lab 09/22/16 2050  WBC 11.4*  HGB 10.5*  HCT 34.0*  PLT 236  MCV 85.9  MCH 26.5  MCHC 30.9  RDW 17.3*   ------------------------------------------------------------------------------------------------------------------  Chemistries   Recent Labs Lab 09/22/16 2050  NA 135  K 4.4  CL 102  CO2 26  GLUCOSE 120*  BUN 16  CREATININE 0.85  CALCIUM 9.0   ------------------------------------------------------------------------------------------------------------------ CrCl cannot be calculated (Unknown ideal weight.). ------------------------------------------------------------------------------------------------------------------ No results for input(s): TSH, T4TOTAL, T3FREE, THYROIDAB in the last 72 hours.  Invalid input(s): FREET3  Coagulation profile No results for input(s): INR, PROTIME in the last  168 hours. ------------------------------------------------------------------------------------------------------------------- No results for input(s): DDIMER in the last 72  hours. -------------------------------------------------------------------------------------------------------------------  Cardiac Enzymes No results for input(s): CKMB, TROPONINI, MYOGLOBIN in the last 168 hours.  Invalid input(s): CK ------------------------------------------------------------------------------------------------------------------    Component Value Date/Time   BNP 398.8 (H) 09/22/2016 2050     ---------------------------------------------------------------------------------------------------------------  Urinalysis    Component Value Date/Time   COLORURINE STRAW (A) 09/23/2016 0055   APPEARANCEUR CLEAR 09/23/2016 0055   LABSPEC 1.008 09/23/2016 0055   PHURINE 6.0 09/23/2016 0055   GLUCOSEU NEGATIVE 09/23/2016 0055   HGBUR NEGATIVE 09/23/2016 0055   BILIRUBINUR NEGATIVE 09/23/2016 0055   KETONESUR NEGATIVE 09/23/2016 0055   PROTEINUR NEGATIVE 09/23/2016 0055   UROBILINOGEN 0.2 05/27/2014 1122   NITRITE NEGATIVE 09/23/2016 0055   LEUKOCYTESUR MODERATE (A) 09/23/2016 0055    ----------------------------------------------------------------------------------------------------------------   Imaging Results:    Dg Chest 2 View  Result Date: 09/22/2016 CLINICAL DATA:  Shortness of breath. Bilateral foot swelling. Fall today. EXAM: CHEST  2 VIEW COMPARISON:  Chest radiograph 04/12/2016 FINDINGS: The cardiomediastinal contours are unchanged with aortic atherosclerosis and tortuosity of the thoracic aorta. Pulmonary vasculature is normal. No consolidation, pleural effusion, or pneumothorax. Moderate scoliosis of the thoracic spine. Post right shoulder arthroplasty. Chronic left shoulder deformity. Limited evaluation of the thoracic spine on lateral view due to bony under mineralization and scoliosis. IMPRESSION: 1. No acute abnormality. 2. Thoracic aortic atherosclerosis. 3. Unchanged scoliosis and chronic change of the left proximal humerus. Electronically Signed    By: Rubye Oaks M.D.   On: 09/22/2016 21:30   Dg Sacrum/coccyx  Result Date: 09/23/2016 CLINICAL DATA:  83 year old post fall striking tailbone with ataxia. EXAM: SACRUM AND COCCYX - 2+ VIEW COMPARISON:  CT 02/29/2016 FINDINGS: There is no evidence of fracture or other focal bone lesions. Degree of bony under mineralization limits assessment. Sacroiliac joints are congruent. Gluteal calcifications as seen on prior CT. IMPRESSION: No evidence of sacral or coccyx fracture allowing for limitations secondary to osteopenia/osteoporosis. Electronically Signed   By: Rubye Oaks M.D.   On: 09/23/2016 00:38   Ct Head Wo Contrast  Result Date: 09/23/2016 CLINICAL DATA:  Fall yesterday and 1 week ago.  Ataxia. EXAM: CT HEAD WITHOUT CONTRAST TECHNIQUE: Contiguous axial images were obtained from the base of the skull through the vertex without intravenous contrast. COMPARISON:  None. FINDINGS: Brain: Generalized atrophy, normal for age. Mild chronic small vessel ischemia. No intracranial hemorrhage, mass effect, or midline shift. No hydrocephalus. The basilar cisterns are patent. No evidence of territorial infarct. No intracranial fluid collection. Vascular: No hyperdense vessel. Skull: No skull fracture.  No focal lesion. Sinuses/Orbits: Paranasal sinuses and mastoid air cells are clear. The visualized orbits are unremarkable. Bilateral injured placement. Streak artifact from dental hardware. Other: None. IMPRESSION: 1.  No acute intracranial abnormality.  No skull fracture. 2. Age related atrophy with mild chronic small vessel ischemia. Electronically Signed   By: Rubye Oaks M.D.   On: 09/23/2016 01:06   Ct Angio Chest Pe W/cm &/or Wo Cm  Result Date: 09/23/2016 CLINICAL DATA:  Dyspnea and hypoxia with leg swelling EXAM: CT ANGIOGRAPHY CHEST WITH CONTRAST TECHNIQUE: Multidetector CT imaging of the chest was performed using the standard protocol during bolus administration of intravenous contrast.  Multiplanar CT image reconstructions and MIPs were obtained to evaluate the vascular anatomy. CONTRAST:  80 mL Isovue 370 intravenous COMPARISON:  Radiograph 09/22/2016 FINDINGS: Cardiovascular: Satisfactory opacification of the pulmonary arteries to the segmental level. No evidence of pulmonary embolism. Aortic atherosclerosis. Ectatic ascending  aorta measuring up to 3.6 cm in diameter. Mild cardiomegaly. Coronary artery calcification. No significant pericardial effusion. Mediastinum/Nodes: Midline trachea. No thyroid mass. No significant mediastinal adenopathy. The esophagus is within normal limits. Lungs/Pleura: Mild emphysema. No focal consolidation or pleural effusion. Scattered bilateral pulmonary nodules, for example left upper lobe 3 mm pulmonary nodule, series 6, image number 33. , subpleural right lower lobe pulmonary nodule measuring 5 mm, series 6, image number 62. Upper Abdomen: No acute abnormality. Musculoskeletal: Moderate compression deformity of T8 with estimated 50% loss of height anteriorly. This is chronic compared to prior radiographs. Review of the MIP images confirms the above findings. IMPRESSION: 1. Negative for acute pulmonary embolus. 2. Mild emphysema. Multiple small pulmonary nodules. No follow-up needed if patient is low-risk (and has no known or suspected primary neoplasm). Non-contrast chest CT can be considered in 12 months if patient is high-risk. This recommendation follows the consensus statement: Guidelines for Management of Incidental Pulmonary Nodules Detected on CT Images: From the Fleischner Society 2017; Radiology 2017; 284:228-243. Aortic Atherosclerosis (ICD10-I70.0) and Emphysema (ICD10-J43.9). Electronically Signed   By: Jasmine Pang M.D.   On: 09/23/2016 01:19   Dg Hand Complete Right  Result Date: 09/22/2016 CLINICAL DATA:  Right hand pain and bruising after fall. EXAM: RIGHT HAND - COMPLETE 3+ VIEW COMPARISON:  None. FINDINGS: No acute fracture or dislocation.  Osteoarthritis of the digits, with possible Gull wing deformity suggesting an inflammatory osteoarthritis component. Small radial subluxation of the thumb distal phalanx at the interphalangeal joint appears chronic. Volar subluxation of the second through fourth proximal phalanges at the metacarpal phalangeal joint. Cystic changes in the ulna styloid. The bones are diffusely under mineralized. IMPRESSION: 1. No acute fracture or dislocation of the right hand. 2. 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 Volar subluxation of the digits at the metacarpal phalangeal joint suggests underlying inflammatory arthropathy. Electronically Signed   By: Rubye Oaks M.D.   On: 09/22/2016 21:32      Assessment & Plan:    Active Problems:   Coronary artery disease involving native coronary artery of native heart with angina pectoris (HCC)   DOE (dyspnea on exertion)   CHF (congestive heart failure) (HCC)    Dyspnea , Hypoxia, ? Pain medication, ? Mild CHF ? Mild Copd Tele Trop I q6h x3 Check tsh Needs outpatient PFT Check cardiac echo Lasix 20mg  iv qday  Hyperglycemia Check hga1c  CAD Cont plavix, lipitor, metoprolol  Anemia Check cbc in am If worsening anemia, consider ferritin, iron, tibc, folate, b12, spep, upep  Multiple pulmonary nodules Pt aware and will need outpatient follow up  Weight gain Might be due to olanzapine, may need to discuss this w pcp   DVT Prophylaxis Lovenox, SCDs   AM Labs Ordered, also please review Full Orders  Family Communication: Admission, patients condition and plan of care including tests being ordered have been discussed with the patient  who indicate understanding and agree with the plan and Code Status.  Code Status FULL CODE  Likely DC to  home  Condition GUARDED    Consults called: none  Admission status: observation  Time spent in minutes : 45 minutes   Pearson Grippe M.D on 09/23/2016 at 3:01  AM  Between 7am to 7pm - Pager - [phone]  After 7pm go to [email or web address] - password [facility]  [facility] - Office  [phone]

## 2016-09-23 NOTE — ED Notes (Signed)
Pt took home dose of Oxymorphone 10mg  extended release.

## 2016-09-23 NOTE — Progress Notes (Signed)
Patient seen and examined at bedside, patient admitted after midnight, please see earlier detailed admission note by No admitting provider for patient encounter.. Briefly, patient presented with CHF exacerbation. Currently being diuresed. Troponin cycled.   Jacquelin Hawking, MD [facility] 09/23/2016, 11:33 AM Pager: [phone]

## 2016-09-23 NOTE — ED Notes (Addendum)
Pt BP 79/57, notified admitting MD. Will give bolus NS. Continue to monitor. Pt denies feeling poorly, pt alert and oriented. Denies dizziness.

## 2016-09-23 NOTE — ED Notes (Signed)
Patient transported to X-ray 

## 2016-09-23 NOTE — ED Notes (Signed)
Took patient to the bathroom patient was assisted back into the bed patient is resting

## 2016-09-24 ENCOUNTER — Other Ambulatory Visit (HOSPITAL_COMMUNITY): Payer: Medicare Other

## 2016-09-24 ENCOUNTER — Observation Stay (HOSPITAL_COMMUNITY): Payer: Medicare Other

## 2016-09-24 DIAGNOSIS — R0609 Other forms of dyspnea: Secondary | ICD-10-CM | POA: Diagnosis not present

## 2016-09-24 DIAGNOSIS — Z8249 Family history of ischemic heart disease and other diseases of the circulatory system: Secondary | ICD-10-CM | POA: Diagnosis not present

## 2016-09-24 DIAGNOSIS — G894 Chronic pain syndrome: Secondary | ICD-10-CM | POA: Diagnosis present

## 2016-09-24 DIAGNOSIS — Z833 Family history of diabetes mellitus: Secondary | ICD-10-CM | POA: Diagnosis not present

## 2016-09-24 DIAGNOSIS — I5033 Acute on chronic diastolic (congestive) heart failure: Secondary | ICD-10-CM | POA: Diagnosis present

## 2016-09-24 DIAGNOSIS — Z7982 Long term (current) use of aspirin: Secondary | ICD-10-CM | POA: Diagnosis not present

## 2016-09-24 DIAGNOSIS — Z881 Allergy status to other antibiotic agents status: Secondary | ICD-10-CM | POA: Diagnosis not present

## 2016-09-24 DIAGNOSIS — M419 Scoliosis, unspecified: Secondary | ICD-10-CM | POA: Diagnosis present

## 2016-09-24 DIAGNOSIS — Z87891 Personal history of nicotine dependence: Secondary | ICD-10-CM | POA: Diagnosis not present

## 2016-09-24 DIAGNOSIS — E785 Hyperlipidemia, unspecified: Secondary | ICD-10-CM | POA: Diagnosis present

## 2016-09-24 DIAGNOSIS — D649 Anemia, unspecified: Secondary | ICD-10-CM | POA: Diagnosis not present

## 2016-09-24 DIAGNOSIS — K219 Gastro-esophageal reflux disease without esophagitis: Secondary | ICD-10-CM | POA: Diagnosis present

## 2016-09-24 DIAGNOSIS — R7303 Prediabetes: Secondary | ICD-10-CM

## 2016-09-24 DIAGNOSIS — Z7902 Long term (current) use of antithrombotics/antiplatelets: Secondary | ICD-10-CM | POA: Diagnosis not present

## 2016-09-24 DIAGNOSIS — J9621 Acute and chronic respiratory failure with hypoxia: Secondary | ICD-10-CM | POA: Diagnosis not present

## 2016-09-24 DIAGNOSIS — I110 Hypertensive heart disease with heart failure: Secondary | ICD-10-CM | POA: Diagnosis present

## 2016-09-24 DIAGNOSIS — I25119 Atherosclerotic heart disease of native coronary artery with unspecified angina pectoris: Secondary | ICD-10-CM | POA: Diagnosis present

## 2016-09-24 DIAGNOSIS — E874 Mixed disorder of acid-base balance: Secondary | ICD-10-CM | POA: Diagnosis present

## 2016-09-24 DIAGNOSIS — K449 Diaphragmatic hernia without obstruction or gangrene: Secondary | ICD-10-CM | POA: Diagnosis present

## 2016-09-24 DIAGNOSIS — Z90710 Acquired absence of both cervix and uterus: Secondary | ICD-10-CM | POA: Diagnosis not present

## 2016-09-24 DIAGNOSIS — J9602 Acute respiratory failure with hypercapnia: Secondary | ICD-10-CM | POA: Diagnosis present

## 2016-09-24 DIAGNOSIS — J9601 Acute respiratory failure with hypoxia: Secondary | ICD-10-CM | POA: Diagnosis present

## 2016-09-24 DIAGNOSIS — K5790 Diverticulosis of intestine, part unspecified, without perforation or abscess without bleeding: Secondary | ICD-10-CM | POA: Diagnosis present

## 2016-09-24 DIAGNOSIS — I252 Old myocardial infarction: Secondary | ICD-10-CM | POA: Diagnosis not present

## 2016-09-24 DIAGNOSIS — H409 Unspecified glaucoma: Secondary | ICD-10-CM | POA: Diagnosis present

## 2016-09-24 DIAGNOSIS — Z809 Family history of malignant neoplasm, unspecified: Secondary | ICD-10-CM | POA: Diagnosis not present

## 2016-09-24 DIAGNOSIS — G8929 Other chronic pain: Secondary | ICD-10-CM | POA: Diagnosis present

## 2016-09-24 DIAGNOSIS — I700 Atherosclerosis of aorta: Secondary | ICD-10-CM | POA: Diagnosis present

## 2016-09-24 LAB — HEMOGLOBIN A1C
Hgb A1c MFr Bld: 6.4 % — ABNORMAL HIGH (ref 4.8–5.6)
Mean Plasma Glucose: 137 mg/dL

## 2016-09-24 LAB — TROPONIN I

## 2016-09-24 MED ORDER — ONDANSETRON HCL 4 MG/2ML IJ SOLN
4.0000 mg | Freq: Three times a day (TID) | INTRAMUSCULAR | Status: DC | PRN
  Administered 2016-09-24 – 2016-09-27 (×2): 4 mg via INTRAVENOUS
  Filled 2016-09-24 (×2): qty 2

## 2016-09-24 NOTE — Care Management Obs Status (Signed)
MEDICARE OBSERVATION STATUS NOTIFICATION   Patient Details  Name: Alexis Yates MRN: [id number] Date of Birth: March 14, 1928   Medicare Observation Status Notification Given:  Yes    Gala Lewandowsky, RN 09/24/2016, 12:07 PM

## 2016-09-24 NOTE — Progress Notes (Addendum)
PROGRESS NOTE    Alexis Yates  NWG:[id number] DOB: 1927-08-01 DOA: 09/22/2016 PCP: Marden Noble, MD   Brief Narrative: Alexis Yates is a 83 y.o. female with a history of a solid CHF, myocardial infarction status post stents. She presented with dyspnea with concern for acute heart failure exacerbation. Lasix started.   Assessment & Plan:   Active Problems:   Coronary artery disease involving native coronary artery of native heart with angina pectoris (HCC)   DOE (dyspnea on exertion)   CHF (congestive heart failure) (HCC)   Dyspnea   Acute heart failure (HCC)   Dyspnea Acute respiratory failure with hypoxia Appears to be secondary to a CHF exacerbation with elevated BNP, dyspnea on exertion and crackles on exam. Patient failed to wean off of oxygen this morning with desaturation to below 88% on room air. [place] restarted at 2L O2. -continue IV Lasix -repeat chest x-ray -echocardiogram pending -daily weights/in/out  Hyperglycemia Hemoglobin A1C of 6.4. Patient may benefit from metformin. Can be discussed as an outpatient.  CAD -continue Plavix, lipitor and metoprolol  Anemia Stable. Outpatient management.  Pulmonary nodules Patient made aware. Outpatient follow-up.  Weight gain Outpatient follow-up.   DVT prophylaxis: Lovenox Code Status: Full code Family Communication: None at bedside Disposition Plan: Discharge in 24-48 hours   Consultants:   None  Procedures:   None  Antimicrobials:  None    Subjective: Patient reports no chest pain or dyspnea.  Objective: Vitals:   09/23/16 1830 09/23/16 2000 09/23/16 2042 09/24/16 0505  BP: (!) 112/57 106/76 100/70 123/70  Pulse: 77 74 73 73  Resp: (!) 24 (!) 24 (!) 22 18  Temp:   98.2 F (36.8 C) 97.7 F (36.5 C)  TempSrc:   Oral Oral  SpO2: 91% 97% 97% 96%  Weight:    57.5 kg (126 lb 11.2 oz)  Height:        Intake/Output Summary (Last 24 hours) at 09/24/16 1155 Last data filed at 09/24/16  1043  Gross per 24 hour  Intake              240 ml  Output              450 ml  Net             -210 ml   Filed Weights   09/23/16 1642 09/24/16 0505  Weight: 60.9 kg (134 lb 3.2 oz) 57.5 kg (126 lb 11.2 oz)    Examination:  General exam: Appears calm and comfortable  Respiratory system: left lower lobe crackles. Respiratory effort normal. Cardiovascular system: S1 & S2 heard, RRR. No murmurs. Gastrointestinal system: Abdomen is nondistended, soft and nontender. No organomegaly or masses felt. Normal bowel sounds heard. Central nervous system: Alert and oriented. No focal neurological deficits. Extremities: No edema. No calf tenderness Skin: No cyanosis. No rashes Psychiatry: Judgement and insight appear normal. Mood & affect appropriate.     Data Reviewed: I have personally reviewed following labs and imaging studies  CBC:  Recent Labs Lab 09/22/16 2050 09/23/16 1143  WBC 11.4* 9.2  HGB 10.5* 11.1*  HCT 34.0* 36.4  MCV 85.9 87.5  PLT 236 235   Basic Metabolic Panel:  Recent Labs Lab 09/22/16 2050 09/23/16 1143  NA 135 138  K 4.4 4.0  CL 102 98*  CO2 26 31  GLUCOSE 120* 162*  BUN 16 14  CREATININE 0.85 0.98  CALCIUM 9.0 9.2   GFR: Estimated Creatinine Clearance: 32.7 mL/min (by C-G formula  based on SCr of 0.98 mg/dL). Liver Function Tests:  Recent Labs Lab 09/23/16 1143  AST 29  ALT 20  ALKPHOS 96  BILITOT 0.6  PROT 6.3*  ALBUMIN 3.4*   No results for input(s): LIPASE, AMYLASE in the last 168 hours. No results for input(s): AMMONIA in the last 168 hours. Coagulation Profile: No results for input(s): INR, PROTIME in the last 168 hours. Cardiac Enzymes:  Recent Labs Lab 09/23/16 1143 09/23/16 1813 09/24/16 0119  TROPONINI <0.03 <0.03 <0.03   BNP (last 3 results) No results for input(s): PROBNP in the last 8760 hours. HbA1C:  Recent Labs  09/23/16 1143  HGBA1C 6.4*   CBG: No results for input(s): GLUCAP in the last 168  hours. Lipid Profile: No results for input(s): CHOL, HDL, LDLCALC, TRIG, CHOLHDL, LDLDIRECT in the last 72 hours. Thyroid Function Tests:  Recent Labs  09/23/16 1143  TSH 1.897   Anemia Panel: No results for input(s): VITAMINB12, FOLATE, FERRITIN, TIBC, IRON, RETICCTPCT in the last 72 hours. Sepsis Labs: No results for input(s): PROCALCITON, LATICACIDVEN in the last 168 hours.  Recent Results (from the past 240 hour(s))  MRSA PCR Screening     Status: None   Collection Time: 09/23/16  7:04 PM  Result Value Ref Range Status   MRSA by PCR NEGATIVE NEGATIVE Final    Comment:        The GeneXpert MRSA Assay (FDA approved for NASAL specimens only), is one component of a comprehensive MRSA colonization surveillance program. It is not intended to diagnose MRSA infection nor to guide or monitor treatment for MRSA infections.          Radiology Studies: Dg Chest 2 View  Result Date: 09/22/2016 CLINICAL DATA:  Shortness of breath. Bilateral foot swelling. Fall today. EXAM: CHEST  2 VIEW COMPARISON:  Chest radiograph 04/12/2016 FINDINGS: The cardiomediastinal contours are unchanged with aortic atherosclerosis and tortuosity of the thoracic aorta. Pulmonary vasculature is normal. No consolidation, pleural effusion, or pneumothorax. Moderate scoliosis of the thoracic spine. Post right shoulder arthroplasty. Chronic left shoulder deformity. Limited evaluation of the thoracic spine on lateral view due to bony under mineralization and scoliosis. IMPRESSION: 1. No acute abnormality. 2. Thoracic aortic atherosclerosis. 3. Unchanged scoliosis and chronic change of the left proximal humerus. Electronically Signed   By: Rubye Oaks M.D.   On: 09/22/2016 21:30   Dg Sacrum/coccyx  Result Date: 09/23/2016 CLINICAL DATA:  83 year old post fall striking tailbone with ataxia. EXAM: SACRUM AND COCCYX - 2+ VIEW COMPARISON:  CT 02/29/2016 FINDINGS: There is no evidence of fracture or other focal  bone lesions. Degree of bony under mineralization limits assessment. Sacroiliac joints are congruent. Gluteal calcifications as seen on prior CT. IMPRESSION: No evidence of sacral or coccyx fracture allowing for limitations secondary to osteopenia/osteoporosis. Electronically Signed   By: Rubye Oaks M.D.   On: 09/23/2016 00:38   Ct Head Wo Contrast  Result Date: 09/23/2016 CLINICAL DATA:  Fall yesterday and 1 week ago.  Ataxia. EXAM: CT HEAD WITHOUT CONTRAST TECHNIQUE: Contiguous axial images were obtained from the base of the skull through the vertex without intravenous contrast. COMPARISON:  None. FINDINGS: Brain: Generalized atrophy, normal for age. Mild chronic small vessel ischemia. No intracranial hemorrhage, mass effect, or midline shift. No hydrocephalus. The basilar cisterns are patent. No evidence of territorial infarct. No intracranial fluid collection. Vascular: No hyperdense vessel. Skull: No skull fracture.  No focal lesion. Sinuses/Orbits: Paranasal sinuses and mastoid air cells are clear. The visualized orbits are  unremarkable. Bilateral injured placement. Streak artifact from dental hardware. Other: None. IMPRESSION: 1.  No acute intracranial abnormality.  No skull fracture. 2. Age related atrophy with mild chronic small vessel ischemia. Electronically Signed   By: Rubye Oaks M.D.   On: 09/23/2016 01:06   Ct Angio Chest Pe W/cm &/or Wo Cm  Result Date: 09/23/2016 CLINICAL DATA:  Dyspnea and hypoxia with leg swelling EXAM: CT ANGIOGRAPHY CHEST WITH CONTRAST TECHNIQUE: Multidetector CT imaging of the chest was performed using the standard protocol during bolus administration of intravenous contrast. Multiplanar CT image reconstructions and MIPs were obtained to evaluate the vascular anatomy. CONTRAST:  80 mL Isovue 370 intravenous COMPARISON:  Radiograph 09/22/2016 FINDINGS: Cardiovascular: Satisfactory opacification of the pulmonary arteries to the segmental level. No evidence of  pulmonary embolism. Aortic atherosclerosis. Ectatic ascending aorta measuring up to 3.6 cm in diameter. Mild cardiomegaly. Coronary artery calcification. No significant pericardial effusion. Mediastinum/Nodes: Midline trachea. No thyroid mass. No significant mediastinal adenopathy. The esophagus is within normal limits. Lungs/Pleura: Mild emphysema. No focal consolidation or pleural effusion. Scattered bilateral pulmonary nodules, for example left upper lobe 3 mm pulmonary nodule, series 6, image number 33. , subpleural right lower lobe pulmonary nodule measuring 5 mm, series 6, image number 62. Upper Abdomen: No acute abnormality. Musculoskeletal: Moderate compression deformity of T8 with estimated 50% loss of height anteriorly. This is chronic compared to prior radiographs. Review of the MIP images confirms the above findings. IMPRESSION: 1. Negative for acute pulmonary embolus. 2. Mild emphysema. Multiple small pulmonary nodules. No follow-up needed if patient is low-risk (and has no known or suspected primary neoplasm). Non-contrast chest CT can be considered in 12 months if patient is high-risk. This recommendation follows the consensus statement: Guidelines for Management of Incidental Pulmonary Nodules Detected on CT Images: From the Fleischner Society 2017; Radiology 2017; 284:228-243. Aortic Atherosclerosis (ICD10-I70.0) and Emphysema (ICD10-J43.9). Electronically Signed   By: Jasmine Pang M.D.   On: 09/23/2016 01:19   Dg Chest Port 1 View  Result Date: 09/24/2016 CLINICAL DATA:  Shortness of Breath EXAM: PORTABLE CHEST 1 VIEW COMPARISON:  09/22/2016 FINDINGS: There is hyperinflation of the lungs compatible with COPD. Cardiomegaly. No confluent airspace opacities or effusions. Aortic atherosclerosis. No acute bony abnormality. Prior right shoulder replacement. IMPRESSION: Cardiomegaly.  Aortic atherosclerosis.  No active disease. Electronically Signed   By: Charlett Nose M.D.   On: 09/24/2016 11:01    Dg Hand Complete Right  Result Date: 09/22/2016 CLINICAL DATA:  Right hand pain and bruising after fall. EXAM: RIGHT HAND - COMPLETE 3+ VIEW COMPARISON:  None. FINDINGS: No acute fracture or dislocation. Osteoarthritis of the digits, with possible Gull wing deformity suggesting an inflammatory osteoarthritis component. Small radial subluxation of the thumb distal phalanx at the interphalangeal joint appears chronic. Volar subluxation of the second through fourth proximal phalanges at the metacarpal phalangeal joint. Cystic changes in the ulna styloid. The bones are diffusely under mineralized. IMPRESSION: 1. No acute fracture or dislocation of the right hand. 2. 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 Volar subluxation of the digits at the metacarpal phalangeal joint suggests underlying inflammatory arthropathy. Electronically Signed   By: Rubye Oaks M.D.   On: 09/22/2016 21:32        Scheduled Meds: . atorvastatin  40 mg Oral Daily  . clopidogrel  75 mg Oral Daily  . docusate sodium  100 mg Oral BID  . dorzolamide-timolol  1 drop Both Eyes BID  . enoxaparin (LOVENOX) injection  40  mg Subcutaneous Q24H  . furosemide  20 mg Intravenous Daily  . latanoprost  1 drop Both Eyes QHS  . morphine  30 mg Oral Q12H  . Netarsudil Dimesylate  1 drop Both Eyes QHS  . OLANZapine-FLUoxetine  1 capsule Oral QPM  . oxyCODONE  5 mg Oral TID  . pantoprazole  40 mg Oral Daily  . pilocarpine  1 drop Both Eyes QID  . pramipexole  0.25-0.5 mg Oral QHS  . sodium chloride flush  3 mL Intravenous Q12H  . sodium chloride flush  3 mL Intravenous Q12H   Continuous Infusions: . sodium chloride       LOS: 0 days     Jacquelin Hawking, MD [facility] 09/24/2016, 11:55 AM Pager: [phone]  If 7PM-7AM, please contact night-coverage [email or web address] Password [email or web address] 09/24/2016, 11:55 AM

## 2016-09-25 ENCOUNTER — Inpatient Hospital Stay (HOSPITAL_COMMUNITY): Payer: Medicare Other

## 2016-09-25 DIAGNOSIS — I5033 Acute on chronic diastolic (congestive) heart failure: Secondary | ICD-10-CM

## 2016-09-25 DIAGNOSIS — J9621 Acute and chronic respiratory failure with hypoxia: Secondary | ICD-10-CM

## 2016-09-25 LAB — ECHOCARDIOGRAM COMPLETE
Height: 60 in
Weight: 2028.8 oz

## 2016-09-25 LAB — BASIC METABOLIC PANEL
Anion gap: 6 (ref 5–15)
BUN: 16 mg/dL (ref 6–20)
CHLORIDE: 96 mmol/L — AB (ref 101–111)
CO2: 31 mmol/L (ref 22–32)
CREATININE: 1 mg/dL (ref 0.44–1.00)
Calcium: 8.8 mg/dL — ABNORMAL LOW (ref 8.9–10.3)
GFR calc Af Amer: 57 mL/min — ABNORMAL LOW (ref >60)
GFR calc non Af Amer: 50 mL/min — ABNORMAL LOW (ref >60)
GLUCOSE: 115 mg/dL — AB (ref 65–99)
POTASSIUM: 4.3 mmol/L (ref 3.5–5.1)
Sodium: 133 mmol/L — ABNORMAL LOW (ref 135–145)

## 2016-09-25 LAB — BLOOD GAS, ARTERIAL
Acid-Base Excess: 3.1 mmol/L — ABNORMAL HIGH (ref 0.0–2.0)
Bicarbonate: 29.2 mmol/L — ABNORMAL HIGH (ref 20.0–28.0)
Drawn by: 51185
O2 Content: 2 L/min
O2 SAT: 93.7 %
PCO2 ART: 63.6 mmHg — AB (ref 32.0–48.0)
PH ART: 7.284 — AB (ref 7.350–7.450)
PO2 ART: 76.4 mmHg — AB (ref 83.0–108.0)
Patient temperature: 98.6

## 2016-09-25 MED ORDER — FUROSEMIDE 20 MG PO TABS
20.0000 mg | ORAL_TABLET | Freq: Every day | ORAL | Status: DC
  Administered 2016-09-26 – 2016-09-27 (×2): 20 mg via ORAL
  Filled 2016-09-25 (×2): qty 1

## 2016-09-25 MED ORDER — ALPRAZOLAM 0.25 MG PO TABS
0.2500 mg | ORAL_TABLET | Freq: Once | ORAL | Status: AC
  Administered 2016-09-25: 0.25 mg via ORAL
  Filled 2016-09-25: qty 1

## 2016-09-25 NOTE — Progress Notes (Signed)
Patient is tolerating nasal cannula well at this time and states that her breathing is better at this time.

## 2016-09-25 NOTE — Evaluation (Signed)
Physical Therapy Evaluation Patient Details Name: Alexis Yates MRN: [id number] DOB: 1927/12/24 Today's Date: 09/25/2016   History of Present Illness  Patient is an 83 yo female admitted 09/22/16 with acute resp failure with hypoxia, CHF exacerbation, confusion, fall (hit back of her head).     PMH:  CHF, CAD, MI with stents, depression, HTN, chronic pain  Clinical Impression  Patient presents with problems listed below.  Will benefit from acute PT to maximize functional mobility prior to discharge.  Patient with general weakness, recent falls, decreased balance/safety and confusion.  Recommend patient d/c to SNF for continued therapy.    Follow Up Recommendations SNF;Supervision/Assistance - 24 hour    Equipment Recommendations  None recommended by PT    Recommendations for Other Services       Precautions / Restrictions Precautions Precautions: Fall Precaution Comments: 2 recent falls pta Restrictions Weight Bearing Restrictions: No      Mobility  Bed Mobility               General bed mobility comments: Patient in chair  Transfers Overall transfer level: Needs assistance Equipment used: Rolling walker (2 wheeled) Transfers: Sit to/from [employer] Sit to Stand: Min assist Stand pivot transfers: Min guard       General transfer comment: Verbal cues for hand placement.  Assist to power up from chair and BSC.  Patient able to take several shuffle steps to pivot chair <> BSC with assist to steady.  Ambulation/Gait Ambulation/Gait assistance: Min guard Ambulation Distance (Feet): 4 Feet Assistive device: Rolling walker (2 wheeled) Gait Pattern/deviations: Step-through pattern;Decreased step length - right;Decreased step length - left;Decreased stride length;Shuffle;Trunk flexed Gait velocity: decreased   General Gait Details: Slow, unsteady gait with RW.  At 4' had to stop due to "feeling dizzy and weak"  Stairs            Wheelchair  Mobility    Modified Rankin (Stroke Patients Only)       Balance Overall balance assessment: Needs assistance         Standing balance support: Bilateral upper extremity supported Standing balance-Leahy Scale: Poor                               Pertinent Vitals/Pain Pain Assessment: 0-10 Pain Score: 6  Pain Location: Toes, knees, Rt shoulder, posterior head Pain Descriptors / Indicators: Aching;Sore;Tender Pain Intervention(s): Monitored during session;Repositioned    Home Living Family/patient expects to be discharged to:: Private residence Living Arrangements: Alone Available Help at Discharge: Family;Available PRN/intermittently Type of Home: House Home Access: Level entry     Home Layout: One level Home Equipment: Walker - 4 wheels;Cane - single point;Bedside commode;Tub bench;Walker - 2 wheels;Grab bars - toilet;Grab bars - tub/shower      Prior Function Level of Independence: Needs assistance   Gait / Transfers Assistance Needed: Holds furniture during gait in house  ADL's / Homemaking Assistance Needed: Assist to supervise shower for safety  Comments: Off and on staying with daughters. 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  For basic functional tasks                                        General Comments      Exercises     Assessment/Plan    PT Assessment Patient needs continued PT services  PT Problem  List Decreased strength;Decreased activity tolerance;Decreased balance;Decreased mobility;Decreased coordination;Cardiopulmonary status limiting activity;Pain;Decreased range of motion       PT Treatment Interventions DME instruction;Gait training;Functional mobility training;Therapeutic activities;Therapeutic exercise;Balance training;Patient/family education    PT Goals (Current goals can be found in the Care Plan section)  Acute Rehab PT Goals Patient Stated Goal: To get stronger PT Goal Formulation: With patient/family Time For Goal Achievement: 10/09/16 Potential to Achieve Goals: Fair    Frequency Min 3X/week   Barriers to discharge Decreased caregiver support Patient lives alone    Co-evaluation               AM-PAC PT "6 Clicks" Daily Activity  Outcome Measure Difficulty turning over in bed (including adjusting bedclothes, sheets and blankets)?: None Difficulty moving from lying on back to sitting on the side of the bed? : A Lot Difficulty sitting down on and standing up from a chair with arms (e.g., wheelchair, bedside commode, etc,.)?: Total Help needed moving to and from a bed to chair (including a wheelchair)?: A Little Help needed walking in hospital room?: A Little Help needed climbing 3-5 steps with a railing? : A Lot 6 Click Score: 15    End of Session Equipment Utilized During Treatment: Gait belt;Oxygen Activity Tolerance: Patient limited by fatigue Patient left: in chair;with call bell/phone within reach;with chair alarm set;with family/visitor present Nurse Communication: Mobility status PT Visit Diagnosis: Unsteadiness on feet (R26.81);Other abnormalities of gait and mobility (R26.89);Repeated falls (R29.6);Muscle weakness (generalized) (M62.81);Pain Pain - part of body: Shoulder;Knee;Ankle and joints of foot (Posterior head)    Time: 2233-6122 PT Time Calculation (min) (ACUTE ONLY): 35 min   Charges:   PT Evaluation $PT Eval Moderate  Complexity: 1 Procedure PT Treatments $Therapeutic Activity: 8-22 mins   PT G Codes:        Durenda Hurt. Renaldo Fiddler, [facility] Acute Rehab Services Pager [phone]   Vena Austria 09/25/2016, 4:50 PM

## 2016-09-25 NOTE — Progress Notes (Signed)
PT Cancellation Note  Patient Details Name: TAJAHNAE REISDORF MRN: [id number] DOB: 04/11/1927   Cancelled Treatment:    Reason Eval/Treat Not Completed: Patient at procedure or test/unavailable. Pt receiving 2D echo. Will check back later this afternoon.    Colin Broach PT, DPT  [phone]  09/25/2016, 10:43 AM

## 2016-09-25 NOTE — Progress Notes (Signed)
Received call from RN that pt was ready to go on BIPAP. Upon RT assessment, pt was in no obvious respiratory distress, and stated she did not want to go on BIPAP at this time. Pt currently on nasal cannula, and is resting comfortably. Advised pt and daughter to notify RT if pt needs BIPAP tonight. RT will continue to monitor.

## 2016-09-25 NOTE — Progress Notes (Signed)
PROGRESS NOTE    Alexis Yates  RSW:[id number] DOB: 03-Feb-1928 DOA: 09/22/2016 PCP: Marden Noble, MD   Brief Narrative: Alexis Yates is a 83 y.o. female with a history of a solid CHF, myocardial infarction status post stents. She presented with dyspnea with concern for acute heart failure exacerbation. Lasix started.   Assessment & Plan:   Active Problems:   Coronary artery disease involving native coronary artery of native heart with angina pectoris (HCC)   DOE (dyspnea on exertion)   CHF (congestive heart failure) (HCC)   Dyspnea   Acute heart failure (HCC)   Dyspnea Acute respiratory failure with hypoxia Appears to be secondary to a CHF exacerbation with elevated BNP, dyspnea on exertion and crackles on exam. Patient continues to wean off of oxygen. Still with mild crackles even though x-ray/CT scan negative. Possibly underlying lung disease (emphysema seen on CT). Patient now with some confusion per family discussion -transition to lasix 20mg  PO -echocardiogram pending -daily weights/in/out -ABG  Confusion Patient with bizarre actions. In combination with hypoxia. -ABG as above  Hyperglycemia Hemoglobin A1C of 6.4. Patient may benefit from metformin. Can be discussed as an outpatient.  CAD -continue Plavix, lipitor and metoprolol  Anemia Stable. Outpatient management.  Pulmonary nodules Smoking history. Patient made aware. Outpatient follow-up.  Weight gain Outpatient follow-up.   DVT prophylaxis: Lovenox Code Status: Full code Family Communication: None at bedside Disposition Plan: Discharge in 24-48 hours   Consultants:   None  Procedures:   Echocardiogram pending  Antimicrobials:  None    Subjective: No dyspnea.  Objective: Vitals:   09/24/16 2011 09/24/16 2300 09/25/16 0510 09/25/16 0948  BP: 99/70 113/66 100/78 (!) 101/58  Pulse: 80 77 79 81  Resp: (!) 25 (!) 23 17 17   Temp: 98.2 F (36.8 C) 98.3 F (36.8 C) 98 F (36.7 C)    TempSrc: Oral Oral Oral   SpO2: 97% 100% 96% 92%  Weight:   57.5 kg (126 lb 12.8 oz)   Height:        Intake/Output Summary (Last 24 hours) at 09/25/16 1310 Last data filed at 09/25/16 0942  Gross per 24 hour  Intake              563 ml  Output             1000 ml  Net             -437 ml   Filed Weights   09/23/16 1642 09/24/16 0505 09/25/16 0510  Weight: 60.9 kg (134 lb 3.2 oz) 57.5 kg (126 lb 11.2 oz) 57.5 kg (126 lb 12.8 oz)    Examination:  General exam: Appears calm and comfortable  Respiratory system: left lower lobe crackles. Respiratory effort normal. Cardiovascular system: S1 & S2 heard, RRR. No murmurs. Gastrointestinal system: Abdomen is nondistended, soft and nontender. No organomegaly or masses felt. Normal bowel sounds heard. Central nervous system: Alert and oriented. No focal neurological deficits. Extremities: No edema. No calf tenderness Skin: No cyanosis. No rashes Psychiatry: Judgement and insight appear normal. Mood & affect appropriate.     Data Reviewed: I have personally reviewed following labs and imaging studies  CBC:  Recent Labs Lab 09/22/16 2050 09/23/16 1143  WBC 11.4* 9.2  HGB 10.5* 11.1*  HCT 34.0* 36.4  MCV 85.9 87.5  PLT 236 235   Basic Metabolic Panel:  Recent Labs Lab 09/22/16 2050 09/23/16 1143 09/25/16 0406  NA 135 138 133*  K 4.4 4.0 4.3  CL 102 98* 96*  CO2 26 31 31   GLUCOSE 120* 162* 115*  BUN 16 14 16   CREATININE 0.85 0.98 1.00  CALCIUM 9.0 9.2 8.8*   GFR: Estimated Creatinine Clearance: 32.1 mL/min (by C-G formula based on SCr of 1 mg/dL). Liver Function Tests:  Recent Labs Lab 09/23/16 1143  AST 29  ALT 20  ALKPHOS 96  BILITOT 0.6  PROT 6.3*  ALBUMIN 3.4*   No results for input(s): LIPASE, AMYLASE in the last 168 hours. No results for input(s): AMMONIA in the last 168 hours. Coagulation Profile: No results for input(s): INR, PROTIME in the last 168 hours. Cardiac Enzymes:  Recent Labs Lab  09/23/16 1143 09/23/16 1813 09/24/16 0119  TROPONINI <0.03 <0.03 <0.03   BNP (last 3 results) No results for input(s): PROBNP in the last 8760 hours. HbA1C:  Recent Labs  09/23/16 1143  HGBA1C 6.4*   CBG: No results for input(s): GLUCAP in the last 168 hours. Lipid Profile: No results for input(s): CHOL, HDL, LDLCALC, TRIG, CHOLHDL, LDLDIRECT in the last 72 hours. Thyroid Function Tests:  Recent Labs  09/23/16 1143  TSH 1.897   Anemia Panel: No results for input(s): VITAMINB12, FOLATE, FERRITIN, TIBC, IRON, RETICCTPCT in the last 72 hours. Sepsis Labs: No results for input(s): PROCALCITON, LATICACIDVEN in the last 168 hours.  Recent Results (from the past 240 hour(s))  MRSA PCR Screening     Status: None   Collection Time: 09/23/16  7:04 PM  Result Value Ref Range Status   MRSA by PCR NEGATIVE NEGATIVE Final    Comment:        The GeneXpert MRSA Assay (FDA approved for NASAL specimens only), is one component of a comprehensive MRSA colonization surveillance program. It is not intended to diagnose MRSA infection nor to guide or monitor treatment for MRSA infections.          Radiology Studies: Dg Chest Port 1 View  Result Date: 09/24/2016 CLINICAL DATA:  Shortness of Breath EXAM: PORTABLE CHEST 1 VIEW COMPARISON:  09/22/2016 FINDINGS: There is hyperinflation of the lungs compatible with COPD. Cardiomegaly. No confluent airspace opacities or effusions. Aortic atherosclerosis. No acute bony abnormality. Prior right shoulder replacement. IMPRESSION: Cardiomegaly.  Aortic atherosclerosis.  No active disease. Electronically Signed   By: Charlett Nose M.D.   On: 09/24/2016 11:01        Scheduled Meds: . atorvastatin  40 mg Oral Daily  . clopidogrel  75 mg Oral Daily  . docusate sodium  100 mg Oral BID  . dorzolamide-timolol  1 drop Both Eyes BID  . enoxaparin (LOVENOX) injection  40 mg Subcutaneous Q24H  . furosemide  20 mg Intravenous Daily  .  latanoprost  1 drop Both Eyes QHS  . morphine  30 mg Oral Q12H  . Netarsudil Dimesylate  1 drop Both Eyes QHS  . OLANZapine-FLUoxetine  1 capsule Oral QPM  . oxyCODONE  5 mg Oral TID  . pantoprazole  40 mg Oral Daily  . pilocarpine  1 drop Both Eyes QID  . pramipexole  0.25-0.5 mg Oral QHS  . sodium chloride flush  3 mL Intravenous Q12H  . sodium chloride flush  3 mL Intravenous Q12H   Continuous Infusions: . sodium chloride       LOS: 1 day     Jacquelin Hawking, MD [facility] 09/25/2016, 1:10 PM Pager: [phone]  If 7PM-7AM, please contact night-coverage [email or web address] Password [email or web address] 09/25/2016, 1:10 PM

## 2016-09-25 NOTE — Progress Notes (Signed)
RT Note. Taking patient off of Bipap and placing on 2 l [place] so patient can eat.Will continue to monitor sats and place patient back on Bipap when patient is done eating.

## 2016-09-25 NOTE — Progress Notes (Signed)
  Echocardiogram 2D Echocardiogram has been performed.  Alexis Yates Alexis Yates 09/25/2016, 11:11 AM

## 2016-09-26 ENCOUNTER — Inpatient Hospital Stay (HOSPITAL_COMMUNITY): Payer: Medicare Other

## 2016-09-26 LAB — BLOOD GAS, ARTERIAL
ACID-BASE EXCESS: 5.6 mmol/L — AB (ref 0.0–2.0)
BICARBONATE: 31.2 mmol/L — AB (ref 20.0–28.0)
DRAWN BY: 244851
O2 CONTENT: 1 L/min
O2 SAT: 93.6 %
PATIENT TEMPERATURE: 98.3
pCO2 arterial: 59.4 mmHg — ABNORMAL HIGH (ref 32.0–48.0)
pH, Arterial: 7.339 — ABNORMAL LOW (ref 7.350–7.450)
pO2, Arterial: 69.5 mmHg — ABNORMAL LOW (ref 83.0–108.0)

## 2016-09-26 MED ORDER — MORPHINE SULFATE ER 15 MG PO TBCR
15.0000 mg | EXTENDED_RELEASE_TABLET | Freq: Two times a day (BID) | ORAL | Status: DC
  Administered 2016-09-26 – 2016-09-28 (×5): 15 mg via ORAL
  Filled 2016-09-26 (×5): qty 1

## 2016-09-26 MED ORDER — OXYCODONE HCL 5 MG PO TABS
5.0000 mg | ORAL_TABLET | Freq: Three times a day (TID) | ORAL | Status: DC | PRN
Start: 2016-09-26 — End: 2016-09-28
  Administered 2016-09-28: 5 mg via ORAL
  Filled 2016-09-26: qty 1

## 2016-09-26 NOTE — Progress Notes (Signed)
Set pt up on Cpap pt states she wears this at home with ffm.  Pt tolerating well

## 2016-09-26 NOTE — Procedures (Signed)
Called by RN to place pt on Bipap per MD order. On arrival to room daughter questions why the MD wants pt to be on Bipap, pt is awake and alert and in no distress.  Daughter would like MD to come look at pt before placing on Bipap, information relayed to RN.  RN is paging MD.

## 2016-09-26 NOTE — Progress Notes (Addendum)
Per daughter at bedside, pt is talking out of her head. Upon assessment, pt is able to state her age and full name, able to recognize and state daughter's name but disoriented to place. Pt shows no sign of acute distress. Pt agreed to wear Bipap for a little while.

## 2016-09-26 NOTE — Progress Notes (Signed)
Patient placed back on BiPAP after daughter's request, patient tolerating well at this time. RT will continue to monitor.

## 2016-09-26 NOTE — Progress Notes (Signed)
PROGRESS NOTE    Alexis Yates  ZOX:[id number] DOB: 06/10/1927 DOA: 09/22/2016 PCP: Marden Noble, MD   Brief Narrative: Alexis Yates is a 83 y.o. female with a history of a solid CHF, myocardial infarction status post stents. She presented with dyspnea with concern for acute heart failure exacerbation. Lasix started. Developed respiratory failure with hypercarbia and started on Bipap. Improved mentation with Bipap.   Assessment & Plan:   Active Problems:   Coronary artery disease involving native coronary artery of native heart with angina pectoris (HCC)   DOE (dyspnea on exertion)   CHF (congestive heart failure) (HCC)   Dyspnea   Acute heart failure (HCC)   Dyspnea CHF exacerbation mild and does not appear to be contributing to respiratory failure -continue lasix 20mg  PO -daily weights/in/out  Acute respiratory failure with hypoxia and hypercapnia Respiratory acidosis Patient required Bipap for CO2 retention and acidemia. I think this could be secondary to narcotic use -decreased to MS contin 15mg  BID -oxycodone prn pain -repeat ABG  Confusion Patient with bizarre actions. In combination with hypoxia. Found to have hypercarbia. Treated with Bipap  Hyperglycemia Hemoglobin A1C of 6.4. Patient may benefit from metformin. Can be discussed as an outpatient.  CAD -continue Plavix, lipitor and metoprolol  Anemia Stable. Outpatient management.  Pulmonary nodules Smoking history. Patient made aware. Outpatient follow-up.  Weight gain Outpatient follow-up.   DVT prophylaxis: Lovenox Code Status: Full code Family Communication: Daughter at bedside Disposition Plan: Discharge in likely in 24 hours if remains stable to SNF if bed available   Consultants:   None  Procedures:   Echocardiogram (7/14): 65-70% with grade 1 diastolic dysfunction and PAH  Antimicrobials:  None    Subjective: No dyspnea. No chest pain  Objective: Vitals:   09/25/16 1422  09/25/16 1506 09/25/16 2027 09/26/16 0515  BP: (!) 107/55  (!) 142/81 104/66  Pulse:  77 84 82  Resp: 18 15 20  (!) 23  Temp:   98.3 F (36.8 C) 99.2 F (37.3 C)  TempSrc:   Oral Oral  SpO2: 98% 98% 100% 90%  Weight:    57.4 kg (126 lb 8 oz)  Height:        Intake/Output Summary (Last 24 hours) at 09/26/16 0846 Last data filed at 09/26/16 0517  Gross per 24 hour  Intake              843 ml  Output              700 ml  Net              143 ml   Filed Weights   09/24/16 0505 09/25/16 0510 09/26/16 0515  Weight: 57.5 kg (126 lb 11.2 oz) 57.5 kg (126 lb 12.8 oz) 57.4 kg (126 lb 8 oz)    Examination:  General exam: Appears calm and comfortable  Respiratory system: left lower lobe crackles.Effort normal Cardiovascular system: Regular rate and rhythm. Normal S1 and S2. No heart murmurs present. No extra heart sounds Gastrointestinal system: Soft, non-tender, non-distended, no guarding, no rebound, no masses felt Central nervous system: Alert and oriented to person and place. No focal neurological deficits. Extremities: No edema. No calf tenderness Skin: No cyanosis. No rashes Psychiatry: Judgement and insight appear normal. Mood & affect appropriate.     Data Reviewed: I have personally reviewed following labs and imaging studies  CBC:  Recent Labs Lab 09/22/16 2050 09/23/16 1143  WBC 11.4* 9.2  HGB 10.5* 11.1*  HCT 34.0*  36.4  MCV 85.9 87.5  PLT 236 235   Basic Metabolic Panel:  Recent Labs Lab 09/22/16 2050 09/23/16 1143 09/25/16 0406  NA 135 138 133*  K 4.4 4.0 4.3  CL 102 98* 96*  CO2 26 31 31   GLUCOSE 120* 162* 115*  BUN 16 14 16   CREATININE 0.85 0.98 1.00  CALCIUM 9.0 9.2 8.8*   GFR: Estimated Creatinine Clearance: 32.1 mL/min (by C-G formula based on SCr of 1 mg/dL). Liver Function Tests:  Recent Labs Lab 09/23/16 1143  AST 29  ALT 20  ALKPHOS 96  BILITOT 0.6  PROT 6.3*  ALBUMIN 3.4*   No results for input(s): LIPASE, AMYLASE in the  last 168 hours. No results for input(s): AMMONIA in the last 168 hours. Coagulation Profile: No results for input(s): INR, PROTIME in the last 168 hours. Cardiac Enzymes:  Recent Labs Lab 09/23/16 1143 09/23/16 1813 09/24/16 0119  TROPONINI <0.03 <0.03 <0.03   BNP (last 3 results) No results for input(s): PROBNP in the last 8760 hours. HbA1C:  Recent Labs  09/23/16 1143  HGBA1C 6.4*   CBG: No results for input(s): GLUCAP in the last 168 hours. Lipid Profile: No results for input(s): CHOL, HDL, LDLCALC, TRIG, CHOLHDL, LDLDIRECT in the last 72 hours. Thyroid Function Tests:  Recent Labs  09/23/16 1143  TSH 1.897   Anemia Panel: No results for input(s): VITAMINB12, FOLATE, FERRITIN, TIBC, IRON, RETICCTPCT in the last 72 hours. Sepsis Labs: No results for input(s): PROCALCITON, LATICACIDVEN in the last 168 hours.  Recent Results (from the past 240 hour(s))  MRSA PCR Screening     Status: None   Collection Time: 09/23/16  7:04 PM  Result Value Ref Range Status   MRSA by PCR NEGATIVE NEGATIVE Final    Comment:        The GeneXpert MRSA Assay (FDA approved for NASAL specimens only), is one component of a comprehensive MRSA colonization surveillance program. It is not intended to diagnose MRSA infection nor to guide or monitor treatment for MRSA infections.          Radiology Studies: Dg Chest Port 1 View  Result Date: 09/24/2016 CLINICAL DATA:  Shortness of Breath EXAM: PORTABLE CHEST 1 VIEW COMPARISON:  09/22/2016 FINDINGS: There is hyperinflation of the lungs compatible with COPD. Cardiomegaly. No confluent airspace opacities or effusions. Aortic atherosclerosis. No acute bony abnormality. Prior right shoulder replacement. IMPRESSION: Cardiomegaly.  Aortic atherosclerosis.  No active disease. Electronically Signed   By: Charlett Nose M.D.   On: 09/24/2016 11:01        Scheduled Meds: . atorvastatin  40 mg Oral Daily  . clopidogrel  75 mg Oral Daily    . docusate sodium  100 mg Oral BID  . dorzolamide-timolol  1 drop Both Eyes BID  . enoxaparin (LOVENOX) injection  40 mg Subcutaneous Q24H  . furosemide  20 mg Oral Daily  . latanoprost  1 drop Both Eyes QHS  . morphine  30 mg Oral Q12H  . Netarsudil Dimesylate  1 drop Both Eyes QHS  . OLANZapine-FLUoxetine  1 capsule Oral QPM  . oxyCODONE  5 mg Oral TID  . pantoprazole  40 mg Oral Daily  . pilocarpine  1 drop Both Eyes QID  . pramipexole  0.25-0.5 mg Oral QHS  . sodium chloride flush  3 mL Intravenous Q12H  . sodium chloride flush  3 mL Intravenous Q12H   Continuous Infusions: . sodium chloride       LOS: 2 days  Jacquelin Hawking, MD [facility] 09/26/2016, 8:46 AM Pager: [phone]  If 7PM-7AM, please contact night-coverage [email or web address] Password [facility] 09/26/2016, 8:46 AM

## 2016-09-26 NOTE — Progress Notes (Signed)
Disregard previous note.  Noted on wrong pt  Should have been pt in room 19.

## 2016-09-27 DIAGNOSIS — R0609 Other forms of dyspnea: Secondary | ICD-10-CM

## 2016-09-27 LAB — BLOOD GAS, ARTERIAL
ACID-BASE EXCESS: 3.9 mmol/L — AB (ref 0.0–2.0)
BICARBONATE: 29 mmol/L — AB (ref 20.0–28.0)
DRAWN BY: 51191
Delivery systems: POSITIVE
Expiratory PAP: 5
FIO2: 30
INSPIRATORY PAP: 12
O2 SAT: 99.6 %
PATIENT TEMPERATURE: 98.6
PCO2 ART: 53.5 mmHg — AB (ref 32.0–48.0)
PO2 ART: 132 mmHg — AB (ref 83.0–108.0)
RATE: 8 resp/min
pH, Arterial: 7.354 (ref 7.350–7.450)

## 2016-09-27 MED ORDER — IPRATROPIUM-ALBUTEROL 0.5-2.5 (3) MG/3ML IN SOLN
3.0000 mL | Freq: Four times a day (QID) | RESPIRATORY_TRACT | Status: AC
  Administered 2016-09-27 – 2016-09-28 (×3): 3 mL via RESPIRATORY_TRACT
  Filled 2016-09-27 (×3): qty 3

## 2016-09-27 MED ORDER — IPRATROPIUM-ALBUTEROL 0.5-2.5 (3) MG/3ML IN SOLN: 3.0000 mL | Freq: Four times a day (QID) | RESPIRATORY_TRACT | Status: DC | PRN

## 2016-09-27 NOTE — Progress Notes (Signed)
PROGRESS NOTE    SYDNA BRODOWSKI  AVW:[id number] DOB: [birth year] DOA: 09/22/2016 PCP: Marden Noble, MD   Brief Narrative: Alexis Yates is a 83 y.o. female with a history of a solid CHF, myocardial infarction status post stents. She presented with dyspnea with concern for acute heart failure exacerbation. Lasix started. Developed respiratory failure with hypercarbia and started on Bipap. Improved mentation with Bipap.   Assessment & Plan:   Active Problems:   Coronary artery disease involving native coronary artery of native heart with angina pectoris (HCC)   DOE (dyspnea on exertion)   CHF (congestive heart failure) (HCC)   Dyspnea   Acute heart failure (HCC)   Dyspnea CHF exacerbation mild and does not appear to be contributing to respiratory failure -continue lasix 20mg  PO -daily weights/in/out  Acute respiratory failure with hypoxia and hypercapnia Respiratory acidosis Patient required Bipap for CO2 retention and acidemia. I think this could be secondary to narcotic use -continue decreased MS contin 15mg  BID -oxycodone prn pain -pulmonology consult for persistent hypoxia -will likely need outpatient PFTs  Confusion Patient with bizarre actions. In combination with hypoxia. Found to have hypercarbia. Treated with Bipap  Hyperglycemia Hemoglobin A1C of 6.4. Patient may benefit from metformin. Can be discussed as an outpatient.  CAD -continue Plavix, lipitor and metoprolol  Anemia Stable. Outpatient management.  Pulmonary nodules Smoking history. Patient made aware. Outpatient follow-up.  Weight gain Outpatient follow-up.   DVT prophylaxis: Lovenox Code Status: Full code Family Communication: Daughter at bedside Disposition Plan: Discharge to SNF pending pulmonology recommendations   Consultants:   None  Procedures:   Echocardiogram (7/14): 65-70% with grade 1 diastolic dysfunction and PAH  Antimicrobials:  None    Subjective: No dyspnea.  No chest pain.   Objective: Vitals:   09/26/16 0930 09/26/16 1409 09/27/16 0243 09/27/16 0415  BP: 107/63 (!) 98/55 107/85 122/90  Pulse: 89 71 78 88  Resp: 20 14 18 18   Temp:  97.9 F (36.6 C)  98 F (36.7 C)  TempSrc:  Oral  Oral  SpO2: (!) 85% 99% 98% 98%  Weight:    57.2 kg (126 lb 1.7 oz)  Height:        Intake/Output Summary (Last 24 hours) at 09/27/16 1316 Last data filed at 09/27/16 0900  Gross per 24 hour  Intake              822 ml  Output             1600 ml  Net             -778 ml   Filed Weights   09/25/16 0510 09/26/16 0515 09/27/16 0415  Weight: 57.5 kg (126 lb 12.8 oz) 57.4 kg (126 lb 8 oz) 57.2 kg (126 lb 1.7 oz)    Examination:  General exam: Appears calm and comfortable  Respiratory system: Left lower lobe crackles. Normal respiratory effort Cardiovascular system: Regular rate and rhythm. Normal S1 and S2. No heart murmurs present. No extra heart sounds Gastrointestinal system: Soft, non-tender, non-distended, no guarding, no rebound, no masses felt Central nervous system: Alert and oriented to person, place and time. No focal neurological deficits. Extremities: No edema. No calf tenderness Skin: No cyanosis. No rashes Psychiatry: Judgement and insight appear normal. Mood & affect appropriate.     Data Reviewed: I have personally reviewed following labs and imaging studies  CBC:  Recent Labs Lab 09/22/16 2050 09/23/16 1143  WBC 11.4* 9.2  HGB 10.5* 11.1*  HCT  34.0* 36.4  MCV 85.9 87.5  PLT 236 235   Basic Metabolic Panel:  Recent Labs Lab 09/22/16 2050 09/23/16 1143 09/25/16 0406  NA 135 138 133*  K 4.4 4.0 4.3  CL 102 98* 96*  CO2 26 31 31   GLUCOSE 120* 162* 115*  BUN 16 14 16   CREATININE 0.85 0.98 1.00  CALCIUM 9.0 9.2 8.8*   GFR: Estimated Creatinine Clearance: 32 mL/min (by C-G formula based on SCr of 1 mg/dL). Liver Function Tests:  Recent Labs Lab 09/23/16 1143  AST 29  ALT 20  ALKPHOS 96  BILITOT 0.6  PROT  6.3*  ALBUMIN 3.4*   No results for input(s): LIPASE, AMYLASE in the last 168 hours. No results for input(s): AMMONIA in the last 168 hours. Coagulation Profile: No results for input(s): INR, PROTIME in the last 168 hours. Cardiac Enzymes:  Recent Labs Lab 09/23/16 1143 09/23/16 1813 09/24/16 0119  TROPONINI <0.03 <0.03 <0.03   BNP (last 3 results) No results for input(s): PROBNP in the last 8760 hours. HbA1C: No results for input(s): HGBA1C in the last 72 hours. CBG: No results for input(s): GLUCAP in the last 168 hours. Lipid Profile: No results for input(s): CHOL, HDL, LDLCALC, TRIG, CHOLHDL, LDLDIRECT in the last 72 hours. Thyroid Function Tests: No results for input(s): TSH, T4TOTAL, FREET4, T3FREE, THYROIDAB in the last 72 hours. Anemia Panel: No results for input(s): VITAMINB12, FOLATE, FERRITIN, TIBC, IRON, RETICCTPCT in the last 72 hours. Sepsis Labs: No results for input(s): PROCALCITON, LATICACIDVEN in the last 168 hours.  Recent Results (from the past 240 hour(s))  MRSA PCR Screening     Status: None   Collection Time: 09/23/16  7:04 PM  Result Value Ref Range Status   MRSA by PCR NEGATIVE NEGATIVE Final    Comment:        The GeneXpert MRSA Assay (FDA approved for NASAL specimens only), is one component of a comprehensive MRSA colonization surveillance program. It is not intended to diagnose MRSA infection nor to guide or monitor treatment for MRSA infections.          Radiology Studies: Dg Chest Port 1 View  Result Date: 09/26/2016 CLINICAL DATA:  Hypoxia and hypercarbia. EXAM: PORTABLE CHEST 1 VIEW COMPARISON:  09/24/2016. FINDINGS: Stable enlarged cardiac silhouette and tortuous and calcified thoracic aorta. Stable mild prominence of the interstitial markings and mild diffuse peribronchial thickening. Right shoulder prosthesis and superior migration of both humeral heads, including the prosthesis on the right, with associated extensive bony  remodeling as well as left shoulder degenerative changes. IMPRESSION: 1. No acute abnormality. 2. Stable cardiomegaly and mild chronic interstitial lung disease. 3. Chronic, large bilateral rotator cuff tears with a right shoulder prosthesis and left shoulder degenerative changes noted. Electronically Signed   By: Beckie Salts M.D.   On: 09/26/2016 10:06        Scheduled Meds: . atorvastatin  40 mg Oral Daily  . clopidogrel  75 mg Oral Daily  . docusate sodium  100 mg Oral BID  . dorzolamide-timolol  1 drop Both Eyes BID  . enoxaparin (LOVENOX) injection  40 mg Subcutaneous Q24H  . furosemide  20 mg Oral Daily  . latanoprost  1 drop Both Eyes QHS  . morphine  15 mg Oral Q12H  . Netarsudil Dimesylate  1 drop Both Eyes QHS  . OLANZapine-FLUoxetine  1 capsule Oral QPM  . pantoprazole  40 mg Oral Daily  . pilocarpine  1 drop Both Eyes QID  . pramipexole  0.25-0.5 mg Oral QHS  . sodium chloride flush  3 mL Intravenous Q12H  . sodium chloride flush  3 mL Intravenous Q12H   Continuous Infusions: . sodium chloride       LOS: 3 days     Jacquelin Hawking, MD [facility] 09/27/2016, 1:16 PM Pager: [phone]  If 7PM-7AM, please contact night-coverage [email or web address] Password [email or web address] 09/27/2016, 1:16 PM

## 2016-09-27 NOTE — Consult Note (Signed)
Name: Alexis Yates MRN: [id number] DOB: [birth year]    ADMISSION DATE:  09/22/2016 CONSULTATION DATE:  7/16  REFERRING MD :  Dr. Caleb Popp [facility]  CHIEF COMPLAINT:  Mixed respiratory failure  BRIEF PATIENT DESCRIPTION:   SIGNIFICANT EVENTS    STUDIES:  CTA chest 7/12 > Negative for acute pulmonary embolus. Mild emphysema. Multiple small pulmonary nodules. CT head 7/12 >  No acute intracranial abnormality.  Age related atrophy with mild chronic small vessel ischemia. Echo 7/14 > LVEF 65-70%, grade 1 DD. PA peak pressure mild/mod elevation .   HISTORY OF PRESENT ILLNESS:  83 year old female with PMH as below, which is significant for CAD, GERD, Arthritis, and glaucoma. She is a former smoker with a 25 pack-year-history, but her last cigarette was in 1983. She lives alone. She reports SOB which has affected her only minimally over the past few years and notes that it is really only noticeable after she has completed a task when she finds herself catching her breath. This has worsened over the past few weeks, but has not been very noticeable. During that time she denies cough and fever/chills. She does describe worsening lower extremity edema during that time which is a new finding. She has suffered 2 falls recently and after the second, she presented to the ED. She does not recall the events surrounding her fall. Upon arrival to the emergency department 09/23/2016 she was noted to by hypoxic. CTA of the chest negative for PE, but did describe some small pulmonary nodules. BNP was elevated, so with lower extremity edema her hypoxia was thought to be secondary to CHF and she was admitted to the hospitalists for diuresis.   Her oxygen demands slowly decreased with diuresis and she was improving until 7/14 when she developed some confusion. ABG was done and demonstrated hypercarbic respiratory acidosis. She was started on BiPAP and improved. Despite diuresis and bipap she remains hypoxic with  desats to 83% with ambulation on room air . PCCM asked to see.   PAST MEDICAL HISTORY :   has a past medical history of Arthritis; Atherosclerosis of aorta (HCC); CAD (coronary artery disease); Chronic pain; Diverticulosis; Dynamic left ventricular outflow obstruction; Elevated transaminase level; GERD (gastroesophageal reflux disease); Glaucoma; Hiatal hernia; Hypertension; Lung nodules; MI (myocardial infarction) (HCC) (01/09/2016); Scoliosis; and UTI (urinary tract infection).  has a past surgical history that includes Fracture surgery; Abdominal hysterectomy; Eye surgery; Cardiac catheterization (N/A, 01/09/2016); Cardiac catheterization (N/A, 01/09/2016); transthoracic echocardiogram (01/11/2016); and transthoracic echocardiogram (07/08/2016). Prior to Admission medications   Medication Sig Start Date End Date Taking? Authorizing Provider  atorvastatin (LIPITOR) 40 MG tablet Take 1 tablet by mouth daily. 03/16/16  Yes [provider]  bimatoprost (LUMIGAN) 0.01 % SOLN Place 1 drop into both eyes at bedtime.    Yes [provider]  celecoxib (CELEBREX) 200 MG capsule Take 200 mg by mouth 2 (two) times daily.    Yes [provider]  clopidogrel (PLAVIX) 75 MG tablet Take 1 tablet (75 mg total) by mouth daily. 01/14/16  Yes Laverda Page B, NP  docusate sodium (COLACE) 100 MG capsule Take 100 mg by mouth 2 (two) times daily.    Yes [provider]  dorzolamide-timolol (COSOPT) 22.3-6.8 MG/ML ophthalmic solution Place 1 drop into both eyes 2 (two) times daily.   Yes [provider]  esomeprazole (NEXIUM) 40 MG capsule Take 40 mg by mouth daily. 03/19/16  Yes [provider]  metoprolol tartrate (LOPRESSOR) 25 MG tablet Take 1  tablet (25 mg total) by mouth 2 (two) times daily. 08/02/16 10/31/16 Yes Marykay Lex, MD  Netarsudil Dimesylate (RHOPRESSA) 0.02 % SOLN Place 1 drop into both eyes at bedtime.   Yes [provider]    OLANZapine-FLUoxetine (SYMBYAX) 3-25 MG capsule Take 1 capsule by mouth every evening.   Yes [provider]  oxyCODONE (OXY IR/ROXICODONE) 5 MG immediate release tablet Take 1 tablet (5 mg total) by mouth every 6 (six) hours as needed for severe pain (pain). Patient taking differently: Take 5 mg by mouth 3 (three) times daily.  03/09/16  Yes Calvert Cantor, MD  oxymorphone 10 MG PO T12A 12 hr tablet Take 1 tablet (10 mg total) by mouth every 12 (twelve) hours. 03/09/16  Yes Calvert Cantor, MD  pilocarpine (PILOCAR) 1 % ophthalmic solution Place 1 drop into both eyes 4 (four) times daily.   Yes [provider]  Polyethyl Glycol-Propyl Glycol (SYSTANE) 0.4-0.3 % SOLN Place 1 drop into both eyes as needed (for dry eyes).   Yes [provider]  pramipexole (MIRAPEX) 0.25 MG tablet Take 0.25-0.5 mg by mouth at bedtime.  03/19/16  Yes [provider]  senna (SENOKOT) 8.6 MG TABS tablet Take 2 tablets (17.2 mg total) by mouth at bedtime as needed for mild constipation. Patient taking differently: Take 1 tablet by mouth daily as needed for mild constipation.  03/04/16  Yes Johnson, Clanford L, MD  ALPRAZolam (XANAX) 0.25 MG tablet Take 1 tablet (0.25 mg total) by mouth 2 (two) times daily as needed for anxiety. Patient not taking: Reported on 09/22/2016 03/09/16   Calvert Cantor, MD  aspirin EC 81 MG EC tablet Take 1 tablet (81 mg total) by mouth daily. Patient not taking: Reported on 09/22/2016 01/14/16   Laverda Page B, NP  levofloxacin (LEVAQUIN) 500 MG tablet Take 1 tablet (500 mg total) by mouth every other day. Patient not taking: Reported on 09/22/2016 04/16/16   Dorothea Ogle, MD   Allergies  Allergen Reactions  . Penicillins Anaphylaxis    Has patient had a PCN reaction causing immediate rash, facial/tongue/throat swelling, SOB or lightheadedness with hypotension: Yes Has patient had a PCN reaction causing severe rash involving mucus membranes or skin necrosis:  No Has patient had a PCN reaction that required hospitalization Yes Has patient had a PCN reaction occurring within the last 10 years: No If all of the above answers are "NO", then may proceed with Cephalosporin use.   Marland Kitchen Phenergan [Promethazine Hcl] Other (See Comments)    Restless legs    FAMILY HISTORY:  family history includes Cancer in her brother; Diabetes in her mother; Heart disease in her father; Heart failure in her mother. SOCIAL HISTORY:  reports that she has quit smoking. She has never used smokeless tobacco. She reports that she does not drink alcohol or use drugs.  REVIEW OF SYSTEMS:   Bolds are positive  Constitutional: weight loss, gain, night sweats, Fevers, chills, fatigue .  HEENT: headaches, Sore throat, sneezing, nasal congestion, post nasal drip, Difficulty swallowing, Tooth/dental problems, visual complaints visual changes, ear ache CV:  chest pain, radiates:,Orthopnea, PND, swelling in lower extremities, dizziness, palpitations, syncope.  GI  heartburn, indigestion, abdominal pain, nausea, vomiting, diarrhea, change in bowel habits, loss of appetite, bloody stools.  Resp: cough, nonproductive: , hemoptysis, dyspnea on exertion, chest pain, pleuritic.  Skin: rash or itching or icterus GU: dysuria, change in color of urine, urgency or frequency. flank pain, hematuria  MS: joint pain or swelling. decreased  range of motion   Psych: change in mood or affect. depression or anxiety.  Neuro: difficulty with speech, weakness, numbness, ataxia    SUBJECTIVE:   VITAL SIGNS: Temp:  [97.9 F (36.6 C)-98 F (36.7 C)] 98 F (36.7 C) (07/16 0415) Pulse Rate:  [71-88] 88 (07/16 0415) Resp:  [14-18] 18 (07/16 0415) BP: (98-122)/(55-90) 122/90 (07/16 0415) SpO2:  [98 %-99 %] 98 % (07/16 0415) Weight:  [57.2 kg (126 lb 1.7 oz)] 57.2 kg (126 lb 1.7 oz) (07/16 0415)  PHYSICAL EXAMINATION: General:  Frail elderly female in NAD Neuro:  Alert, oriented, non-focal. Some  issues remembering recent events.  HEENT: [place]/AT, pupils pinpoint, no JVD Cardiovascular:  RRR, no MRG Lungs: Fine crackles throuhgout Abdomen:  Soft, non-tender, non-distended Musculoskeletal:  No acute deformity or ROM limitation.  Skin:  Grossly intact.   Recent Labs Lab 09/22/16 2050 09/23/16 1143 09/25/16 0406  NA 135 138 133*  K 4.4 4.0 4.3  CL 102 98* 96*  CO2 26 31 31   BUN 16 14 16   CREATININE 0.85 0.98 1.00  GLUCOSE 120* 162* 115*    Recent Labs Lab 09/22/16 2050 09/23/16 1143  HGB 10.5* 11.1*  HCT 34.0* 36.4  WBC 11.4* 9.2  PLT 236 235   Dg Chest Port 1 View  Result Date: 09/26/2016 CLINICAL DATA:  Hypoxia and hypercarbia. EXAM: PORTABLE CHEST 1 VIEW COMPARISON:  09/24/2016. FINDINGS: Stable enlarged cardiac silhouette and tortuous and calcified thoracic aorta. Stable mild prominence of the interstitial markings and mild diffuse peribronchial thickening. Right shoulder prosthesis and superior migration of both humeral heads, including the prosthesis on the right, with associated extensive bony remodeling as well as left shoulder degenerative changes. IMPRESSION: 1. No acute abnormality. 2. Stable cardiomegaly and mild chronic interstitial lung disease. 3. Chronic, large bilateral rotator cuff tears with a right shoulder prosthesis and left shoulder degenerative changes noted. Electronically Signed   By: Beckie Salts M.D.   On: 09/26/2016 10:06    ASSESSMENT / PLAN:  Acute hypoxemic/hypercarbic respiratory failure:  Hypercarbia remains, but pH has improved. Obviously there is a degree of chronic CO2 retention. It is certainly possible she has some undiagnosed COPD with her smoking history. This could be a contributing factor to her hypoxia, but she doe not seem to be in exacerbation. I agree acute hypercarbia is likely due to pain medication sedation. She describes being much sleepier than usual here. This has been reduced. CHF/volume overload likely playing large role  in hypoxia and functional status given her history. With the exception of her admission weight (which does not coincide with other weights taken), she has really not lost any significant weight since admission. She also has crackles on exam.   Plan: Supplemental O2 as needed to keep O2 sats > 90%  Would continue with diuresis as renal function tolerates, perhaps even be more aggressive.  Would be helpful to assess PFT, however, need to wait until she is closer to her baseline Agree with lower dose of narcotics Outpatient pulmonary follow-up, will arrange prior to discharge.  DC BiPAP for now  Pulmonary nodules: Follow up as above   Joneen Roach, AGACNP-BC [facility] Pulmonology/Critical Care Pager [phone] or [phone]  09/27/2016 12:42 PM   Attending Note:  I have examined patient, reviewed labs, studies and notes. I have discussed the case with Henreitta Leber, and I agree with the data and plans as amended above. 83 year old former smoker (40 pack years) with a history of hypertension and suspected diastolic dysfunction,  diastolic CHF. Also with a history of pain that has required a recent increase in her chronic narcotics use. She was admitted after a fall. Notes that she has been sleepier. Her evaluation here has been consistent with hypoventilation with associated hypercapnia and hypoxemia. She was temporarily treated with BiPAP. CT scan of the chest done on 09/23/16 was reviewed. This shows no evidence of pulmonary embolism but there is some heterogeneity that is suggestive of air trapping and possible emphysematous change. On my exam she is pleasant, interacts appropriately she has crackles bilaterally. Her pH was actually normal and I suspect that her baseline PCO2 is in the high 50s. Suspect that there is at least a component of undiagnosed COPD. I discussed this with her today. She needs pulmonary function testing when she is approaching her baseline. We also need to be careful with  possibly decrease her narcotics given their contribution to her hypoventilation. Agree with current diuresis although note that a contraction alkalosis will likely slowly allow her PCO2 to drift up. We will follow with you, consider empiric trial BD's.   Levy Pupa, MD, PhD 09/27/2016, 1:57 PM [facility] [phone] or if no answer [phone]

## 2016-09-27 NOTE — Clinical Social Work Note (Signed)
Clinical Social Work Assessment  Patient Details  Name: Alexis Yates MRN: [id number] Date of Birth: 09/05/1927  Date of referral:  09/27/16               Reason for consult:  Facility Placement                Permission sought to share information with:  Facility Contact Representative, Family Supports Permission granted to share information::  Yes, Verbal Permission Granted  Name::      Alexis Yates  Agency::  SNFs  Relationship::  daughter  Contact Information:  [phone]  Housing/Transportation Living arrangements for the past 2 months:  Single Family Home Source of Information:  Patient Patient Interpreter Needed:  None Criminal Activity/Legal Involvement Pertinent to Current Situation/Hospitalization:  No - Comment as needed Significant Relationships:  Adult Children Lives with:  Self Do you feel safe going back to the place where you live?  Yes Need for family participation in patient care:  Yes (Comment)  Care giving concerns:  Patient is from home alone. Patient has to daughters that live in the area. PT recommends SNF.  Social Worker assessment / plan:  CSW met with patient at bedside and spoke to daughter Alexis Yates over the phone. Daughter agreeable to SNF placement, indicating patient does not have someone to help her at home. Daughter on her way to hospital. CSW will provide bed offers at bedside when daughter arrives. CSW will support with discharge to SNF.  Employment status:  Retired Insurance information:  Managed Medicare ([employer]) PT Recommendations:  Skilled Nursing Facility Information / Referral to community resources:  Skilled Nursing Facility  Patient/Family's Response to care: Patient disoriented to situation, but appreciative of care.  Patient/Family's Understanding of and Emotional Response to Diagnosis, Current Treatment, and Prognosis: Patient's daughter with good understanding of patient's condition. Daughter agreeable to SNF and hopeful for  patient to have some mobility recovery in rehab.  Emotional Assessment Appearance:  Appears stated age Attitude/Demeanor/Rapport:  Other (appropriate) Affect (typically observed):  Appropriate, Calm Orientation:  Oriented to Self, Oriented to Place Alcohol / Substance use:  Not Applicable Psych involvement (Current and /or in the community):  No (Comment)  Discharge Needs  Concerns to be addressed:  Discharge Planning Concerns Readmission within the last 30 days:  No Current discharge risk:  Lives alone, Dependent with Mobility Barriers to Discharge:  No Barriers Identified    , LCSW 09/27/2016, 12:04 PM  

## 2016-09-27 NOTE — Progress Notes (Signed)
Physical Therapy Treatment Patient Details Name: Alexis Yates MRN: [id number] DOB: 04/17/1927 Today's Date: 09/27/2016    History of Present Illness Patient is an 83 yo female admitted 09/22/16 with acute resp failure with hypoxia, CHF exacerbation, confusion, fall (hit back of her head).     PMH:  CHF, CAD, MI with stents, depression, HTN, chronic pain    PT Comments    Patient progressing slowly towards PT goals. Tolerated gait training with Min A for balance/safety. Pt with decreased activity tolerance and weakness BLEs. Sp02 dropped on RA to 83%, so donned 2L/min 02 and 02 ranging from 87-95%. Pt fatigues quickly. Continues to be appropriate for SNF. Will follow acutely.   Follow Up Recommendations  SNF;Supervision/Assistance - 24 hour     Equipment Recommendations  None recommended by PT    Recommendations for Other Services       Precautions / Restrictions Precautions Precautions: Fall Precaution Comments: 2 recent falls pta; watch 02 Restrictions Weight Bearing Restrictions: No    Mobility  Bed Mobility Overal bed mobility: Needs Assistance Bed Mobility: Supine to Sit     Supine to sit: Min assist;HOB elevated     General bed mobility comments: Increased time to get to EOB qwith assist scooting bottom. No dizziness.   Transfers Overall transfer level: Needs assistance Equipment used: Rolling walker (2 wheeled) Transfers: Sit to/from Stand Sit to Stand: Min guard         General transfer comment: Min guard for safety. Stood from [employer], from [facility] x1, transferred to chair post ambulation.   Ambulation/Gait Ambulation/Gait assistance: Min assist Ambulation Distance (Feet): 18 Feet (x2 bouts) Assistive device: Rolling walker (2 wheeled) Gait Pattern/deviations: Step-through pattern;Decreased step length - right;Decreased step length - left;Decreased stride length;Shuffle;Trunk flexed Gait velocity: decreased   General Gait Details: Slow, unsteady gait  with RW. Limited by feeling weak. 2/4 DOE. Sp02 dropped to 83% on RA; donned 02 and ranged from 89-95% on 2L/min 02.   Stairs            Wheelchair Mobility    Modified Rankin (Stroke Patients Only)       Balance Overall balance assessment: Needs assistance Sitting-balance support: Feet supported;No upper extremity supported Sitting balance-Leahy Scale: Fair Sitting balance - Comments: Able to lean posteriorly to put in eye drops without LOB.   Standing balance support: During functional activity;Bilateral upper extremity supported Standing balance-Leahy Scale: Poor Standing balance comment: Reliant on BUEs for support in standing. Able to perform pericare without assist.                             Cognition Arousal/Alertness: Awake/alert Behavior During Therapy: WFL for tasks assessed/performed Overall Cognitive Status: Within Functional Limits for tasks assessed                                        Exercises      General Comments General comments (skin integrity, edema, etc.): HR ranging 80-90s bpm A-fib.      Pertinent Vitals/Pain Pain Assessment: 0-10 Pain Score: 5  Pain Location: bil knees Pain Descriptors / Indicators: Aching;Sore;Tender Pain Intervention(s): Monitored during session;Repositioned;Limited activity within patient's tolerance;RN gave pain meds during session    Home Living                      Prior  Function            PT Goals (current goals can now be found in the care plan section) Progress towards PT goals: Progressing toward goals (slowly)    Frequency    Min 3X/week      PT Plan Current plan remains appropriate    Co-evaluation              AM-PAC PT "6 Clicks" Daily Activity  Outcome Measure  Difficulty turning over in bed (including adjusting bedclothes, sheets and blankets)?: None Difficulty moving from lying on back to sitting on the side of the bed? : Total Difficulty  sitting down on and standing up from a chair with arms (e.g., wheelchair, bedside commode, etc,.)?: None Help needed moving to and from a bed to chair (including a wheelchair)?: A Little Help needed walking in hospital room?: A Little Help needed climbing 3-5 steps with a railing? : A Lot 6 Click Score: 17    End of Session Equipment Utilized During Treatment: Gait belt;Oxygen Activity Tolerance: Patient limited by pain;Patient limited by fatigue Patient left: in chair;with call bell/phone within reach;with chair alarm set Nurse Communication: Mobility status PT Visit Diagnosis: Unsteadiness on feet (R26.81);Other abnormalities of gait and mobility (R26.89);Repeated falls (R29.6);Muscle weakness (generalized) (M62.81);Pain Pain - Right/Left:  (bilateral) Pain - part of body: Knee     Time: 1610-9604 PT Time Calculation (min) (ACUTE ONLY): 27 min  Charges:  $Gait Training: 8-22 mins $Therapeutic Activity: 8-22 mins                    G Codes:       Mylo Red, PT, DPT [phone]     Alexis Yates 09/27/2016, 10:53 AM

## 2016-09-27 NOTE — NC FL2 (Signed)
[place] MEDICAID FL2 LEVEL OF CARE SCREENING TOOL     IDENTIFICATION  Patient Name: Alexis Yates Birthdate: 01-Jul-1927 Sex: female Admission Date (Current Location): 09/22/2016  [facility] and [place] Number:  Producer, television/film/video and Address:  [facility] [place], [address], [place], [place] [address]      Provider Number: [id number]  Attending Physician Name and Address:  Narda Bonds, MD  Relative Name and Phone Number:       Current Level of Care: Hospital Recommended Level of Care: Skilled Nursing Facility Prior Approval Number:    Date Approved/Denied:   PASRR Number:  [id number]   Discharge Plan: SNF    Current Diagnoses: Patient Active Problem List   Diagnosis Date Noted  . CHF (congestive heart failure) (HCC) 09/23/2016  . Dyspnea 09/23/2016  . Acute heart failure (HCC) 09/23/2016  . Dyslipidemia, goal LDL below 70 08/04/2016  . DOE (dyspnea on exertion) 06/24/2016  . Dynamic left ventricular outflow obstruction 04/24/2016  . Coronary artery disease involving native coronary artery of native heart with angina pectoris (HCC) 04/24/2016  . Hypotension 04/12/2016  . HCAP (healthcare-associated pneumonia) 04/12/2016  . History of MI (myocardial infarction) 04/12/2016  . Depression with anxiety 04/12/2016  . Chronic pain syndrome 04/12/2016  . Abdominal pain 03/07/2016  . Anemia 03/06/2016  . Diarrhea 03/06/2016  . Other fatigue 03/06/2016  . Elevated transaminase level 03/06/2016  . Chronic pain   . Diverticulitis 02/29/2016  . ST elevation myocardial infarction (STEMI) of inferior wall, subsequent episode of care (HCC) 01/09/2016  . Urinary tract infection without hematuria   . NSTEMI (non-ST elevated myocardial infarction) (HCC) 01/08/2016  . UTI (urinary tract infection) 05/27/2014  . PID (acute pelvic inflammatory disease) 05/27/2014  . Vaginal inflammation from pessary (HCC) 05/27/2014  . Leukocytosis 05/27/2014  . Nausea  and vomiting 05/27/2014  . Hypertension   . Hiatal hernia   . Lung nodules   . Glaucoma   . GERD (gastroesophageal reflux disease)     Orientation RESPIRATION BLADDER Height & Weight     Self, Place  O2 (nasal cannula 2L) Continent Weight: 126 lb 1.7 oz (57.2 kg) Height:  5' (152.4 cm)  BEHAVIORAL SYMPTOMS/MOOD NEUROLOGICAL BOWEL NUTRITION STATUS      Continent Diet (heart diet; please see DC summary)  AMBULATORY STATUS COMMUNICATION OF NEEDS Skin   Limited Assist Verbally Normal                       Personal Care Assistance Level of Assistance  Bathing, Feeding, Dressing Bathing Assistance: Limited assistance Feeding assistance: Independent Dressing Assistance: Limited assistance     Functional Limitations Info  Sight, Hearing, Speech Sight Info: Adequate Hearing Info: Adequate Speech Info: Adequate    SPECIAL CARE FACTORS FREQUENCY  PT (By licensed PT)     PT Frequency: 5x/week              Contractures      Additional Factors Info  Code Status, Allergies Code Status Info: Full Allergies Info: Penicillins, Phenergan Promethazine Hcl           Current Medications (09/27/2016):  This is the current hospital active medication list Current Facility-Administered Medications  Medication Dose Route Frequency Provider Last Rate Last Dose  . 0.9 %  sodium chloride infusion  250 mL Intravenous PRN Pearson Grippe, MD      . atorvastatin (LIPITOR) tablet 40 mg  40 mg Oral Daily Pearson Grippe, MD  40 mg at 09/27/16 1023  . clopidogrel (PLAVIX) tablet 75 mg  75 mg Oral Daily Pearson Grippe, MD   75 mg at 09/27/16 1023  . docusate sodium (COLACE) capsule 100 mg  100 mg Oral BID Pearson Grippe, MD   100 mg at 09/27/16 1022  . dorzolamide-timolol (COSOPT) 22.3-6.8 MG/ML ophthalmic solution 1 drop  1 drop Both Eyes BID Pearson Grippe, MD   1 drop at 09/27/16 1024  . enoxaparin (LOVENOX) injection 40 mg  40 mg Subcutaneous Q24H Pearson Grippe, MD   40 mg at 09/26/16 1402  . furosemide  (LASIX) tablet 20 mg  20 mg Oral Daily Narda Bonds, MD   20 mg at 09/27/16 1023  . latanoprost (XALATAN) 0.005 % ophthalmic solution 1 drop  1 drop Both Eyes QHS Pearson Grippe, MD   1 drop at 09/26/16 2115  . morphine (MS CONTIN) 12 hr tablet 15 mg  15 mg Oral Q12H Narda Bonds, MD   15 mg at 09/27/16 1023  . Netarsudil Dimesylate 0.02 % solution (Rhopressa)  1 drop Both Eyes QHS Pearson Grippe, MD   1 drop at 09/26/16 2113  . OLANZapine-FLUoxetine (SYMBYAX) 3-25 MG per capsule 1 capsule  1 capsule Oral QPM Pearson Grippe, MD   1 capsule at 09/26/16 1844  . ondansetron (ZOFRAN) injection 4 mg  4 mg Intravenous Q8H PRN Narda Bonds, MD   4 mg at 09/27/16 1119  . oxyCODONE (Oxy IR/ROXICODONE) immediate release tablet 5 mg  5 mg Oral TID PRN Narda Bonds, MD      . pantoprazole (PROTONIX) EC tablet 40 mg  40 mg Oral Daily Pearson Grippe, MD   40 mg at 09/27/16 1023  . pilocarpine (PILOCAR) 1 % ophthalmic solution 1 drop  1 drop Both Eyes QID Pearson Grippe, MD   1 drop at 09/27/16 1024  . polyvinyl alcohol (LIQUIFILM TEARS) 1.4 % ophthalmic solution 1 drop  1 drop Both Eyes PRN Narda Bonds, MD      . pramipexole (MIRAPEX) tablet 0.25-0.5 mg  0.25-0.5 mg Oral QHS Pearson Grippe, MD   0.5 mg at 09/26/16 2123  . senna (SENOKOT) tablet 8.6 mg  1 tablet Oral Daily PRN Pearson Grippe, MD      . sodium chloride flush (NS) 0.9 % injection 3 mL  3 mL Intravenous Q12H Pearson Grippe, MD   3 mL at 09/26/16 2116  . sodium chloride flush (NS) 0.9 % injection 3 mL  3 mL Intravenous Q12H Pearson Grippe, MD   3 mL at 09/27/16 1025  . sodium chloride flush (NS) 0.9 % injection 3 mL  3 mL Intravenous PRN Pearson Grippe, MD         Discharge Medications: Please see discharge summary for a list of discharge medications.  Relevant Imaging Results:  Relevant Lab Results:   Additional Information SSN: [id number]  Abigail Butts, LCSW

## 2016-09-28 ENCOUNTER — Inpatient Hospital Stay (HOSPITAL_COMMUNITY): Payer: Medicare Other

## 2016-09-28 DIAGNOSIS — I5033 Acute on chronic diastolic (congestive) heart failure: Secondary | ICD-10-CM

## 2016-09-28 DIAGNOSIS — J9601 Acute respiratory failure with hypoxia: Secondary | ICD-10-CM

## 2016-09-28 DIAGNOSIS — J9602 Acute respiratory failure with hypercapnia: Secondary | ICD-10-CM

## 2016-09-28 LAB — SPIROMETRY WITH GRAPH
FEF 25-75 Pre: 0.41 L/sec
FEF2575-%PRED-PRE: 44 %
FEV1-%PRED-PRE: 45 %
FEV1-PRE: 0.68 L
FEV1FVC-%Pred-Pre: 96 %
FEV6-%PRED-PRE: 51 %
FEV6-PRE: 0.99 L
FEV6FVC-%Pred-Pre: 107 %
FVC-%PRED-PRE: 47 %
FVC-PRE: 0.99 L
PRE FEV6/FVC RATIO: 100 %
Pre FEV1/FVC ratio: 69 %

## 2016-09-28 MED ORDER — SENNA 8.6 MG PO TABS: 1.0000 | ORAL_TABLET | Freq: Every day | ORAL | Status: DC

## 2016-09-28 MED ORDER — OXYMORPHONE HCL ER 7.5 MG PO T12A: 7.5000 mg | EXTENDED_RELEASE_TABLET | Freq: Two times a day (BID) | ORAL | 0 refills | Status: DC

## 2016-09-28 MED ORDER — FUROSEMIDE 20 MG PO TABS
10.0000 mg | ORAL_TABLET | Freq: Every day | ORAL | Status: DC
  Administered 2016-09-28: 10 mg via ORAL
  Filled 2016-09-28: qty 1

## 2016-09-28 MED ORDER — FUROSEMIDE 20 MG PO TABS: 10.0000 mg | ORAL_TABLET | Freq: Every day | ORAL | Status: AC

## 2016-09-28 MED ORDER — IPRATROPIUM-ALBUTEROL 0.5-2.5 (3) MG/3ML IN SOLN
3.0000 mL | Freq: Four times a day (QID) | RESPIRATORY_TRACT | Status: AC | PRN
Start: 2016-09-28 — End: ?

## 2016-09-28 MED ORDER — OXYCODONE HCL 5 MG PO TABS: 5.0000 mg | ORAL_TABLET | Freq: Three times a day (TID) | ORAL | 0 refills | Status: AC | PRN

## 2016-09-28 NOTE — Care Management Important Message (Signed)
Important Message  Patient Details  Name: JAELI GOLDEN MRN: [id number] Date of Birth: [birth year]   Medicare Important Message Given:  Yes    Kyla Balzarine 09/28/2016, 10:34 AM

## 2016-09-28 NOTE — Progress Notes (Signed)
Name: Alexis Yates MRN: [id number] DOB: [birth year]    ADMISSION DATE:  09/22/2016 CONSULTATION DATE:  7/16  REFERRING MD :  Dr. Caleb Popp TR[facility]  CHIEF COMPLAINT:  Mixed respiratory failure  BRIEF PATIENT DESCRIPTION:   SIGNIFICANT EVENTS    STUDIES:  CTA chest 7/12 > Negative for acute pulmonary embolus. Mild emphysema. Multiple small pulmonary nodules. CT head 7/12 >  No acute intracranial abnormality.  Age related atrophy with mild chronic small vessel ischemia. Echo 7/14 > LVEF 65-70%, grade 1 DD. PA peak pressure mild/mod elevation .   HISTORY OF PRESENT ILLNESS:  83 year old female with PMH as below, which is significant for CAD, GERD, Arthritis, and glaucoma. She is a former smoker with a 25 pack-year-history, but her last cigarette was in 1983. She lives alone. She reports SOB which has affected her only minimally over the past few years and notes that it is really only noticeable after she has completed a task when she finds herself catching her breath. This has worsened over the past few weeks, but has not been very noticeable. During that time she denies cough and fever/chills. She does describe worsening lower extremity edema during that time which is a new finding. She has suffered 2 falls recently and after the second, she presented to the ED. She does not recall the events surrounding her fall. Upon arrival to the emergency department 09/23/2016 she was noted to by hypoxic. CTA of the chest negative for PE, but did describe some small pulmonary nodules. BNP was elevated, so with lower extremity edema her hypoxia was thought to be secondary to CHF and she was admitted to the hospitalists for diuresis.   Her oxygen demands slowly decreased with diuresis and she was improving until 7/14 when she developed some confusion. ABG was done and demonstrated hypercarbic respiratory acidosis. She was started on BiPAP and improved. Despite diuresis and bipap she remains hypoxic with  desats to 83% with ambulation on room air . PCCM asked to see.    SUBJECTIVE: Feeling better today. Much more awake per family. Daughter tell me that [name] had been having worsening knee pain prior to admit and had increased her pain meds at home.   VITAL SIGNS: Temp:  [97.9 F (36.6 C)-98.4 F (36.9 C)] 97.9 F (36.6 C) (07/17 0405) Pulse Rate:  [79-85] 79 (07/17 0405) Resp:  [17-23] 17 (07/17 0405) BP: (91-129)/(48-62) 91/48 (07/17 0405) SpO2:  [97 %-100 %] 97 % (07/17 0943) Weight:  [57.8 kg (127 lb 6.4 oz)] 57.8 kg (127 lb 6.4 oz) (07/17 0405)  PHYSICAL EXAMINATION: General:  Frail elderly female in NAD Neuro:  Alert, oriented, non-focal.  HEENT: [place]/AT, PERRL, no JVD Cardiovascular:  RRR, no MRG Lungs: Fine crackles throughout. Respirations even, unlabored Abdomen:  Soft, non-tender, non-distended Musculoskeletal:  No acute deformity or ROM limitation.  Skin:  Grossly intact.   Recent Labs Lab 09/22/16 2050 09/23/16 1143 09/25/16 0406  NA 135 138 133*  K 4.4 4.0 4.3  CL 102 98* 96*  CO2 26 31 31   BUN 16 14 16   CREATININE 0.85 0.98 1.00  GLUCOSE 120* 162* 115*    Recent Labs Lab 09/22/16 2050 09/23/16 1143  HGB 10.5* 11.1*  HCT 34.0* 36.4  WBC 11.4* 9.2  PLT 236 235   No results found.  ASSESSMENT / PLAN:  Acute hypoxemic/hypercarbic respiratory failure:  Hypercarbia remains, but pH has improved. Obviously there is a degree of chronic CO2 retention. It is certainly possible she  has some undiagnosed COPD with her smoking history. This could be a contributing factor to her hypoxia, but she doe not seem to be in exacerbation. I agree acute hypercarbia is likely due to pain medication sedation. She describes being much sleepier than usual here. This has been reduced. CHF/volume overload likely playing large role in hypoxia and functional status given her history. With the exception of her admission weight (which does not coincide with other weights taken),  she has really not lost any significant weight since admission. She also has crackles on exam.   She is much more alert now that pH is normalized and narcotics have decreased. Breathing is close to baseline per her reports.    Plan: Supplemental O2 as needed to keep O2 sats > 90%  Continued diuresis PRN albuterol Spirometry today Check O2 sat with ambulation She has benefited from lower narcotic dose. Outpatient pulmonary follow-up, will arrange prior to discharge.   Pulmonary nodules: Follow up as above  Joneen Roach, AGACNP-BC [facility] Pulmonology/Critical Care Pager [phone] or [phone]  09/28/2016 12:27 PM  Attending Note:  I have examined patient, reviewed labs, studies and notes. I have discussed the case with Henreitta Leber, and I agree with the data and plans as amended above. Former smoker with hypertension, diastolic dysfunction admitted with lower extremity edema and hypoxemic/hypercapnic respiratory failure. She was temporized with BiPAP. She has had a recent increase in her chronic narcotics, and I believe that this is likely playing a role here. She has never been formally evaluated for obstructive lung disease and this needs to be done. It sounds like she is going to be discharged from the hospital today so we will arrange for spirometry as an outpatient and pulmonary follow-up. Discussed with her and her daughter today the possible contribution of the increased narcotics to her somnolence and hypercapnia. She desaturated with ambulation and we will perform a titration to see, to oxygen she needs for discharge.   Levy Pupa, MD, PhD 09/28/2016, 2:35 PM [facility] [phone] or if no answer [phone]

## 2016-09-28 NOTE — Progress Notes (Signed)
Physical Therapy Treatment Patient Details Name: Alexis Yates MRN: [id number] DOB: 26-May-1927 Today's Date: 09/28/2016    History of Present Illness Patient is an 83 yo female admitted 09/22/16 with acute resp failure with hypoxia, CHF exacerbation, confusion, fall (hit back of her head).     PMH:  CHF, CAD, MI with stents, depression, HTN, chronic pain    PT Comments    Pt performed increased activity during session this am.  Pt remains slow and guarded and fatigues quickly.  Pt continues to desaturate and required increased O2 during ambulation.  Plan for Short Term SNF remains appropriate.  Will plan to advance mobility next session to continue functional gains.     Follow Up Recommendations  SNF;Supervision/Assistance - 24 hour     Equipment Recommendations  None recommended by PT    Recommendations for Other Services       Precautions / Restrictions Precautions Precautions: Fall Precaution Comments: 2 recent falls pta; watch 02 Restrictions Weight Bearing Restrictions: No    Mobility  Bed Mobility Overal bed mobility: Needs Assistance Bed Mobility: Supine to Sit     Supine to sit: Min assist     General bed mobility comments: Cues for hand placement difficulty reaching for rail with RUE due to previous shoulder surgery and limited ROM.  Pt required increased time to scoot to edge of bed and min assist for trunk elevation.   Transfers Overall transfer level: Needs assistance Equipment used: Rolling walker (2 wheeled) Transfers: Sit to/from Stand Sit to Stand: Min guard         General transfer comment: Cues for hand placement to and from seated surface, transfer from bed and BSC and ended in recliner chair.    Ambulation/Gait Ambulation/Gait assistance: Min assist Ambulation Distance (Feet): 50 Feet Assistive device: Rolling walker (2 wheeled) Gait Pattern/deviations: Step-through pattern;Decreased step length - right;Decreased step length - left;Decreased  stride length;Shuffle;Trunk flexed Gait velocity: decreased   General Gait Details: Slow, unsteady gait with RW. Limited by feeling weak. 2/4 DOE. Sp02 ranged 83%-96% on 2L increased to 3L to maintain O2 sats greater than 90%. Cues for RW position, increasing stride length, and improving posture.     Stairs            Wheelchair Mobility    Modified Rankin (Stroke Patients Only)       Balance Overall balance assessment: Needs assistance Sitting-balance support: Feet supported;No upper extremity supported Sitting balance-Leahy Scale: Fair Sitting balance - Comments: Able to lean posteriorly to put in eye drops without LOB.   Standing balance support: During functional activity;Bilateral upper extremity supported Standing balance-Leahy Scale: Poor Standing balance comment: Reliant on BUEs for support in standing. Able to perform pericare without assist.                             Cognition Arousal/Alertness: Awake/alert Behavior During Therapy: WFL for tasks assessed/performed Overall Cognitive Status: Within Functional Limits for tasks assessed                                        Exercises      General Comments        Pertinent Vitals/Pain Pain Assessment: 0-10 Pain Score: 2  Pain Location: bil knees Pain Descriptors / Indicators: Aching;Sore;Tender Pain Intervention(s): Monitored during session;Repositioned    Home Living  Prior Function            PT Goals (current goals can now be found in the care plan section) Acute Rehab PT Goals Patient Stated Goal: To get stronger Potential to Achieve Goals: Fair    Frequency    Min 3X/week      PT Plan Current plan remains appropriate    Co-evaluation              AM-PAC PT "6 Clicks" Daily Activity  Outcome Measure  Difficulty turning over in bed (including adjusting bedclothes, sheets and blankets)?: A Lot Difficulty moving from  lying on back to sitting on the side of the bed? : Total Difficulty sitting down on and standing up from a chair with arms (e.g., wheelchair, bedside commode, etc,.)?: Total Help needed moving to and from a bed to chair (including a wheelchair)?: A Little Help needed walking in hospital room?: A Little Help needed climbing 3-5 steps with a railing? : A Lot 6 Click Score: 12    End of Session Equipment Utilized During Treatment: Gait belt;Oxygen Activity Tolerance: Patient limited by pain;Patient limited by fatigue Patient left: in chair;with call bell/phone within reach;with chair alarm set Nurse Communication: Mobility status PT Visit Diagnosis: Unsteadiness on feet (R26.81);Other abnormalities of gait and mobility (R26.89);Repeated falls (R29.6);Muscle weakness (generalized) (M62.81);Pain Pain - Right/Left:  (BIlateral) Pain - part of body: Knee     Time: 1010-1041 PT Time Calculation (min) (ACUTE ONLY): 31 min  Charges:  $Gait Training: 8-22 mins $Therapeutic Activity: 8-22 mins                    G Codes:       Alexis Yates, PTA pager [phone]    Alexis Yates 09/28/2016, 10:57 AM

## 2016-09-28 NOTE — Care Management Note (Signed)
Case Management Note  Patient Details  Name: AUBRYANA GLAUDE MRN: [id number] Date of Birth: 1928/03/05  Subjective/Objective: Pt presented for CHF- IV Lasix initiated and changed to orals. Pt with Respiratory Failure-initiated on BIPAP and weaned to [place]. Plan for SNF once stable.                    Action/Plan: CSW following for SNF bed at [facility]. No further needs from CM at this time.   Expected Discharge Date:                  Expected Discharge Plan:  Skilled Nursing Facility  In-House Referral:  Clinical Social Work  Discharge planning Services  CM Consult  Post Acute Care Choice:  NA Choice offered to:  NA  DME Arranged:  N/A DME Agency:  NA  HH Arranged:  NA HH Agency:  NA  Status of Service:  Completed, signed off  If discussed at Long Length of Stay Meetings, dates discussed:    Additional Comments:  Gala Lewandowsky, RN 09/28/2016, 12:48 PM

## 2016-09-28 NOTE — Progress Notes (Signed)
Ambulated pt with 2L O2, initially when she stood up pt o2 sat decreased to low 80s. After standing for a minute O2 sat stabilized at 95%. While walking she maintained at 95% with 2L O2. I then took the oxygen off and her O2 sat dropped to 85%; oxygen was placed back on the pt where her oxygen stabilized.

## 2016-09-28 NOTE — Clinical Social Work Placement (Signed)
   CLINICAL SOCIAL WORK PLACEMENT  NOTE  Date:  09/28/2016  Patient Details  Name: Alexis Yates MRN: [id number] Date of Birth: [birth year]  Clinical Social Work is seeking post-discharge placement for this patient at the Skilled  Nursing Facility level of care (*CSW will initial, date and re-position this form in  chart as items are completed):  Yes   Patient/family provided with [facility] Clinical Social Work Department's list of facilities offering this level of care within the geographic area requested by the patient (or if unable, by the patient's family).  Yes   Patient/family informed of their freedom to choose among providers that offer the needed level of care, that participate in Medicare, Medicaid or managed care program needed by the patient, have an available bed and are willing to accept the patient.  Yes   Patient/family informed of [facility]'s ownership interest in [facility] and [facility], as well as of the fact that they are under no obligation to receive care at these facilities.  PASRR submitted to EDS on       PASRR number received on 09/27/16     Existing PASRR number confirmed on       FL2 transmitted to all facilities in geographic area requested by pt/family on 09/27/16     FL2 transmitted to all facilities within larger geographic area on       Patient informed that his/her managed care company has contracts with or will negotiate with certain facilities, including the following:  [facility] [place]     Yes   Patient/family informed of bed offers received.  Patient chooses bed at [facility] [place]     Physician recommends and patient chooses bed at      Patient to be transferred to [facility] [place] on 09/28/16.  Patient to be transferred to facility by [facility]     Patient family notified on 09/28/16 of transfer.  Name of family member notified:  Windell Hummingbird, Daughter     PHYSICIAN       Additional Comment:     _______________________________________________ Abigail Butts, LCSW 09/28/2016, 2:43 PM

## 2016-09-28 NOTE — Discharge Instructions (Signed)

## 2016-09-28 NOTE — Progress Notes (Signed)
Patient will discharge to [facility] [place] Anticipated discharge date: 09/28/16 Family notified: Windell Hummingbird, daughter Transportation by: Sharin Mons   CSW signing off.  Abigail Butts, LCSWA  Clinical Social Worker

## 2016-09-28 NOTE — Discharge Summary (Signed)
Physician Discharge Summary  Alexis Yates ZOX:[id number] DOB: Nov 15, 1927 DOA: 09/22/2016  PCP: Marden Noble, MD  Admit date: 09/22/2016 Discharge date: 09/28/2016  Admitted From: Home Disposition: SNF  Recommendations for Outpatient Follow-up:  1. Follow up with PCP in 1 week 2. Pulmonology follow-up 3. Outpatient pulmonary function testing 4. New medications: Lasix, Duoneb 5. Modified medications: decreased oxymorphone and changed sig of oxycodone. Discontinued metoprolol secondary to low blood pressure 6. Please obtain BMP/CBC in one week to recheck sodium and hemoglobin 7. Please follow up on the following pending results: None  Home Health: SNF Equipment/Devices: SNF  Discharge Condition: Stable CODE STATUS: Full code Diet recommendation: Heart healthy   Brief/Interim Summary:  Admission HPI written by Narda Bonds, MD    HPI:   Alexis Yates  is a 83 y.o. female,  CHF (60-65), MI 01/07/2016, s/p stent apparently feel backwards and presented for fall to ED.   Pt found to have slightly low pox in ED.  Pt noted dyspnea x 4 days and increasing edema as well as 10 lbs weight gain over the past 3 months.  Pt denies fever, chills, cp, palp, n/v, diarrhea, brbpr, black stool.    In ED, CT brain negative, CTA chest=> negative for PE,  Mild emphysema, no focal consolidation or pleural effusion.  Multiple small pulmonary nodules, will need follow up.  Xray no evidence of sacral or coccy fracture.  BNP=>398.8, trop -I  0.01   Pt will be admitted for hypoxia, ?CHF.     Hospital course:  Dyspnea Initially thought to be secondary to mild CHF exacerbation. Patient was started on IV Lasix with good urine output. As course progress, this seemed to be an underlying primary lung component. ABG was obtained secondary to change in mental status which was significant for hypoxia in addition to hypercarbia. She was started on BiPAP which improved hypercarbia. Pulmonology consulted and  recommended outpatient workup with pulmonary function testing. Patient was dyspnea. Prior to discharge. She'll be discharged on oxygen via nasal cannula.   Acute on chronic diastolic heart failure Treated with Lasix. Echocardiogram obtained on 09/25/2016 was significant for an EF range of 65-70% with no regional wall motion abnormalities and grade 1 diastolic dysfunction. Also significant was pulmonary hypertension with a peak pressure of 49 mmHg.  Acute respiratory failure with hypoxia and hypercapnia Respiratory acidosis Patient required Bipap for CO2 retention and acidemia. This is likely secondary to underlying lung disease in addition to hypoventilation from excess narcotic usage. Narcotics were adjusted with improvement in mentation in addition to the use of BiPAP. BiPAP was discontinued prior to discharge. Outpatient pulmonology follow-up.  Confusion Secondary to hypercarbia. Treated with BiPAP and medication adjustment.  Hyperglycemia Hemoglobin A1C of 6.4. Patient may benefit from metformin. Can be discussed as an outpatient.  CAD Continued Plavix and Lipitor. Metoprolol discontinued at discharge secondary to low blood pressures.  Anemia Stable. Outpatient management.  Pulmonary nodules Remote smoking history. Patient made aware. Outpatient follow-up.  Weight gain Possibly secondary to component of heart failure exacerbation.   Discharge Diagnoses:  Principal Problem:   Acute on chronic diastolic CHF (congestive heart failure) (HCC) Active Problems:   Coronary artery disease involving native coronary artery of native heart with angina pectoris (HCC)   DOE (dyspnea on exertion)   CHF (congestive heart failure) (HCC)   Dyspnea   Acute respiratory failure with hypoxia and hypercapnia [facility])    Discharge Instructions  Discharge Instructions    Increase activity slowly    Complete  by:  As directed      Allergies as of 09/28/2016      Reactions   Penicillins  Anaphylaxis   Has patient had a PCN reaction causing immediate rash, facial/tongue/throat swelling, SOB or lightheadedness with hypotension: Yes Has patient had a PCN reaction causing severe rash involving mucus membranes or skin necrosis: No Has patient had a PCN reaction that required hospitalization Yes Has patient had a PCN reaction occurring within the last 10 years: No If all of the above answers are "NO", then may proceed with Cephalosporin use.   Phenergan [promethazine Hcl] Other (See Comments)   Restless legs      Medication List    STOP taking these medications   ALPRAZolam 0.25 MG tablet Commonly known as:  XANAX   aspirin 81 MG EC tablet   celecoxib 200 MG capsule Commonly known as:  CELEBREX   levofloxacin 500 MG tablet Commonly known as:  LEVAQUIN   metoprolol tartrate 25 MG tablet Commonly known as:  LOPRESSOR     TAKE these medications   atorvastatin 40 MG tablet Commonly known as:  LIPITOR Take 1 tablet by mouth daily.   bimatoprost 0.01 % Soln Commonly known as:  LUMIGAN Place 1 drop into both eyes at bedtime.   clopidogrel 75 MG tablet Commonly known as:  PLAVIX Take 1 tablet (75 mg total) by mouth daily.   docusate sodium 100 MG capsule Commonly known as:  COLACE Take 100 mg by mouth 2 (two) times daily.   dorzolamide-timolol 22.3-6.8 MG/ML ophthalmic solution Commonly known as:  COSOPT Place 1 drop into both eyes 2 (two) times daily.   esomeprazole 40 MG capsule Commonly known as:  NEXIUM Take 40 mg by mouth daily.   furosemide 20 MG tablet Commonly known as:  LASIX Take 0.5 tablets (10 mg total) by mouth daily.   ipratropium-albuterol 0.5-2.5 (3) MG/3ML Soln Commonly known as:  DUONEB Take 3 mLs by nebulization every 6 (six) hours as needed.   OLANZapine-FLUoxetine 3-25 MG capsule Commonly known as:  SYMBYAX Take 1 capsule by mouth every evening.   oxyCODONE 5 MG immediate release tablet Commonly known as:  Oxy  IR/ROXICODONE Take 1 tablet (5 mg total) by mouth 3 (three) times daily as needed for severe pain. What changed:  when to take this  reasons to take this   Oxymorphone ER (Crush Resist) 7.5 MG T12a Take 7.5 mg by mouth every 12 (twelve) hours. What changed:  medication strength  how much to take   pilocarpine 1 % ophthalmic solution Commonly known as:  PILOCAR Place 1 drop into both eyes 4 (four) times daily.   pramipexole 0.25 MG tablet Commonly known as:  MIRAPEX Take 0.25-0.5 mg by mouth at bedtime.   RHOPRESSA 0.02 % Soln Generic drug:  Netarsudil Dimesylate Place 1 drop into both eyes at bedtime.   senna 8.6 MG Tabs tablet Commonly known as:  SENOKOT Take 1-2 tablets (8.6-17.2 mg total) by mouth at bedtime. What changed:  how much to take  when to take this  reasons to take this   SYSTANE 0.4-0.3 % Soln Generic drug:  Polyethyl Glycol-Propyl Glycol Place 1 drop into both eyes as needed (for dry eyes).       Contact information for follow-up providers    Coralyn Helling, MD Follow up on 10/01/2016.   Specialty:  Pulmonary Disease Why:  2:45 PM - [facility] Contact information: [address] [place] [address] [phone]  Contact information for after-discharge care    Destination    [facility] Follow up.   Specialty:  Skilled Nursing Facility Contact information: [address] [place] [address] [phone]                 Allergies  Allergen Reactions  . Penicillins Anaphylaxis    Has patient had a PCN reaction causing immediate rash, facial/tongue/throat swelling, SOB or lightheadedness with hypotension: Yes Has patient had a PCN reaction causing severe rash involving mucus membranes or skin necrosis: No Has patient had a PCN reaction that required hospitalization Yes Has patient had a PCN reaction occurring within the last 10 years: No If all of the above  answers are "NO", then may proceed with Cephalosporin use.   Marland Kitchen Phenergan [Promethazine Hcl] Other (See Comments)    Restless legs    Consultations:  Pulmonology   Procedures/Studies: Dg Chest 2 View  Result Date: 09/22/2016 CLINICAL DATA:  Shortness of breath. Bilateral foot swelling. Fall today. EXAM: CHEST  2 VIEW COMPARISON:  Chest radiograph 04/12/2016 FINDINGS: The cardiomediastinal contours are unchanged with aortic atherosclerosis and tortuosity of the thoracic aorta. Pulmonary vasculature is normal. No consolidation, pleural effusion, or pneumothorax. Moderate scoliosis of the thoracic spine. Post right shoulder arthroplasty. Chronic left shoulder deformity. Limited evaluation of the thoracic spine on lateral view due to bony under mineralization and scoliosis. IMPRESSION: 1. No acute abnormality. 2. Thoracic aortic atherosclerosis. 3. Unchanged scoliosis and chronic change of the left proximal humerus. Electronically Signed   By: Rubye Oaks M.D.   On: 09/22/2016 21:30   Dg Sacrum/coccyx  Result Date: 09/23/2016 CLINICAL DATA:  83 year old post fall striking tailbone with ataxia. EXAM: SACRUM AND COCCYX - 2+ VIEW COMPARISON:  CT 02/29/2016 FINDINGS: There is no evidence of fracture or other focal bone lesions. Degree of bony under mineralization limits assessment. Sacroiliac joints are congruent. Gluteal calcifications as seen on prior CT. IMPRESSION: No evidence of sacral or coccyx fracture allowing for limitations secondary to osteopenia/osteoporosis. Electronically Signed   By: Rubye Oaks M.D.   On: 09/23/2016 00:38   Ct Head Wo Contrast  Result Date: 09/23/2016 CLINICAL DATA:  Fall yesterday and 1 week ago.  Ataxia. EXAM: CT HEAD WITHOUT CONTRAST TECHNIQUE: Contiguous axial images were obtained from the base of the skull through the vertex without intravenous contrast. COMPARISON:  None. FINDINGS: Brain: Generalized atrophy, normal for age. Mild chronic small vessel  ischemia. No intracranial hemorrhage, mass effect, or midline shift. No hydrocephalus. The basilar cisterns are patent. No evidence of territorial infarct. No intracranial fluid collection. Vascular: No hyperdense vessel. Skull: No skull fracture.  No focal lesion. Sinuses/Orbits: Paranasal sinuses and mastoid air cells are clear. The visualized orbits are unremarkable. Bilateral injured placement. Streak artifact from dental hardware. Other: None. IMPRESSION: 1.  No acute intracranial abnormality.  No skull fracture. 2. Age related atrophy with mild chronic small vessel ischemia. Electronically Signed   By: Rubye Oaks M.D.   On: 09/23/2016 01:06   Ct Angio Chest Pe W/cm &/or Wo Cm  Result Date: 09/23/2016 CLINICAL DATA:  Dyspnea and hypoxia with leg swelling EXAM: CT ANGIOGRAPHY CHEST WITH CONTRAST TECHNIQUE: Multidetector CT imaging of the chest was performed using the standard protocol during bolus administration of intravenous contrast. Multiplanar CT image reconstructions and MIPs were obtained to evaluate the vascular anatomy. CONTRAST:  80 mL Isovue 370 intravenous COMPARISON:  Radiograph 09/22/2016 FINDINGS: Cardiovascular: Satisfactory opacification of the pulmonary arteries to the segmental  level. No evidence of pulmonary embolism. Aortic atherosclerosis. Ectatic ascending aorta measuring up to 3.6 cm in diameter. Mild cardiomegaly. Coronary artery calcification. No significant pericardial effusion. Mediastinum/Nodes: Midline trachea. No thyroid mass. No significant mediastinal adenopathy. The esophagus is within normal limits. Lungs/Pleura: Mild emphysema. No focal consolidation or pleural effusion. Scattered bilateral pulmonary nodules, for example left upper lobe 3 mm pulmonary nodule, series 6, image number 33. , subpleural right lower lobe pulmonary nodule measuring 5 mm, series 6, image number 62. Upper Abdomen: No acute abnormality. Musculoskeletal: Moderate compression deformity of T8  with estimated 50% loss of height anteriorly. This is chronic compared to prior radiographs. Review of the MIP images confirms the above findings. IMPRESSION: 1. Negative for acute pulmonary embolus. 2. Mild emphysema. Multiple small pulmonary nodules. No follow-up needed if patient is low-risk (and has no known or suspected primary neoplasm). Non-contrast chest CT can be considered in 12 months if patient is high-risk. This recommendation follows the consensus statement: Guidelines for Management of Incidental Pulmonary Nodules Detected on CT Images: From the Fleischner Society 2017; Radiology 2017; 284:228-243. Aortic Atherosclerosis (ICD10-I70.0) and Emphysema (ICD10-J43.9). Electronically Signed   By: Jasmine Pang M.D.   On: 09/23/2016 01:19   Dg Chest Port 1 View  Result Date: 09/26/2016 CLINICAL DATA:  Hypoxia and hypercarbia. EXAM: PORTABLE CHEST 1 VIEW COMPARISON:  09/24/2016. FINDINGS: Stable enlarged cardiac silhouette and tortuous and calcified thoracic aorta. Stable mild prominence of the interstitial markings and mild diffuse peribronchial thickening. Right shoulder prosthesis and superior migration of both humeral heads, including the prosthesis on the right, with associated extensive bony remodeling as well as left shoulder degenerative changes. IMPRESSION: 1. No acute abnormality. 2. Stable cardiomegaly and mild chronic interstitial lung disease. 3. Chronic, large bilateral rotator cuff tears with a right shoulder prosthesis and left shoulder degenerative changes noted. Electronically Signed   By: Beckie Salts M.D.   On: 09/26/2016 10:06   Dg Chest Port 1 View  Result Date: 09/24/2016 CLINICAL DATA:  Shortness of Breath EXAM: PORTABLE CHEST 1 VIEW COMPARISON:  09/22/2016 FINDINGS: There is hyperinflation of the lungs compatible with COPD. Cardiomegaly. No confluent airspace opacities or effusions. Aortic atherosclerosis. No acute bony abnormality. Prior right shoulder replacement.  IMPRESSION: Cardiomegaly.  Aortic atherosclerosis.  No active disease. Electronically Signed   By: Charlett Nose M.D.   On: 09/24/2016 11:01   Dg Hand Complete Right  Result Date: 09/22/2016 CLINICAL DATA:  Right hand pain and bruising after fall. EXAM: RIGHT HAND - COMPLETE 3+ VIEW COMPARISON:  None. FINDINGS: No acute fracture or dislocation. Osteoarthritis of the digits, with possible Gull wing deformity suggesting an inflammatory osteoarthritis component. Small radial subluxation of the thumb distal phalanx at the interphalangeal joint appears chronic. Volar subluxation of the second through fourth proximal phalanges at the metacarpal phalangeal joint. Cystic changes in the ulna styloid. The bones are diffusely under mineralized. IMPRESSION: 1. No acute fracture or dislocation of the right hand. 2. 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 Volar subluxation of the digits at the metacarpal phalangeal joint suggests underlying inflammatory arthropathy. Electronically Signed   By: Rubye Oaks M.D.   On: 09/22/2016 21:32    Echocardiogram (09/25/16)  Study Conclusions  - Left ventricle: The cavity size was normal. There was moderate   concentric hypertrophy. Systolic function was vigorous. The   estimated ejection fraction was in the range of 65% to 70%. Wall   motion was normal; there were no regional wall motion  abnormalities. Doppler parameters are consistent with abnormal   left ventricular relaxation (grade 1 diastolic dysfunction). Peak   outflow tract velocity is 3.52m/s. Hyperdynamic contraction. - Aortic valve: There was trivial regurgitation. No stenosis. - Mitral valve: Moderately calcified annulus. There was systolic   anterior motion. Valve area by pressure half-time: 1.75 cm^2. - Tricuspid valve: There was mild regurgitation. - Pulmonary arteries: Systolic pressure was mildly to moderately   increased. PA peak pressure: 49 mm Hg  (S). - Pericardium, extracardiac: There was a left pleural effusion.  Impressions:  - No significant change from prior study (dynamic outflow tract   obstruction noted previously)   Subjective: Patient reports no dyspnea or chest pain.  Discharge Exam: Vitals:   09/27/16 1955 09/28/16 0405  BP: 129/62 (!) 91/48  Pulse: 85 79  Resp: (!) 23 17  Temp: 98.4 F (36.9 C) 97.9 F (36.6 C)   Vitals:   09/27/16 2021 09/28/16 0405 09/28/16 0943 09/28/16 1235  BP:  (!) 91/48    Pulse:  79    Resp:  17    Temp:  97.9 F (36.6 C)    TempSrc:  Oral    SpO2: 99% 100% 97% 95%  Weight:  57.8 kg (127 lb 6.4 oz)    Height:        General: Pt is alert, awake, not in acute distress Cardiovascular: RRR, systolic murmur, L9/F7 +, no rubs, no gallops Respiratory: lower lobe crackles.bilaterally, no wheezing, no rhonchi Abdominal: Soft, NT, ND, bowel sounds + Extremities: no edema, no cyanosis    The results of significant diagnostics from this hospitalization (including imaging, microbiology, ancillary and laboratory) are listed below for reference.     Microbiology: Recent Results (from the past 240 hour(s))  MRSA PCR Screening     Status: None   Collection Time: 09/23/16  7:04 PM  Result Value Ref Range Status   MRSA by PCR NEGATIVE NEGATIVE Final    Comment:        The GeneXpert MRSA Assay (FDA approved for NASAL specimens only), is one component of a comprehensive MRSA colonization surveillance program. It is not intended to diagnose MRSA infection nor to guide or monitor treatment for MRSA infections.      Labs: BNP (last 3 results)  Recent Labs  04/12/16 1150 09/22/16 2050  BNP 94.3 398.8*   Basic Metabolic Panel:  Recent Labs Lab 09/22/16 2050 09/23/16 1143 09/25/16 0406  NA 135 138 133*  K 4.4 4.0 4.3  CL 102 98* 96*  CO2 26 31 31   GLUCOSE 120* 162* 115*  BUN 16 14 16   CREATININE 0.85 0.98 1.00  CALCIUM 9.0 9.2 8.8*   Liver Function  Tests:  Recent Labs Lab 09/23/16 1143  AST 29  ALT 20  ALKPHOS 96  BILITOT 0.6  PROT 6.3*  ALBUMIN 3.4*   No results for input(s): LIPASE, AMYLASE in the last 168 hours. No results for input(s): AMMONIA in the last 168 hours. CBC:  Recent Labs Lab 09/22/16 2050 09/23/16 1143  WBC 11.4* 9.2  HGB 10.5* 11.1*  HCT 34.0* 36.4  MCV 85.9 87.5  PLT 236 235   Cardiac Enzymes:  Recent Labs Lab 09/23/16 1143 09/23/16 1813 09/24/16 0119  TROPONINI <0.03 <0.03 <0.03   BNP: Invalid input(s): POCBNP CBG: No results for input(s): GLUCAP in the last 168 hours. D-Dimer No results for input(s): DDIMER in the last 72 hours. Hgb A1c No results for input(s): HGBA1C in the last 72 hours. Lipid Profile No  results for input(s): CHOL, HDL, LDLCALC, TRIG, CHOLHDL, LDLDIRECT in the last 72 hours. Thyroid function studies No results for input(s): TSH, T4TOTAL, T3FREE, THYROIDAB in the last 72 hours.  Invalid input(s): FREET3 Anemia work up No results for input(s): VITAMINB12, FOLATE, FERRITIN, TIBC, IRON, RETICCTPCT in the last 72 hours. Urinalysis    Component Value Date/Time   COLORURINE STRAW (A) 09/23/2016 0055   APPEARANCEUR CLEAR 09/23/2016 0055   LABSPEC 1.008 09/23/2016 0055   PHURINE 6.0 09/23/2016 0055   GLUCOSEU NEGATIVE 09/23/2016 0055   HGBUR NEGATIVE 09/23/2016 0055   BILIRUBINUR NEGATIVE 09/23/2016 0055   KETONESUR NEGATIVE 09/23/2016 0055   PROTEINUR NEGATIVE 09/23/2016 0055   UROBILINOGEN 0.2 05/27/2014 1122   NITRITE NEGATIVE 09/23/2016 0055   LEUKOCYTESUR MODERATE (A) 09/23/2016 0055   Sepsis Labs Invalid input(s): PROCALCITONIN,  WBC,  LACTICIDVEN Microbiology Recent Results (from the past 240 hour(s))  MRSA PCR Screening     Status: None   Collection Time: 09/23/16  7:04 PM  Result Value Ref Range Status   MRSA by PCR NEGATIVE NEGATIVE Final    Comment:        The GeneXpert MRSA Assay (FDA approved for NASAL specimens only), is one component of  a comprehensive MRSA colonization surveillance program. It is not intended to diagnose MRSA infection nor to guide or monitor treatment for MRSA infections.      Time coordinating discharge: Over 30 minutes  SIGNED:   Jacquelin Hawking, MD [facility] 09/28/2016, 2:24 PM Pager [phone]  If 7PM-7AM, please contact night-coverage [email or web address] Password [email or web address]

## 2016-10-01 ENCOUNTER — Ambulatory Visit (INDEPENDENT_AMBULATORY_CARE_PROVIDER_SITE_OTHER): Payer: Medicare Other | Admitting: Pulmonary Disease

## 2016-10-01 ENCOUNTER — Encounter: Payer: Self-pay | Admitting: Pulmonary Disease

## 2016-10-01 VITALS — BP 124/84 | HR 94 | Ht 60.0 in | Wt 125.2 lb

## 2016-10-01 DIAGNOSIS — J984 Other disorders of lung: Secondary | ICD-10-CM | POA: Diagnosis not present

## 2016-10-01 DIAGNOSIS — M419 Scoliosis, unspecified: Secondary | ICD-10-CM | POA: Diagnosis not present

## 2016-10-01 DIAGNOSIS — J9611 Chronic respiratory failure with hypoxia: Secondary | ICD-10-CM

## 2016-10-01 DIAGNOSIS — J9612 Chronic respiratory failure with hypercapnia: Secondary | ICD-10-CM | POA: Diagnosis not present

## 2016-10-01 DIAGNOSIS — R918 Other nonspecific abnormal finding of lung field: Secondary | ICD-10-CM | POA: Diagnosis not present

## 2016-10-01 DIAGNOSIS — J432 Centrilobular emphysema: Secondary | ICD-10-CM | POA: Diagnosis not present

## 2016-10-01 NOTE — Patient Instructions (Signed)
Will arrange for portable oxygen meter >> goal is to keep oxygen level above 90%  Will arrange for in lab sleep study and call with results  Follow up in 3 months

## 2016-10-01 NOTE — Progress Notes (Signed)
Current Outpatient Prescriptions on File Prior to Visit  Medication Sig  . atorvastatin (LIPITOR) 40 MG tablet Take 1 tablet by mouth daily.  . bimatoprost (LUMIGAN) 0.01 % SOLN Place 1 drop into both eyes at bedtime.   . clopidogrel (PLAVIX) 75 MG tablet Take 1 tablet (75 mg total) by mouth daily.  Marland Kitchen docusate sodium (COLACE) 100 MG capsule Take 100 mg by mouth 2 (two) times daily.   . dorzolamide-timolol (COSOPT) 22.3-6.8 MG/ML ophthalmic solution Place 1 drop into both eyes 2 (two) times daily.  Marland Kitchen esomeprazole (NEXIUM) 40 MG capsule Take 40 mg by mouth daily.  . furosemide (LASIX) 20 MG tablet Take 0.5 tablets (10 mg total) by mouth daily.  Marland Kitchen ipratropium-albuterol (DUONEB) 0.5-2.5 (3) MG/3ML SOLN Take 3 mLs by nebulization every 6 (six) hours as needed.  Heywood Iles Dimesylate (RHOPRESSA) 0.02 % SOLN Place 1 drop into both eyes at bedtime.  Marland Kitchen OLANZapine-FLUoxetine (SYMBYAX) 3-25 MG capsule Take 1 capsule by mouth every evening.  Marland Kitchen oxyCODONE (OXY IR/ROXICODONE) 5 MG immediate release tablet Take 1 tablet (5 mg total) by mouth 3 (three) times daily as needed for severe pain.  Marland Kitchen oxymorphone 7.5 MG PO T12A Take 7.5 mg by mouth every 12 (twelve) hours.  . pilocarpine (PILOCAR) 1 % ophthalmic solution Place 1 drop into both eyes 4 (four) times daily.  Bertram Gala Glycol-Propyl Glycol (SYSTANE) 0.4-0.3 % SOLN Place 1 drop into both eyes as needed (for dry eyes).  . pramipexole (MIRAPEX) 0.25 MG tablet Take 0.25-0.5 mg by mouth at bedtime.   . senna (SENOKOT) 8.6 MG TABS tablet Take 1-2 tablets (8.6-17.2 mg total) by mouth at bedtime.   No current facility-administered medications on file prior to visit.      Chief Complaint  Patient presents with  . Hospitalization Follow-up    Seen in [facility] 7/11-7/17 for increased SOB. Pt states that her breathing has been well since d/c. Pt uses O2 2 liters.      Pulmonary tests CT angio chest 09/23/16 >> mild emphysema, small nodules, scoliosis Spirometry  09/28/16 >> FEV1 0.68 (45%), FVC 0.99 (47%), FEV1% 69  Cardiac tests Echo 09/25/16 >> EF 65 to 70%, grade 1 DD, PAS 49 mmHg  Past medical history CAD, Diverticulosis, GERD, Glaucoma, HH, HTN  Past surgical history, Family history, Social history, Allergies all reviewed.  Vital Signs BP 124/84 (BP Location: Left Arm, Cuff Size: Normal)   Pulse 94   Ht 5' (1.524 m)   Wt 125 lb 3.2 oz (56.8 kg)   SpO2 96%   BMI 24.45 kg/m   History of Present Illness Alexis Yates is a 83 y.o. female former smoker with chronic hypoxic/hypercapnic respiratory failure with restrictive lung disease from kyphoscoliosis and mild COPD with emphysema.  She has been using 2 liters oxygen since being in the hospital.  She has noticed any difference since using oxygen.  She doesn't feel like she has trouble with her breathing while asleep.  She was diagnosed with scoliosis at age 60.  This has gotten progressively worse.  She feels this is the main culprit with her breathing issues.  She has been working with a physical therapist to help with her strength and stretching exercises.  She was started on nebulizer therapy in the hospital.  She doesn't feel like this has helped much at all.  She does not have cough, wheeze, or sputum.  Her oxygen level was adequate at rest.  She did have oxygen level drop with minimal exertion  today.   Physical Exam  General - pleasant, wearing oxygen, sitting in wheelchair Eyes - pupils reactive ENT - no sinus tenderness, no oral exudate, no LAN Cardiac - regular, no murmur Chest - no wheeze, rales Back - moderate kyphoscoliosis Abd - soft, non tender Ext - no edema Skin - no rashes Neuro - normal strength Psych - normal mood   CMP Latest Ref Rng & Units 09/25/2016 09/23/2016 09/22/2016  Glucose 65 - 99 mg/dL 161(W) 960(A) 540(J)  BUN 6 - 20 mg/dL 16 14 16   Creatinine 0.44 - 1.00 mg/dL 8.11 9.14 7.82  Sodium 135 - 145 mmol/L 133(L) 138 135  Potassium 3.5 - 5.1 mmol/L  4.3 4.0 4.4  Chloride 101 - 111 mmol/L 96(L) 98(L) 102  CO2 22 - 32 mmol/L 31 31 26   Calcium 8.9 - 10.3 mg/dL 9.5(A) 9.2 9.0  Total Protein 6.5 - 8.1 g/dL - 6.3(L) -  Total Bilirubin 0.3 - 1.2 mg/dL - 0.6 -  Alkaline Phos 38 - 126 U/L - 96 -  AST 15 - 41 U/L - 29 -  ALT 14 - 54 U/L - 20 -    CBC Latest Ref Rng & Units 09/23/2016 09/22/2016 07/08/2016  WBC 4.0 - 10.5 K/uL 9.2 11.4(H) 8.9  Hemoglobin 12.0 - 15.0 g/dL 11.1(L) 10.5(L) 10.7(L)  Hematocrit 36.0 - 46.0 % 36.4 34.0(L) 33.9(L)  Platelets 150 - 400 K/uL 235 236 361    ABG    Component Value Date/Time   PHART 7.339 (L) 09/26/2016 0900   PCO2ART 59.4 (H) 09/26/2016 0900   PO2ART 69.5 (L) 09/26/2016 0900   HCO3 31.2 (H) 09/26/2016 0900   TCO2 27 03/28/2016 0048   O2SAT 93.6 09/26/2016 0900    Discussion She has chronic hypoxic and hypercapnic respiratory failure.  I think her respiratory issues are mostly related to restrictive lung disease from kyphoscoliosis.  She also has obstructive lung disease with mild changes of emphysema on CT chest.  However, she doesn't have significant symptoms to go along with obstructive lung disease, and hasn't notice symptomatic benefit from nebulizer therapy.  Assessment/Plan  Chronic hypoxic, hypercapnic respiratory failure. - okay to not use oxygen at rest - will arrange for portable pulse oximeter - she should continue 2 liters oxygen with exertion - will arrange for in lab sleep study to assess for sleep disordered breathing and then determine if she should continue nocturnal oxygen +/- PAP therapy - discussed symptoms/signs to monitor for that would indicate progression of her respiratory failure  Restrictive lung disease with kyphoscoliosis. - continue to work with physical therapy  Mild COPD with emphysema. - she can use prn duoneb if she notices symptomatic benefit  Lung nodules. - monitor clinically and then determine if she needs repeat imaging studies if she gets  progressive symptoms  WHO group 2 and 3 pulmonary hypertension. - continue supplemental oxygen and assess for PAP therapy for sleep disordered breathing   Patient Instructions  Will arrange for portable oxygen meter >> goal is to keep oxygen level above 90%  Will arrange for in lab sleep study and call with results  Follow up in 3 months    Coralyn Helling, MD [place] [facility]/Critical Care/Sleep Pager:  [phone] 10/01/2016, 3:45 PM

## 2016-12-01 ENCOUNTER — Ambulatory Visit (HOSPITAL_BASED_OUTPATIENT_CLINIC_OR_DEPARTMENT_OTHER): Payer: Medicare Other | Attending: Pulmonary Disease | Admitting: Pulmonary Disease

## 2016-12-01 VITALS — Ht 59.0 in | Wt 124.0 lb

## 2016-12-01 DIAGNOSIS — G4733 Obstructive sleep apnea (adult) (pediatric): Secondary | ICD-10-CM

## 2016-12-01 DIAGNOSIS — I509 Heart failure, unspecified: Secondary | ICD-10-CM | POA: Insufficient documentation

## 2016-12-01 DIAGNOSIS — R0683 Snoring: Secondary | ICD-10-CM | POA: Insufficient documentation

## 2016-12-01 DIAGNOSIS — I110 Hypertensive heart disease with heart failure: Secondary | ICD-10-CM | POA: Diagnosis not present

## 2016-12-01 DIAGNOSIS — J984 Other disorders of lung: Secondary | ICD-10-CM

## 2016-12-01 DIAGNOSIS — R5383 Other fatigue: Secondary | ICD-10-CM | POA: Diagnosis not present

## 2016-12-01 DIAGNOSIS — J432 Centrilobular emphysema: Secondary | ICD-10-CM

## 2016-12-01 DIAGNOSIS — J9612 Chronic respiratory failure with hypercapnia: Secondary | ICD-10-CM

## 2016-12-01 DIAGNOSIS — J9611 Chronic respiratory failure with hypoxia: Secondary | ICD-10-CM

## 2016-12-01 DIAGNOSIS — M419 Scoliosis, unspecified: Secondary | ICD-10-CM

## 2016-12-01 DIAGNOSIS — G4761 Periodic limb movement disorder: Secondary | ICD-10-CM

## 2016-12-12 ENCOUNTER — Telehealth: Payer: Self-pay | Admitting: Pulmonary Disease

## 2016-12-12 DIAGNOSIS — G4761 Periodic limb movement disorder: Secondary | ICD-10-CM | POA: Diagnosis not present

## 2016-12-12 DIAGNOSIS — G4733 Obstructive sleep apnea (adult) (pediatric): Secondary | ICD-10-CM | POA: Diagnosis not present

## 2016-12-12 NOTE — Procedures (Signed)
       Patient Name: Alexis Yates, Alexis Yates Date: 12/01/2016   Gender: Female  D.O.B: 09/04/1927  Age (years): 63  Referring Provider: Coralyn Helling MD, ABSM  Height (inches): 59  Interpreting Physician: Coralyn Helling MD, ABSM  Weight (lbs): 124  RPSGT: Shelah Lewandowsky   BMI: 25  MRN: [id number]  Neck Size: 13.50    CLINICAL INFORMATION  Sleep Study Type: NPSG  Indication for sleep study: Congestive Heart Failure, Daytime Fatigue, Depression, Fatigue, Hypertension, Snoring  Epworth Sleepiness Score: 9  SLEEP STUDY TECHNIQUE  As per the AASM Manual for the Scoring of Sleep and Associated Events v2.3 (April 2016) with a hypopnea requiring 4% desaturations.  The channels recorded and monitored were frontal, central and occipital EEG, electrooculogram (EOG), submentalis EMG (chin), nasal and oral airflow, thoracic and abdominal wall motion, anterior tibialis EMG, snore microphone, electrocardiogram, and pulse oximetry.  MEDICATIONS  Medications self-administered by patient taken the night of the study : FUROSEMIDE, CLOPIDOGREL, ESOMEPRAZOLE MAGNESIUM, SYMBYAX, GABAPENTIN, SENNOSIDES, Oxymorphone, METOPROLOL TARTRATE, COSOPT, PILOCARPINE, BIMATOPROST, XANAX, SYSTANE  SLEEP ARCHITECTURE  The study was initiated at 10:00:29 PM and ended at 4:42:59 AM.  Sleep onset time was 85.5 minutes and the sleep efficiency was 69.3%. The total sleep time was 279.0 minutes.  Stage REM latency was 177.0 minutes.  The patient spent 15.05% of the night in stage N1 sleep, 81.36% in stage N2 sleep, 0.00% in stage N3 and 3.58% in REM.  Alpha intrusion was absent.  Supine sleep was 100.00%.  RESPIRATORY PARAMETERS  The overall apnea/hypopnea index (AHI) was 22.2 per hour. There were 42 total apneas, including 38 obstructive, 4 central and 0 mixed apneas. There were 61 hypopneas and 24 RERAs.  The AHI during Stage REM sleep was 6.0 per hour. AHI while supine was 22.2 per hour.  The mean oxygen saturation  was 91.61%. The minimum SpO2 during sleep was 86.00%.  moderate snoring was noted during this study. CARDIAC DATA  The 2 lead EKG demonstrated sinus rhythm. The mean heart rate was 61.56 beats per minute. Other EKG findings include: PVCs.  LEG MOVEMENT DATA  The total PLMS were 130 with a resulting PLMS index of 27.96. Associated arousal with leg movement index was 2.4 . IMPRESSIONS  - Moderate obstructive sleep apnea occurred during this study (AHI = 22.2/h).  - No significant central sleep apnea occurred during this study (CAI = 0.9/h).  - Mild oxygen desaturation was noted during this study (Min O2 = 86.00%).  - The patient snored with moderate snoring volume.  - EKG findings include PVCs.  - Moderate periodic limb movements of sleep occurred during the study. No significant associated arousals. DIAGNOSIS  - Obstructive Sleep Apnea (G47.33)  - Periodic Limb Movements of Sleep (G47.61) RECOMMENDATIONS  - She should return to the sleep lab for a CPAP titration study.  - She should be assessed for the presence of restless leg syndrome. [Electronically signed] 12/12/2016 05:08 PM Coralyn Helling MD, ABSM  Diplomate, American Board of Sleep Medicine  NPI: [id number]

## 2016-12-12 NOTE — Telephone Encounter (Signed)
PSG 12/01/16 >> AHI 22.2, SpO2 low 86%, PLMI 27.96   Will have my nurse inform pt that sleep study shows moderate sleep apnea.  Options are 1) in lab CPAP titration now, 2) ROV first.  If She is agreeable to CPAP titration, then please place order.  Will call her with results and start CPAP and then determine if she needs oxygen at night with CPAP also.

## 2016-12-13 NOTE — Telephone Encounter (Signed)
Called and spoke with pt's daughter Alexis Yates regarding sleep study results per VS. Pt is not willing to go through with in lab CPAP titration test at this time. Pt is refusing any nasal or mask around her nose or face anytime. Pt's daughter verbalized understanding and denied any questions at this time.  Will route this message and concern with VS to see what to do at this time.

## 2016-12-13 NOTE — Telephone Encounter (Signed)
Called and spoke with pt's daughter to schedule follow up visit with pt per SV request to go over the results of sleep study more in depth. Appt is scheduled for 12/31/16 at 11am. Pt's daughter verbalized understanding and denied any additional questions or concerns at this time.

## 2016-12-13 NOTE — Telephone Encounter (Signed)
Please schedule follow up with me so I can discuss results in more detail with pt.

## 2016-12-31 ENCOUNTER — Telehealth: Payer: Self-pay | Admitting: Pulmonary Disease

## 2016-12-31 ENCOUNTER — Ambulatory Visit (INDEPENDENT_AMBULATORY_CARE_PROVIDER_SITE_OTHER): Payer: Medicare Other | Admitting: Pulmonary Disease

## 2016-12-31 ENCOUNTER — Encounter: Payer: Self-pay | Admitting: Pulmonary Disease

## 2016-12-31 VITALS — BP 112/68 | HR 68 | Ht 60.0 in | Wt 120.0 lb

## 2016-12-31 DIAGNOSIS — J432 Centrilobular emphysema: Secondary | ICD-10-CM | POA: Diagnosis not present

## 2016-12-31 DIAGNOSIS — I272 Pulmonary hypertension, unspecified: Secondary | ICD-10-CM | POA: Diagnosis not present

## 2016-12-31 DIAGNOSIS — J984 Other disorders of lung: Secondary | ICD-10-CM

## 2016-12-31 DIAGNOSIS — J9612 Chronic respiratory failure with hypercapnia: Secondary | ICD-10-CM | POA: Diagnosis not present

## 2016-12-31 DIAGNOSIS — R918 Other nonspecific abnormal finding of lung field: Secondary | ICD-10-CM

## 2016-12-31 DIAGNOSIS — M419 Scoliosis, unspecified: Secondary | ICD-10-CM | POA: Diagnosis not present

## 2016-12-31 DIAGNOSIS — J9611 Chronic respiratory failure with hypoxia: Secondary | ICD-10-CM | POA: Diagnosis not present

## 2016-12-31 NOTE — Telephone Encounter (Signed)
Spoke with Barbara Cower, we needed to change the order with the template for O2. This was done and nothing further is needed. FYI PCC's

## 2016-12-31 NOTE — Patient Instructions (Signed)
Will arrange for home oxygen set up  Follow up in 6 months

## 2016-12-31 NOTE — Telephone Encounter (Signed)
I just sent staff message to [facility] to let her know order has been placed.  Nothing further needed.

## 2016-12-31 NOTE — Progress Notes (Signed)
Current Outpatient Prescriptions on File Prior to Visit  Medication Sig  . atorvastatin (LIPITOR) 40 MG tablet Take 1 tablet by mouth daily.  . bimatoprost (LUMIGAN) 0.01 % SOLN Place 1 drop into both eyes at bedtime.   . clopidogrel (PLAVIX) 75 MG tablet Take 1 tablet (75 mg total) by mouth daily.  Marland Kitchen docusate sodium (COLACE) 100 MG capsule Take 100 mg by mouth 2 (two) times daily.   . dorzolamide-timolol (COSOPT) 22.3-6.8 MG/ML ophthalmic solution Place 1 drop into both eyes 2 (two) times daily.  Marland Kitchen esomeprazole (NEXIUM) 40 MG capsule Take 40 mg by mouth daily.  . furosemide (LASIX) 20 MG tablet Take 0.5 tablets (10 mg total) by mouth daily.  Marland Kitchen OLANZapine-FLUoxetine (SYMBYAX) 3-25 MG capsule Take 1 capsule by mouth every evening.  Marland Kitchen oxyCODONE (OXY IR/ROXICODONE) 5 MG immediate release tablet Take 1 tablet (5 mg total) by mouth 3 (three) times daily as needed for severe pain.  Marland Kitchen oxymorphone 7.5 MG PO T12A Take 7.5 mg by mouth every 12 (twelve) hours.  . pilocarpine (PILOCAR) 1 % ophthalmic solution Place 1 drop into both eyes 4 (four) times daily.  Bertram Gala Glycol-Propyl Glycol (SYSTANE) 0.4-0.3 % SOLN Place 1 drop into both eyes as needed (for dry eyes).  . pramipexole (MIRAPEX) 0.25 MG tablet Take 0.25-0.5 mg by mouth at bedtime.   Marland Kitchen ipratropium-albuterol (DUONEB) 0.5-2.5 (3) MG/3ML SOLN Take 3 mLs by nebulization every 6 (six) hours as needed. (Patient not taking: Reported on 12/31/2016)   No current facility-administered medications on file prior to visit.      Chief Complaint  Patient presents with  . Follow-up    Pt is SOB w/exertion, and having some lighthead feeling when tilting head back for eye drops.     Pulmonary tests CT angio chest 09/23/16 >> mild emphysema, small nodules, scoliosis ABG 09/26/16 >> pH 7.33, PCO2 59.4, PO2 69.5 Spirometry 09/28/16 >> FEV1 0.68 (45%), FVC 0.99 (47%), FEV1% 69  Sleep tests PSG 12/01/16 >> AHI 22.2, SpO2 low 86%, PLMI 27.96  Cardiac  tests Echo 09/25/16 >> EF 65 to 70%, grade 1 DD, PAS 49 mmHg  Past medical history CAD, Diverticulosis, GERD, Glaucoma, HH, HTN  Past surgical history, Family history, Social history, Allergies all reviewed.  Vital Signs BP 112/68 (BP Location: Left Arm, Cuff Size: Normal)   Pulse 68   Ht 5' (1.524 m)   Wt 120 lb (54.4 kg)   SpO2 92%   BMI 23.44 kg/m   History of Present Illness Alexis Yates is a 83 y.o. female former smoker with chronic hypoxic/hypercapnic respiratory failure with restrictive lung disease from kyphoscoliosis and mild COPD with emphysema.  Since her last visit she had a sleep study.  This showed moderate sleep apnea.  She does not think she can wear anything on her face, and would not want to start CPAP therapy.  She doesn't feel like she has any issue with her sleep.  She doesn't have oxygen set up at home.  She was concerned about how she would use this, and wasn't sure when she would actually need it.  She is not having cough, wheeze, chest pain, sputum.  She does have ankle swelling.  SpO2 on room air at rest today 86%.  She improved to 96% while walking with 2 liters oxygen.  Physical Exam  General - pleasant Eyes - pupils reactive ENT - no sinus tenderness, no oral exudate, no LAN Cardiac - regular, no murmur Chest - no wheeze, rales  Back - moderate kyphoscoliosis Abd - soft, non tender Ext - 1+ edema Skin - no rashes Neuro - normal strength Psych - normal mood   Discussion She has chronic hypoxic and hypercapnic respiratory failure.  I think her respiratory issues are mostly related to restrictive lung disease from kyphoscoliosis.  She also has obstructive lung disease with mild changes of emphysema on CT chest.  She was also found to have obstructive sleep apnea.  She would not want to wear CPAP therapy.  She has pulmonary hypertension related to these.  I had extensive d/w her about how long oxygen levels can impact her cardiac function and  quality of life.  Assessment/Plan  Chronic hypoxic, hypercapnic respiratory failure. - will arrange for 2 liters oxygen with POC  Restrictive lung disease with kyphoscoliosis. - continue to work with physical therapy  Mild COPD with emphysema. - prn duoneb  Lung nodules. - monitor clinical - consider f/u imaging studies only if she develops new symptoms  WHO group 2 and 3 pulmonary hypertension. - discussed the importance of using supplemental oxygen   Patient Instructions  Will arrange for home oxygen set up  Follow up in 6 months  Time spent 27 minutes  Coralyn HellingVineet Wiley Magan, MD [place] [facility]/Critical Care/Sleep Pager:  [phone] 12/31/2016, 11:21 AM

## 2017-03-31 ENCOUNTER — Ambulatory Visit
Admission: RE | Admit: 2017-03-31 | Discharge: 2017-03-31 | Disposition: A | Discharge status: Home / Self Care | Payer: Medicare Other | Source: Ambulatory Visit | Attending: Internal Medicine | Admitting: Internal Medicine

## 2017-03-31 ENCOUNTER — Other Ambulatory Visit: Payer: Self-pay | Admitting: Internal Medicine

## 2017-03-31 DIAGNOSIS — R071 Chest pain on breathing: Secondary | ICD-10-CM

## 2017-03-31 DIAGNOSIS — R0600 Dyspnea, unspecified: Secondary | ICD-10-CM

## 2017-03-31 DIAGNOSIS — R091 Pleurisy: Secondary | ICD-10-CM

## 2017-04-02 ENCOUNTER — Encounter (HOSPITAL_COMMUNITY): Payer: Self-pay | Admitting: *Deleted

## 2017-04-02 ENCOUNTER — Emergency Department (HOSPITAL_COMMUNITY): Payer: Medicare Other

## 2017-04-02 ENCOUNTER — Inpatient Hospital Stay (HOSPITAL_COMMUNITY)
Admission: EM | Admit: 2017-04-02 | Discharge: 2017-04-05 | DRG: 291 | Disposition: A | Discharge status: Home Health | Payer: Medicare Other | Attending: Internal Medicine | Admitting: Internal Medicine

## 2017-04-02 DIAGNOSIS — J9601 Acute respiratory failure with hypoxia: Secondary | ICD-10-CM | POA: Diagnosis not present

## 2017-04-02 DIAGNOSIS — R0602 Shortness of breath: Secondary | ICD-10-CM | POA: Diagnosis not present

## 2017-04-02 DIAGNOSIS — N390 Urinary tract infection, site not specified: Secondary | ICD-10-CM | POA: Diagnosis present

## 2017-04-02 DIAGNOSIS — F419 Anxiety disorder, unspecified: Secondary | ICD-10-CM | POA: Diagnosis present

## 2017-04-02 DIAGNOSIS — J9621 Acute and chronic respiratory failure with hypoxia: Secondary | ICD-10-CM | POA: Diagnosis present

## 2017-04-02 DIAGNOSIS — J9622 Acute and chronic respiratory failure with hypercapnia: Secondary | ICD-10-CM | POA: Diagnosis present

## 2017-04-02 DIAGNOSIS — M419 Scoliosis, unspecified: Secondary | ICD-10-CM | POA: Diagnosis present

## 2017-04-02 DIAGNOSIS — K5900 Constipation, unspecified: Secondary | ICD-10-CM | POA: Diagnosis present

## 2017-04-02 DIAGNOSIS — Z888 Allergy status to other drugs, medicaments and biological substances status: Secondary | ICD-10-CM

## 2017-04-02 DIAGNOSIS — G4733 Obstructive sleep apnea (adult) (pediatric): Secondary | ICD-10-CM | POA: Diagnosis present

## 2017-04-02 DIAGNOSIS — E876 Hypokalemia: Secondary | ICD-10-CM | POA: Diagnosis present

## 2017-04-02 DIAGNOSIS — I110 Hypertensive heart disease with heart failure: Secondary | ICD-10-CM | POA: Diagnosis not present

## 2017-04-02 DIAGNOSIS — E785 Hyperlipidemia, unspecified: Secondary | ICD-10-CM | POA: Diagnosis present

## 2017-04-02 DIAGNOSIS — R0600 Dyspnea, unspecified: Secondary | ICD-10-CM

## 2017-04-02 DIAGNOSIS — I252 Old myocardial infarction: Secondary | ICD-10-CM

## 2017-04-02 DIAGNOSIS — J439 Emphysema, unspecified: Secondary | ICD-10-CM | POA: Diagnosis present

## 2017-04-02 DIAGNOSIS — Z8249 Family history of ischemic heart disease and other diseases of the circulatory system: Secondary | ICD-10-CM

## 2017-04-02 DIAGNOSIS — Z955 Presence of coronary angioplasty implant and graft: Secondary | ICD-10-CM

## 2017-04-02 DIAGNOSIS — B9620 Unspecified Escherichia coli [E. coli] as the cause of diseases classified elsewhere: Secondary | ICD-10-CM | POA: Diagnosis present

## 2017-04-02 DIAGNOSIS — M1990 Unspecified osteoarthritis, unspecified site: Secondary | ICD-10-CM | POA: Diagnosis present

## 2017-04-02 DIAGNOSIS — N289 Disorder of kidney and ureter, unspecified: Secondary | ICD-10-CM | POA: Diagnosis present

## 2017-04-02 DIAGNOSIS — I5032 Chronic diastolic (congestive) heart failure: Secondary | ICD-10-CM | POA: Diagnosis present

## 2017-04-02 DIAGNOSIS — Z90710 Acquired absence of both cervix and uterus: Secondary | ICD-10-CM

## 2017-04-02 DIAGNOSIS — K219 Gastro-esophageal reflux disease without esophagitis: Secondary | ICD-10-CM | POA: Diagnosis present

## 2017-04-02 DIAGNOSIS — Z1623 Resistance to quinolones and fluoroquinolones: Secondary | ICD-10-CM | POA: Diagnosis present

## 2017-04-02 DIAGNOSIS — Z880 Allergy status to penicillin: Secondary | ICD-10-CM

## 2017-04-02 DIAGNOSIS — Z7902 Long term (current) use of antithrombotics/antiplatelets: Secondary | ICD-10-CM

## 2017-04-02 DIAGNOSIS — I2510 Atherosclerotic heart disease of native coronary artery without angina pectoris: Secondary | ICD-10-CM | POA: Diagnosis present

## 2017-04-02 DIAGNOSIS — Z9981 Dependence on supplemental oxygen: Secondary | ICD-10-CM

## 2017-04-02 DIAGNOSIS — Z87891 Personal history of nicotine dependence: Secondary | ICD-10-CM

## 2017-04-02 DIAGNOSIS — F329 Major depressive disorder, single episode, unspecified: Secondary | ICD-10-CM | POA: Diagnosis present

## 2017-04-02 DIAGNOSIS — Z79891 Long term (current) use of opiate analgesic: Secondary | ICD-10-CM

## 2017-04-02 DIAGNOSIS — G2581 Restless legs syndrome: Secondary | ICD-10-CM | POA: Diagnosis present

## 2017-04-02 DIAGNOSIS — R0609 Other forms of dyspnea: Secondary | ICD-10-CM

## 2017-04-02 DIAGNOSIS — G4761 Periodic limb movement disorder: Secondary | ICD-10-CM | POA: Diagnosis present

## 2017-04-02 DIAGNOSIS — H409 Unspecified glaucoma: Secondary | ICD-10-CM | POA: Diagnosis present

## 2017-04-02 DIAGNOSIS — G8929 Other chronic pain: Secondary | ICD-10-CM | POA: Diagnosis present

## 2017-04-02 DIAGNOSIS — F418 Other specified anxiety disorders: Secondary | ICD-10-CM | POA: Diagnosis present

## 2017-04-02 DIAGNOSIS — I5033 Acute on chronic diastolic (congestive) heart failure: Secondary | ICD-10-CM | POA: Diagnosis present

## 2017-04-02 DIAGNOSIS — I272 Pulmonary hypertension, unspecified: Secondary | ICD-10-CM | POA: Diagnosis present

## 2017-04-02 LAB — BASIC METABOLIC PANEL
Anion gap: 10 (ref 5–15)
BUN: 15 mg/dL (ref 6–20)
CHLORIDE: 103 mmol/L (ref 101–111)
CO2: 25 mmol/L (ref 22–32)
CREATININE: 0.88 mg/dL (ref 0.44–1.00)
Calcium: 8.9 mg/dL (ref 8.9–10.3)
GFR calc non Af Amer: 57 mL/min — ABNORMAL LOW (ref >60)
GLUCOSE: 149 mg/dL — AB (ref 65–99)
Potassium: 4.5 mmol/L (ref 3.5–5.1)
Sodium: 138 mmol/L (ref 135–145)

## 2017-04-02 LAB — URINALYSIS, ROUTINE W REFLEX MICROSCOPIC
BILIRUBIN URINE: NEGATIVE
Glucose, UA: NEGATIVE mg/dL
Ketones, ur: NEGATIVE mg/dL
NITRITE: NEGATIVE
Protein, ur: NEGATIVE mg/dL
RBC / HPF: NONE SEEN RBC/hpf (ref 0–5)
SPECIFIC GRAVITY, URINE: 1.01 (ref 1.005–1.030)
pH: 7 (ref 5.0–8.0)

## 2017-04-02 LAB — CBC
HCT: 35 % — ABNORMAL LOW (ref 36.0–46.0)
Hemoglobin: 10.7 g/dL — ABNORMAL LOW (ref 12.0–15.0)
MCH: 26.8 pg (ref 26.0–34.0)
MCHC: 30.6 g/dL (ref 30.0–36.0)
MCV: 87.7 fL (ref 78.0–100.0)
PLATELETS: 303 10*3/uL (ref 150–400)
RBC: 3.99 MIL/uL (ref 3.87–5.11)
RDW: 17.1 % — ABNORMAL HIGH (ref 11.5–15.5)
WBC: 8.6 10*3/uL (ref 4.0–10.5)

## 2017-04-02 LAB — I-STAT TROPONIN, ED: Troponin i, poc: 0 ng/mL (ref 0.00–0.08)

## 2017-04-02 LAB — BRAIN NATRIURETIC PEPTIDE: B NATRIURETIC PEPTIDE 5: 663.3 pg/mL — AB (ref 0.0–100.0)

## 2017-04-02 MED ORDER — PANTOPRAZOLE SODIUM 40 MG PO TBEC
40.0000 mg | DELAYED_RELEASE_TABLET | Freq: Every day | ORAL | Status: DC
  Administered 2017-04-03 – 2017-04-05 (×4): 40 mg via ORAL
  Filled 2017-04-02 (×4): qty 1

## 2017-04-02 MED ORDER — ATORVASTATIN CALCIUM 40 MG PO TABS
40.0000 mg | ORAL_TABLET | Freq: Every day | ORAL | Status: DC
  Administered 2017-04-03 – 2017-04-04 (×3): 40 mg via ORAL
  Filled 2017-04-02 (×4): qty 1

## 2017-04-02 MED ORDER — ENOXAPARIN SODIUM 30 MG/0.3ML ~~LOC~~ SOLN
30.0000 mg | SUBCUTANEOUS | Status: DC
  Administered 2017-04-02 – 2017-04-04 (×3): 30 mg via SUBCUTANEOUS
  Filled 2017-04-02 (×3): qty 0.3

## 2017-04-02 MED ORDER — ALPRAZOLAM 0.25 MG PO TABS
0.2500 mg | ORAL_TABLET | Freq: Every evening | ORAL | Status: DC | PRN
  Administered 2017-04-02 – 2017-04-03 (×3): 0.25 mg via ORAL
  Filled 2017-04-02 (×3): qty 1

## 2017-04-02 MED ORDER — SODIUM CHLORIDE 0.9% FLUSH: 3.0000 mL | INTRAVENOUS | Status: DC | PRN

## 2017-04-02 MED ORDER — ONDANSETRON HCL 4 MG/2ML IJ SOLN: 4.0000 mg | Freq: Four times a day (QID) | INTRAMUSCULAR | Status: DC | PRN

## 2017-04-02 MED ORDER — POLYVINYL ALCOHOL 1.4 % OP SOLN
1.0000 [drp] | Freq: Four times a day (QID) | OPHTHALMIC | Status: DC
  Administered 2017-04-03 – 2017-04-05 (×11): 1 [drp] via OPHTHALMIC
  Filled 2017-04-02: qty 15

## 2017-04-02 MED ORDER — FUROSEMIDE 20 MG PO TABS
10.0000 mg | ORAL_TABLET | Freq: Every day | ORAL | Status: DC
  Administered 2017-04-02: 10 mg via ORAL
  Filled 2017-04-02: qty 1

## 2017-04-02 MED ORDER — CLOPIDOGREL BISULFATE 75 MG PO TABS
75.0000 mg | ORAL_TABLET | Freq: Every day | ORAL | Status: DC
  Administered 2017-04-02 – 2017-04-05 (×4): 75 mg via ORAL
  Filled 2017-04-02 (×4): qty 1

## 2017-04-02 MED ORDER — OLANZAPINE-FLUOXETINE HCL 3-25 MG PO CAPS: 1.0000 | ORAL_CAPSULE | Freq: Every day | ORAL | Status: DC

## 2017-04-02 MED ORDER — IPRATROPIUM-ALBUTEROL 0.5-2.5 (3) MG/3ML IN SOLN: 3.0000 mL | Freq: Four times a day (QID) | RESPIRATORY_TRACT | Status: DC | PRN

## 2017-04-02 MED ORDER — LATANOPROST 0.005 % OP SOLN
1.0000 [drp] | Freq: Every day | OPHTHALMIC | Status: DC
  Administered 2017-04-03 – 2017-04-04 (×3): 1 [drp] via OPHTHALMIC
  Filled 2017-04-02: qty 2.5

## 2017-04-02 MED ORDER — ONDANSETRON HCL 4 MG PO TABS: 4.0000 mg | ORAL_TABLET | Freq: Four times a day (QID) | ORAL | Status: DC | PRN

## 2017-04-02 MED ORDER — ACETAMINOPHEN 650 MG RE SUPP: 650.0000 mg | Freq: Four times a day (QID) | RECTAL | Status: DC | PRN

## 2017-04-02 MED ORDER — PREDNISOLONE ACETATE 1 % OP SUSP
1.0000 [drp] | Freq: Four times a day (QID) | OPHTHALMIC | Status: DC
  Administered 2017-04-03 – 2017-04-05 (×10): 1 [drp] via OPHTHALMIC
  Filled 2017-04-02: qty 1

## 2017-04-02 MED ORDER — SENNOSIDES-DOCUSATE SODIUM 8.6-50 MG PO TABS
1.0000 | ORAL_TABLET | Freq: Every evening | ORAL | Status: DC | PRN
  Administered 2017-04-04: 1 via ORAL
  Filled 2017-04-02: qty 1

## 2017-04-02 MED ORDER — SODIUM CHLORIDE 0.9% FLUSH
3.0000 mL | Freq: Two times a day (BID) | INTRAVENOUS | Status: DC
  Administered 2017-04-03 – 2017-04-05 (×6): 3 mL via INTRAVENOUS

## 2017-04-02 MED ORDER — SODIUM CHLORIDE 0.9 % IV SOLN: 250.0000 mL | INTRAVENOUS | Status: DC | PRN

## 2017-04-02 MED ORDER — FLUOXETINE HCL 20 MG PO CAPS
20.0000 mg | ORAL_CAPSULE | Freq: Every day | ORAL | Status: DC
  Administered 2017-04-02 – 2017-04-04 (×3): 20 mg via ORAL
  Filled 2017-04-02 (×3): qty 1

## 2017-04-02 MED ORDER — METOPROLOL TARTRATE 25 MG PO TABS
25.0000 mg | ORAL_TABLET | Freq: Two times a day (BID) | ORAL | Status: DC
  Administered 2017-04-02 – 2017-04-05 (×6): 25 mg via ORAL
  Filled 2017-04-02 (×6): qty 1

## 2017-04-02 MED ORDER — ACETAMINOPHEN 325 MG PO TABS
650.0000 mg | ORAL_TABLET | Freq: Four times a day (QID) | ORAL | Status: DC | PRN
  Filled 2017-04-02: qty 2

## 2017-04-02 MED ORDER — NETARSUDIL DIMESYLATE 0.02 % OP SOLN
1.0000 [drp] | Freq: Every day | OPHTHALMIC | Status: DC
  Administered 2017-04-03 – 2017-04-04 (×3): 1 [drp] via OPHTHALMIC

## 2017-04-02 MED ORDER — DORZOLAMIDE HCL-TIMOLOL MAL 2-0.5 % OP SOLN
1.0000 [drp] | Freq: Two times a day (BID) | OPHTHALMIC | Status: DC
  Administered 2017-04-03 – 2017-04-05 (×7): 1 [drp] via OPHTHALMIC
  Filled 2017-04-02: qty 10

## 2017-04-02 MED ORDER — FUROSEMIDE 10 MG/ML IJ SOLN
20.0000 mg | Freq: Once | INTRAMUSCULAR | Status: AC
  Administered 2017-04-02: 20 mg via INTRAVENOUS
  Filled 2017-04-02: qty 2

## 2017-04-02 MED ORDER — PRAMIPEXOLE DIHYDROCHLORIDE 0.25 MG PO TABS
0.5000 mg | ORAL_TABLET | Freq: Every evening | ORAL | Status: DC | PRN
  Administered 2017-04-03 – 2017-04-04 (×3): 0.5 mg via ORAL
  Filled 2017-04-02 (×8): qty 2

## 2017-04-02 MED ORDER — IOPAMIDOL (ISOVUE-370) INJECTION 76%
INTRAVENOUS | Status: AC
  Administered 2017-04-02: 50 mL
  Filled 2017-04-02: qty 100

## 2017-04-02 MED ORDER — OLANZAPINE 2.5 MG PO TABS
2.5000 mg | ORAL_TABLET | Freq: Every day | ORAL | Status: DC
  Administered 2017-04-02 – 2017-04-04 (×3): 2.5 mg via ORAL
  Filled 2017-04-02 (×3): qty 1

## 2017-04-02 NOTE — ED Notes (Signed)
MAIN LAB TO ADD ON bnp

## 2017-04-02 NOTE — ED Notes (Signed)
Pt ambulated to the door with assistance and oxygen saturation dropped to the 80s on room air. EDP made aware

## 2017-04-02 NOTE — ED Provider Notes (Signed)
[facility] EMERGENCY DEPARTMENT Provider Note   CSN: [id number] Arrival date & time: 04/02/17  1158     History   Chief Complaint Chief Complaint  Patient presents with  . Shortness of Breath    HPI JUBILEE VIVERO is a 84 y.o. female.  The history is provided by the patient and medical records. No language interpreter was used.     KAILENE STEINHART is a 84 y.o. female  with a PMH of CAD, CHF who presents to the Emergency Department complaining of shortness of breath x 1.5 weeks. Patient reports this is worse when she does things around the kitchen or goes out to lunch. Sounds as if this is worse with exertion. She endorses associated central upper back pain. Back pain only occurs when she will experience dyspnea. She does report intermittent sharp central chest pain off-and-on throughout the day, but when asked where this hurts, she will again point to her back and talk about how it hurts behind her chest. She was seen by primary care on Monday who diagnosed her with a virus and thought she had pleurisy. She was seen by PCP again on Thursday for the same where blood work and x-rays were performed. Family received a call today that labs and x-ray were normal, however she does not seem to be improving, therefore she was brought to ER for further evaluation. No fever, chills, abdominal pain, nausea, vomiting, diaphoresis. She does wear 2L [place] oxygen at home only at night.   Past Medical History:  Diagnosis Date  . Arthritis   . Atherosclerosis of aorta (HCC)   . CAD (coronary artery disease)    a. 10/27 PCI with DES to RCA, diffuse nonobstructive disease, EF 50-55%  . Chronic pain   . Diverticulosis   . Dynamic left ventricular outflow obstruction    Mod focal basal LVH. EF 60-65% w/ dynamic LVOT obstruction (Peak Gradient 138 mmHg). No RWMA. Gr 2 DD - high filling pressures. Severe SAM. ~ PAP 35 mmHg.  Marland Kitchen Elevated transaminase level   . GERD (gastroesophageal reflux  disease)   . Glaucoma   . Hiatal hernia   . Hypertension   . Lung nodules    Right  . MI (myocardial infarction) (HCC) 01/09/2016  . Scoliosis   . UTI (urinary tract infection)    refractory    Patient Active Problem List   Diagnosis Date Noted  . OSA (obstructive sleep apnea) 12/12/2016  . Periodic limb movements of sleep 12/12/2016  . Acute respiratory failure with hypoxia and hypercapnia (HCC) 09/28/2016  . CHF (congestive heart failure) (HCC) 09/23/2016  . Dyspnea 09/23/2016  . Acute on chronic diastolic CHF (congestive heart failure) (HCC) 09/23/2016  . Dyslipidemia, goal LDL below 70 08/04/2016  . DOE (dyspnea on exertion) 06/24/2016  . Dynamic left ventricular outflow obstruction 04/24/2016  . Coronary artery disease involving native coronary artery of native heart with angina pectoris (HCC) 04/24/2016  . Hypotension 04/12/2016  . HCAP (healthcare-associated pneumonia) 04/12/2016  . History of MI (myocardial infarction) 04/12/2016  . Depression with anxiety 04/12/2016  . Chronic pain syndrome 04/12/2016  . Abdominal pain 03/07/2016  . Anemia 03/06/2016  . Diarrhea 03/06/2016  . Other fatigue 03/06/2016  . Elevated transaminase level 03/06/2016  . Chronic pain   . Diverticulitis 02/29/2016  . ST elevation myocardial infarction (STEMI) of inferior wall, subsequent episode of care (HCC) 01/09/2016  . Urinary tract infection without hematuria   . NSTEMI (non-ST elevated myocardial infarction) (HCC)  01/08/2016  . UTI (urinary tract infection) 05/27/2014  . PID (acute pelvic inflammatory disease) 05/27/2014  . Vaginal inflammation from pessary (HCC) 05/27/2014  . Leukocytosis 05/27/2014  . Nausea and vomiting 05/27/2014  . Hypertension   . Hiatal hernia   . Lung nodules   . Glaucoma   . GERD (gastroesophageal reflux disease)     Past Surgical History:  Procedure Laterality Date  . ABDOMINAL HYSTERECTOMY    . CARDIAC CATHETERIZATION N/A 01/09/2016   Procedure:  Left Heart Cath and Coronary Angiography;  Surgeon: Marykay Lex, MD;  Location: [facility] INVASIVE CV LAB: mRCA 100% thrombotic lesion (PCI). o-pLAD 40%, mLAD 30%, oD1 90%. OM1 (small) 70%.  . CARDIAC CATHETERIZATION N/A 01/09/2016   Procedure: Coronary Stent Intervention;  Surgeon: Marykay Lex, MD;  Location: [facility] INVASIVE CV LAB: mRCA PCI - STENT PROMUS PREM MR 2.25X28 drug eluting stent  . EYE SURGERY    . FRACTURE SURGERY     Tibia/fibula of right leg  . TRANSTHORACIC ECHOCARDIOGRAM  01/11/2016   Mod focal basal LVH. EF 60-65% w/ dynamic LVOT obstruction (Peak Gradient 138 mmHg). No RWMA. Gr 2 DD - high filling pressures. Severe SAM. ~ PAP 35 mmHg.  Marland Kitchen TRANSTHORACIC ECHOCARDIOGRAM  07/08/2016   moderate basal hypertrophy and mild concentric hypertrophy. EF 60-65%. Dynamic outflow tract obstruction (peak gradient 17 mmHg). No aortic stenosis. GR 1 DD. Systolic anterior motion of mitral valve. No MR. Elevated PA pressures estimated at 59 mmHg.    OB History    No data available       Home Medications    Prior to Admission medications   Medication Sig Start Date End Date Taking? Authorizing Provider  atorvastatin (LIPITOR) 40 MG tablet Take 1 tablet by mouth daily. 03/16/16   [provider]  bimatoprost (LUMIGAN) 0.01 % SOLN Place 1 drop into both eyes at bedtime.     [provider]  clopidogrel (PLAVIX) 75 MG tablet Take 1 tablet (75 mg total) by mouth daily. 01/14/16   Arty Baumgartner, NP  docusate sodium (COLACE) 100 MG capsule Take 100 mg by mouth 2 (two) times daily.     [provider]  dorzolamide-timolol (COSOPT) 22.3-6.8 MG/ML ophthalmic solution Place 1 drop into both eyes 2 (two) times daily.    [provider]  esomeprazole (NEXIUM) 40 MG capsule Take 40 mg by mouth daily. 03/19/16   [provider]  furosemide (LASIX) 20 MG tablet Take 0.5 tablets (10 mg total) by mouth daily. 09/28/16   Narda Bonds, MD  ipratropium-albuterol  (DUONEB) 0.5-2.5 (3) MG/3ML SOLN Take 3 mLs by nebulization every 6 (six) hours as needed. Patient not taking: Reported on 12/31/2016 09/28/16   Narda Bonds, MD  OLANZapine-FLUoxetine [facility]) 3-25 MG capsule Take 1 capsule by mouth every evening.    [provider]  oxyCODONE (OXY IR/ROXICODONE) 5 MG immediate release tablet Take 1 tablet (5 mg total) by mouth 3 (three) times daily as needed for severe pain. 09/28/16   Narda Bonds, MD  oxymorphone 7.5 MG PO T12A Take 7.5 mg by mouth every 12 (twelve) hours. 09/28/16   Narda Bonds, MD  pilocarpine (PILOCAR) 1 % ophthalmic solution Place 1 drop into both eyes 4 (four) times daily.    [provider]  Polyethyl Glycol-Propyl Glycol (SYSTANE) 0.4-0.3 % SOLN Place 1 drop into both eyes as needed (for dry eyes).    [provider]  pramipexole (MIRAPEX) 0.25 MG tablet Take 0.25-0.5 mg  by mouth at bedtime.  03/19/16   [provider]    Family History Family History  Problem Relation Age of Onset  . Cancer Brother        Stomach  . Heart disease Father        Died age 59  . Diabetes Mother   . Heart failure Mother        Died 30    Social History Social History   Tobacco Use  . Smoking status: Former Smoker    Packs/day: 1.00    Years: 33.00    Pack years: 33.00    Types: Cigarettes    Last attempt to quit: 03/15/1981    Years since quitting: 36.0  . Smokeless tobacco: Never Used  Substance Use Topics  . Alcohol use: No    Alcohol/week: 0.0 oz  . Drug use: No     Allergies   Penicillins and Phenergan [promethazine hcl]   Review of Systems Review of Systems  Respiratory: Positive for shortness of breath.   Musculoskeletal: Positive for back pain. Negative for neck pain.  All other systems reviewed and are negative.   Physical Exam Updated Vital Signs BP (!) 158/91 (BP Location: Left Arm)   Pulse 85   Temp 97.9 F (36.6 C) (Oral)   Resp 16   SpO2 97%   Physical Exam    Constitutional: She is oriented to person, place, and time. She appears well-developed and well-nourished. No distress.  HENT:  Head: Normocephalic and atraumatic.  Cardiovascular: Normal rate, regular rhythm and normal heart sounds.  No murmur heard. Pulmonary/Chest: Effort normal and breath sounds normal. No respiratory distress. She has no wheezes. She has no rales. She exhibits tenderness.  Abdominal: Soft. She exhibits no distension. There is no tenderness.  Musculoskeletal:  Tenderness to palpation to midline and paraspinal thoracic spine.   Neurological: She is alert and oriented to person, place, and time.  Skin: Skin is warm and dry.  Nursing note and vitals reviewed.    ED Treatments / Results  Labs (all labs ordered are listed, but only abnormal results are displayed) Labs Reviewed  BASIC METABOLIC PANEL - Abnormal; Notable for the following components:      Result Value   Glucose, Bld 149 (*)    GFR calc non Af Amer 57 (*)    All other components within normal limits  CBC - Abnormal; Notable for the following components:   Hemoglobin 10.7 (*)    HCT 35.0 (*)    RDW 17.1 (*)    All other components within normal limits  I-STAT TROPONIN, ED    EKG  EKG Interpretation None       Radiology Dg Chest 2 View  Result Date: 04/02/2017 CLINICAL DATA:  Acute mid chest pain, weakness EXAM: CHEST  2 VIEW COMPARISON:  03/31/2017 FINDINGS: Stable cardiomegaly with mild vascular congestion and nonspecific interstitial prominence. No definite CHF, edema, effusion, or pneumothorax. No focal pneumonia, collapse or consolidation. Aorta atherosclerotic and tortuous. Degenerative changes of the spine with associated scoliosis. Arthropathy of both shoulders with a right shoulder arthroplasty noted with chronic superior subluxation. Trachea is midline. Multilevel thoracic compression deformities as before with increased thoracic kyphosis. IMPRESSION: Stable chest exam without  superimposed acute process. Electronically Signed   By: Judie Petit.  Shick M.D.   On: 04/02/2017 12:56    Procedures Procedures (including critical care time)  Medications Ordered in ED Medications - No data to display   Initial Impression / Assessment and Plan /  ED Course  I have reviewed the triage vital signs and the nursing notes.  Pertinent labs & imaging results that were available during my care of the patient were reviewed by me and considered in my medical decision making (see chart for details).    AMARA JUSTEN is a 85 y.o. female who presents to ED for upper back pain and shortness of breath x 1.5 weeks. Seen by PCP x 2 in the last week and thought to be viral pleurisy. Patient and family note little improvement. Blood work reassuring. CXR with no acute process. CT angio pending at shift change. Care assumed by oncoming PA Kirichenko who will follow up on pending urine and angio. If negative, likely discharge to home with outpatient follow up.   Patient seen by and discussed with Dr. Fayrene Fearing who agrees with treatment plan.    Final Clinical Impressions(s) / ED Diagnoses   Final diagnoses:  None    ED Discharge Orders    None       Ward, Chase Picket, PA-C 04/02/17 1529    Rolland Porter, MD 04/03/17 2145

## 2017-04-02 NOTE — ED Notes (Signed)
Pt requesting to take medication for restless leg, EDP ok'd for pt to take home meds. Pt given 2 Pramipexole 0.5mg  tablets from home meds.

## 2017-04-02 NOTE — ED Notes (Signed)
ED Provider at bedside. 

## 2017-04-02 NOTE — ED Triage Notes (Signed)
To ED for further eval of sob and now unable to sleep and with some cp. Daughter states pt has been seen by pcp a couple of times for the same and told she has pleurisy  and a virus. Has had an xray and has had blood work - told everything was ok. Pt doesn't appear to be in any distress at this time. Pt is speaking in full clear sentences. No vomiting but having diarrhea.

## 2017-04-02 NOTE — H&P (Signed)
History and Physical    Alexis Yates ZOX:[id number] DOB: Dec 20, 1927 DOA: 04/02/2017  PCP: Marden Noble, MD  Patient coming from: Home  Chief Complaint: Shortness of breath   HPI: Alexis Yates is a 84 y.o. female with medical history significant of chronic hypoxic/hypercapnic respiratory failure with restrictive lung disease from kyphoscoliosis, mild COPD with emphysema, coronary artery disease, pulmonary hypertension, hypertension, restless leg syndrome.  She presents with one-week history of shortness of breath.  She states that she wears 2 L of nasal cannula O2 at nighttime and intermittently during the day but not all the time.  She has been having back pain and epigastric abdominal pain.  She states that anytime she exerts herself, she gets very winded and needs time to catch her breath.  She denies any fevers or cough, she denies any chest pain.  She had episodes of constipation and diarrhea but that has resolved.  She currently sleeps with 3 pillows as this is more comfortable for her.  She tells me that she follows with Dr. Craige Cotta her pulmonologist.  She recently had a sleep study but states that it found a mild sleep apnea and did not require CPAP.  However, on chart review from Dr. Evlyn Courier progress note on 12/31/2016, she was diagnosed with moderate sleep apnea and was recommended for CPAP, which she had declined.  ED Course: Labs obtained which was significant for BNP over 600.  She was given IV Lasix.  TRH called for observation of patient as patient's ambulatory pulse ox was in the 80s.  Review of Systems: As per HPI otherwise 10 point review of systems negative.   Past Medical History:  Diagnosis Date  . Arthritis   . Atherosclerosis of aorta (HCC)   . CAD (coronary artery disease)    a. 10/27 PCI with DES to RCA, diffuse nonobstructive disease, EF 50-55%  . Chronic pain   . Diverticulosis   . Dynamic left ventricular outflow obstruction    Mod focal basal LVH. EF 60-65% w/  dynamic LVOT obstruction (Peak Gradient 138 mmHg). No RWMA. Gr 2 DD - high filling pressures. Severe SAM. ~ PAP 35 mmHg.  Marland Kitchen Elevated transaminase level   . GERD (gastroesophageal reflux disease)   . Glaucoma   . Hiatal hernia   . Hypertension   . Lung nodules    Right  . MI (myocardial infarction) (HCC) 01/09/2016  . Scoliosis   . UTI (urinary tract infection)    refractory    Past Surgical History:  Procedure Laterality Date  . ABDOMINAL HYSTERECTOMY    . CARDIAC CATHETERIZATION N/A 01/09/2016   Procedure: Left Heart Cath and Coronary Angiography;  Surgeon: Marykay Lex, MD;  Location: [facility] INVASIVE CV LAB: mRCA 100% thrombotic lesion (PCI). o-pLAD 40%, mLAD 30%, oD1 90%. OM1 (small) 70%.  . CARDIAC CATHETERIZATION N/A 01/09/2016   Procedure: Coronary Stent Intervention;  Surgeon: Marykay Lex, MD;  Location: [facility] INVASIVE CV LAB: mRCA PCI - STENT PROMUS PREM MR 2.25X28 drug eluting stent  . EYE SURGERY    . FRACTURE SURGERY     Tibia/fibula of right leg  . TRANSTHORACIC ECHOCARDIOGRAM  01/11/2016   Mod focal basal LVH. EF 60-65% w/ dynamic LVOT obstruction (Peak Gradient 138 mmHg). No RWMA. Gr 2 DD - high filling pressures. Severe SAM. ~ PAP 35 mmHg.  Marland Kitchen TRANSTHORACIC ECHOCARDIOGRAM  07/08/2016   moderate basal hypertrophy and mild concentric hypertrophy. EF 60-65%. Dynamic outflow tract obstruction (peak gradient 17 mmHg). No aortic stenosis. GR  1 DD. Systolic anterior motion of mitral valve. No MR. Elevated PA pressures estimated at 59 mmHg.     reports that she quit smoking about 36 years ago. Her smoking use included cigarettes. She has a 33.00 pack-year smoking history. she has never used smokeless tobacco. She reports that she does not drink alcohol or use drugs.  Allergies  Allergen Reactions  . Penicillins Anaphylaxis    Has patient had a PCN reaction causing immediate rash, facial/tongue/throat swelling, SOB or lightheadedness with hypotension: Yes Has patient had a  PCN reaction causing severe rash involving mucus membranes or skin necrosis: No Has patient had a PCN reaction that required hospitalization Yes Has patient had a PCN reaction occurring within the last 10 years: No If all of the above answers are "NO", then may proceed with Cephalosporin use.   Marland Kitchen Phenergan [Promethazine Hcl] Other (See Comments)    Restless legs    Family History  Problem Relation Age of Onset  . Cancer Brother        Stomach  . Heart disease Father        Died age 81  . Diabetes Mother   . Heart failure Mother        Died 37    Prior to Admission medications   Medication Sig Start Date End Date Taking? Authorizing Provider  atorvastatin (LIPITOR) 40 MG tablet Take 1 tablet by mouth daily. 03/16/16   [provider]  bimatoprost (LUMIGAN) 0.01 % SOLN Place 1 drop into both eyes at bedtime.     [provider]  clopidogrel (PLAVIX) 75 MG tablet Take 1 tablet (75 mg total) by mouth daily. 01/14/16   Arty Baumgartner, NP  docusate sodium (COLACE) 100 MG capsule Take 100 mg by mouth 2 (two) times daily.     [provider]  dorzolamide-timolol (COSOPT) 22.3-6.8 MG/ML ophthalmic solution Place 1 drop into both eyes 2 (two) times daily.    [provider]  esomeprazole (NEXIUM) 40 MG capsule Take 40 mg by mouth daily. 03/19/16   [provider]  furosemide (LASIX) 20 MG tablet Take 0.5 tablets (10 mg total) by mouth daily. 09/28/16   Narda Bonds, MD  ipratropium-albuterol (DUONEB) 0.5-2.5 (3) MG/3ML SOLN Take 3 mLs by nebulization every 6 (six) hours as needed. Patient not taking: Reported on 12/31/2016 09/28/16   Narda Bonds, MD  OLANZapine-FLUoxetine [facility]) 3-25 MG capsule Take 1 capsule by mouth every evening.    [provider]  oxyCODONE (OXY IR/ROXICODONE) 5 MG immediate release tablet Take 1 tablet (5 mg total) by mouth 3 (three) times daily as needed for severe pain. 09/28/16   Narda Bonds, MD    oxymorphone 7.5 MG PO T12A Take 7.5 mg by mouth every 12 (twelve) hours. 09/28/16   Narda Bonds, MD  pilocarpine (PILOCAR) 1 % ophthalmic solution Place 1 drop into both eyes 4 (four) times daily.    [provider]  Polyethyl Glycol-Propyl Glycol (SYSTANE) 0.4-0.3 % SOLN Place 1 drop into both eyes as needed (for dry eyes).    [provider]  pramipexole (MIRAPEX) 0.25 MG tablet Take 0.25-0.5 mg by mouth at bedtime.  03/19/16   [provider]    Physical Exam: Vitals:   04/02/17 1345 04/02/17 1430 04/02/17 1500 04/02/17 1700  BP: (!) 179/99 (!) 161/96 (!) 148/126 (!) 169/117  Pulse: 65 67 64 72  Resp: (!) 22 (!) 22 (!) 24 15  Temp:  TempSrc:      SpO2: 95% 93% 95% 95%    Constitutional: NAD, calm, comfortable Eyes: PERRL, lids and conjunctivae normal ENMT: Mucous membranes are moist. Posterior pharynx clear of any exudate or lesions.Normal dentition.  Neck: normal, supple, no masses, no thyromegaly Respiratory: clear to auscultation bilaterally, no wheezing, no crackles. Normal respiratory effort. No accessory muscle use. Pulse ox in the 90s on room air.  Cardiovascular: Regular rate and rhythm, no murmurs / rubs / gallops. No extremity edema.  Abdomen: no tenderness, no masses palpated. No hepatosplenomegaly. Bowel sounds positive. TTP epigastric.  Musculoskeletal: no clubbing / cyanosis. No joint deformity upper and lower extremities. Good ROM, no contractures. Normal muscle tone.  Skin: no rashes, lesions, ulcers. No induration Neurologic: CN 2-12 grossly intact. Strength 5/5 in all 4.  Psychiatric: Normal judgment and insight. Alert and oriented x 3. Normal mood.   Labs on Admission: I have personally reviewed following labs and imaging studies  CBC: Recent Labs  Lab 04/02/17 1215  WBC 8.6  HGB 10.7*  HCT 35.0*  MCV 87.7  PLT 303   Basic Metabolic Panel: Recent Labs  Lab 04/02/17 1215  NA 138  K 4.5  CL 103  CO2 25  GLUCOSE  149*  BUN 15  CREATININE 0.88  CALCIUM 8.9   GFR: CrCl cannot be calculated (Unknown ideal weight.). Liver Function Tests: No results for input(s): AST, ALT, ALKPHOS, BILITOT, PROT, ALBUMIN in the last 168 hours. No results for input(s): LIPASE, AMYLASE in the last 168 hours. No results for input(s): AMMONIA in the last 168 hours. Coagulation Profile: No results for input(s): INR, PROTIME in the last 168 hours. Cardiac Enzymes: No results for input(s): CKTOTAL, CKMB, CKMBINDEX, TROPONINI in the last 168 hours. BNP (last 3 results) No results for input(s): PROBNP in the last 8760 hours. HbA1C: No results for input(s): HGBA1C in the last 72 hours. CBG: No results for input(s): GLUCAP in the last 168 hours. Lipid Profile: No results for input(s): CHOL, HDL, LDLCALC, TRIG, CHOLHDL, LDLDIRECT in the last 72 hours. Thyroid Function Tests: No results for input(s): TSH, T4TOTAL, FREET4, T3FREE, THYROIDAB in the last 72 hours. Anemia Panel: No results for input(s): VITAMINB12, FOLATE, FERRITIN, TIBC, IRON, RETICCTPCT in the last 72 hours. Urine analysis:    Component Value Date/Time   COLORURINE YELLOW 04/02/2017 1531   APPEARANCEUR HAZY (A) 04/02/2017 1531   LABSPEC 1.010 04/02/2017 1531   PHURINE 7.0 04/02/2017 1531   GLUCOSEU NEGATIVE 04/02/2017 1531   HGBUR SMALL (A) 04/02/2017 1531   BILIRUBINUR NEGATIVE 04/02/2017 1531   KETONESUR NEGATIVE 04/02/2017 1531   PROTEINUR NEGATIVE 04/02/2017 1531   UROBILINOGEN 0.2 05/27/2014 1122   NITRITE NEGATIVE 04/02/2017 1531   LEUKOCYTESUR MODERATE (A) 04/02/2017 1531   Sepsis Labs: !!!!!!!!!!!!!!!!!!!!!!!!!!!!!!!!!!!!!!!!!!!! @LABRCNTIP (procalcitonin:4,lacticidven:4) )No results found for this or any previous visit (from the past 240 hour(s)).   Radiological Exams on Admission: Dg Chest 2 View  Result Date: 04/02/2017 CLINICAL DATA:  Acute mid chest pain, weakness EXAM: CHEST  2 VIEW COMPARISON:  03/31/2017 FINDINGS: Stable  cardiomegaly with mild vascular congestion and nonspecific interstitial prominence. No definite CHF, edema, effusion, or pneumothorax. No focal pneumonia, collapse or consolidation. Aorta atherosclerotic and tortuous. Degenerative changes of the spine with associated scoliosis. Arthropathy of both shoulders with a right shoulder arthroplasty noted with chronic superior subluxation. Trachea is midline. Multilevel thoracic compression deformities as before with increased thoracic kyphosis. IMPRESSION: Stable chest exam without superimposed acute process. Electronically Signed   By: Judie Petit.  Shick  M.D.   On: 04/02/2017 12:56   Ct Angio Chest Pe W And/or Wo Contrast  Result Date: 04/02/2017 CLINICAL DATA:  Short of breath, back pain for 1 week. Concern for pulmonary embolism. EXAM: CT ANGIOGRAPHY CHEST WITH CONTRAST TECHNIQUE: Multidetector CT imaging of the chest was performed using the standard protocol during bolus administration of intravenous contrast. Multiplanar CT image reconstructions and MIPs were obtained to evaluate the vascular anatomy. CONTRAST:  50mL ISOVUE-370 IOPAMIDOL (ISOVUE-370) INJECTION 76% COMPARISON:  CT chest 09/23/2016 FINDINGS: Cardiovascular: No filling defects within the pulmonary arteries arteries to suggest acute pulmonary embolism. Aorta is not opacified. Mediastinum/Nodes: No axillary supraclavicular adenopathy. No mediastinal hilar adenopathy. Esophagus normal. Lungs/Pleura: No airspace disease. No suspicious pulmonary nodularity. Mild interlobular septal thickening. Mild ground-glass opacities. Peripheral nodule in the RIGHT lower lobe measures 5 mm (image 54, series 6). This not changed from comparison exam Upper Abdomen: Limited view of the liver, kidneys, pancreas are unremarkable. Normal adrenal glands. Musculoskeletal: Kyphoscoliosis. Compression fracture in the midthoracic spine is chronic. Review of the MIP images confirms the above findings. IMPRESSION: 1. No evidence acute  pulmonary embolism. 2. Mild interstitial edema. 3. Stable pulmonary nodule in the RIGHT lower lobe. Aortic Atherosclerosis (ICD10-I70.0). Electronically Signed   By: Genevive Bi M.D.   On: 04/02/2017 15:32    EKG: Independently reviewed.  Normal sinus rhythm with nonspecific T wave inversion  Assessment/Plan Principal Problem:   Acute hypoxemic respiratory failure (HCC) Active Problems:   History of MI (myocardial infarction)   Depression with anxiety   DOE (dyspnea on exertion)   Dyslipidemia, goal LDL below 70   Chronic diastolic CHF (congestive heart failure) (HCC)   OSA (obstructive sleep apnea)   Periodic limb movements of sleep  Acute on chronic hypoxic, hypercapnic respiratory failure -Uses 2 L of oxygen at nighttime.  Ambulatory pulse ox in the emergency department was 80% on room air. -CTA chest negative for PE, no consolidation -BNP 663 however she does not appear fluid overloaded and there is no pulmonary edema noted on imaging. She was given 20mg  IV lasix in the ED.  -On chart review, patient follows with Dr. Craige Cotta and was diagnosed with chronic hypoxic, hypercapnic respiratory failure with restrictive lung disease due to kyphoscoliosis as well as mild COPD with emphysema, group 2 and 3 pulmonary hypertension, moderate sleep apnea which patient had declined CPAP therapy at the time -Continue nasal cannula O2, may need continuous going forward -Will need outpatient follow up with Dr. Craige Cotta, pulmonology  Chronic diastolic heart failure -Echocardiogram obtained on 09/25/2016 was significant for an EF range of 65-70% with no regional wall motion abnormalities and grade 1 diastolic dysfunction -BNP 663 however she does not appear fluid overloaded and there is only mild interstitial edema noted on imaging. She was given 20mg  IV lasix in the ED.  -Continue home lasix PO dose, lopressor   HLD -Continue lipitor   CAD -Continue plavix  RLS -Continue mirapex    DVT  prophylaxis: lovenox Code Status: Full, confirmed with patient at time of admission  Family Communication: Son in Social worker at bedside Disposition Plan: Pending improvement, back home  Consults called: None   Admission status: observation   Severity of Illness: The appropriate patient status for this patient is OBSERVATION. Observation status is judged to be reasonable and necessary in order to provide the required intensity of service to ensure the patient's safety. The patient's presenting symptoms, physical exam findings, and initial radiographic and laboratory data in the context of their medical  condition is felt to place them at decreased risk for further clinical deterioration. Furthermore, it is anticipated that the patient will be medically stable for discharge from the hospital within 2 midnights of admission. The following factors support the patient status of observation.   " The patient's presenting symptoms include shortness of breath. " The physical exam findings include ambulatory pulse ox 80s on room air. " The initial radiographic and laboratory data are significant for BNP 663.    Noralee Stain, DO [facility] [email or web address] Password [facility] 04/02/2017, 5:46 PM

## 2017-04-02 NOTE — ED Notes (Signed)
Pt reports did not take lasix or metoprolol this AM for BP

## 2017-04-02 NOTE — ED Provider Notes (Signed)
3:45 PM Pt signed out to me at shift change. Pt in ED with SOB and back pain for 1 week. Hx of CO2 retention, CHF. Work up here unremarkable, pt signed out with CT angio pending for further evaluation of her symptoms. Pt's CT angio showing mild interstitial edema, otherwise uremarkable. On my evaluation, pt is tachypneic. Will ambulate and add BNP.   4:20 PM Pt ambulated to the door, oxygen sat dropped into mid to high 80s. Pt became very tacheipneic. BNp pending. Will give lasix, pt takes 10mg  daily at home, will administer 20mg  IV. Given hypoxia will admit.   Spoke with hospitalist, who will admit pt.   Vitals:   04/02/17 1345 04/02/17 1430 04/02/17 1500 04/02/17 1700  BP: (!) 179/99 (!) 161/96 (!) 148/126 (!) 169/117  Pulse: 65 67 64 72  Resp: (!) 22 (!) 22 (!) 24 15  Temp:      TempSrc:      SpO2: 95% 93% 95% 95%       Jaynie Crumble, PA-C 04/02/17 1717    Rolland Porter, MD 04/03/17 2145

## 2017-04-03 ENCOUNTER — Encounter (HOSPITAL_COMMUNITY): Payer: Self-pay | Admitting: *Deleted

## 2017-04-03 ENCOUNTER — Other Ambulatory Visit: Payer: Self-pay

## 2017-04-03 DIAGNOSIS — J9601 Acute respiratory failure with hypoxia: Secondary | ICD-10-CM | POA: Diagnosis not present

## 2017-04-03 LAB — BASIC METABOLIC PANEL
Anion gap: 15 (ref 5–15)
BUN: 13 mg/dL (ref 6–20)
CHLORIDE: 99 mmol/L — AB (ref 101–111)
CO2: 23 mmol/L (ref 22–32)
Calcium: 9.5 mg/dL (ref 8.9–10.3)
Creatinine, Ser: 0.84 mg/dL (ref 0.44–1.00)
GFR calc Af Amer: >60 mL/min (ref >60)
GFR calc non Af Amer: >60 mL/min (ref >60)
Glucose, Bld: 108 mg/dL — ABNORMAL HIGH (ref 65–99)
POTASSIUM: 3.6 mmol/L (ref 3.5–5.1)
Sodium: 137 mmol/L (ref 135–145)

## 2017-04-03 LAB — CBC
HEMATOCRIT: 39.6 % (ref 36.0–46.0)
Hemoglobin: 12.6 g/dL (ref 12.0–15.0)
MCH: 27 pg (ref 26.0–34.0)
MCHC: 31.8 g/dL (ref 30.0–36.0)
MCV: 85 fL (ref 78.0–100.0)
Platelets: 388 10*3/uL (ref 150–400)
RBC: 4.66 MIL/uL (ref 3.87–5.11)
RDW: 16.9 % — ABNORMAL HIGH (ref 11.5–15.5)
WBC: 12.5 10*3/uL — ABNORMAL HIGH (ref 4.0–10.5)

## 2017-04-03 MED ORDER — FUROSEMIDE 10 MG/ML IJ SOLN
20.0000 mg | Freq: Once | INTRAMUSCULAR | Status: AC
  Administered 2017-04-03: 20 mg via INTRAVENOUS
  Filled 2017-04-03: qty 2

## 2017-04-03 MED ORDER — OXYMORPHONE HCL ER 10 MG PO T12A
10.0000 mg | EXTENDED_RELEASE_TABLET | Freq: Two times a day (BID) | ORAL | Status: DC
  Administered 2017-04-03 – 2017-04-05 (×5): 10 mg via ORAL
  Filled 2017-04-03 (×5): qty 1

## 2017-04-03 MED ORDER — FUROSEMIDE 10 MG/ML IJ SOLN
20.0000 mg | Freq: Two times a day (BID) | INTRAMUSCULAR | Status: DC
  Administered 2017-04-03: 20 mg via INTRAVENOUS
  Filled 2017-04-03: qty 2

## 2017-04-03 MED ORDER — OXYCODONE HCL 5 MG PO TABS
5.0000 mg | ORAL_TABLET | Freq: Two times a day (BID) | ORAL | Status: DC | PRN
  Administered 2017-04-03 – 2017-04-04 (×3): 5 mg via ORAL
  Filled 2017-04-03 (×3): qty 1

## 2017-04-03 MED ORDER — CIPROFLOXACIN IN D5W 400 MG/200ML IV SOLN
400.0000 mg | INTRAVENOUS | Status: DC
  Administered 2017-04-03: 400 mg via INTRAVENOUS
  Filled 2017-04-03: qty 200

## 2017-04-03 NOTE — Evaluation (Signed)
Physical Therapy Evaluation Patient Details Name: Alexis Yates MRN: [id number] DOB: December 14, 1927 Today's Date: 04/03/2017   History of Present Illness  Alexis Yates is a 84 y.o. female with medical history significant of chronic hypoxic/hypercapnic respiratory failure with restrictive lung disease from kyphoscoliosis, mild COPD with emphysema, coronary artery disease, pulmonary hypertension, hypertension, restless leg syndrome.  She presents with one-week history of shortness of breath.  She states that she wears 2 L of nasal cannula O2 at nighttime and intermittently during the day but not all the time.  She has been having back pain and epigastric abdominal pain.  She states that anytime she exerts herself, she gets very winded and needs time to catch her breath.  Clinical Impression   Pt admitted with above diagnosis. Pt currently with functional limitations due to the deficits listed below (see PT Problem List). From home alone; fiercely independent, and very much wishes to stay in her home; presents with decr functional mobility, and decr activity tolerance; Strongly recommend [facility] services; also worth considering referral to Pain Management specialist;  Pt will benefit from skilled PT to increase their independence and safety with mobility to allow discharge to the venue listed below.       Follow Up Recommendations Home health PT;Other (comment)(HHOT and HHAide) Worth considering referral to pain management specialist   Equipment Recommendations  None recommended by PT    Recommendations for Other Services OT consult     Precautions / Restrictions Precautions Precautions: Fall      Mobility  Bed Mobility                  Transfers Overall transfer level: Needs assistance Equipment used: Rolling walker (2 wheeled) Transfers: Sit to/from Stand Sit to Stand: Min guard(without physical contact)         General transfer comment: Noting slow rise, with struggle;  Audible crepitus; heavily dependent on UE assist  Ambulation/Gait Ambulation/Gait assistance: Min guard Ambulation Distance (Feet): 35 Feet Assistive device: Rolling walker (2 wheeled) Gait Pattern/deviations: Step-through pattern;Decreased step length - right;Decreased step length - left;Trunk flexed Gait velocity: slowed Gait velocity interpretation: Below normal speed for age/gender General Gait Details: Cues to self-monitor for activity tolerance  Stairs            Wheelchair Mobility    Modified Rankin (Stroke Patients Only)       Balance                                             Pertinent Vitals/Pain Pain Assessment: Faces Faces Pain Scale: Hurts little more Pain Location: REports chronic back and neck pain Pain Descriptors / Indicators: Aching Pain Intervention(s): Repositioned    Home Living Family/patient expects to be discharged to:: Private residence Living Arrangements: Alone Available Help at Discharge: Family;Available PRN/intermittently Type of Home: House Home Access: Level entry     Home Layout: One level Home Equipment: Walker - 4 wheels;Cane - single point;Bedside commode;Tub bench;Walker - 2 wheels;Grab bars - toilet;Grab bars - tub/shower      Prior Function Level of Independence: Needs assistance   Gait / Transfers Assistance Needed: Holds furniture during gait in house, or rollator     Comments: Fiercely independent; staying with daughters "doesn't work out" per chart review     Hand Dominance   Dominant Hand: Right    Extremity/Trunk Assessment   Upper  Extremity Assessment Upper Extremity Assessment: Generalized weakness    Lower Extremity Assessment Lower Extremity Assessment: Generalized weakness       Communication   Communication: No difficulties  Cognition Arousal/Alertness: Awake/alert Behavior During Therapy: WFL for tasks assessed/performed Overall Cognitive Status: Within Functional  Limits for tasks assessed                                        General Comments General comments (skin integrity, edema, etc.): Ambulated and spent time in the bathroom on Room Air; DOE 2/4; O2 sats remained greater than or equal to 94% throughout session    Exercises     Assessment/Plan    PT Assessment Patient needs continued PT services  PT Problem List Decreased strength;Decreased range of motion;Decreased activity tolerance;Decreased balance;Decreased mobility;Decreased knowledge of use of DME;Decreased knowledge of precautions;Pain       PT Treatment Interventions DME instruction;Gait training;Functional mobility training;Therapeutic activities;Therapeutic exercise;Balance training;Cognitive remediation;Patient/family education    PT Goals (Current goals can be found in the Care Plan section)  Acute Rehab PT Goals Patient Stated Goal: hopes to get a nap soon PT Goal Formulation: With patient Potential to Achieve Goals: Fair    Frequency Min 3X/week   Barriers to discharge Decreased caregiver support Lives alone    Co-evaluation               AM-PAC PT "6 Clicks" Daily Activity  Outcome Measure Difficulty turning over in bed (including adjusting bedclothes, sheets and blankets)?: A Little Difficulty moving from lying on back to sitting on the side of the bed? : A Little Difficulty sitting down on and standing up from a chair with arms (e.g., wheelchair, bedside commode, etc,.)?: A Lot Help needed moving to and from a bed to chair (including a wheelchair)?: A Little Help needed walking in hospital room?: A Little Help needed climbing 3-5 steps with a railing? : Total 6 Click Score: 15    End of Session Equipment Utilized During Treatment: Other (comment)(Vitals machine) Activity Tolerance: Patient tolerated treatment well Patient left: in chair;with call bell/phone within reach;with family/visitor present Nurse Communication: Mobility  status PT Visit Diagnosis: Unsteadiness on feet (R26.81);Other abnormalities of gait and mobility (R26.89);Muscle weakness (generalized) (M62.81);Pain Pain - part of body: (Back and neck)    Time: 1001-1035 PT Time Calculation (min) (ACUTE ONLY): 34 min   Charges:   PT Evaluation $PT Eval Low Complexity: 1 Low PT Treatments $Gait Training: 8-22 mins   PT G Codes:        Van Clines, PT  Acute Rehabilitation Services Pager [phone] Office [phone]   Levi Aland 04/03/2017, 10:59 AM

## 2017-04-03 NOTE — Progress Notes (Addendum)
Pharmacy Antibiotic Note  Alexis Yates is a 84 y.o. female admitted on 04/02/2017 with Ecoli UTI, susceptibilities pending.  Pharmacy has been consulted for cipro dosing. Noted anaphylaxis allergy to PCN, only history of beta lactam use in Epic is meropenem.  Plan: Cipro 400mg  IV q24h Monitor clinical progress, c/s, renal function F/u de-escalation plan/LOT   Weight: 125 lb 14.1 oz (57.1 kg)  Temp (24hrs), Avg:98.4 F (36.9 C), Min:97.8 F (36.6 C), Max:98.9 F (37.2 C)  Recent Labs  Lab 04/02/17 1215 04/03/17 0731  WBC 8.6 12.5*  CREATININE 0.88 0.84    Estimated Creatinine Clearance: 37.3 mL/min (by C-G formula based on SCr of 0.84 mg/dL).    Allergies  Allergen Reactions  . Penicillins Anaphylaxis    Has patient had a PCN reaction causing immediate rash, facial/tongue/throat swelling, SOB or lightheadedness with hypotension: Yes Has patient had a PCN reaction causing severe rash involving mucus membranes or skin necrosis: No Has patient had a PCN reaction that required hospitalization Yes Has patient had a PCN reaction occurring within the last 10 years: No If all of the above answers are "NO", then may proceed with Cephalosporin use.   Marland Kitchen Phenergan [Promethazine Hcl] Other (See Comments)    Restless legs    Babs Bertin, PharmD, BCPS Clinical Pharmacist 04/03/2017 4:42 PM

## 2017-04-03 NOTE — Progress Notes (Signed)
SATURATION QUALIFICATIONS: (This note is used to comply with regulatory documentation for home oxygen)  Patient Saturations on Room Air at Rest = 93%  Patient Saturations on Room Air while Ambulating = 88%  Patient Saturations on 2 Liters of oxygen while Ambulating = 96%  Please briefly explain why patient needs home oxygen: 

## 2017-04-03 NOTE — Progress Notes (Signed)
[facility] Progress Note  Patient: Alexis Yates ZOX:[id number]   PCP: Marden Noble, MD DOB: 1927/12/13   DOA: 04/02/2017   DOS: 04/03/2017   Date of Service: the patient was seen and examined on 04/03/2017  Subjective: Continues to have fatigue as well as shortness of breath no chest pain no abdominal pain.  Brief hospital course: Pt. with PMH of COPD, emphysema, CAD, pul htn, HTN, restless leg syndrome, kyphoscoliosis with restrictive lung disease, chronic hypoxic respiratory failure, chronic back pain; admitted on 04/02/2017, presented with complaint of shortness of breath and fatigue, was found to have UTI and hypoxic respiratory failure due to CHF. Currently further plan is continue diuresis.  Assessment and Plan: Acute on chronic hypoxic, hypercapnic respiratory failure Acute on chronic diastolic CHF. -Uses 2 L of oxygen at nighttime.  Ambulatory pulse ox in the emergency department was 80% on room air.  Still drops to 88% on room air requiring 2 L of oxygen today.  Appears in distress with tachypnea. -CTA chest negative for PE, no consolidation -BNP 663  She was given 20mg  IV lasix in the ED.  We will continue 20 mg twice daily, avoiding further aggressive IV diuresis given the facility of the patient as well as presence of UTI.  -On chart review, patient follows with Dr. Craige Cotta and was diagnosed with chronic hypoxic, hypercapnic respiratory failure with restrictive lung disease due to kyphoscoliosis as well as mild COPD with emphysema, group 2 and 3 pulmonary hypertension, moderate sleep apnea which patient had declined CPAP therapy at the time -Continue nasal cannula O2, may need continuous going forward -Will need outpatient follow up with Dr. Craige Cotta, pulmonolog -Echocardiogram obtained on 09/25/2016 was significant for an EF range of 65-70% with no regional wall motion abnormalities and grade 1 diastolic dysfunction  E Coli UTI. Was started on IV ceftriaxone for  now.  HLD -Continue lipitor   CAD -Continue plavix  RLS -Continue mirapex   Diet: Regular diet DVT Prophylaxis: subcutaneous Heparin  Advance goals of care discussion: Full code  Family Communication: family was present at bedside, at the time of interview. The pt provided permission to discuss medical plan with the family. Opportunity was given to ask question and all questions were answered satisfactorily.   Disposition:  Discharge to home with home health tomorrow.  Consultants: None Procedures: None  Antibiotics: Anti-infectives (From admission, onward)   None       Objective: Physical Exam: Vitals:   04/02/17 2338 04/03/17 0513 04/03/17 1100 04/03/17 1550  BP: (!) 161/78 (!) 159/93 (!) 160/91 (!) 142/84  Pulse: 67 73 69 70  Resp: 20 20 20 19   Temp: 97.8 F (36.6 C) 98.9 F (37.2 C) 98.7 F (37.1 C) 98.6 F (37 C)  TempSrc: Oral Oral Oral Oral  SpO2: 95% 94% 97% 93%  Weight:  57.1 kg (125 lb 14.1 oz)      Intake/Output Summary (Last 24 hours) at 04/03/2017 1633 Last data filed at 04/03/2017 1138 Gross per 24 hour  Intake 720 ml  Output 1600 ml  Net -880 ml   Filed Weights   04/02/17 2100 04/03/17 0513  Weight: 56.5 kg (124 lb 9 oz) 57.1 kg (125 lb 14.1 oz)   General: Alert, Awake and Oriented to Time, Place and Person. Appear in moderate distress, affect flat Eyes: PERRL, Conjunctiva normal ENT: Oral Mucosa clear moist. Neck: positive JVD, no Abnormal Mass Or lumps Cardiovascular: S1 and S2 Present, no Murmur, Peripheral Pulses Present Respiratory: normal respiratory effort, Bilateral  Air entry equal and Decreased, no use of accessory muscle, bilateral Crackles, no wheezes Abdomen: Bowel Sound present, Soft and no tenderness, no hernia Skin: no redness, no Rash, no induration Extremities: trace Pedal edema, no calf tenderness Neurologic: Grossly no focal neuro deficit. Bilaterally Equal motor strength  Data Reviewed: CBC: Recent Labs  Lab  04/02/17 1215 04/03/17 0731  WBC 8.6 12.5*  HGB 10.7* 12.6  HCT 35.0* 39.6  MCV 87.7 85.0  PLT 303 388   Basic Metabolic Panel: Recent Labs  Lab 04/02/17 1215 04/03/17 0731  NA 138 137  K 4.5 3.6  CL 103 99*  CO2 25 23  GLUCOSE 149* 108*  BUN 15 13  CREATININE 0.88 0.84  CALCIUM 8.9 9.5    Liver Function Tests: No results for input(s): AST, ALT, ALKPHOS, BILITOT, PROT, ALBUMIN in the last 168 hours. No results for input(s): LIPASE, AMYLASE in the last 168 hours. No results for input(s): AMMONIA in the last 168 hours. Coagulation Profile: No results for input(s): INR, PROTIME in the last 168 hours. Cardiac Enzymes: No results for input(s): CKTOTAL, CKMB, CKMBINDEX, TROPONINI in the last 168 hours. BNP (last 3 results) No results for input(s): PROBNP in the last 8760 hours. CBG: No results for input(s): GLUCAP in the last 168 hours. Studies: No results found.  Scheduled Meds: . atorvastatin  40 mg Oral QHS  . clopidogrel  75 mg Oral Daily  . dorzolamide-timolol  1 drop Both Eyes BID  . enoxaparin (LOVENOX) injection  30 mg Subcutaneous Q24H  . OLANZapine  2.5 mg Oral QHS   And  . FLUoxetine  20 mg Oral QHS  . furosemide  20 mg Intravenous BID  . latanoprost  1 drop Both Eyes QHS  . metoprolol tartrate  25 mg Oral BID  . Netarsudil Dimesylate  1 drop Left Eye QHS  . oxymorphone  10 mg Oral BID  . pantoprazole  40 mg Oral Daily  . polyvinyl alcohol  1 drop Both Eyes QID  . pramipexole  0.5 mg Oral QHS,MR X 1  . prednisoLONE acetate  1 drop Right Eye QID  . sodium chloride flush  3 mL Intravenous Q12H   Continuous Infusions: . sodium chloride     PRN Meds: sodium chloride, acetaminophen **OR** acetaminophen, ALPRAZolam, ipratropium-albuterol, ondansetron **OR** ondansetron (ZOFRAN) IV, oxyCODONE, senna-docusate, sodium chloride flush  Time spent:  Author: Lynden Oxford, MD [facility] Pager: [phone] 04/03/2017 4:33 PM  If 7PM-7AM,  please contact night-coverage at [email or web address], password [facility]

## 2017-04-04 DIAGNOSIS — I2510 Atherosclerotic heart disease of native coronary artery without angina pectoris: Secondary | ICD-10-CM | POA: Diagnosis present

## 2017-04-04 DIAGNOSIS — R0602 Shortness of breath: Secondary | ICD-10-CM | POA: Diagnosis present

## 2017-04-04 DIAGNOSIS — J9622 Acute and chronic respiratory failure with hypercapnia: Secondary | ICD-10-CM | POA: Diagnosis present

## 2017-04-04 DIAGNOSIS — M1990 Unspecified osteoarthritis, unspecified site: Secondary | ICD-10-CM | POA: Diagnosis present

## 2017-04-04 DIAGNOSIS — G2581 Restless legs syndrome: Secondary | ICD-10-CM | POA: Diagnosis present

## 2017-04-04 DIAGNOSIS — I5033 Acute on chronic diastolic (congestive) heart failure: Secondary | ICD-10-CM | POA: Diagnosis present

## 2017-04-04 DIAGNOSIS — I110 Hypertensive heart disease with heart failure: Secondary | ICD-10-CM | POA: Diagnosis present

## 2017-04-04 DIAGNOSIS — J9601 Acute respiratory failure with hypoxia: Secondary | ICD-10-CM | POA: Diagnosis not present

## 2017-04-04 DIAGNOSIS — M419 Scoliosis, unspecified: Secondary | ICD-10-CM | POA: Diagnosis present

## 2017-04-04 DIAGNOSIS — I272 Pulmonary hypertension, unspecified: Secondary | ICD-10-CM | POA: Diagnosis present

## 2017-04-04 DIAGNOSIS — K5900 Constipation, unspecified: Secondary | ICD-10-CM | POA: Diagnosis present

## 2017-04-04 DIAGNOSIS — G4733 Obstructive sleep apnea (adult) (pediatric): Secondary | ICD-10-CM | POA: Diagnosis present

## 2017-04-04 DIAGNOSIS — B9620 Unspecified Escherichia coli [E. coli] as the cause of diseases classified elsewhere: Secondary | ICD-10-CM | POA: Diagnosis present

## 2017-04-04 DIAGNOSIS — J439 Emphysema, unspecified: Secondary | ICD-10-CM | POA: Diagnosis present

## 2017-04-04 DIAGNOSIS — N390 Urinary tract infection, site not specified: Secondary | ICD-10-CM | POA: Diagnosis present

## 2017-04-04 DIAGNOSIS — J9621 Acute and chronic respiratory failure with hypoxia: Secondary | ICD-10-CM | POA: Diagnosis present

## 2017-04-04 DIAGNOSIS — K219 Gastro-esophageal reflux disease without esophagitis: Secondary | ICD-10-CM | POA: Diagnosis present

## 2017-04-04 DIAGNOSIS — N289 Disorder of kidney and ureter, unspecified: Secondary | ICD-10-CM | POA: Diagnosis present

## 2017-04-04 DIAGNOSIS — F329 Major depressive disorder, single episode, unspecified: Secondary | ICD-10-CM | POA: Diagnosis present

## 2017-04-04 DIAGNOSIS — F419 Anxiety disorder, unspecified: Secondary | ICD-10-CM | POA: Diagnosis present

## 2017-04-04 DIAGNOSIS — Z1623 Resistance to quinolones and fluoroquinolones: Secondary | ICD-10-CM | POA: Diagnosis present

## 2017-04-04 DIAGNOSIS — E785 Hyperlipidemia, unspecified: Secondary | ICD-10-CM | POA: Diagnosis present

## 2017-04-04 DIAGNOSIS — Z9981 Dependence on supplemental oxygen: Secondary | ICD-10-CM | POA: Diagnosis not present

## 2017-04-04 DIAGNOSIS — G4761 Periodic limb movement disorder: Secondary | ICD-10-CM | POA: Diagnosis present

## 2017-04-04 DIAGNOSIS — G8929 Other chronic pain: Secondary | ICD-10-CM | POA: Diagnosis present

## 2017-04-04 DIAGNOSIS — E876 Hypokalemia: Secondary | ICD-10-CM | POA: Diagnosis present

## 2017-04-04 LAB — CBC
HEMATOCRIT: 40.4 % (ref 36.0–46.0)
HEMOGLOBIN: 12.7 g/dL (ref 12.0–15.0)
MCH: 27.2 pg (ref 26.0–34.0)
MCHC: 31.4 g/dL (ref 30.0–36.0)
MCV: 86.5 fL (ref 78.0–100.0)
Platelets: 372 10*3/uL (ref 150–400)
RBC: 4.67 MIL/uL (ref 3.87–5.11)
RDW: 17.6 % — AB (ref 11.5–15.5)
WBC: 12 10*3/uL — ABNORMAL HIGH (ref 4.0–10.5)

## 2017-04-04 LAB — BASIC METABOLIC PANEL
Anion gap: 15 (ref 5–15)
BUN: 23 mg/dL — AB (ref 6–20)
CO2: 24 mmol/L (ref 22–32)
Calcium: 9.3 mg/dL (ref 8.9–10.3)
Chloride: 97 mmol/L — ABNORMAL LOW (ref 101–111)
Creatinine, Ser: 1.29 mg/dL — ABNORMAL HIGH (ref 0.44–1.00)
GFR calc Af Amer: 42 mL/min — ABNORMAL LOW (ref >60)
GFR, EST NON AFRICAN AMERICAN: 36 mL/min — AB (ref >60)
GLUCOSE: 118 mg/dL — AB (ref 65–99)
POTASSIUM: 3.3 mmol/L — AB (ref 3.5–5.1)
Sodium: 136 mmol/L (ref 135–145)

## 2017-04-04 LAB — URINE CULTURE

## 2017-04-04 MED ORDER — CIPROFLOXACIN HCL 500 MG PO TABS: 500.0000 mg | ORAL_TABLET | Freq: Every day | ORAL | Status: DC

## 2017-04-04 MED ORDER — GUAIFENESIN ER 600 MG PO TB12
600.0000 mg | ORAL_TABLET | Freq: Two times a day (BID) | ORAL | Status: DC
  Administered 2017-04-04 – 2017-04-05 (×3): 600 mg via ORAL
  Filled 2017-04-04 (×3): qty 1

## 2017-04-04 MED ORDER — METHOCARBAMOL 500 MG PO TABS: 500.0000 mg | ORAL_TABLET | Freq: Once | ORAL | Status: DC

## 2017-04-04 MED ORDER — METHOCARBAMOL 500 MG PO TABS
500.0000 mg | ORAL_TABLET | Freq: Three times a day (TID) | ORAL | Status: DC
  Administered 2017-04-04 – 2017-04-05 (×4): 500 mg via ORAL
  Filled 2017-04-04 (×4): qty 1

## 2017-04-04 MED ORDER — FOSFOMYCIN TROMETHAMINE 3 G PO PACK
3.0000 g | PACK | ORAL | Status: DC
  Filled 2017-04-04 (×2): qty 3

## 2017-04-04 MED ORDER — POTASSIUM CHLORIDE CRYS ER 20 MEQ PO TBCR
40.0000 meq | EXTENDED_RELEASE_TABLET | Freq: Once | ORAL | Status: AC
  Administered 2017-04-04: 40 meq via ORAL
  Filled 2017-04-04: qty 2

## 2017-04-04 MED ORDER — LIDOCAINE 5 % EX PTCH
2.0000 | MEDICATED_PATCH | CUTANEOUS | Status: DC
  Administered 2017-04-04 – 2017-04-05 (×2): 2 via TRANSDERMAL
  Filled 2017-04-04 (×2): qty 2

## 2017-04-04 NOTE — Progress Notes (Signed)
Physical Therapy Treatment Patient Details Name: Alexis Yates MRN: [id number] DOB: 1928/01/30 Today's Date: 04/04/2017    History of Present Illness Alexis Yates is a 84 y.o. female with medical history significant of chronic hypoxic/hypercapnic respiratory failure with restrictive lung disease from kyphoscoliosis, mild COPD with emphysema, coronary artery disease, pulmonary hypertension, hypertension, restless leg syndrome.  She presents with one-week history of shortness of breath.  She states that she wears 2 L of nasal cannula O2 at nighttime and intermittently during the day but not all the time.  She has been having back pain and epigastric abdominal pain.  She states that anytime she exerts herself, she gets very winded and needs time to catch her breath.    PT Comments    Pt tolerated session well.  Mobilizing within room with RW and close supervision, slow moving 2/2 arthritic pain.  On RA throughout session for ambulation and therapeutic exercise from EOB.  O2 maintained >96% throughout mobilization.      Follow Up Recommendations  Home health PT     Equipment Recommendations  None recommended by PT    Recommendations for Other Services       Precautions / Restrictions Precautions Precautions: Fall Restrictions Weight Bearing Restrictions: No    Mobility  Bed Mobility Overal bed mobility: Modified Independent             General bed mobility comments: able to come to EOB with HOB raised and using bed rails, increased time but no assist  Transfers Overall transfer level: Needs assistance Equipment used: Rolling walker (2 wheeled) Transfers: Sit to/from Stand Sit to Stand: Supervision         General transfer comment: slow to rise from EOB and from toilet, audible crepitus   Ambulation/Gait Ambulation/Gait assistance: Supervision Ambulation Distance (Feet): 15 Feet Assistive device: Rolling walker (2 wheeled) Gait Pattern/deviations: Step-through  pattern;Decreased step length - right;Decreased step length - left;Antalgic Gait velocity: slowed       Stairs            Wheelchair Mobility    Modified Rankin (Stroke Patients Only)       Balance                                            Cognition Arousal/Alertness: Awake/alert Behavior During Therapy: WFL for tasks assessed/performed Overall Cognitive Status: Within Functional Limits for tasks assessed                                        Exercises General Exercises - Lower Extremity Long Arc Quad: AROM;Both;20 reps;Seated Hip ABduction/ADduction: AROM;Both;20 reps;Seated Hip Flexion/Marching: AROM;Both;20 reps;Seated Toe Raises: (attempted but unable to achieve active DF past neutral) Heel Raises: AROM;Both;20 reps;Seated    General Comments        Pertinent Vitals/Pain Pain Assessment: 0-10 Pain Score: 0-No pain    Home Living                      Prior Function            PT Goals (current goals can now be found in the care plan section) Acute Rehab PT Goals Patient Stated Goal: none stated this session PT Goal Formulation: With patient Potential to Achieve Goals: Fair Progress towards  PT goals: Progressing toward goals    Frequency    Min 3X/week      PT Plan Current plan remains appropriate    Co-evaluation              AM-PAC PT "6 Clicks" Daily Activity  Outcome Measure  Difficulty turning over in bed (including adjusting bedclothes, sheets and blankets)?: None Difficulty moving from lying on back to sitting on the side of the bed? : None Difficulty sitting down on and standing up from a chair with arms (e.g., wheelchair, bedside commode, etc,.)?: A Little Help needed moving to and from a bed to chair (including a wheelchair)?: A Little Help needed walking in hospital room?: A Little Help needed climbing 3-5 steps with a railing? : A Lot 6 Click Score: 19    End of Session    Activity Tolerance: Patient tolerated treatment well Patient left: in bed;with call bell/phone within reach;with family/visitor present Nurse Communication: Mobility status PT Visit Diagnosis: Unsteadiness on feet (R26.81);Muscle weakness (generalized) (M62.81)     Time: 2876-8115 PT Time Calculation (min) (ACUTE ONLY): 23 min  Charges:  $Therapeutic Exercise: 8-22 mins $Therapeutic Activity: 8-22 mins                    G Codes:       Estill Dooms, PT, DPT 04/04/17 2:39 PM

## 2017-04-04 NOTE — Progress Notes (Addendum)
[facility] Progress Note  Patient: Alexis Yates:[id number]   PCP: Marden Noble, MD DOB: 11-28-1927   DOA: 04/02/2017   DOS: 04/04/2017   Date of Service: the patient was seen and examined on 04/04/2017  Subjective: Still breathing heavy.  Her back pain is worse than yesterday.  No nausea no vomiting.  Occasional cough primarily orthopnea.  No chest pain.  No fever no chills.  Her p.o. intake.  Brief hospital course: Pt. with PMH of COPD, emphysema, CAD, pul htn, HTN, restless leg syndrome, kyphoscoliosis with restrictive lung disease, chronic hypoxic respiratory failure, chronic back pain; admitted on 04/02/2017, presented with complaint of shortness of breath and fatigue, was found to have UTI and hypoxic respiratory failure due to CHF. Currently further plan is continue diuresis.  Assessment and Plan: Acute on chronic hypoxic, respiratory failure Acute on chronic diastolic CHF. -Uses 2 L of oxygen at nighttime.  Ambulatory pulse ox in the emergency department was 80% on room air.   On ambulatory evaluation still drops to 84% which is worse than yesterday requiring 2 L of oxygen, still tachypneic.  -CTA chest negative for PE, no consolidation -BNP 663  She was given 20mg  IV lasix in the ED.  Patient was given IV Lasix 20 mg twice daily yesterday.  Serum creatinine has gone up today. Will transition to oral Lasix starting tomorrow hold off on further diuresis today given renal dysfunction. -Add incentive spirometry, flutter device, Mucinex.  -On chart review, patient follows with Dr. Craige Cotta and was diagnosed with chronic hypoxic, hypercapnic respiratory failure with restrictive lung disease due to kyphoscoliosis as well as mild COPD with emphysema, group 2 and 3 pulmonary hypertension, moderate sleep apnea which patient had declined CPAP therapy at the time -Continue nasal cannula O2, may need continuous going forward -Will need outpatient follow up with Dr. Craige Cotta,  pulmonology -Echocardiogram obtained on 09/25/2016 was significant for an EF range of 65-70% with no regional wall motion abnormalities and grade 1 diastolic dysfunction  E Coli UTI. Penicillin allergic with anaphylaxis. Started on Cipro. E. coli resistant to Cipro.  Treat with fosfomycin x2.  HLD -Continue lipitor   CAD -Continue plavix  RLS -Continue mirapex   Hypokalemia. In response to diuresis. Replacing. Recheck tomorrow.  Diet: Regular diet DVT Prophylaxis: subcutaneous Heparin  Advance goals of care discussion: Full code  Family Communication: no family was present at bedside, at the time of interview.  Discussed with family on 04/03/2017.  Disposition:  Discharge to home with home health.  Consultants: None Procedures: None  Antibiotics: Anti-infectives (From admission, onward)   Start     Dose/Rate Route Frequency Ordered Stop   04/04/17 2000  ciprofloxacin (CIPRO) tablet 500 mg     500 mg Oral Daily 04/04/17 0954     04/03/17 1700  ciprofloxacin (CIPRO) IVPB 400 mg  Status:  Discontinued     400 mg 200 mL/hr over 60 Minutes Intravenous Every 24 hours 04/03/17 1646 04/04/17 0954       Objective: Physical Exam: Vitals:   04/03/17 1550 04/03/17 1935 04/04/17 0500 04/04/17 0515  BP: (!) 142/84 95/74 129/77   Pulse: 70 67 68   Resp: 19 18 18    Temp: 98.6 F (37 C) 98.6 F (37 C) 98.2 F (36.8 C)   TempSrc: Oral Oral Oral   SpO2: 93% 96% 93%   Weight:   55.5 kg (122 lb 6.4 oz)   Height:    5' (1.524 m)    Intake/Output Summary (Last  24 hours) at 04/04/2017 1055 Last data filed at 04/04/2017 0841 Gross per 24 hour  Intake 895 ml  Output 550 ml  Net 345 ml   Filed Weights   04/02/17 2100 04/03/17 0513 04/04/17 0500  Weight: 56.5 kg (124 lb 9 oz) 57.1 kg (125 lb 14.1 oz) 55.5 kg (122 lb 6.4 oz)   General: Alert, Awake and Oriented to Time, Place and Person. Appear in moderate distress, affect flat Eyes: PERRL, Conjunctiva normal ENT: Oral  Mucosa clear moist. Neck: difficult to assess JVD, no Abnormal Mass Or lumps Cardiovascular: S1 and S2 Present, no Murmur, Peripheral Pulses Present Respiratory: Increase respiratory effort, Bilateral Air entry equal and Decreased, no use of accessory muscle, bilateral Crackles, no wheezes Abdomen: Bowel Sound present, Soft and no tenderness, no hernia Skin: no redness, no Rash, no induration Extremities: trace Pedal edema, no calf tenderness Neurologic: Grossly no focal neuro deficit. Bilaterally Equal motor strength  Data Reviewed: CBC: Recent Labs  Lab 04/02/17 1215 04/03/17 0731 04/04/17 0448  WBC 8.6 12.5* 12.0*  HGB 10.7* 12.6 12.7  HCT 35.0* 39.6 40.4  MCV 87.7 85.0 86.5  PLT 303 388 372   Basic Metabolic Panel: Recent Labs  Lab 04/02/17 1215 04/03/17 0731 04/04/17 0448  NA 138 137 136  K 4.5 3.6 3.3*  CL 103 99* 97*  CO2 25 23 24   GLUCOSE 149* 108* 118*  BUN 15 13 23*  CREATININE 0.88 0.84 1.29*  CALCIUM 8.9 9.5 9.3    Liver Function Tests: No results for input(s): AST, ALT, ALKPHOS, BILITOT, PROT, ALBUMIN in the last 168 hours. No results for input(s): LIPASE, AMYLASE in the last 168 hours. No results for input(s): AMMONIA in the last 168 hours. Coagulation Profile: No results for input(s): INR, PROTIME in the last 168 hours. Cardiac Enzymes: No results for input(s): CKTOTAL, CKMB, CKMBINDEX, TROPONINI in the last 168 hours. BNP (last 3 results) No results for input(s): PROBNP in the last 8760 hours. CBG: No results for input(s): GLUCAP in the last 168 hours. Studies: No results found.  Scheduled Meds: . atorvastatin  40 mg Oral QHS  . ciprofloxacin  500 mg Oral Q2000  . clopidogrel  75 mg Oral Daily  . dorzolamide-timolol  1 drop Both Eyes BID  . enoxaparin (LOVENOX) injection  30 mg Subcutaneous Q24H  . OLANZapine  2.5 mg Oral QHS   And  . FLUoxetine  20 mg Oral QHS  . latanoprost  1 drop Both Eyes QHS  . lidocaine  2 patch Transdermal Q24H  .  methocarbamol  500 mg Oral TID  . metoprolol tartrate  25 mg Oral BID  . Netarsudil Dimesylate  1 drop Left Eye QHS  . oxymorphone  10 mg Oral BID  . pantoprazole  40 mg Oral Daily  . polyvinyl alcohol  1 drop Both Eyes QID  . pramipexole  0.5 mg Oral QHS,MR X 1  . prednisoLONE acetate  1 drop Right Eye QID  . sodium chloride flush  3 mL Intravenous Q12H   Continuous Infusions: . sodium chloride     PRN Meds: sodium chloride, acetaminophen **OR** acetaminophen, ALPRAZolam, ipratropium-albuterol, ondansetron **OR** ondansetron (ZOFRAN) IV, oxyCODONE, senna-docusate, sodium chloride flush  Time spent:  Author: Lynden Oxford, MD [facility] Pager: [phone] 04/04/2017 10:55 AM  If 7PM-7AM, please contact night-coverage at [email or web address], password [facility]

## 2017-04-04 NOTE — Progress Notes (Signed)
SATURATION QUALIFICATIONS: (This note is used to comply with regulatory documentation for home oxygen)  Patient Saturations on Room Air at Rest = 96%  Patient Saturations on Room Air while Ambulating = 84%  Patient Saturations on 2 Liters of oxygen while Ambulating = 95%  Please briefly explain why patient needs home oxygen:  Pt does qualify for home O2, as she desatted to 84% on room air while ambulating.

## 2017-04-04 NOTE — Plan of Care (Signed)
  Education: Knowledge of General Education information will improve 04/04/2017 1021 - Progressing by Loel Lofty, RN

## 2017-04-04 NOTE — Progress Notes (Signed)
Have notified respiratory team that orders have been placed for flutter valve for pt.

## 2017-04-04 NOTE — Plan of Care (Signed)
  Health Behavior/Discharge Planning: Ability to manage health-related needs will improve 04/04/2017 2323 - Progressing by Jeanella Flattery, RN   Education: Knowledge of General Education information will improve 04/04/2017 2323 - Progressing by Jeanella Flattery, RN

## 2017-04-05 LAB — BASIC METABOLIC PANEL
Anion gap: 11 (ref 5–15)
BUN: 27 mg/dL — AB (ref 6–20)
CHLORIDE: 100 mmol/L — AB (ref 101–111)
CO2: 27 mmol/L (ref 22–32)
CREATININE: 1.23 mg/dL — AB (ref 0.44–1.00)
Calcium: 9.2 mg/dL (ref 8.9–10.3)
GFR calc Af Amer: 44 mL/min — ABNORMAL LOW (ref >60)
GFR calc non Af Amer: 38 mL/min — ABNORMAL LOW (ref >60)
Glucose, Bld: 104 mg/dL — ABNORMAL HIGH (ref 65–99)
Potassium: 4.2 mmol/L (ref 3.5–5.1)
SODIUM: 138 mmol/L (ref 135–145)

## 2017-04-05 LAB — CBC
HEMATOCRIT: 40.9 % (ref 36.0–46.0)
Hemoglobin: 12.7 g/dL (ref 12.0–15.0)
MCH: 27.3 pg (ref 26.0–34.0)
MCHC: 31.1 g/dL (ref 30.0–36.0)
MCV: 88 fL (ref 78.0–100.0)
PLATELETS: 363 10*3/uL (ref 150–400)
RBC: 4.65 MIL/uL (ref 3.87–5.11)
RDW: 17.3 % — AB (ref 11.5–15.5)
WBC: 11.4 10*3/uL — ABNORMAL HIGH (ref 4.0–10.5)

## 2017-04-05 LAB — MAGNESIUM: Magnesium: 2.1 mg/dL (ref 1.7–2.4)

## 2017-04-05 MED ORDER — METHOCARBAMOL 500 MG PO TABS: 500.0000 mg | ORAL_TABLET | Freq: Three times a day (TID) | ORAL | 0 refills | Status: AC

## 2017-04-05 MED ORDER — GUAIFENESIN ER 600 MG PO TB12: 600.0000 mg | ORAL_TABLET | Freq: Two times a day (BID) | ORAL | 0 refills | Status: AC

## 2017-04-05 MED ORDER — FOSFOMYCIN TROMETHAMINE 3 G PO PACK: 3.0000 g | PACK | Freq: Once | ORAL | 0 refills | Status: AC

## 2017-04-05 MED ORDER — LIDOCAINE 5 % EX PTCH: 2.0000 | MEDICATED_PATCH | CUTANEOUS | 0 refills | Status: AC

## 2017-04-05 NOTE — Care Management Note (Addendum)
Case Management Note  Patient Details  Name: Alexis Yates MRN: [id number] Date of Birth: 08-08-1927  Subjective/Objective:  Acute Hypoemic Resp Failure                 Action/Plan: PCP: Marden Noble, MD; has private insurance with [facility] with prescription drug coverage; pharmacy of choice is [facility] in [place]; she has personal caregivers; HHC choice offered to patient and daughter, they chose [facility]; Dan with [facility] called for arrangements and home oxygen; DME at home - walker, cane and bedside commode.   11:00 am- Received call from Dan with [facility], patient already have Oxygen at home- he will bring a portable oxygen tank for home at discharge; Alexis Goodell [phone]  Expected Discharge Date:  04/05/17               Expected Discharge Plan:  Home w Home Health Services  Discharge planning Services  CM Consult  DME Arranged:  Oxygen DME Agency:  [facility]  HH Arranged:  PT [facility] Agency:  [facility]  Status of Service:  In process, will continue to follow  Reola Mosher [phone] 04/05/2017, 10:31 AM

## 2017-04-05 NOTE — Discharge Summary (Signed)
[facility] Discharge Summary   Patient: Alexis Yates WLN:[id number]   PCP: Marden Noble, MD DOB: 06/15/1927   Date of admission: 04/02/2017   Date of discharge: 04/05/2017    Discharge Diagnoses:  Principal Problem:   Acute hypoxemic respiratory failure (HCC) Active Problems:   History of MI (myocardial infarction)   Depression with anxiety   DOE (dyspnea on exertion)   Dyslipidemia, goal LDL below 70   Chronic diastolic CHF (congestive heart failure) (HCC)   OSA (obstructive sleep apnea)   Periodic limb movements of sleep   Acute and chronic respiratory failure with hypoxia (HCC)   Admitted From: home Disposition:  home with home health  Recommendations for Outpatient Follow-up:  1. Please follow-up with PCP in 1 week.  Follow-up Information    Marden Noble, MD. Go on 04/12/2017.   Specialty:  Internal Medicine Why:  @10 :15am Contact information: [address] [place] [address] [phone]          Diet recommendation: Cardiac diet  Activity: The patient is advised to gradually reintroduce usual activities.  Discharge Condition: good  Code Status: Full code  History of present illness: As per the H and P dictated on admission, "Alexis Yates is a 84 y.o. female with medical history significant of chronic hypoxic/hypercapnic respiratory failure with restrictive lung disease from kyphoscoliosis, mild COPD with emphysema, coronary artery disease, pulmonary hypertension, hypertension, restless leg syndrome.  She presents with one-week history of shortness of breath.  She states that she wears 2 L of nasal cannula O2 at nighttime and intermittently during the day but not all the time.  She has been having back pain and epigastric abdominal pain.  She states that anytime she exerts herself, she gets very winded and needs time to catch her breath.  She denies any fevers or cough, she denies any chest pain.  She had episodes of constipation and  diarrhea but that has resolved.  She currently sleeps with 3 pillows as this is more comfortable for her.  She tells me that she follows with Dr. Craige Cotta her pulmonologist.  She recently had a sleep study but states that it found a mild sleep apnea and did not require CPAP.  However, on chart review from Dr. Evlyn Courier progress note on 12/31/2016, she was diagnosed with moderate sleep apnea and was recommended for CPAP, which she had declined."  Hospital Course:  Summary of her active problems in the hospital is as following. Acute on chronic hypoxic, respiratory failure Acute on chronic diastolic CHF. -Uses 2 L of oxygen at nighttime. Ambulatory pulse ox in the emergency department was 80% on room air.   On ambulatory evaluation still drops to 84% requiring 2 L of oxygen, patient was initially tachypneic.  Breathing better today on 04/05/2017.  -CTA chest negative for PE, no consolidation -BNP 663  She was given 20mg  IV lasix in the ED.  Patient was given IV Lasix 20 mg twice daily yesterday.  Serum creatinine has gone up today. Will transition to oral Lasix starting tomorrow hold off on further diuresis today given renal dysfunction. -Add incentive spirometry, flutter device, Mucinex.  -On chart review, patientfollows with Dr. Melford Aase diagnosed with chronic hypoxic, hypercapnic respiratory failure with restrictive lung disease due to kyphoscoliosis as well as mild COPD with emphysema,group 2 and 3 pulmonary hypertension,moderate sleep apnea which patient had declined CPAP therapy at the time -Echocardiogram obtained on 09/25/2016 was significant for an EF range of 65-70% with no regional  wall motion abnormalities and grade 1 diastolic dysfunction  It appears that the patient's tachypnea as well as fatigue was more likely due to her progressive lung disease requiring hypoxia along with uncontrolled pain causing tachypnea.  And patient shows improvement today with control of pain. Recommend to  continue current management as an outpatient and follow-up with PCP.  E Coli UTI. Penicillin allergic with anaphylaxis. Started on Cipro. E. coli resistant to Cipro.  Treat with fosfomycin x2.  HLD -Continue lipitor   CAD -Continue plavix  RLS -Continue mirapex  Hypokalemia. In response to diuresis.  All other chronic medical condition were stable during the hospitalization.  Patient was seen by physical therapy, who recommended home health, which was arranged by Child psychotherapist and case Production designer, theatre/television/film. On the day of the discharge the patient's vitals were stable, and no other acute medical condition were reported by patient. the patient was felt safe to be discharge at home with hone helath.  Procedures and Results:  none   Consultations:  none  DISCHARGE MEDICATION: Allergies as of 04/05/2017      Reactions   Penicillins Anaphylaxis   Has patient had a PCN reaction causing immediate rash, facial/tongue/throat swelling, SOB or lightheadedness with hypotension: Yes Has patient had a PCN reaction causing severe rash involving mucus membranes or skin necrosis: No Has patient had a PCN reaction that required hospitalization Yes Has patient had a PCN reaction occurring within the last 10 years: No If all of the above answers are "NO", then may proceed with Cephalosporin use.   Phenergan [promethazine Hcl] Other (See Comments)   Restless legs      Medication List    STOP taking these medications   celecoxib 200 MG capsule Commonly known as:  CELEBREX   indomethacin 25 MG capsule Commonly known as:  INDOCIN     TAKE these medications   ALPRAZolam 0.25 MG tablet Commonly known as:  XANAX Take 0.25 mg by mouth at bedtime as needed for anxiety or sleep.   atorvastatin 40 MG tablet Commonly known as:  LIPITOR Take 40 mg by mouth at bedtime.   bimatoprost 0.01 % Soln Commonly known as:  LUMIGAN Place 1 drop into both eyes at bedtime.   CALCIUM-D PO Take 1 tablet by  mouth at bedtime.   clopidogrel 75 MG tablet Commonly known as:  PLAVIX Take 1 tablet (75 mg total) by mouth daily. What changed:  when to take this   docusate sodium 100 MG capsule Commonly known as:  COLACE Take 100 mg by mouth 2 (two) times daily.   dorzolamide-timolol 22.3-6.8 MG/ML ophthalmic solution Commonly known as:  COSOPT Place 1 drop into both eyes 2 (two) times daily.   esomeprazole 40 MG capsule Commonly known as:  NEXIUM Take 40 mg by mouth daily.   fosfomycin 3 g Pack Commonly known as:  MONUROL Take 3 g by mouth once for 1 dose. Start taking on:  04/07/2017   furosemide 20 MG tablet Commonly known as:  LASIX Take 0.5 tablets (10 mg total) by mouth daily.   guaiFENesin 600 MG 12 hr tablet Commonly known as:  MUCINEX Take 1 tablet (600 mg total) by mouth 2 (two) times daily.   ipratropium-albuterol 0.5-2.5 (3) MG/3ML Soln Commonly known as:  DUONEB Take 3 mLs by nebulization every 6 (six) hours as needed.   lidocaine 5 % Commonly known as:  LIDODERM Place 2 patches onto the skin daily. Remove & Discard patch within 12 hours or as directed by  MD   methocarbamol 500 MG tablet Commonly known as:  ROBAXIN Take 1 tablet (500 mg total) by mouth 3 (three) times daily.   metoprolol tartrate 25 MG tablet Commonly known as:  LOPRESSOR Take 25 mg by mouth 2 (two) times daily.   multivitamin with minerals Tabs tablet Take 1 tablet by mouth daily.   OLANZapine-FLUoxetine 3-25 MG capsule Commonly known as:  SYMBYAX Take 1 capsule by mouth at bedtime.   oxyCODONE 5 MG immediate release tablet Commonly known as:  Oxy IR/ROXICODONE Take 1 tablet (5 mg total) by mouth 3 (three) times daily as needed for severe pain. What changed:    when to take this  reasons to take this   OXYGEN Inhale 2 L into the lungs See admin instructions. Use nightly while sleeping, may also uses during the day as needed for shortness of breath   oxymorphone 10 MG  tablet Commonly known as:  OPANA Take 10 mg by mouth 2 (two) times daily.   pramipexole 0.5 MG tablet Commonly known as:  MIRAPEX Take 0.5 mg by mouth See admin instructions. Take one tablet (0.5 mg) by mouth daily at bedtime, may take another dose one hour later as needed for restless legs   prednisoLONE acetate 1 % ophthalmic suspension Commonly known as:  PRED FORTE Place 1 drop into the right eye 4 (four) times daily.   RHOPRESSA 0.02 % Soln Generic drug:  Netarsudil Dimesylate Place 1 drop into the left eye at bedtime.   SYSTANE 0.4-0.3 % Soln Generic drug:  Polyethyl Glycol-Propyl Glycol Place 1 drop into both eyes 4 (four) times daily.            Durable Medical Equipment  (From admission, onward)        Start     Ordered   04/05/17 0746  DME Oxygen  Once    Question Answer Comment  Mode or (Route) Nasal cannula   Liters per Minute 2   Frequency Continuous (stationary and portable oxygen unit needed)   Oxygen delivery system Gas      04/05/17 0746     Allergies  Allergen Reactions  . Penicillins Anaphylaxis    Has patient had a PCN reaction causing immediate rash, facial/tongue/throat swelling, SOB or lightheadedness with hypotension: Yes Has patient had a PCN reaction causing severe rash involving mucus membranes or skin necrosis: No Has patient had a PCN reaction that required hospitalization Yes Has patient had a PCN reaction occurring within the last 10 years: No If all of the above answers are "NO", then may proceed with Cephalosporin use.   Marland Kitchen Phenergan [Promethazine Hcl] Other (See Comments)    Restless legs   Discharge Instructions    Diet - low sodium heart healthy   Complete by:  As directed    Discharge instructions   Complete by:  As directed    It is important that you read following instructions as well as go over your medication list with RN to help you understand your care after this hospitalization.  Discharge Instructions: Please  follow-up with PCP in one week  Please request your primary care physician to go over all Hospital Tests and Procedure/Radiological results at the follow up,  Please get all Hospital records sent to your PCP by signing hospital release before you go home.   Do not take more than prescribed Pain, Sleep and Anxiety Medications. You were cared for by a hospitalist during your hospital stay. If you have any questions about your  discharge medications or the care you received while you were in the hospital after you are discharged, you can call the unit and ask to speak with the hospitalist on call if the hospitalist that took care of you is not available.  Once you are discharged, your primary care physician will handle any further medical issues. Please note that NO REFILLS for any discharge medications will be authorized once you are discharged, as it is imperative that you return to your primary care physician (or establish a relationship with a primary care physician if you do not have one) for your aftercare needs so that they can reassess your need for medications and monitor your lab values. You Must read complete instructions/literature along with all the possible adverse reactions/side effects for all the Medicines you take and that have been prescribed to you. Take any new Medicines after you have completely understood and accept all the possible adverse reactions/side effects. Wear Seat belts while driving. If you have smoked or chewed Tobacco in the last 2 yrs please stop smoking and/or stop any Recreational drug use.   Increase activity slowly   Complete by:  As directed      Discharge Exam: Filed Weights   04/03/17 0513 04/04/17 0500 04/05/17 0600  Weight: 57.1 kg (125 lb 14.1 oz) 55.5 kg (122 lb 6.4 oz) 56 kg (123 lb 6.4 oz)   Vitals:   04/04/17 2034 04/05/17 0551  BP: 119/75 (!) 157/81  Pulse: 70 66  Resp: 16 18  Temp: (!) 97.4 F (36.3 C) 97.8 F (36.6 C)  SpO2: 92% 93%    General: Appear in mild distress, no Rash; Oral Mucosa moist. Cardiovascular: S1 and S2 Present, no Murmur, no JVD Respiratory: Bilateral Air entry present and Clear to Auscultation, no Crackles, no wheezes Abdomen: Bowel Sound present, Soft and no tenderness Extremities: no Pedal edema, no calf tenderness Neurology: Grossly no focal neuro deficit.  The results of significant diagnostics from this hospitalization (including imaging, microbiology, ancillary and laboratory) are listed below for reference.    Significant Diagnostic Studies: Dg Chest 2 View  Result Date: 04/02/2017 CLINICAL DATA:  Acute mid chest pain, weakness EXAM: CHEST  2 VIEW COMPARISON:  03/31/2017 FINDINGS: Stable cardiomegaly with mild vascular congestion and nonspecific interstitial prominence. No definite CHF, edema, effusion, or pneumothorax. No focal pneumonia, collapse or consolidation. Aorta atherosclerotic and tortuous. Degenerative changes of the spine with associated scoliosis. Arthropathy of both shoulders with a right shoulder arthroplasty noted with chronic superior subluxation. Trachea is midline. Multilevel thoracic compression deformities as before with increased thoracic kyphosis. IMPRESSION: Stable chest exam without superimposed acute process. Electronically Signed   By: Judie Petit.  Shick M.D.   On: 04/02/2017 12:56   Dg Chest 2 View  Result Date: 03/31/2017 CLINICAL DATA:  Short of breath for several days, chest pain, history of coronary artery stent EXAM: CHEST  2 VIEW COMPARISON:  Portable chest x-ray of 09/26/2016 FINDINGS: The lungs are clear but hyperaerated. No pneumonia or effusion is seen. Mediastinal and hilar contours are unremarkable and mild cardiomegaly is stable. The descending thoracic aorta is ectatic. There is a thoracolumbar scoliosis present. Probable old compression deformity of T8 vertebral body is noted. Right shoulder replacement is present and there are degenerative changes in the left  shoulder. IMPRESSION: 1. No active cardiopulmonary disease.  Stable mild cardiomegaly. 2. Old compression deformity of T8 vertebral body. Electronically Signed   By: Dwyane Dee M.D.   On: 03/31/2017 16:24   Ct Angio Chest  Pe W And/or Wo Contrast  Result Date: 04/02/2017 CLINICAL DATA:  Short of breath, back pain for 1 week. Concern for pulmonary embolism. EXAM: CT ANGIOGRAPHY CHEST WITH CONTRAST TECHNIQUE: Multidetector CT imaging of the chest was performed using the standard protocol during bolus administration of intravenous contrast. Multiplanar CT image reconstructions and MIPs were obtained to evaluate the vascular anatomy. CONTRAST:  50mL ISOVUE-370 IOPAMIDOL (ISOVUE-370) INJECTION 76% COMPARISON:  CT chest 09/23/2016 FINDINGS: Cardiovascular: No filling defects within the pulmonary arteries arteries to suggest acute pulmonary embolism. Aorta is not opacified. Mediastinum/Nodes: No axillary supraclavicular adenopathy. No mediastinal hilar adenopathy. Esophagus normal. Lungs/Pleura: No airspace disease. No suspicious pulmonary nodularity. Mild interlobular septal thickening. Mild ground-glass opacities. Peripheral nodule in the RIGHT lower lobe measures 5 mm (image 54, series 6). This not changed from comparison exam Upper Abdomen: Limited view of the liver, kidneys, pancreas are unremarkable. Normal adrenal glands. Musculoskeletal: Kyphoscoliosis. Compression fracture in the midthoracic spine is chronic. Review of the MIP images confirms the above findings. IMPRESSION: 1. No evidence acute pulmonary embolism. 2. Mild interstitial edema. 3. Stable pulmonary nodule in the RIGHT lower lobe. Aortic Atherosclerosis (ICD10-I70.0). Electronically Signed   By: Genevive Bi M.D.   On: 04/02/2017 15:32    Microbiology: Recent Results (from the past 240 hour(s))  Urine culture     Status: Abnormal   Collection Time: 04/02/17  3:37 PM  Result Value Ref Range Status   Specimen Description URINE, CLEAN  CATCH  Final   Special Requests NONE  Final   Culture >=100,000 COLONIES/mL ESCHERICHIA COLI (A)  Final   Report Status 04/04/2017 FINAL  Final   Organism ID, Bacteria ESCHERICHIA COLI (A)  Final      Susceptibility   Escherichia coli - MIC*    AMPICILLIN >=32 RESISTANT Resistant     CEFAZOLIN <=4 SENSITIVE Sensitive     CEFTRIAXONE <=1 SENSITIVE Sensitive     CIPROFLOXACIN >=4 RESISTANT Resistant     GENTAMICIN <=1 SENSITIVE Sensitive     IMIPENEM <=0.25 SENSITIVE Sensitive     NITROFURANTOIN <=16 SENSITIVE Sensitive     TRIMETH/SULFA >=320 RESISTANT Resistant     AMPICILLIN/SULBACTAM 4 SENSITIVE Sensitive     PIP/TAZO <=4 SENSITIVE Sensitive     Extended ESBL NEGATIVE Sensitive     * >=100,000 COLONIES/mL ESCHERICHIA COLI     Labs: CBC: Recent Labs  Lab 04/02/17 1215 04/03/17 0731 04/04/17 0448 04/05/17 0526  WBC 8.6 12.5* 12.0* 11.4*  HGB 10.7* 12.6 12.7 12.7  HCT 35.0* 39.6 40.4 40.9  MCV 87.7 85.0 86.5 88.0  PLT 303 388 372 363   Basic Metabolic Panel: Recent Labs  Lab 04/02/17 1215 04/03/17 0731 04/04/17 0448 04/05/17 0526  NA 138 137 136 138  K 4.5 3.6 3.3* 4.2  CL 103 99* 97* 100*  CO2 25 23 24 27   GLUCOSE 149* 108* 118* 104*  BUN 15 13 23* 27*  CREATININE 0.88 0.84 1.29* 1.23*  CALCIUM 8.9 9.5 9.3 9.2  MG  --   --   --  2.1   Liver Function Tests: No results for input(s): AST, ALT, ALKPHOS, BILITOT, PROT, ALBUMIN in the last 168 hours. No results for input(s): LIPASE, AMYLASE in the last 168 hours. No results for input(s): AMMONIA in the last 168 hours. Cardiac Enzymes: No results for input(s): CKTOTAL, CKMB, CKMBINDEX, TROPONINI in the last 168 hours. BNP (last 3 results) Recent Labs    04/12/16 1150 09/22/16 2050 04/02/17 1539  BNP 94.3 398.8* 663.3*  CBG: No results for input(s): GLUCAP in the last 168 hours. Time spent: 35 minutes  Signed:  Lynden Oxford  [facility] 04/05/2017 , 12:28 PM

## 2017-04-05 NOTE — Plan of Care (Signed)
  Education: Knowledge of General Education information will improve 04/05/2017 0931 - Progressing by Loel Lofty, RN

## 2017-04-05 NOTE — Progress Notes (Signed)
Orders received for pt discharge.  Discharge summary printed and reviewed with pt.  Explained medication regimen, and pt had no further questions at this time.  IV removed and site remains clean, dry, intact.  Telemetry removed.  Pt in stable condition and awaiting transport. 

## 2017-04-22 ENCOUNTER — Ambulatory Visit
Admission: RE | Admit: 2017-04-22 | Discharge: 2017-04-22 | Disposition: A | Discharge status: Home / Self Care | Payer: Medicare Other | Source: Ambulatory Visit | Attending: Internal Medicine | Admitting: Internal Medicine

## 2017-04-22 ENCOUNTER — Other Ambulatory Visit: Payer: Self-pay | Admitting: Internal Medicine

## 2017-04-22 DIAGNOSIS — M546 Pain in thoracic spine: Secondary | ICD-10-CM

## 2017-04-22 DIAGNOSIS — J9691 Respiratory failure, unspecified with hypoxia: Secondary | ICD-10-CM

## 2017-04-22 DIAGNOSIS — R071 Chest pain on breathing: Secondary | ICD-10-CM

## 2017-04-26 DIAGNOSIS — I2510 Atherosclerotic heart disease of native coronary artery without angina pectoris: Secondary | ICD-10-CM | POA: Diagnosis not present

## 2017-04-26 DIAGNOSIS — Z955 Presence of coronary angioplasty implant and graft: Secondary | ICD-10-CM | POA: Diagnosis not present

## 2017-04-26 DIAGNOSIS — J9691 Respiratory failure, unspecified with hypoxia: Secondary | ICD-10-CM | POA: Diagnosis not present

## 2017-04-26 DIAGNOSIS — I10 Essential (primary) hypertension: Secondary | ICD-10-CM | POA: Diagnosis not present

## 2017-04-26 DIAGNOSIS — R918 Other nonspecific abnormal finding of lung field: Secondary | ICD-10-CM | POA: Diagnosis not present

## 2017-04-26 DIAGNOSIS — S22060S Wedge compression fracture of T7-T8 vertebra, sequela: Secondary | ICD-10-CM | POA: Diagnosis not present

## 2017-04-26 DIAGNOSIS — H409 Unspecified glaucoma: Secondary | ICD-10-CM | POA: Diagnosis not present

## 2017-04-26 DIAGNOSIS — M159 Polyosteoarthritis, unspecified: Secondary | ICD-10-CM | POA: Diagnosis not present

## 2017-04-27 DIAGNOSIS — R918 Other nonspecific abnormal finding of lung field: Secondary | ICD-10-CM | POA: Diagnosis not present

## 2017-04-27 DIAGNOSIS — Z955 Presence of coronary angioplasty implant and graft: Secondary | ICD-10-CM | POA: Diagnosis not present

## 2017-04-27 DIAGNOSIS — S22060S Wedge compression fracture of T7-T8 vertebra, sequela: Secondary | ICD-10-CM | POA: Diagnosis not present

## 2017-04-27 DIAGNOSIS — I2510 Atherosclerotic heart disease of native coronary artery without angina pectoris: Secondary | ICD-10-CM | POA: Diagnosis not present

## 2017-04-27 DIAGNOSIS — I10 Essential (primary) hypertension: Secondary | ICD-10-CM | POA: Diagnosis not present

## 2017-04-27 DIAGNOSIS — J9691 Respiratory failure, unspecified with hypoxia: Secondary | ICD-10-CM | POA: Diagnosis not present

## 2017-04-28 DIAGNOSIS — R918 Other nonspecific abnormal finding of lung field: Secondary | ICD-10-CM | POA: Diagnosis not present

## 2017-04-28 DIAGNOSIS — Z955 Presence of coronary angioplasty implant and graft: Secondary | ICD-10-CM | POA: Diagnosis not present

## 2017-04-28 DIAGNOSIS — J9691 Respiratory failure, unspecified with hypoxia: Secondary | ICD-10-CM | POA: Diagnosis not present

## 2017-04-28 DIAGNOSIS — H401133 Primary open-angle glaucoma, bilateral, severe stage: Secondary | ICD-10-CM | POA: Diagnosis not present

## 2017-04-28 DIAGNOSIS — I2510 Atherosclerotic heart disease of native coronary artery without angina pectoris: Secondary | ICD-10-CM | POA: Diagnosis not present

## 2017-04-28 DIAGNOSIS — S22060S Wedge compression fracture of T7-T8 vertebra, sequela: Secondary | ICD-10-CM | POA: Diagnosis not present

## 2017-04-28 DIAGNOSIS — I10 Essential (primary) hypertension: Secondary | ICD-10-CM | POA: Diagnosis not present

## 2017-05-04 DIAGNOSIS — R918 Other nonspecific abnormal finding of lung field: Secondary | ICD-10-CM | POA: Diagnosis not present

## 2017-05-04 DIAGNOSIS — I2510 Atherosclerotic heart disease of native coronary artery without angina pectoris: Secondary | ICD-10-CM | POA: Diagnosis not present

## 2017-05-04 DIAGNOSIS — I10 Essential (primary) hypertension: Secondary | ICD-10-CM | POA: Diagnosis not present

## 2017-05-04 DIAGNOSIS — Z955 Presence of coronary angioplasty implant and graft: Secondary | ICD-10-CM | POA: Diagnosis not present

## 2017-05-04 DIAGNOSIS — J9691 Respiratory failure, unspecified with hypoxia: Secondary | ICD-10-CM | POA: Diagnosis not present

## 2017-05-04 DIAGNOSIS — S22060S Wedge compression fracture of T7-T8 vertebra, sequela: Secondary | ICD-10-CM | POA: Diagnosis not present

## 2017-05-05 DIAGNOSIS — S22060S Wedge compression fracture of T7-T8 vertebra, sequela: Secondary | ICD-10-CM | POA: Diagnosis not present

## 2017-05-05 DIAGNOSIS — J9691 Respiratory failure, unspecified with hypoxia: Secondary | ICD-10-CM | POA: Diagnosis not present

## 2017-05-05 DIAGNOSIS — Z955 Presence of coronary angioplasty implant and graft: Secondary | ICD-10-CM | POA: Diagnosis not present

## 2017-05-05 DIAGNOSIS — I10 Essential (primary) hypertension: Secondary | ICD-10-CM | POA: Diagnosis not present

## 2017-05-05 DIAGNOSIS — R918 Other nonspecific abnormal finding of lung field: Secondary | ICD-10-CM | POA: Diagnosis not present

## 2017-05-05 DIAGNOSIS — I2510 Atherosclerotic heart disease of native coronary artery without angina pectoris: Secondary | ICD-10-CM | POA: Diagnosis not present

## 2017-05-06 DIAGNOSIS — S22060S Wedge compression fracture of T7-T8 vertebra, sequela: Secondary | ICD-10-CM | POA: Diagnosis not present

## 2017-05-06 DIAGNOSIS — Z955 Presence of coronary angioplasty implant and graft: Secondary | ICD-10-CM | POA: Diagnosis not present

## 2017-05-06 DIAGNOSIS — R918 Other nonspecific abnormal finding of lung field: Secondary | ICD-10-CM | POA: Diagnosis not present

## 2017-05-06 DIAGNOSIS — I10 Essential (primary) hypertension: Secondary | ICD-10-CM | POA: Diagnosis not present

## 2017-05-06 DIAGNOSIS — I2510 Atherosclerotic heart disease of native coronary artery without angina pectoris: Secondary | ICD-10-CM | POA: Diagnosis not present

## 2017-05-06 DIAGNOSIS — J9691 Respiratory failure, unspecified with hypoxia: Secondary | ICD-10-CM | POA: Diagnosis not present

## 2017-05-08 DIAGNOSIS — S22060S Wedge compression fracture of T7-T8 vertebra, sequela: Secondary | ICD-10-CM | POA: Diagnosis not present

## 2017-05-08 DIAGNOSIS — Z955 Presence of coronary angioplasty implant and graft: Secondary | ICD-10-CM | POA: Diagnosis not present

## 2017-05-08 DIAGNOSIS — R918 Other nonspecific abnormal finding of lung field: Secondary | ICD-10-CM | POA: Diagnosis not present

## 2017-05-08 DIAGNOSIS — I2510 Atherosclerotic heart disease of native coronary artery without angina pectoris: Secondary | ICD-10-CM | POA: Diagnosis not present

## 2017-05-08 DIAGNOSIS — I10 Essential (primary) hypertension: Secondary | ICD-10-CM | POA: Diagnosis not present

## 2017-05-08 DIAGNOSIS — J9691 Respiratory failure, unspecified with hypoxia: Secondary | ICD-10-CM | POA: Diagnosis not present

## 2017-05-10 DIAGNOSIS — S22060S Wedge compression fracture of T7-T8 vertebra, sequela: Secondary | ICD-10-CM | POA: Diagnosis not present

## 2017-05-10 DIAGNOSIS — I10 Essential (primary) hypertension: Secondary | ICD-10-CM | POA: Diagnosis not present

## 2017-05-10 DIAGNOSIS — R918 Other nonspecific abnormal finding of lung field: Secondary | ICD-10-CM | POA: Diagnosis not present

## 2017-05-10 DIAGNOSIS — J9691 Respiratory failure, unspecified with hypoxia: Secondary | ICD-10-CM | POA: Diagnosis not present

## 2017-05-10 DIAGNOSIS — I2510 Atherosclerotic heart disease of native coronary artery without angina pectoris: Secondary | ICD-10-CM | POA: Diagnosis not present

## 2017-05-10 DIAGNOSIS — Z955 Presence of coronary angioplasty implant and graft: Secondary | ICD-10-CM | POA: Diagnosis not present

## 2017-05-11 DIAGNOSIS — I2510 Atherosclerotic heart disease of native coronary artery without angina pectoris: Secondary | ICD-10-CM | POA: Diagnosis not present

## 2017-05-11 DIAGNOSIS — I10 Essential (primary) hypertension: Secondary | ICD-10-CM | POA: Diagnosis not present

## 2017-05-11 DIAGNOSIS — J9691 Respiratory failure, unspecified with hypoxia: Secondary | ICD-10-CM | POA: Diagnosis not present

## 2017-05-11 DIAGNOSIS — Z955 Presence of coronary angioplasty implant and graft: Secondary | ICD-10-CM | POA: Diagnosis not present

## 2017-05-11 DIAGNOSIS — S22060S Wedge compression fracture of T7-T8 vertebra, sequela: Secondary | ICD-10-CM | POA: Diagnosis not present

## 2017-05-11 DIAGNOSIS — R918 Other nonspecific abnormal finding of lung field: Secondary | ICD-10-CM | POA: Diagnosis not present

## 2017-05-12 DIAGNOSIS — I2510 Atherosclerotic heart disease of native coronary artery without angina pectoris: Secondary | ICD-10-CM | POA: Diagnosis not present

## 2017-05-12 DIAGNOSIS — I10 Essential (primary) hypertension: Secondary | ICD-10-CM | POA: Diagnosis not present

## 2017-05-12 DIAGNOSIS — J9691 Respiratory failure, unspecified with hypoxia: Secondary | ICD-10-CM | POA: Diagnosis not present

## 2017-05-12 DIAGNOSIS — R918 Other nonspecific abnormal finding of lung field: Secondary | ICD-10-CM | POA: Diagnosis not present

## 2017-05-12 DIAGNOSIS — S22060S Wedge compression fracture of T7-T8 vertebra, sequela: Secondary | ICD-10-CM | POA: Diagnosis not present

## 2017-05-12 DIAGNOSIS — Z955 Presence of coronary angioplasty implant and graft: Secondary | ICD-10-CM | POA: Diagnosis not present

## 2017-05-13 DIAGNOSIS — Z955 Presence of coronary angioplasty implant and graft: Secondary | ICD-10-CM | POA: Diagnosis not present

## 2017-05-13 DIAGNOSIS — I2510 Atherosclerotic heart disease of native coronary artery without angina pectoris: Secondary | ICD-10-CM | POA: Diagnosis not present

## 2017-05-13 DIAGNOSIS — J9691 Respiratory failure, unspecified with hypoxia: Secondary | ICD-10-CM | POA: Diagnosis not present

## 2017-05-13 DIAGNOSIS — S22060S Wedge compression fracture of T7-T8 vertebra, sequela: Secondary | ICD-10-CM | POA: Diagnosis not present

## 2017-05-13 DIAGNOSIS — R918 Other nonspecific abnormal finding of lung field: Secondary | ICD-10-CM | POA: Diagnosis not present

## 2017-05-13 DIAGNOSIS — H409 Unspecified glaucoma: Secondary | ICD-10-CM | POA: Diagnosis not present

## 2017-05-13 DIAGNOSIS — I10 Essential (primary) hypertension: Secondary | ICD-10-CM | POA: Diagnosis not present

## 2017-05-13 DIAGNOSIS — M159 Polyosteoarthritis, unspecified: Secondary | ICD-10-CM | POA: Diagnosis not present

## 2017-05-16 DIAGNOSIS — R918 Other nonspecific abnormal finding of lung field: Secondary | ICD-10-CM | POA: Diagnosis not present

## 2017-05-16 DIAGNOSIS — Z955 Presence of coronary angioplasty implant and graft: Secondary | ICD-10-CM | POA: Diagnosis not present

## 2017-05-16 DIAGNOSIS — S22060S Wedge compression fracture of T7-T8 vertebra, sequela: Secondary | ICD-10-CM | POA: Diagnosis not present

## 2017-05-16 DIAGNOSIS — I10 Essential (primary) hypertension: Secondary | ICD-10-CM | POA: Diagnosis not present

## 2017-05-16 DIAGNOSIS — J9691 Respiratory failure, unspecified with hypoxia: Secondary | ICD-10-CM | POA: Diagnosis not present

## 2017-05-16 DIAGNOSIS — I2510 Atherosclerotic heart disease of native coronary artery without angina pectoris: Secondary | ICD-10-CM | POA: Diagnosis not present

## 2017-05-19 DIAGNOSIS — R918 Other nonspecific abnormal finding of lung field: Secondary | ICD-10-CM | POA: Diagnosis not present

## 2017-05-19 DIAGNOSIS — Z955 Presence of coronary angioplasty implant and graft: Secondary | ICD-10-CM | POA: Diagnosis not present

## 2017-05-19 DIAGNOSIS — I10 Essential (primary) hypertension: Secondary | ICD-10-CM | POA: Diagnosis not present

## 2017-05-19 DIAGNOSIS — I2510 Atherosclerotic heart disease of native coronary artery without angina pectoris: Secondary | ICD-10-CM | POA: Diagnosis not present

## 2017-05-19 DIAGNOSIS — J9691 Respiratory failure, unspecified with hypoxia: Secondary | ICD-10-CM | POA: Diagnosis not present

## 2017-05-19 DIAGNOSIS — S22060S Wedge compression fracture of T7-T8 vertebra, sequela: Secondary | ICD-10-CM | POA: Diagnosis not present

## 2017-05-23 DIAGNOSIS — J9691 Respiratory failure, unspecified with hypoxia: Secondary | ICD-10-CM | POA: Diagnosis not present

## 2017-05-23 DIAGNOSIS — Z955 Presence of coronary angioplasty implant and graft: Secondary | ICD-10-CM | POA: Diagnosis not present

## 2017-05-23 DIAGNOSIS — I10 Essential (primary) hypertension: Secondary | ICD-10-CM | POA: Diagnosis not present

## 2017-05-23 DIAGNOSIS — S22060S Wedge compression fracture of T7-T8 vertebra, sequela: Secondary | ICD-10-CM | POA: Diagnosis not present

## 2017-05-23 DIAGNOSIS — I2510 Atherosclerotic heart disease of native coronary artery without angina pectoris: Secondary | ICD-10-CM | POA: Diagnosis not present

## 2017-05-23 DIAGNOSIS — R918 Other nonspecific abnormal finding of lung field: Secondary | ICD-10-CM | POA: Diagnosis not present

## 2017-05-26 DIAGNOSIS — I10 Essential (primary) hypertension: Secondary | ICD-10-CM | POA: Diagnosis not present

## 2017-05-26 DIAGNOSIS — S22060S Wedge compression fracture of T7-T8 vertebra, sequela: Secondary | ICD-10-CM | POA: Diagnosis not present

## 2017-05-26 DIAGNOSIS — J9691 Respiratory failure, unspecified with hypoxia: Secondary | ICD-10-CM | POA: Diagnosis not present

## 2017-05-26 DIAGNOSIS — Z955 Presence of coronary angioplasty implant and graft: Secondary | ICD-10-CM | POA: Diagnosis not present

## 2017-05-26 DIAGNOSIS — R918 Other nonspecific abnormal finding of lung field: Secondary | ICD-10-CM | POA: Diagnosis not present

## 2017-05-26 DIAGNOSIS — I2510 Atherosclerotic heart disease of native coronary artery without angina pectoris: Secondary | ICD-10-CM | POA: Diagnosis not present

## 2017-05-31 DIAGNOSIS — J9691 Respiratory failure, unspecified with hypoxia: Secondary | ICD-10-CM | POA: Diagnosis not present

## 2017-05-31 DIAGNOSIS — I10 Essential (primary) hypertension: Secondary | ICD-10-CM | POA: Diagnosis not present

## 2017-05-31 DIAGNOSIS — S22060S Wedge compression fracture of T7-T8 vertebra, sequela: Secondary | ICD-10-CM | POA: Diagnosis not present

## 2017-05-31 DIAGNOSIS — I2510 Atherosclerotic heart disease of native coronary artery without angina pectoris: Secondary | ICD-10-CM | POA: Diagnosis not present

## 2017-05-31 DIAGNOSIS — Z955 Presence of coronary angioplasty implant and graft: Secondary | ICD-10-CM | POA: Diagnosis not present

## 2017-05-31 DIAGNOSIS — R918 Other nonspecific abnormal finding of lung field: Secondary | ICD-10-CM | POA: Diagnosis not present

## 2017-06-02 DIAGNOSIS — S22060S Wedge compression fracture of T7-T8 vertebra, sequela: Secondary | ICD-10-CM | POA: Diagnosis not present

## 2017-06-02 DIAGNOSIS — I2510 Atherosclerotic heart disease of native coronary artery without angina pectoris: Secondary | ICD-10-CM | POA: Diagnosis not present

## 2017-06-02 DIAGNOSIS — R918 Other nonspecific abnormal finding of lung field: Secondary | ICD-10-CM | POA: Diagnosis not present

## 2017-06-02 DIAGNOSIS — I10 Essential (primary) hypertension: Secondary | ICD-10-CM | POA: Diagnosis not present

## 2017-06-02 DIAGNOSIS — Z955 Presence of coronary angioplasty implant and graft: Secondary | ICD-10-CM | POA: Diagnosis not present

## 2017-06-02 DIAGNOSIS — J9691 Respiratory failure, unspecified with hypoxia: Secondary | ICD-10-CM | POA: Diagnosis not present

## 2017-06-06 DIAGNOSIS — J9691 Respiratory failure, unspecified with hypoxia: Secondary | ICD-10-CM | POA: Diagnosis not present

## 2017-06-06 DIAGNOSIS — R918 Other nonspecific abnormal finding of lung field: Secondary | ICD-10-CM | POA: Diagnosis not present

## 2017-06-06 DIAGNOSIS — S22060S Wedge compression fracture of T7-T8 vertebra, sequela: Secondary | ICD-10-CM | POA: Diagnosis not present

## 2017-06-06 DIAGNOSIS — I2510 Atherosclerotic heart disease of native coronary artery without angina pectoris: Secondary | ICD-10-CM | POA: Diagnosis not present

## 2017-06-06 DIAGNOSIS — I10 Essential (primary) hypertension: Secondary | ICD-10-CM | POA: Diagnosis not present

## 2017-06-06 DIAGNOSIS — Z955 Presence of coronary angioplasty implant and graft: Secondary | ICD-10-CM | POA: Diagnosis not present

## 2017-06-07 DIAGNOSIS — Z955 Presence of coronary angioplasty implant and graft: Secondary | ICD-10-CM | POA: Diagnosis not present

## 2017-06-07 DIAGNOSIS — I2510 Atherosclerotic heart disease of native coronary artery without angina pectoris: Secondary | ICD-10-CM | POA: Diagnosis not present

## 2017-06-07 DIAGNOSIS — S22060S Wedge compression fracture of T7-T8 vertebra, sequela: Secondary | ICD-10-CM | POA: Diagnosis not present

## 2017-06-07 DIAGNOSIS — I10 Essential (primary) hypertension: Secondary | ICD-10-CM | POA: Diagnosis not present

## 2017-06-07 DIAGNOSIS — R918 Other nonspecific abnormal finding of lung field: Secondary | ICD-10-CM | POA: Diagnosis not present

## 2017-06-07 DIAGNOSIS — J9691 Respiratory failure, unspecified with hypoxia: Secondary | ICD-10-CM | POA: Diagnosis not present

## 2017-06-08 DIAGNOSIS — I10 Essential (primary) hypertension: Secondary | ICD-10-CM | POA: Diagnosis not present

## 2017-06-08 DIAGNOSIS — R918 Other nonspecific abnormal finding of lung field: Secondary | ICD-10-CM | POA: Diagnosis not present

## 2017-06-08 DIAGNOSIS — I2510 Atherosclerotic heart disease of native coronary artery without angina pectoris: Secondary | ICD-10-CM | POA: Diagnosis not present

## 2017-06-08 DIAGNOSIS — S22060S Wedge compression fracture of T7-T8 vertebra, sequela: Secondary | ICD-10-CM | POA: Diagnosis not present

## 2017-06-08 DIAGNOSIS — J9691 Respiratory failure, unspecified with hypoxia: Secondary | ICD-10-CM | POA: Diagnosis not present

## 2017-06-08 DIAGNOSIS — Z955 Presence of coronary angioplasty implant and graft: Secondary | ICD-10-CM | POA: Diagnosis not present

## 2017-06-09 DIAGNOSIS — J9691 Respiratory failure, unspecified with hypoxia: Secondary | ICD-10-CM | POA: Diagnosis not present

## 2017-06-09 DIAGNOSIS — I10 Essential (primary) hypertension: Secondary | ICD-10-CM | POA: Diagnosis not present

## 2017-06-09 DIAGNOSIS — S22060S Wedge compression fracture of T7-T8 vertebra, sequela: Secondary | ICD-10-CM | POA: Diagnosis not present

## 2017-06-09 DIAGNOSIS — I2510 Atherosclerotic heart disease of native coronary artery without angina pectoris: Secondary | ICD-10-CM | POA: Diagnosis not present

## 2017-06-09 DIAGNOSIS — R918 Other nonspecific abnormal finding of lung field: Secondary | ICD-10-CM | POA: Diagnosis not present

## 2017-06-09 DIAGNOSIS — Z955 Presence of coronary angioplasty implant and graft: Secondary | ICD-10-CM | POA: Diagnosis not present

## 2017-06-13 DIAGNOSIS — I2510 Atherosclerotic heart disease of native coronary artery without angina pectoris: Secondary | ICD-10-CM | POA: Diagnosis not present

## 2017-06-13 DIAGNOSIS — I10 Essential (primary) hypertension: Secondary | ICD-10-CM | POA: Diagnosis not present

## 2017-06-13 DIAGNOSIS — J9691 Respiratory failure, unspecified with hypoxia: Secondary | ICD-10-CM | POA: Diagnosis not present

## 2017-06-13 DIAGNOSIS — S22060S Wedge compression fracture of T7-T8 vertebra, sequela: Secondary | ICD-10-CM | POA: Diagnosis not present

## 2017-06-13 DIAGNOSIS — R918 Other nonspecific abnormal finding of lung field: Secondary | ICD-10-CM | POA: Diagnosis not present

## 2017-06-13 DIAGNOSIS — M159 Polyosteoarthritis, unspecified: Secondary | ICD-10-CM | POA: Diagnosis not present

## 2017-06-13 DIAGNOSIS — Z955 Presence of coronary angioplasty implant and graft: Secondary | ICD-10-CM | POA: Diagnosis not present

## 2017-06-13 DIAGNOSIS — H409 Unspecified glaucoma: Secondary | ICD-10-CM | POA: Diagnosis not present

## 2017-06-16 DIAGNOSIS — S22060S Wedge compression fracture of T7-T8 vertebra, sequela: Secondary | ICD-10-CM | POA: Diagnosis not present

## 2017-06-16 DIAGNOSIS — J9691 Respiratory failure, unspecified with hypoxia: Secondary | ICD-10-CM | POA: Diagnosis not present

## 2017-06-16 DIAGNOSIS — I10 Essential (primary) hypertension: Secondary | ICD-10-CM | POA: Diagnosis not present

## 2017-06-16 DIAGNOSIS — R918 Other nonspecific abnormal finding of lung field: Secondary | ICD-10-CM | POA: Diagnosis not present

## 2017-06-16 DIAGNOSIS — I2510 Atherosclerotic heart disease of native coronary artery without angina pectoris: Secondary | ICD-10-CM | POA: Diagnosis not present

## 2017-06-16 DIAGNOSIS — Z955 Presence of coronary angioplasty implant and graft: Secondary | ICD-10-CM | POA: Diagnosis not present

## 2017-06-22 DIAGNOSIS — I10 Essential (primary) hypertension: Secondary | ICD-10-CM | POA: Diagnosis not present

## 2017-06-22 DIAGNOSIS — I2510 Atherosclerotic heart disease of native coronary artery without angina pectoris: Secondary | ICD-10-CM | POA: Diagnosis not present

## 2017-06-22 DIAGNOSIS — J9691 Respiratory failure, unspecified with hypoxia: Secondary | ICD-10-CM | POA: Diagnosis not present

## 2017-06-22 DIAGNOSIS — S22060S Wedge compression fracture of T7-T8 vertebra, sequela: Secondary | ICD-10-CM | POA: Diagnosis not present

## 2017-06-22 DIAGNOSIS — Z955 Presence of coronary angioplasty implant and graft: Secondary | ICD-10-CM | POA: Diagnosis not present

## 2017-06-22 DIAGNOSIS — R918 Other nonspecific abnormal finding of lung field: Secondary | ICD-10-CM | POA: Diagnosis not present

## 2017-06-23 DIAGNOSIS — I2510 Atherosclerotic heart disease of native coronary artery without angina pectoris: Secondary | ICD-10-CM | POA: Diagnosis not present

## 2017-06-23 DIAGNOSIS — R918 Other nonspecific abnormal finding of lung field: Secondary | ICD-10-CM | POA: Diagnosis not present

## 2017-06-23 DIAGNOSIS — Z955 Presence of coronary angioplasty implant and graft: Secondary | ICD-10-CM | POA: Diagnosis not present

## 2017-06-23 DIAGNOSIS — S22060S Wedge compression fracture of T7-T8 vertebra, sequela: Secondary | ICD-10-CM | POA: Diagnosis not present

## 2017-06-23 DIAGNOSIS — J9691 Respiratory failure, unspecified with hypoxia: Secondary | ICD-10-CM | POA: Diagnosis not present

## 2017-06-23 DIAGNOSIS — I10 Essential (primary) hypertension: Secondary | ICD-10-CM | POA: Diagnosis not present

## 2017-06-24 DIAGNOSIS — J9691 Respiratory failure, unspecified with hypoxia: Secondary | ICD-10-CM | POA: Diagnosis not present

## 2017-06-24 DIAGNOSIS — I10 Essential (primary) hypertension: Secondary | ICD-10-CM | POA: Diagnosis not present

## 2017-06-24 DIAGNOSIS — Z955 Presence of coronary angioplasty implant and graft: Secondary | ICD-10-CM | POA: Diagnosis not present

## 2017-06-24 DIAGNOSIS — S22060S Wedge compression fracture of T7-T8 vertebra, sequela: Secondary | ICD-10-CM | POA: Diagnosis not present

## 2017-06-24 DIAGNOSIS — R918 Other nonspecific abnormal finding of lung field: Secondary | ICD-10-CM | POA: Diagnosis not present

## 2017-06-24 DIAGNOSIS — I2510 Atherosclerotic heart disease of native coronary artery without angina pectoris: Secondary | ICD-10-CM | POA: Diagnosis not present

## 2017-06-28 DIAGNOSIS — R918 Other nonspecific abnormal finding of lung field: Secondary | ICD-10-CM | POA: Diagnosis not present

## 2017-06-28 DIAGNOSIS — I10 Essential (primary) hypertension: Secondary | ICD-10-CM | POA: Diagnosis not present

## 2017-06-28 DIAGNOSIS — Z955 Presence of coronary angioplasty implant and graft: Secondary | ICD-10-CM | POA: Diagnosis not present

## 2017-06-28 DIAGNOSIS — S22060S Wedge compression fracture of T7-T8 vertebra, sequela: Secondary | ICD-10-CM | POA: Diagnosis not present

## 2017-06-28 DIAGNOSIS — I2510 Atherosclerotic heart disease of native coronary artery without angina pectoris: Secondary | ICD-10-CM | POA: Diagnosis not present

## 2017-06-28 DIAGNOSIS — J9691 Respiratory failure, unspecified with hypoxia: Secondary | ICD-10-CM | POA: Diagnosis not present

## 2017-06-30 DIAGNOSIS — I10 Essential (primary) hypertension: Secondary | ICD-10-CM | POA: Diagnosis not present

## 2017-06-30 DIAGNOSIS — J9691 Respiratory failure, unspecified with hypoxia: Secondary | ICD-10-CM | POA: Diagnosis not present

## 2017-06-30 DIAGNOSIS — S22060S Wedge compression fracture of T7-T8 vertebra, sequela: Secondary | ICD-10-CM | POA: Diagnosis not present

## 2017-06-30 DIAGNOSIS — Z955 Presence of coronary angioplasty implant and graft: Secondary | ICD-10-CM | POA: Diagnosis not present

## 2017-06-30 DIAGNOSIS — I2510 Atherosclerotic heart disease of native coronary artery without angina pectoris: Secondary | ICD-10-CM | POA: Diagnosis not present

## 2017-06-30 DIAGNOSIS — R918 Other nonspecific abnormal finding of lung field: Secondary | ICD-10-CM | POA: Diagnosis not present

## 2017-07-05 DIAGNOSIS — S22060S Wedge compression fracture of T7-T8 vertebra, sequela: Secondary | ICD-10-CM | POA: Diagnosis not present

## 2017-07-05 DIAGNOSIS — R918 Other nonspecific abnormal finding of lung field: Secondary | ICD-10-CM | POA: Diagnosis not present

## 2017-07-05 DIAGNOSIS — I10 Essential (primary) hypertension: Secondary | ICD-10-CM | POA: Diagnosis not present

## 2017-07-05 DIAGNOSIS — Z955 Presence of coronary angioplasty implant and graft: Secondary | ICD-10-CM | POA: Diagnosis not present

## 2017-07-05 DIAGNOSIS — J9691 Respiratory failure, unspecified with hypoxia: Secondary | ICD-10-CM | POA: Diagnosis not present

## 2017-07-05 DIAGNOSIS — I2510 Atherosclerotic heart disease of native coronary artery without angina pectoris: Secondary | ICD-10-CM | POA: Diagnosis not present

## 2017-07-07 DIAGNOSIS — I2510 Atherosclerotic heart disease of native coronary artery without angina pectoris: Secondary | ICD-10-CM | POA: Diagnosis not present

## 2017-07-07 DIAGNOSIS — Z955 Presence of coronary angioplasty implant and graft: Secondary | ICD-10-CM | POA: Diagnosis not present

## 2017-07-07 DIAGNOSIS — R918 Other nonspecific abnormal finding of lung field: Secondary | ICD-10-CM | POA: Diagnosis not present

## 2017-07-07 DIAGNOSIS — J9691 Respiratory failure, unspecified with hypoxia: Secondary | ICD-10-CM | POA: Diagnosis not present

## 2017-07-07 DIAGNOSIS — I10 Essential (primary) hypertension: Secondary | ICD-10-CM | POA: Diagnosis not present

## 2017-07-07 DIAGNOSIS — S22060S Wedge compression fracture of T7-T8 vertebra, sequela: Secondary | ICD-10-CM | POA: Diagnosis not present

## 2017-07-13 DIAGNOSIS — R918 Other nonspecific abnormal finding of lung field: Secondary | ICD-10-CM | POA: Diagnosis not present

## 2017-07-13 DIAGNOSIS — Z955 Presence of coronary angioplasty implant and graft: Secondary | ICD-10-CM | POA: Diagnosis not present

## 2017-07-13 DIAGNOSIS — I10 Essential (primary) hypertension: Secondary | ICD-10-CM | POA: Diagnosis not present

## 2017-07-13 DIAGNOSIS — J9691 Respiratory failure, unspecified with hypoxia: Secondary | ICD-10-CM | POA: Diagnosis not present

## 2017-07-13 DIAGNOSIS — H409 Unspecified glaucoma: Secondary | ICD-10-CM | POA: Diagnosis not present

## 2017-07-13 DIAGNOSIS — S22060S Wedge compression fracture of T7-T8 vertebra, sequela: Secondary | ICD-10-CM | POA: Diagnosis not present

## 2017-07-13 DIAGNOSIS — M159 Polyosteoarthritis, unspecified: Secondary | ICD-10-CM | POA: Diagnosis not present

## 2017-07-13 DIAGNOSIS — I2510 Atherosclerotic heart disease of native coronary artery without angina pectoris: Secondary | ICD-10-CM | POA: Diagnosis not present

## 2017-07-14 DIAGNOSIS — I10 Essential (primary) hypertension: Secondary | ICD-10-CM | POA: Diagnosis not present

## 2017-07-14 DIAGNOSIS — Z955 Presence of coronary angioplasty implant and graft: Secondary | ICD-10-CM | POA: Diagnosis not present

## 2017-07-14 DIAGNOSIS — J9691 Respiratory failure, unspecified with hypoxia: Secondary | ICD-10-CM | POA: Diagnosis not present

## 2017-07-14 DIAGNOSIS — S22060S Wedge compression fracture of T7-T8 vertebra, sequela: Secondary | ICD-10-CM | POA: Diagnosis not present

## 2017-07-14 DIAGNOSIS — I2510 Atherosclerotic heart disease of native coronary artery without angina pectoris: Secondary | ICD-10-CM | POA: Diagnosis not present

## 2017-07-14 DIAGNOSIS — R918 Other nonspecific abnormal finding of lung field: Secondary | ICD-10-CM | POA: Diagnosis not present

## 2017-07-19 DIAGNOSIS — I10 Essential (primary) hypertension: Secondary | ICD-10-CM | POA: Diagnosis not present

## 2017-07-19 DIAGNOSIS — S22060S Wedge compression fracture of T7-T8 vertebra, sequela: Secondary | ICD-10-CM | POA: Diagnosis not present

## 2017-07-19 DIAGNOSIS — R918 Other nonspecific abnormal finding of lung field: Secondary | ICD-10-CM | POA: Diagnosis not present

## 2017-07-19 DIAGNOSIS — Z955 Presence of coronary angioplasty implant and graft: Secondary | ICD-10-CM | POA: Diagnosis not present

## 2017-07-19 DIAGNOSIS — J9691 Respiratory failure, unspecified with hypoxia: Secondary | ICD-10-CM | POA: Diagnosis not present

## 2017-07-19 DIAGNOSIS — I2510 Atherosclerotic heart disease of native coronary artery without angina pectoris: Secondary | ICD-10-CM | POA: Diagnosis not present

## 2017-07-20 DIAGNOSIS — J9691 Respiratory failure, unspecified with hypoxia: Secondary | ICD-10-CM | POA: Diagnosis not present

## 2017-07-20 DIAGNOSIS — Z955 Presence of coronary angioplasty implant and graft: Secondary | ICD-10-CM | POA: Diagnosis not present

## 2017-07-20 DIAGNOSIS — I10 Essential (primary) hypertension: Secondary | ICD-10-CM | POA: Diagnosis not present

## 2017-07-20 DIAGNOSIS — R918 Other nonspecific abnormal finding of lung field: Secondary | ICD-10-CM | POA: Diagnosis not present

## 2017-07-20 DIAGNOSIS — S22060S Wedge compression fracture of T7-T8 vertebra, sequela: Secondary | ICD-10-CM | POA: Diagnosis not present

## 2017-07-20 DIAGNOSIS — I2510 Atherosclerotic heart disease of native coronary artery without angina pectoris: Secondary | ICD-10-CM | POA: Diagnosis not present

## 2017-07-21 DIAGNOSIS — S51819A Laceration without foreign body of unspecified forearm, initial encounter: Secondary | ICD-10-CM | POA: Diagnosis not present

## 2017-07-21 DIAGNOSIS — J9691 Respiratory failure, unspecified with hypoxia: Secondary | ICD-10-CM | POA: Diagnosis not present

## 2017-07-21 DIAGNOSIS — R350 Frequency of micturition: Secondary | ICD-10-CM | POA: Diagnosis not present

## 2017-07-21 DIAGNOSIS — Z955 Presence of coronary angioplasty implant and graft: Secondary | ICD-10-CM | POA: Diagnosis not present

## 2017-07-21 DIAGNOSIS — S22060S Wedge compression fracture of T7-T8 vertebra, sequela: Secondary | ICD-10-CM | POA: Diagnosis not present

## 2017-07-21 DIAGNOSIS — R918 Other nonspecific abnormal finding of lung field: Secondary | ICD-10-CM | POA: Diagnosis not present

## 2017-07-21 DIAGNOSIS — I2510 Atherosclerotic heart disease of native coronary artery without angina pectoris: Secondary | ICD-10-CM | POA: Diagnosis not present

## 2017-07-21 DIAGNOSIS — N390 Urinary tract infection, site not specified: Secondary | ICD-10-CM | POA: Diagnosis not present

## 2017-07-21 DIAGNOSIS — I10 Essential (primary) hypertension: Secondary | ICD-10-CM | POA: Diagnosis not present

## 2017-07-22 DIAGNOSIS — R918 Other nonspecific abnormal finding of lung field: Secondary | ICD-10-CM | POA: Diagnosis not present

## 2017-07-22 DIAGNOSIS — S22060S Wedge compression fracture of T7-T8 vertebra, sequela: Secondary | ICD-10-CM | POA: Diagnosis not present

## 2017-07-22 DIAGNOSIS — I10 Essential (primary) hypertension: Secondary | ICD-10-CM | POA: Diagnosis not present

## 2017-07-22 DIAGNOSIS — I2510 Atherosclerotic heart disease of native coronary artery without angina pectoris: Secondary | ICD-10-CM | POA: Diagnosis not present

## 2017-07-22 DIAGNOSIS — J9691 Respiratory failure, unspecified with hypoxia: Secondary | ICD-10-CM | POA: Diagnosis not present

## 2017-07-22 DIAGNOSIS — Z955 Presence of coronary angioplasty implant and graft: Secondary | ICD-10-CM | POA: Diagnosis not present

## 2017-07-23 DIAGNOSIS — I2510 Atherosclerotic heart disease of native coronary artery without angina pectoris: Secondary | ICD-10-CM | POA: Diagnosis not present

## 2017-07-23 DIAGNOSIS — S22060S Wedge compression fracture of T7-T8 vertebra, sequela: Secondary | ICD-10-CM | POA: Diagnosis not present

## 2017-07-23 DIAGNOSIS — R918 Other nonspecific abnormal finding of lung field: Secondary | ICD-10-CM | POA: Diagnosis not present

## 2017-07-23 DIAGNOSIS — J9691 Respiratory failure, unspecified with hypoxia: Secondary | ICD-10-CM | POA: Diagnosis not present

## 2017-07-23 DIAGNOSIS — Z955 Presence of coronary angioplasty implant and graft: Secondary | ICD-10-CM | POA: Diagnosis not present

## 2017-07-23 DIAGNOSIS — I10 Essential (primary) hypertension: Secondary | ICD-10-CM | POA: Diagnosis not present

## 2017-07-26 DIAGNOSIS — Z955 Presence of coronary angioplasty implant and graft: Secondary | ICD-10-CM | POA: Diagnosis not present

## 2017-07-26 DIAGNOSIS — J9691 Respiratory failure, unspecified with hypoxia: Secondary | ICD-10-CM | POA: Diagnosis not present

## 2017-07-26 DIAGNOSIS — R918 Other nonspecific abnormal finding of lung field: Secondary | ICD-10-CM | POA: Diagnosis not present

## 2017-07-26 DIAGNOSIS — I2510 Atherosclerotic heart disease of native coronary artery without angina pectoris: Secondary | ICD-10-CM | POA: Diagnosis not present

## 2017-07-26 DIAGNOSIS — S22060S Wedge compression fracture of T7-T8 vertebra, sequela: Secondary | ICD-10-CM | POA: Diagnosis not present

## 2017-07-26 DIAGNOSIS — I10 Essential (primary) hypertension: Secondary | ICD-10-CM | POA: Diagnosis not present

## 2017-07-27 DIAGNOSIS — H401133 Primary open-angle glaucoma, bilateral, severe stage: Secondary | ICD-10-CM | POA: Diagnosis not present

## 2017-07-28 DIAGNOSIS — J9691 Respiratory failure, unspecified with hypoxia: Secondary | ICD-10-CM | POA: Diagnosis not present

## 2017-07-28 DIAGNOSIS — R918 Other nonspecific abnormal finding of lung field: Secondary | ICD-10-CM | POA: Diagnosis not present

## 2017-07-28 DIAGNOSIS — S22060S Wedge compression fracture of T7-T8 vertebra, sequela: Secondary | ICD-10-CM | POA: Diagnosis not present

## 2017-07-28 DIAGNOSIS — Z955 Presence of coronary angioplasty implant and graft: Secondary | ICD-10-CM | POA: Diagnosis not present

## 2017-07-28 DIAGNOSIS — I2510 Atherosclerotic heart disease of native coronary artery without angina pectoris: Secondary | ICD-10-CM | POA: Diagnosis not present

## 2017-07-28 DIAGNOSIS — I10 Essential (primary) hypertension: Secondary | ICD-10-CM | POA: Diagnosis not present

## 2017-08-02 DIAGNOSIS — R918 Other nonspecific abnormal finding of lung field: Secondary | ICD-10-CM | POA: Diagnosis not present

## 2017-08-02 DIAGNOSIS — Z955 Presence of coronary angioplasty implant and graft: Secondary | ICD-10-CM | POA: Diagnosis not present

## 2017-08-02 DIAGNOSIS — J9691 Respiratory failure, unspecified with hypoxia: Secondary | ICD-10-CM | POA: Diagnosis not present

## 2017-08-02 DIAGNOSIS — S22060S Wedge compression fracture of T7-T8 vertebra, sequela: Secondary | ICD-10-CM | POA: Diagnosis not present

## 2017-08-02 DIAGNOSIS — I2510 Atherosclerotic heart disease of native coronary artery without angina pectoris: Secondary | ICD-10-CM | POA: Diagnosis not present

## 2017-08-02 DIAGNOSIS — I10 Essential (primary) hypertension: Secondary | ICD-10-CM | POA: Diagnosis not present

## 2017-08-04 DIAGNOSIS — R918 Other nonspecific abnormal finding of lung field: Secondary | ICD-10-CM | POA: Diagnosis not present

## 2017-08-04 DIAGNOSIS — S22060S Wedge compression fracture of T7-T8 vertebra, sequela: Secondary | ICD-10-CM | POA: Diagnosis not present

## 2017-08-04 DIAGNOSIS — J9691 Respiratory failure, unspecified with hypoxia: Secondary | ICD-10-CM | POA: Diagnosis not present

## 2017-08-04 DIAGNOSIS — Z955 Presence of coronary angioplasty implant and graft: Secondary | ICD-10-CM | POA: Diagnosis not present

## 2017-08-04 DIAGNOSIS — I2510 Atherosclerotic heart disease of native coronary artery without angina pectoris: Secondary | ICD-10-CM | POA: Diagnosis not present

## 2017-08-04 DIAGNOSIS — I10 Essential (primary) hypertension: Secondary | ICD-10-CM | POA: Diagnosis not present

## 2017-08-07 ENCOUNTER — Emergency Department (HOSPITAL_COMMUNITY)

## 2017-08-07 ENCOUNTER — Inpatient Hospital Stay (HOSPITAL_COMMUNITY)
Admission: EM | Admit: 2017-08-07 | Discharge: 2017-08-10 | DRG: 070 | Disposition: A | Discharge status: Hospice | Attending: Internal Medicine | Admitting: Internal Medicine

## 2017-08-07 ENCOUNTER — Encounter (HOSPITAL_COMMUNITY): Payer: Self-pay

## 2017-08-07 ENCOUNTER — Other Ambulatory Visit: Payer: Self-pay

## 2017-08-07 DIAGNOSIS — Z79899 Other long term (current) drug therapy: Secondary | ICD-10-CM

## 2017-08-07 DIAGNOSIS — J9621 Acute and chronic respiratory failure with hypoxia: Secondary | ICD-10-CM | POA: Diagnosis present

## 2017-08-07 DIAGNOSIS — Z7951 Long term (current) use of inhaled steroids: Secondary | ICD-10-CM

## 2017-08-07 DIAGNOSIS — Z96611 Presence of right artificial shoulder joint: Secondary | ICD-10-CM | POA: Diagnosis present

## 2017-08-07 DIAGNOSIS — G4733 Obstructive sleep apnea (adult) (pediatric): Secondary | ICD-10-CM | POA: Diagnosis present

## 2017-08-07 DIAGNOSIS — Z8249 Family history of ischemic heart disease and other diseases of the circulatory system: Secondary | ICD-10-CM

## 2017-08-07 DIAGNOSIS — I25119 Atherosclerotic heart disease of native coronary artery with unspecified angina pectoris: Secondary | ICD-10-CM | POA: Diagnosis present

## 2017-08-07 DIAGNOSIS — R4182 Altered mental status, unspecified: Secondary | ICD-10-CM | POA: Diagnosis present

## 2017-08-07 DIAGNOSIS — Z90710 Acquired absence of both cervix and uterus: Secondary | ICD-10-CM

## 2017-08-07 DIAGNOSIS — Z7401 Bed confinement status: Secondary | ICD-10-CM | POA: Diagnosis not present

## 2017-08-07 DIAGNOSIS — F418 Other specified anxiety disorders: Secondary | ICD-10-CM | POA: Diagnosis present

## 2017-08-07 DIAGNOSIS — R451 Restlessness and agitation: Secondary | ICD-10-CM

## 2017-08-07 DIAGNOSIS — J9622 Acute and chronic respiratory failure with hypercapnia: Secondary | ICD-10-CM | POA: Diagnosis not present

## 2017-08-07 DIAGNOSIS — J449 Chronic obstructive pulmonary disease, unspecified: Secondary | ICD-10-CM | POA: Diagnosis present

## 2017-08-07 DIAGNOSIS — K219 Gastro-esophageal reflux disease without esophagitis: Secondary | ICD-10-CM | POA: Diagnosis present

## 2017-08-07 DIAGNOSIS — Z955 Presence of coronary angioplasty implant and graft: Secondary | ICD-10-CM | POA: Diagnosis not present

## 2017-08-07 DIAGNOSIS — G894 Chronic pain syndrome: Secondary | ICD-10-CM | POA: Diagnosis present

## 2017-08-07 DIAGNOSIS — Z9119 Patient's noncompliance with other medical treatment and regimen: Secondary | ICD-10-CM

## 2017-08-07 DIAGNOSIS — Z79891 Long term (current) use of opiate analgesic: Secondary | ICD-10-CM

## 2017-08-07 DIAGNOSIS — R627 Adult failure to thrive: Secondary | ICD-10-CM | POA: Diagnosis present

## 2017-08-07 DIAGNOSIS — I252 Old myocardial infarction: Secondary | ICD-10-CM

## 2017-08-07 DIAGNOSIS — R400 Somnolence: Secondary | ICD-10-CM | POA: Diagnosis not present

## 2017-08-07 DIAGNOSIS — H409 Unspecified glaucoma: Secondary | ICD-10-CM | POA: Diagnosis present

## 2017-08-07 DIAGNOSIS — M2550 Pain in unspecified joint: Secondary | ICD-10-CM | POA: Diagnosis not present

## 2017-08-07 DIAGNOSIS — I2510 Atherosclerotic heart disease of native coronary artery without angina pectoris: Secondary | ICD-10-CM | POA: Diagnosis not present

## 2017-08-07 DIAGNOSIS — G9341 Metabolic encephalopathy: Principal | ICD-10-CM | POA: Diagnosis present

## 2017-08-07 DIAGNOSIS — E785 Hyperlipidemia, unspecified: Secondary | ICD-10-CM | POA: Diagnosis present

## 2017-08-07 DIAGNOSIS — Z515 Encounter for palliative care: Secondary | ICD-10-CM

## 2017-08-07 DIAGNOSIS — T424X5A Adverse effect of benzodiazepines, initial encounter: Secondary | ICD-10-CM | POA: Diagnosis present

## 2017-08-07 DIAGNOSIS — J9691 Respiratory failure, unspecified with hypoxia: Secondary | ICD-10-CM | POA: Diagnosis not present

## 2017-08-07 DIAGNOSIS — Z7902 Long term (current) use of antithrombotics/antiplatelets: Secondary | ICD-10-CM | POA: Diagnosis not present

## 2017-08-07 DIAGNOSIS — Z87891 Personal history of nicotine dependence: Secondary | ICD-10-CM

## 2017-08-07 DIAGNOSIS — T40695A Adverse effect of other narcotics, initial encounter: Secondary | ICD-10-CM | POA: Diagnosis present

## 2017-08-07 DIAGNOSIS — S22060S Wedge compression fracture of T7-T8 vertebra, sequela: Secondary | ICD-10-CM | POA: Diagnosis not present

## 2017-08-07 DIAGNOSIS — I10 Essential (primary) hypertension: Secondary | ICD-10-CM | POA: Diagnosis present

## 2017-08-07 DIAGNOSIS — Z7189 Other specified counseling: Secondary | ICD-10-CM

## 2017-08-07 DIAGNOSIS — Z9981 Dependence on supplemental oxygen: Secondary | ICD-10-CM | POA: Diagnosis not present

## 2017-08-07 DIAGNOSIS — F0390 Unspecified dementia without behavioral disturbance: Secondary | ICD-10-CM | POA: Diagnosis present

## 2017-08-07 DIAGNOSIS — R918 Other nonspecific abnormal finding of lung field: Secondary | ICD-10-CM | POA: Diagnosis not present

## 2017-08-07 DIAGNOSIS — R410 Disorientation, unspecified: Secondary | ICD-10-CM

## 2017-08-07 LAB — URINALYSIS, ROUTINE W REFLEX MICROSCOPIC
Bilirubin Urine: NEGATIVE
Glucose, UA: NEGATIVE mg/dL
Hgb urine dipstick: NEGATIVE
Ketones, ur: NEGATIVE mg/dL
LEUKOCYTES UA: NEGATIVE
Nitrite: NEGATIVE
PROTEIN: NEGATIVE mg/dL
Specific Gravity, Urine: 1.018 (ref 1.005–1.030)
pH: 6 (ref 5.0–8.0)

## 2017-08-07 LAB — I-STAT ARTERIAL BLOOD GAS, ED
ACID-BASE EXCESS: 8 mmol/L — AB (ref 0.0–2.0)
Acid-Base Excess: 8 mmol/L — ABNORMAL HIGH (ref 0.0–2.0)
BICARBONATE: 34.4 mmol/L — AB (ref 20.0–28.0)
Bicarbonate: 36.4 mmol/L — ABNORMAL HIGH (ref 20.0–28.0)
O2 Saturation: 96 %
O2 Saturation: 95 %
PH ART: 7.335 — AB (ref 7.350–7.450)
PO2 ART: 95 mmHg (ref 83.0–108.0)
PO2 ART: 74 mmHg — AB (ref 83.0–108.0)
Patient temperature: 98.7
TCO2: 38 mmol/L — ABNORMAL HIGH (ref 22–32)
TCO2: 36 mmol/L — ABNORMAL HIGH (ref 22–32)
pCO2 arterial: 68.3 mmHg (ref 32.0–48.0)
pCO2 arterial: 53.7 mmHg — ABNORMAL HIGH (ref 32.0–48.0)
pH, Arterial: 7.415 (ref 7.350–7.450)

## 2017-08-07 LAB — CBC
HEMATOCRIT: 41.1 % (ref 36.0–46.0)
HEMOGLOBIN: 12.2 g/dL (ref 12.0–15.0)
MCH: 28.9 pg (ref 26.0–34.0)
MCHC: 29.7 g/dL — ABNORMAL LOW (ref 30.0–36.0)
MCV: 97.4 fL (ref 78.0–100.0)
Platelets: 268 10*3/uL (ref 150–400)
RBC: 4.22 MIL/uL (ref 3.87–5.11)
RDW: 14.5 % (ref 11.5–15.5)
WBC: 7.2 10*3/uL (ref 4.0–10.5)

## 2017-08-07 LAB — I-STAT CHEM 8, ED
BUN: 10 mg/dL (ref 6–20)
CREATININE: 0.7 mg/dL (ref 0.44–1.00)
Calcium, Ion: 1.2 mmol/L (ref 1.15–1.40)
Chloride: 96 mmol/L — ABNORMAL LOW (ref 101–111)
Glucose, Bld: 121 mg/dL — ABNORMAL HIGH (ref 65–99)
HEMATOCRIT: 39 % (ref 36.0–46.0)
HEMOGLOBIN: 13.3 g/dL (ref 12.0–15.0)
POTASSIUM: 4.6 mmol/L (ref 3.5–5.1)
Sodium: 140 mmol/L (ref 135–145)
TCO2: 38 mmol/L — ABNORMAL HIGH (ref 22–32)

## 2017-08-07 LAB — COMPREHENSIVE METABOLIC PANEL
ALBUMIN: 3.5 g/dL (ref 3.5–5.0)
ALT: 14 U/L (ref 14–54)
AST: 27 U/L (ref 15–41)
Alkaline Phosphatase: 70 U/L (ref 38–126)
Anion gap: 7 (ref 5–15)
BUN: 8 mg/dL (ref 6–20)
CHLORIDE: 98 mmol/L — AB (ref 101–111)
CO2: 35 mmol/L — AB (ref 22–32)
CREATININE: 0.66 mg/dL (ref 0.44–1.00)
Calcium: 9.4 mg/dL (ref 8.9–10.3)
GFR calc non Af Amer: >60 mL/min (ref >60)
GLUCOSE: 121 mg/dL — AB (ref 65–99)
Potassium: 4.5 mmol/L (ref 3.5–5.1)
SODIUM: 140 mmol/L (ref 135–145)
Total Bilirubin: 0.3 mg/dL (ref 0.3–1.2)
Total Protein: 6.5 g/dL (ref 6.5–8.1)

## 2017-08-07 LAB — APTT: APTT: 26 s (ref 24–36)

## 2017-08-07 LAB — DIFFERENTIAL
Abs Immature Granulocytes: 0 10*3/uL (ref 0.0–0.1)
BASOS ABS: 0 10*3/uL (ref 0.0–0.1)
BASOS PCT: 0 %
Eosinophils Absolute: 0.2 10*3/uL (ref 0.0–0.7)
Eosinophils Relative: 3 %
IMMATURE GRANULOCYTES: 0 %
LYMPHS ABS: 1.5 10*3/uL (ref 0.7–4.0)
LYMPHS PCT: 21 %
MONO ABS: 0.8 10*3/uL (ref 0.1–1.0)
Monocytes Relative: 11 %
Neutro Abs: 4.7 10*3/uL (ref 1.7–7.7)
Neutrophils Relative %: 65 %

## 2017-08-07 LAB — I-STAT TROPONIN, ED: Troponin i, poc: 0.01 ng/mL (ref 0.00–0.08)

## 2017-08-07 LAB — PROTIME-INR
INR: 0.99
Prothrombin Time: 13 seconds (ref 11.4–15.2)

## 2017-08-07 LAB — CBG MONITORING, ED: Glucose-Capillary: 149 mg/dL — ABNORMAL HIGH (ref 65–99)

## 2017-08-07 MED ORDER — MORPHINE SULFATE ER 15 MG PO TBCR
7.5000 mg | EXTENDED_RELEASE_TABLET | Freq: Every day | ORAL | Status: DC
  Administered 2017-08-07: 7.5 mg via ORAL
  Filled 2017-08-07 (×2): qty 1

## 2017-08-07 MED ORDER — OLANZAPINE-FLUOXETINE HCL 3-25 MG PO CAPS
1.0000 | ORAL_CAPSULE | Freq: Every day | ORAL | Status: DC
  Administered 2017-08-08 – 2017-08-09 (×3): 1 via ORAL
  Filled 2017-08-07 (×4): qty 1

## 2017-08-07 MED ORDER — NETARSUDIL DIMESYLATE 0.02 % OP SOLN: 1.0000 [drp] | Freq: Every day | OPHTHALMIC | Status: DC

## 2017-08-07 MED ORDER — SODIUM CHLORIDE 0.9 % IV SOLN
INTRAVENOUS | Status: DC
  Administered 2017-08-08 (×2): via INTRAVENOUS

## 2017-08-07 MED ORDER — DORZOLAMIDE HCL-TIMOLOL MAL 2-0.5 % OP SOLN
1.0000 [drp] | Freq: Two times a day (BID) | OPHTHALMIC | Status: DC
  Administered 2017-08-08 – 2017-08-10 (×6): 1 [drp] via OPHTHALMIC
  Filled 2017-08-07: qty 10

## 2017-08-07 MED ORDER — ONDANSETRON HCL 4 MG PO TABS: 4.0000 mg | ORAL_TABLET | Freq: Four times a day (QID) | ORAL | Status: DC | PRN

## 2017-08-07 MED ORDER — METOPROLOL TARTRATE 25 MG PO TABS
25.0000 mg | ORAL_TABLET | Freq: Two times a day (BID) | ORAL | Status: DC
Start: 2017-08-07 — End: 2017-08-10
  Administered 2017-08-07 – 2017-08-10 (×5): 25 mg via ORAL
  Filled 2017-08-07 (×6): qty 1

## 2017-08-07 MED ORDER — POLYVINYL ALCOHOL 1.4 % OP SOLN
1.0000 [drp] | Freq: Three times a day (TID) | OPHTHALMIC | Status: DC
  Administered 2017-08-08 – 2017-08-10 (×10): 1 [drp] via OPHTHALMIC
  Filled 2017-08-07: qty 15

## 2017-08-07 MED ORDER — PREDNISOLONE ACETATE 1 % OP SUSP
1.0000 [drp] | Freq: Three times a day (TID) | OPHTHALMIC | Status: DC
  Administered 2017-08-08 – 2017-08-10 (×10): 1 [drp] via OPHTHALMIC
  Filled 2017-08-07: qty 5

## 2017-08-07 MED ORDER — LORAZEPAM 2 MG/ML IJ SOLN
0.5000 mg | Freq: Once | INTRAMUSCULAR | Status: AC
  Administered 2017-08-07: 0.5 mg via INTRAVENOUS
  Filled 2017-08-07: qty 1

## 2017-08-07 MED ORDER — PANTOPRAZOLE SODIUM 40 MG PO TBEC
40.0000 mg | DELAYED_RELEASE_TABLET | Freq: Every day | ORAL | Status: DC
  Administered 2017-08-07 – 2017-08-10 (×3): 40 mg via ORAL
  Filled 2017-08-07 (×4): qty 1

## 2017-08-07 MED ORDER — CLOPIDOGREL BISULFATE 75 MG PO TABS
75.0000 mg | ORAL_TABLET | Freq: Every day | ORAL | Status: DC
  Administered 2017-08-07 – 2017-08-09 (×3): 75 mg via ORAL
  Filled 2017-08-07 (×3): qty 1

## 2017-08-07 MED ORDER — ONDANSETRON HCL 4 MG/2ML IJ SOLN: 4.0000 mg | Freq: Four times a day (QID) | INTRAMUSCULAR | Status: DC | PRN

## 2017-08-07 MED ORDER — ENOXAPARIN SODIUM 40 MG/0.4ML ~~LOC~~ SOLN
40.0000 mg | Freq: Every day | SUBCUTANEOUS | Status: DC
  Administered 2017-08-08 – 2017-08-10 (×3): 40 mg via SUBCUTANEOUS
  Filled 2017-08-07 (×3): qty 0.4

## 2017-08-07 MED ORDER — ATORVASTATIN CALCIUM 40 MG PO TABS
40.0000 mg | ORAL_TABLET | Freq: Every day | ORAL | Status: DC
  Administered 2017-08-08 – 2017-08-09 (×3): 40 mg via ORAL
  Filled 2017-08-07 (×4): qty 1

## 2017-08-07 MED ORDER — OXYCODONE HCL 5 MG PO TABS
5.0000 mg | ORAL_TABLET | Freq: Two times a day (BID) | ORAL | Status: DC | PRN
  Administered 2017-08-08: 5 mg via ORAL
  Filled 2017-08-07: qty 1

## 2017-08-07 MED ORDER — CALCIUM CARBONATE-VITAMIN D 500-200 MG-UNIT PO TABS
1.0000 | ORAL_TABLET | Freq: Every day | ORAL | Status: DC
  Administered 2017-08-08 – 2017-08-09 (×3): 1 via ORAL
  Filled 2017-08-07 (×4): qty 1

## 2017-08-07 MED ORDER — LATANOPROST 0.005 % OP SOLN
1.0000 [drp] | Freq: Every day | OPHTHALMIC | Status: DC
  Administered 2017-08-08 – 2017-08-09 (×3): 1 [drp] via OPHTHALMIC
  Filled 2017-08-07: qty 2.5

## 2017-08-07 MED ORDER — ALBUTEROL SULFATE (2.5 MG/3ML) 0.083% IN NEBU
2.5000 mg | INHALATION_SOLUTION | Freq: Once | RESPIRATORY_TRACT | Status: AC
  Administered 2017-08-07: 2.5 mg via RESPIRATORY_TRACT
  Filled 2017-08-07: qty 3

## 2017-08-07 MED ORDER — PRAMIPEXOLE DIHYDROCHLORIDE 0.25 MG PO TABS
0.5000 mg | ORAL_TABLET | Freq: Every evening | ORAL | Status: DC | PRN
  Administered 2017-08-08 – 2017-08-09 (×4): 0.5 mg via ORAL
  Filled 2017-08-07 (×7): qty 2

## 2017-08-07 MED ORDER — DOCUSATE SODIUM 100 MG PO CAPS
200.0000 mg | ORAL_CAPSULE | Freq: Two times a day (BID) | ORAL | Status: DC
  Administered 2017-08-08 – 2017-08-09 (×4): 200 mg via ORAL
  Filled 2017-08-07 (×6): qty 2

## 2017-08-07 MED ORDER — HALOPERIDOL LACTATE 5 MG/ML IJ SOLN
2.0000 mg | Freq: Four times a day (QID) | INTRAMUSCULAR | Status: DC | PRN
  Administered 2017-08-07: 2 mg via INTRAVENOUS
  Filled 2017-08-07: qty 1

## 2017-08-07 MED ORDER — ALPRAZOLAM 0.25 MG PO TABS
0.2500 mg | ORAL_TABLET | Freq: Every evening | ORAL | Status: DC | PRN
  Administered 2017-08-08: 0.25 mg via ORAL
  Filled 2017-08-07: qty 1

## 2017-08-07 MED ORDER — FUROSEMIDE 20 MG PO TABS
10.0000 mg | ORAL_TABLET | Freq: Every day | ORAL | Status: DC
  Administered 2017-08-07 – 2017-08-10 (×3): 10 mg via ORAL
  Filled 2017-08-07 (×4): qty 1

## 2017-08-07 NOTE — Progress Notes (Signed)
ABG done. Results reported to Dr.James. No new orders at this time.

## 2017-08-07 NOTE — ED Notes (Signed)
Dr. Mikeal Hawthorne paged. Family states pt is trembling and attempting to get out of bed. Temp 97.9 Pt given warm blankets with no improvement reported by family. Pt GCS remains14. CBG 149. Pupils equal and reactive to light, pt verbal, randomly speaking of things.

## 2017-08-07 NOTE — ED Notes (Signed)
Spoke to pharmacy tech and Dr. Mikeal Hawthorne, pt family splint morphine 15mg  ER in half. Unable to waste 7.5mg  left behind by family in pyxis . Dr. Mikeal Hawthorne changed med order.

## 2017-08-07 NOTE — ED Notes (Signed)
Double order for tsh,  Will have nurse cancel 2nd order.

## 2017-08-07 NOTE — H&P (Signed)
History and Physical    Alexis Yates:[id number] DOB: 06-29-1927 DOA: 08/07/2017  Referring MD/NP/PA: Rolland Porter, MD  PCP: Marden Noble, MD   Outpatient Specialists: Dr Coralyn Helling   Patient coming from: Home  Chief Complaint: altered mental status  HPI: Alexis Yates is a 84 y.o. female with medical history significant of chronic respiratory failure due to COPD with CO2 retention, obstructive sleep apnea with patient refusing to use CPAP, history of non-ST elevation MI, hypertension who presented today with altered mental status. She has 2 daughters that live with her and noted the confusion. Patient was initially thought to have had a CVA. Head CT as well as MRI were both negative. All initial evaluations negative except increase PCO2 from her baseline level. Patient initiated on BiPAP in the ER but did not tolerate it well. She is currently sedated. She has no prior history of dementia. She appears to be delirious at the moment.  ED Course: vitals were stable except for blood pressure 179/100. Her ABG showed a pH of 7.335 PCO2 68.3 and PO2 of 95. White count is 8.9 with hemoglobin 11.7. Sodium 140 potassium 4.6 creatinine 0.69. Glucose is 121. Lactic acid is within normal. Head CT without contrast is negative and MRI of the brain also negative for anything acute.  Review of Systems: As per HPI otherwise 10 point review of systems negative.    Past Medical History:  Diagnosis Date  . Arthritis   . Atherosclerosis of aorta (HCC)   . CAD (coronary artery disease)    a. 10/27 PCI with DES to RCA, diffuse nonobstructive disease, EF 50-55%  . Chronic pain   . Diverticulosis   . Dynamic left ventricular outflow obstruction    Mod focal basal LVH. EF 60-65% w/ dynamic LVOT obstruction (Peak Gradient 138 mmHg). No RWMA. Gr 2 DD - high filling pressures. Severe SAM. ~ PAP 35 mmHg.  Marland Kitchen Elevated transaminase level   . GERD (gastroesophageal reflux disease)   . Glaucoma   . Hiatal  hernia   . Hypertension   . Lung nodules    Right  . MI (myocardial infarction) (HCC) 01/09/2016  . Scoliosis   . UTI (urinary tract infection)    refractory    Past Surgical History:  Procedure Laterality Date  . ABDOMINAL HYSTERECTOMY    . CARDIAC CATHETERIZATION N/A 01/09/2016   Procedure: Left Heart Cath and Coronary Angiography;  Surgeon: Marykay Lex, MD;  Location: [facility] INVASIVE CV LAB: mRCA 100% thrombotic lesion (PCI). o-pLAD 40%, mLAD 30%, oD1 90%. OM1 (small) 70%.  . CARDIAC CATHETERIZATION N/A 01/09/2016   Procedure: Coronary Stent Intervention;  Surgeon: Marykay Lex, MD;  Location: [facility] INVASIVE CV LAB: mRCA PCI - STENT PROMUS PREM MR 2.25X28 drug eluting stent  . EYE SURGERY    . FRACTURE SURGERY     Tibia/fibula of right leg  . TRANSTHORACIC ECHOCARDIOGRAM  01/11/2016   Mod focal basal LVH. EF 60-65% w/ dynamic LVOT obstruction (Peak Gradient 138 mmHg). No RWMA. Gr 2 DD - high filling pressures. Severe SAM. ~ PAP 35 mmHg.  Marland Kitchen TRANSTHORACIC ECHOCARDIOGRAM  07/08/2016   moderate basal hypertrophy and mild concentric hypertrophy. EF 60-65%. Dynamic outflow tract obstruction (peak gradient 17 mmHg). No aortic stenosis. GR 1 DD. Systolic anterior motion of mitral valve. No MR. Elevated PA pressures estimated at 59 mmHg.     reports that she quit smoking about 36 years ago. Her smoking use included cigarettes. She has a 33.00 pack-year smoking  history. She has never used smokeless tobacco. She reports that she does not drink alcohol or use drugs.  Allergies  Allergen Reactions  . Penicillins Anaphylaxis    Has patient had a PCN reaction causing immediate rash, facial/tongue/throat swelling, SOB or lightheadedness with hypotension: Yes Has patient had a PCN reaction causing severe rash involving mucus membranes or skin necrosis: No Has patient had a PCN reaction that required hospitalization Yes Has patient had a PCN reaction occurring within the last 10 years: No If all  of the above answers are "NO", then may proceed with Cephalosporin use.   Marland Kitchen Phenergan [Promethazine Hcl] Other (See Comments)    Restless legs    Family History  Problem Relation Age of Onset  . Cancer Brother        Stomach  . Heart disease Father        Died age 69  . Diabetes Mother   . Heart failure Mother        Died 22   Unacceptable: Noncontributory, unremarkable, or negative. Acceptable: Family history reviewed and not pertinent (If you reviewed it)  Prior to Admission medications   Medication Sig Start Date End Date Taking? Authorizing Provider  ALPRAZolam (XANAX) 0.25 MG tablet Take 0.25-0.5 mg by mouth at bedtime as needed for anxiety or sleep.    Yes [provider]  atorvastatin (LIPITOR) 40 MG tablet Take 40 mg by mouth at bedtime.  03/16/16  Yes [provider]  bimatoprost (LUMIGAN) 0.01 % SOLN Place 1 drop into both eyes at bedtime.    Yes [provider]  Calcium Carbonate-Vitamin D (CALCIUM-D PO) Take 1 tablet by mouth at bedtime.   Yes [provider]  clopidogrel (PLAVIX) 75 MG tablet Take 1 tablet (75 mg total) by mouth daily. Patient taking differently: Take 75 mg by mouth at bedtime.  01/14/16  Yes Laverda Page B, NP  docusate sodium (COLACE) 250 MG capsule Take 250 mg by mouth 2 (two) times daily.    Yes [provider]  dorzolamide-timolol (COSOPT) 22.3-6.8 MG/ML ophthalmic solution Place 1 drop into both eyes 2 (two) times daily.   Yes [provider]  esomeprazole (NEXIUM) 40 MG capsule Take 40 mg by mouth daily. 03/19/16  Yes [provider]  furosemide (LASIX) 20 MG tablet Take 0.5 tablets (10 mg total) by mouth daily. 09/28/16  Yes Narda Bonds, MD  metoprolol tartrate (LOPRESSOR) 25 MG tablet Take 25 mg by mouth 2 (two) times daily.   Yes [provider]  morphine (MS CONTIN) 15 MG 12 hr tablet Take 7.5 mg by mouth daily.   Yes [provider]  Netarsudil Dimesylate  (RHOPRESSA) 0.02 % SOLN Place 1 drop into the left eye at bedtime.   Yes [provider]  OLANZapine-FLUoxetine (SYMBYAX) 3-25 MG capsule Take 1 capsule by mouth at bedtime.    Yes [provider]  oxyCODONE (OXY IR/ROXICODONE) 5 MG immediate release tablet Take 1 tablet (5 mg total) by mouth 3 (three) times daily as needed for severe pain. Patient taking differently: Take 5 mg by mouth 2 (two) times daily as needed for breakthrough pain.  09/28/16  Yes Narda Bonds, MD  OXYGEN Inhale 2 L into the lungs See admin instructions. Use nightly while sleeping, may also uses during the day as needed for shortness of breath   Yes [provider]  Polyethyl Glycol-Propyl Glycol (SYSTANE) 0.4-0.3 % SOLN Place 1 drop into both eyes 4 (four) times daily.  Yes [provider]  pramipexole (MIRAPEX) 0.5 MG tablet Take 0.5 mg by mouth See admin instructions. Take one tablet (0.5 mg) by mouth daily at bedtime, may take another dose one hour later as needed for restless legs   Yes [provider]  prednisoLONE acetate (PRED FORTE) 1 % ophthalmic suspension Place 1 drop into the right eye 4 (four) times daily.   Yes [provider]  guaiFENesin (MUCINEX) 600 MG 12 hr tablet Take 1 tablet (600 mg total) by mouth 2 (two) times daily. Patient not taking: Reported on 08/07/2017 04/05/17   Rolly Salter, MD  ipratropium-albuterol (DUONEB) 0.5-2.5 (3) MG/3ML SOLN Take 3 mLs by nebulization every 6 (six) hours as needed. Patient not taking: Reported on 12/31/2016 09/28/16   Narda Bonds, MD  lidocaine (LIDODERM) 5 % Place 2 patches onto the skin daily. Remove & Discard patch within 12 hours or as directed by MD Patient not taking: Reported on 08/07/2017 04/05/17   Rolly Salter, MD  methocarbamol (ROBAXIN) 500 MG tablet Take 1 tablet (500 mg total) by mouth 3 (three) times daily. Patient not taking: Reported on 08/07/2017 04/05/17   Rolly Salter, MD    Physical  Exam: Vitals:   08/07/17 1724 08/07/17 1730 08/07/17 2007 08/07/17 2008  BP: 100/84 (!) 116/103 (!) 105/53   Pulse: 72 69 71   Resp: 20 (!) 21 (!) 24 (!) 24  Temp:      TempSrc:      SpO2:  94% 99%       Constitutional: NAD, calm, comfortable Vitals:   08/07/17 1724 08/07/17 1730 08/07/17 2007 08/07/17 2008  BP: 100/84 (!) 116/103 (!) 105/53   Pulse: 72 69 71   Resp: 20 (!) 21 (!) 24 (!) 24  Temp:      TempSrc:      SpO2:  94% 99%    Eyes: PERRL, lids and conjunctivae normal ENMT: Mucous membranes are moist. Posterior pharynx clear of any exudate or lesions.Normal dentition.  Neck: normal, supple, no masses, no thyromegaly Respiratory: clear to auscultation bilaterally, no wheezing, no crackles. Normal respiratory effort. No accessory muscle use.  Cardiovascular: Regular rate and rhythm, no murmurs / rubs / gallops. No extremity edema. 2+ pedal pulses. No carotid bruits.  Abdomen: no tenderness, no masses palpated. No hepatosplenomegaly. Bowel sounds positive.  Musculoskeletal: no clubbing / cyanosis. No joint deformity upper and lower extremities. Good ROM, no contractures. Normal muscle tone.  Skin: no rashes, lesions, ulcers. No induration Neurologic: CN 2-12 grossly intact. Sensation intact, DTR normal. Strength 5/5 in all 4.  Psychiatric: Normal judgment and insight. Alert and oriented x 3. Normal mood.   (Anything < 9 systems with 2 bullets each down codes to level 1) (If patient refuses exam can't bill higher level) (Make sure to document decubitus ulcers present on admission -- if possible -- and whether patient has chronic indwelling catheter at time of admission)  Labs on Admission: I have personally reviewed following labs and imaging studies  CBC: Recent Labs  Lab 08/07/17 1339 08/07/17 1434  WBC 7.2  --   NEUTROABS 4.7  --   HGB 12.2 13.3  HCT 41.1 39.0  MCV 97.4  --   PLT 268  --    Basic Metabolic Panel: Recent Labs  Lab 08/07/17 1339  08/07/17 1434  NA 140 140  K 4.5 4.6  CL 98* 96*  CO2 35*  --   GLUCOSE 121* 121*  BUN 8 10  CREATININE 0.66 0.70  CALCIUM 9.4  --    GFR: CrCl cannot be calculated (Unknown ideal weight.). Liver Function Tests: Recent Labs  Lab 08/07/17 1339  AST 27  ALT 14  ALKPHOS 70  BILITOT 0.3  PROT 6.5  ALBUMIN 3.5   No results for input(s): LIPASE, AMYLASE in the last 168 hours. No results for input(s): AMMONIA in the last 168 hours. Coagulation Profile: Recent Labs  Lab 08/07/17 1339  INR 0.99   Cardiac Enzymes: No results for input(s): CKTOTAL, CKMB, CKMBINDEX, TROPONINI in the last 168 hours. BNP (last 3 results) No results for input(s): PROBNP in the last 8760 hours. HbA1C: No results for input(s): HGBA1C in the last 72 hours. CBG: No results for input(s): GLUCAP in the last 168 hours. Lipid Profile: No results for input(s): CHOL, HDL, LDLCALC, TRIG, CHOLHDL, LDLDIRECT in the last 72 hours. Thyroid Function Tests: No results for input(s): TSH, T4TOTAL, FREET4, T3FREE, THYROIDAB in the last 72 hours. Anemia Panel: No results for input(s): VITAMINB12, FOLATE, FERRITIN, TIBC, IRON, RETICCTPCT in the last 72 hours. Urine analysis:    Component Value Date/Time   COLORURINE YELLOW 08/07/2017 1531   APPEARANCEUR HAZY (A) 08/07/2017 1531   LABSPEC 1.018 08/07/2017 1531   PHURINE 6.0 08/07/2017 1531   GLUCOSEU NEGATIVE 08/07/2017 1531   HGBUR NEGATIVE 08/07/2017 1531   BILIRUBINUR NEGATIVE 08/07/2017 1531   KETONESUR NEGATIVE 08/07/2017 1531   PROTEINUR NEGATIVE 08/07/2017 1531   UROBILINOGEN 0.2 05/27/2014 1122   NITRITE NEGATIVE 08/07/2017 1531   LEUKOCYTESUR NEGATIVE 08/07/2017 1531   Sepsis Labs: @LABRCNTIP (procalcitonin:4,lacticidven:4) )No results found for this or any previous visit (from the past 240 hour(s)).   Radiological Exams on Admission: Dg Chest 2 View  Result Date: 08/07/2017 CLINICAL DATA:  Shortness of breath. EXAM: CHEST - 2 VIEW COMPARISON:   Radiographs of April 22, 2017. FINDINGS: The heart size and mediastinal contours are within normal limits. No pneumothorax or pleural effusion is noted. Lungs are clear. Status post right shoulder arthroplasty. Severe degenerative changes seen involving the left glenohumeral joint. Thoracic kyphoscoliosis is noted. IMPRESSION: No active cardiopulmonary disease. Electronically Signed   By: Lupita Raider, M.D.   On: 08/07/2017 14:14   Ct Head Wo Contrast  Result Date: 08/07/2017 CLINICAL DATA:  Altered mental status. EXAM: CT HEAD WITHOUT CONTRAST TECHNIQUE: Contiguous axial images were obtained from the base of the skull through the vertex without intravenous contrast. COMPARISON:  None. FINDINGS: Brain: Diffusely enlarged ventricles and subarachnoid spaces. Patchy white matter low density in both cerebral hemispheres. No intracranial hemorrhage, mass lesion or CT evidence of acute infarction. Vascular: No hyperdense vessel or unexpected calcification. Skull: Normal. Negative for fracture or focal lesion. Sinuses/Orbits: Normally pneumatized paranasal sinuses. Status post bilateral cataract extraction. Other: None. IMPRESSION: 1. No acute abnormality. 2. Stable mild to moderate diffuse cerebral atrophy and minimal chronic small vessel white matter ischemic changes in both cerebral hemispheres. Electronically Signed   By: Beckie Salts M.D.   On: 08/07/2017 14:49   Mr Brain Wo Contrast  Result Date: 08/07/2017 CLINICAL DATA:  Altered level of consciousness, unexplained, possible hemianopia. EXAM: MRI HEAD WITHOUT CONTRAST TECHNIQUE: Multiplanar, multiecho pulse sequences of the brain and surrounding structures were obtained without intravenous contrast. COMPARISON:  CT head earlier today. FINDINGS: The patient was unable to remain motionless for the exam. Small or subtle lesions could be overlooked. Brain: No acute infarction, hemorrhage, hydrocephalus, extra-axial collection or mass lesion. Generalized  atrophy. Mild chronic microvascular ischemic change. Falx ossification. Vascular:  Normal flow voids. Skull and upper cervical spine: Grossly negative. Sinuses/Orbits: No sinus or mastoid disease.  Negative orbits. Other: None. IMPRESSION: Motion degraded exam. Atrophy and relatively mild changes of small vessel disease. No acute intracranial findings. Electronically Signed   By: Elsie Stain M.D.   On: 08/07/2017 18:26    EKG: Independently reviewed. Normal Sinus Rythm  Assessment/Plan Active Problems:   Altered mental state    #1 altered mental status:most likely secondary to hypercarbia. Patient may also have some baseline dementia with acute delirium. She is not tolerating the BiPAP. She appears to have severe cllaustrophobia in the past which is why she refused her CPAP. We will more than likely sedated patient. She will use BiPAP temporarily to blow out her CO2. Continue oxygenation. Review all medications. She is on multiple narcotics as well as benzodiazepines which are too much for her age. Not sure if she is withdrawing. I will check urine drug screen to make sure she's been taking it.  #2 hypertension: Blood pressure is well controlled. Continue management.  #3 obstructive sleep apnea: Patient should be on CPAP. She has refused in the outpatient setting.  #4 GERD: Continue his PPIs  #5 coronary artery disease: No evidence of decompensation. Checks serial troponins especially with altered mental status.  #6 chronic pain syndrome: Patient on multiple narcotics as described above. At her age these could be responsible for her current symptoms.   DVT prophylaxis: Lovenox  Code Status: Full  Family Communication: Daughters at bedside Disposition Plan: To be determined  Consults called: None  Admission status: inpatient  Severity of Illness: The appropriate patient status for this patient is INPATIENT. Inpatient status is judged to be reasonable and necessary in order to provide  the required intensity of service to ensure the patient's safety. The patient's presenting symptoms, physical exam findings, and initial radiographic and laboratory data in the context of their chronic comorbidities is felt to place them at high risk for further clinical deterioration. Furthermore, it is not anticipated that the patient will be medically stable for discharge from the hospital within 2 midnights of admission. The following factors support the patient status of inpatient.   " The patient's presenting symptoms include altered mental status. " The worrisome physical exam findings include confusion and agitation. " The initial radiographic and laboratory data are worrisome because of PCO2 of over 70. " The chronic co-morbidities include COPD with obstructive sleep apnea.   * I certify that at the point of admission it is my clinical judgment that the patient will require inpatient hospital care spanning beyond 2 midnights from the point of admission due to high intensity of service, high risk for further deterioration and high frequency of surveillance required.Lonia Blood MD [facility] Pager [phone]  If 7PM-7AM, please contact night-coverage [email or web address] Password [facility]  08/07/2017, 8:15 PM

## 2017-08-07 NOTE — ED Notes (Signed)
ABG drawn per RT.

## 2017-08-07 NOTE — ED Provider Notes (Addendum)
[facility] EMERGENCY DEPARTMENT Provider Note   CSN: [id number] Arrival date & time: 08/07/17  1330     History   Chief Complaint Chief Complaint  Patient presents with  . Altered Mental Status    HPI Alexis Yates is a 84 y.o. female.  Chief complaint is altered mental status  HPI: 84 year old female.  Lives independently, with a daily caregiver, and daughters to stay with her during the night.  Both daughters accompany her here.  She was brought by them POV.  Apparently she was at her baseline yesterday.  Only significant recent past medical history change was a UTI treated with 3 days of daily IM Rocephin 10 days ago.  This was done by her primary care with home health.  Apparently she gets "GI bug" with most antibiotics.  Daughters noted that this morning she got up and was able to walk by herself into another room.  This is unusual as she will normally walk with assistance only.  She was reporting to them that she had "nightmares" and reported these apparently has some visual hallucinations.  Daughters feel like she was not using her left side well and thought she might be weak on her left side.  No history of prior strokes.  While in the waiting room the daughters report that her "tongue and mouth were moving funny".  They are also unsure if she is able to see to her left side.  Her youngest daughter also stated that her mother told her that she "knew what she wanted to say but could not get the thoughts out of my head".  No vomiting.  Normal appetite.  No apparent fever.  No complaint of pain.  He is on 3 L nasal cannula O2 today.  Daughter states that she is normally on 2.  They did turn it up yesterday.  Past Medical History:  Diagnosis Date  . Arthritis   . Atherosclerosis of aorta (HCC)   . CAD (coronary artery disease)    a. 10/27 PCI with DES to RCA, diffuse nonobstructive disease, EF 50-55%  . Chronic pain   . Diverticulosis   . Dynamic left  ventricular outflow obstruction    Mod focal basal LVH. EF 60-65% w/ dynamic LVOT obstruction (Peak Gradient 138 mmHg). No RWMA. Gr 2 DD - high filling pressures. Severe SAM. ~ PAP 35 mmHg.  Marland Kitchen Elevated transaminase level   . GERD (gastroesophageal reflux disease)   . Glaucoma   . Hiatal hernia   . Hypertension   . Lung nodules    Right  . MI (myocardial infarction) (HCC) 01/09/2016  . Scoliosis   . UTI (urinary tract infection)    refractory    Patient Active Problem List   Diagnosis Date Noted  . Altered mental state 08/07/2017  . Acute and chronic respiratory failure with hypoxia (HCC) 04/04/2017  . Acute hypoxemic respiratory failure (HCC) 04/02/2017  . OSA (obstructive sleep apnea) 12/12/2016  . Periodic limb movements of sleep 12/12/2016  . Acute respiratory failure with hypoxia and hypercapnia (HCC) 09/28/2016  . CHF (congestive heart failure) (HCC) 09/23/2016  . Dyspnea 09/23/2016  . Chronic diastolic CHF (congestive heart failure) (HCC) 09/23/2016  . Dyslipidemia, goal LDL below 70 08/04/2016  . DOE (dyspnea on exertion) 06/24/2016  . Dynamic left ventricular outflow obstruction 04/24/2016  . Coronary artery disease involving native coronary artery of native heart with angina pectoris (HCC) 04/24/2016  . History of MI (myocardial infarction) 04/12/2016  . Depression with  anxiety 04/12/2016  . Chronic pain syndrome 04/12/2016  . Abdominal pain 03/07/2016  . Anemia 03/06/2016  . Other fatigue 03/06/2016  . Elevated transaminase level 03/06/2016  . Chronic pain   . Diverticulitis 02/29/2016  . ST elevation myocardial infarction (STEMI) of inferior wall, subsequent episode of care (HCC) 01/09/2016  . NSTEMI (non-ST elevated myocardial infarction) (HCC) 01/08/2016  . PID (acute pelvic inflammatory disease) 05/27/2014  . Vaginal inflammation from pessary (HCC) 05/27/2014  . Hypertension   . Hiatal hernia   . Lung nodules   . Glaucoma   . GERD (gastroesophageal reflux  disease)     Past Surgical History:  Procedure Laterality Date  . ABDOMINAL HYSTERECTOMY    . CARDIAC CATHETERIZATION N/A 01/09/2016   Procedure: Left Heart Cath and Coronary Angiography;  Surgeon: Marykay Lex, MD;  Location: [facility] INVASIVE CV LAB: mRCA 100% thrombotic lesion (PCI). o-pLAD 40%, mLAD 30%, oD1 90%. OM1 (small) 70%.  . CARDIAC CATHETERIZATION N/A 01/09/2016   Procedure: Coronary Stent Intervention;  Surgeon: Marykay Lex, MD;  Location: [facility] INVASIVE CV LAB: mRCA PCI - STENT PROMUS PREM MR 2.25X28 drug eluting stent  . EYE SURGERY    . FRACTURE SURGERY     Tibia/fibula of right leg  . TRANSTHORACIC ECHOCARDIOGRAM  01/11/2016   Mod focal basal LVH. EF 60-65% w/ dynamic LVOT obstruction (Peak Gradient 138 mmHg). No RWMA. Gr 2 DD - high filling pressures. Severe SAM. ~ PAP 35 mmHg.  Marland Kitchen TRANSTHORACIC ECHOCARDIOGRAM  07/08/2016   moderate basal hypertrophy and mild concentric hypertrophy. EF 60-65%. Dynamic outflow tract obstruction (peak gradient 17 mmHg). No aortic stenosis. GR 1 DD. Systolic anterior motion of mitral valve. No MR. Elevated PA pressures estimated at 59 mmHg.     OB History   None      Home Medications    Prior to Admission medications   Medication Sig Start Date End Date Taking? Authorizing Provider  ALPRAZolam (XANAX) 0.25 MG tablet Take 0.25-0.5 mg by mouth at bedtime as needed for anxiety or sleep.    Yes [provider]  atorvastatin (LIPITOR) 40 MG tablet Take 40 mg by mouth at bedtime.  03/16/16  Yes [provider]  bimatoprost (LUMIGAN) 0.01 % SOLN Place 1 drop into both eyes at bedtime.    Yes [provider]  Calcium Carbonate-Vitamin D (CALCIUM-D PO) Take 1 tablet by mouth at bedtime.   Yes [provider]  clopidogrel (PLAVIX) 75 MG tablet Take 1 tablet (75 mg total) by mouth daily. Patient taking differently: Take 75 mg by mouth at bedtime.  01/14/16  Yes Laverda Page B, NP  docusate sodium (COLACE) 250  MG capsule Take 250 mg by mouth 2 (two) times daily.    Yes [provider]  dorzolamide-timolol (COSOPT) 22.3-6.8 MG/ML ophthalmic solution Place 1 drop into both eyes 2 (two) times daily.   Yes [provider]  esomeprazole (NEXIUM) 40 MG capsule Take 40 mg by mouth daily. 03/19/16  Yes [provider]  furosemide (LASIX) 20 MG tablet Take 0.5 tablets (10 mg total) by mouth daily. 09/28/16  Yes Narda Bonds, MD  metoprolol tartrate (LOPRESSOR) 25 MG tablet Take 25 mg by mouth 2 (two) times daily.   Yes [provider]  morphine (MS CONTIN) 15 MG 12 hr tablet Take 7.5 mg by mouth daily.   Yes [provider]  Netarsudil Dimesylate (RHOPRESSA) 0.02 % SOLN Place 1 drop into the left eye at bedtime.   Yes [provider]  OLANZapine-FLUoxetine (SYMBYAX) 3-25 MG capsule Take 1 capsule by mouth at bedtime.    Yes [provider]  oxyCODONE (OXY IR/ROXICODONE) 5 MG immediate release tablet Take 1 tablet (5 mg total) by mouth 3 (three) times daily as needed for severe pain. Patient taking differently: Take 5 mg by mouth 2 (two) times daily as needed for breakthrough pain.  09/28/16  Yes Narda Bonds, MD  OXYGEN Inhale 2 L into the lungs See admin instructions. Use nightly while sleeping, may also uses during the day as needed for shortness of breath   Yes [provider]  Polyethyl Glycol-Propyl Glycol (SYSTANE) 0.4-0.3 % SOLN Place 1 drop into both eyes 4 (four) times daily.    Yes [provider]  pramipexole (MIRAPEX) 0.5 MG tablet Take 0.5 mg by mouth See admin instructions. Take one tablet (0.5 mg) by mouth daily at bedtime, may take another dose one hour later as needed for restless legs   Yes [provider]  prednisoLONE acetate (PRED FORTE) 1 % ophthalmic suspension Place 1 drop into the right eye 4 (four) times daily.   Yes [provider]  guaiFENesin (MUCINEX) 600 MG 12 hr tablet Take 1 tablet  (600 mg total) by mouth 2 (two) times daily. Patient not taking: Reported on 08/07/2017 04/05/17   Rolly Salter, MD  ipratropium-albuterol (DUONEB) 0.5-2.5 (3) MG/3ML SOLN Take 3 mLs by nebulization every 6 (six) hours as needed. Patient not taking: Reported on 12/31/2016 09/28/16   Narda Bonds, MD  lidocaine (LIDODERM) 5 % Place 2 patches onto the skin daily. Remove & Discard patch within 12 hours or as directed by MD Patient not taking: Reported on 08/07/2017 04/05/17   Rolly Salter, MD  methocarbamol (ROBAXIN) 500 MG tablet Take 1 tablet (500 mg total) by mouth 3 (three) times daily. Patient not taking: Reported on 08/07/2017 04/05/17   Rolly Salter, MD    Family History Family History  Problem Relation Age of Onset  . Cancer Brother        Stomach  . Heart disease Father        Died age 54  . Diabetes Mother   . Heart failure Mother        Died 84    Social History Social History   Tobacco Use  . Smoking status: Former Smoker    Packs/day: 1.00    Years: 33.00    Pack years: 33.00    Types: Cigarettes    Last attempt to quit: 03/15/1981    Years since quitting: 36.4  . Smokeless tobacco: Never Used  Substance Use Topics  . Alcohol use: No    Alcohol/week: 0.0 oz  . Drug use: No     Allergies   Penicillins and Phenergan [promethazine hcl]   Review of Systems Review of Systems  Constitutional: Negative for appetite change, chills, diaphoresis, fatigue and fever.  HENT: Negative for mouth sores, sore throat and trouble swallowing.   Eyes: Positive for visual disturbance.  Respiratory: Positive for wheezing. Negative for cough, chest tightness and shortness of breath.   Cardiovascular: Negative for chest pain.  Gastrointestinal: Negative for abdominal distention, abdominal pain, diarrhea, nausea and vomiting.  Endocrine: Negative for polydipsia, polyphagia and polyuria.  Genitourinary: Negative for dysuria, frequency and hematuria.  Musculoskeletal: Negative  for gait problem.  Skin: Negative for color change, pallor and rash.  Neurological: Positive for speech difficulty and weakness. Negative for dizziness, syncope, light-headedness and headaches.  Hematological: Does not bruise/bleed easily.  Psychiatric/Behavioral: Positive for confusion. Negative for behavioral problems.     Physical Exam Updated Vital Signs BP (!) 105/53   Pulse 71   Temp 97.6 F (36.4 C) (Oral)   Resp (!) 24   SpO2 99%   Physical Exam  Constitutional: She is oriented to person, place, and time. No distress.  Frail-appearing 84 year old.  Awake.  Answers to voice.  HENT:  Head: Normocephalic.  Eyes: Pupils are equal, round, and reactive to light. Conjunctivae are normal. No scleral icterus.  Opacities consistent with cataracts.  Neck: Normal range of motion. Neck supple. No thyromegaly present.  No carotid bruits  Cardiovascular: Normal rate and regular rhythm. Exam reveals no gallop and no friction rub.  No murmur heard. Sinus rhythm.  No ectopy.  Pulmonary/Chest: Effort normal and breath sounds normal. No respiratory distress. She has no wheezes. She has no rales.  Scoliosis.  Minimal wheeze.  No increased work of breathing.  Abdominal: Soft. Bowel sounds are normal. She exhibits no distension. There is no tenderness. There is no rebound.  Musculoskeletal: Normal range of motion.  Neurological: She is alert and oriented to person, place, and time.  She can see to her right and identify the color to make sure.  When asked her to look to her left she turns her head far to the left but can identify the color of her daughters shirt question left hemianopsia.  Hold both arms against gravity without drift.  Can hold both legs against gravity without drift.  Level of consciousness is normal.  Skin: Skin is warm and dry. No rash noted.  Psychiatric: She has a normal mood and affect. Her behavior is normal.     ED Treatments / Results  Labs (all labs ordered are  listed, but only abnormal results are displayed) Labs Reviewed  CBC - Abnormal; Notable for the following components:      Result Value   MCHC 29.7 (*)    All other components within normal limits  COMPREHENSIVE METABOLIC PANEL - Abnormal; Notable for the following components:   Chloride 98 (*)    CO2 35 (*)    Glucose, Bld 121 (*)    All other components within normal limits  URINALYSIS, ROUTINE W REFLEX MICROSCOPIC - Abnormal; Notable for the following components:   APPearance HAZY (*)    All other components within normal limits  I-STAT CHEM 8, ED - Abnormal; Notable for the following components:   Chloride 96 (*)    Glucose, Bld 121 (*)    TCO2 38 (*)    All other components within normal limits  I-STAT ARTERIAL BLOOD GAS, ED - Abnormal; Notable for the following components:   pH, Arterial 7.335 (*)    pCO2 arterial 68.3 (*)    Bicarbonate 36.4 (*)    TCO2 38 (*)    Acid-Base Excess 8.0 (*)    All other components within normal limits  PROTIME-INR  APTT  DIFFERENTIAL  BLOOD GAS, ARTERIAL  I-STAT TROPONIN, ED  CBG MONITORING, ED    EKG None  Radiology Dg Chest 2 View  Result Date: 08/07/2017 CLINICAL DATA:  Shortness of breath. EXAM: CHEST - 2 VIEW COMPARISON:  Radiographs of April 22, 2017. FINDINGS: The heart size and mediastinal contours are within normal limits. No pneumothorax or pleural effusion is noted. Lungs are clear. Status post right shoulder arthroplasty. Severe degenerative changes seen involving the left glenohumeral joint. Thoracic kyphoscoliosis is noted. IMPRESSION: No active cardiopulmonary disease.  Electronically Signed   By: Lupita Raider, M.D.   On: 08/07/2017 14:14   Ct Head Wo Contrast  Result Date: 08/07/2017 CLINICAL DATA:  Altered mental status. EXAM: CT HEAD WITHOUT CONTRAST TECHNIQUE: Contiguous axial images were obtained from the base of the skull through the vertex without intravenous contrast. COMPARISON:  None. FINDINGS: Brain:  Diffusely enlarged ventricles and subarachnoid spaces. Patchy white matter low density in both cerebral hemispheres. No intracranial hemorrhage, mass lesion or CT evidence of acute infarction. Vascular: No hyperdense vessel or unexpected calcification. Skull: Normal. Negative for fracture or focal lesion. Sinuses/Orbits: Normally pneumatized paranasal sinuses. Status post bilateral cataract extraction. Other: None. IMPRESSION: 1. No acute abnormality. 2. Stable mild to moderate diffuse cerebral atrophy and minimal chronic small vessel white matter ischemic changes in both cerebral hemispheres. Electronically Signed   By: Beckie Salts M.D.   On: 08/07/2017 14:49   Mr Brain Wo Contrast  Result Date: 08/07/2017 CLINICAL DATA:  Altered level of consciousness, unexplained, possible hemianopia. EXAM: MRI HEAD WITHOUT CONTRAST TECHNIQUE: Multiplanar, multiecho pulse sequences of the brain and surrounding structures were obtained without intravenous contrast. COMPARISON:  CT head earlier today. FINDINGS: The patient was unable to remain motionless for the exam. Small or subtle lesions could be overlooked. Brain: No acute infarction, hemorrhage, hydrocephalus, extra-axial collection or mass lesion. Generalized atrophy. Mild chronic microvascular ischemic change. Falx ossification. Vascular: Normal flow voids. Skull and upper cervical spine: Grossly negative. Sinuses/Orbits: No sinus or mastoid disease.  Negative orbits. Other: None. IMPRESSION: Motion degraded exam. Atrophy and relatively mild changes of small vessel disease. No acute intracranial findings. Electronically Signed   By: Elsie Stain M.D.   On: 08/07/2017 18:26    Procedures Procedures (including critical care time)  Medications Ordered in ED Medications  albuterol (PROVENTIL) (2.5 MG/3ML) 0.083% nebulizer solution 2.5 mg (2.5 mg Nebulization Given 08/07/17 1557)     Initial Impression / Assessment and Plan / ED Course  I have reviewed the  triage vital signs and the nursing notes.  Pertinent labs & imaging results that were available during my care of the patient were reviewed by me and considered in my medical decision making (see chart for details).    Initial labs, CT head, chest x-ray showed no acute findings.  Plan ABG to check PCO2.  She has had "trouble with this in the past" per her daughters.  Plan cath urine for UA and culture, MRI.  Reevaluation.  Patient remains confused.  Slightly more somnolent.  Still arousable.  Placed on BiPAP because PCO2 is elevated and acidotic.  Remainder of her work-up including urine, and MRI showed no acute findings.  Discussed with hospitalist.  They were agreeable to admission for observation and recheck gas and clinical exam for improvement with treatment for probable hypercarbic/hypercapnic acute respiratory failure  CRITICAL CARE Performed by: Claudean Kinds   Total critical care time: 30 minutes  Critical care time was exclusive of separately billable procedures and treating other patients.  Critical care was necessary to treat or prevent imminent or life-threatening deterioration.  Critical care was time spent personally by me on the following activities: development of treatment plan with patient and/or surrogate as well as nursing, discussions with consultants, evaluation of patient's response to treatment, examination of patient, obtaining history from patient or surrogate, ordering and performing treatments and interventions, ordering and review of laboratory studies, ordering and review of radiographic studies, pulse oximetry and re-evaluation of patient's condition.   Final Clinical Impressions(s) /  ED Diagnoses   Final diagnoses:  Acute on chronic respiratory failure with hypercapnia [facility])    ED Discharge Orders    None       Rolland Porter, MD 08/07/17 2019    Rolland Porter, MD 08/07/17 2020    Rolland Porter, MD 08/25/17 [phone]

## 2017-08-07 NOTE — ED Triage Notes (Signed)
Pt presents for evaluation of sob x 2-3 days. Pt reports wearing 2-3 L at home. Pt reports hx of COPD

## 2017-08-08 DIAGNOSIS — E785 Hyperlipidemia, unspecified: Secondary | ICD-10-CM

## 2017-08-08 LAB — CBC
HEMATOCRIT: 38.1 % (ref 36.0–46.0)
HEMATOCRIT: 38.6 % (ref 36.0–46.0)
Hemoglobin: 11.5 g/dL — ABNORMAL LOW (ref 12.0–15.0)
Hemoglobin: 11.7 g/dL — ABNORMAL LOW (ref 12.0–15.0)
MCH: 29.6 pg (ref 26.0–34.0)
MCH: 30.1 pg (ref 26.0–34.0)
MCHC: 30.2 g/dL (ref 30.0–36.0)
MCHC: 30.3 g/dL (ref 30.0–36.0)
MCV: 97.9 fL (ref 78.0–100.0)
MCV: 99.2 fL (ref 78.0–100.0)
Platelets: 213 10*3/uL (ref 150–400)
Platelets: 239 10*3/uL (ref 150–400)
RBC: 3.89 MIL/uL (ref 3.87–5.11)
RBC: 3.89 MIL/uL (ref 3.87–5.11)
RDW: 14.4 % (ref 11.5–15.5)
RDW: 14.6 % (ref 11.5–15.5)
WBC: 9.9 10*3/uL (ref 4.0–10.5)
WBC: 8.9 10*3/uL (ref 4.0–10.5)

## 2017-08-08 LAB — TROPONIN I
Troponin I: <0.03 ng/mL (ref <0.03)
Troponin I: <0.03 ng/mL (ref <0.03)

## 2017-08-08 LAB — COMPREHENSIVE METABOLIC PANEL
ALT: 13 U/L — AB (ref 14–54)
AST: 20 U/L (ref 15–41)
Albumin: 3.2 g/dL — ABNORMAL LOW (ref 3.5–5.0)
Alkaline Phosphatase: 62 U/L (ref 38–126)
Anion gap: 12 (ref 5–15)
BUN: 7 mg/dL (ref 6–20)
CHLORIDE: 98 mmol/L — AB (ref 101–111)
CO2: 32 mmol/L (ref 22–32)
CREATININE: 0.77 mg/dL (ref 0.44–1.00)
Calcium: 9.4 mg/dL (ref 8.9–10.3)
GFR calc Af Amer: >60 mL/min (ref >60)
GFR calc non Af Amer: >60 mL/min (ref >60)
Glucose, Bld: 92 mg/dL (ref 65–99)
POTASSIUM: 4.2 mmol/L (ref 3.5–5.1)
SODIUM: 142 mmol/L (ref 135–145)
Total Bilirubin: 0.7 mg/dL (ref 0.3–1.2)
Total Protein: 6 g/dL — ABNORMAL LOW (ref 6.5–8.1)

## 2017-08-08 LAB — BLOOD GAS, ARTERIAL
Acid-Base Excess: 8.6 mmol/L — ABNORMAL HIGH (ref 0.0–2.0)
Bicarbonate: 33.8 mmol/L — ABNORMAL HIGH (ref 20.0–28.0)
Drawn by: 275531
O2 Content: 2 L/min
O2 SAT: 97 %
PCO2 ART: 58.5 mmHg — AB (ref 32.0–48.0)
PH ART: 7.38 (ref 7.350–7.450)
Patient temperature: 98.6
pO2, Arterial: 93.8 mmHg (ref 83.0–108.0)

## 2017-08-08 LAB — CREATININE, SERUM: Creatinine, Ser: 0.69 mg/dL (ref 0.44–1.00)

## 2017-08-08 LAB — AMMONIA: AMMONIA: 12 umol/L (ref 9–35)

## 2017-08-08 LAB — TSH
TSH: 2.533 u[IU]/mL (ref 0.350–4.500)
TSH: 3.829 u[IU]/mL (ref 0.350–4.500)

## 2017-08-08 MED ORDER — QUETIAPINE FUMARATE 25 MG PO TABS: 25.0000 mg | ORAL_TABLET | Freq: Every evening | ORAL | Status: DC | PRN

## 2017-08-08 NOTE — Plan of Care (Signed)
  Problem: Education: Goal: Knowledge of General Education information will improve Outcome: Progressing   Problem: Clinical Measurements: Goal: Ability to maintain clinical measurements within normal limits will improve Outcome: Progressing   Problem: Coping: Goal: Level of anxiety will decrease Outcome: Progressing   Problem: Pain Managment: Goal: General experience of comfort will improve Outcome: Progressing

## 2017-08-08 NOTE — Progress Notes (Signed)
Pt admitted to 5w11 via stretcher from ED by transporter. Pt is A&OX3, breathing equal and unlabored in NAD. VS was taken, bath was given, and placed on tele. Pt was given oxy 5mg  for chronic shoulder and knee pain.

## 2017-08-08 NOTE — Progress Notes (Signed)
Patients daughter states patient has been unable to void (via purwic) in 4 hours. Patient has urge to void but unable. Bladder scan revealed . Have notified MD for further instructions.

## 2017-08-08 NOTE — ED Notes (Signed)
Meds to be sent from pharmacy.

## 2017-08-08 NOTE — ED Notes (Signed)
7.5mg  morphine extended release wasted with kelly RN, charge nurse.

## 2017-08-08 NOTE — ED Notes (Signed)
Pt attempts to pull saline lock line out when trying to start continuous fluids. Fluids not hung

## 2017-08-08 NOTE — Consult Note (Addendum)
NEUROLOGY CONSULT  Reason for Consult: AMS, agitation Referring Physician: Dr Margo Aye  CC: none per pt  HPI: Alexis Yates is an 84 y.o. female with medical history significant ofchronic respiratory failure due to COPD with CO2 retention, obstructive sleep apnea with non-compliant CPAP use, history of non-ST elevation MI, hypertension. She presented on 08/07/17 will progressive altered mental status and confusion. P02 74, C02 53.7 (higher than her baseline), HCO3 34. BiPAP was attempted, but pt did not tolerate and became agitated and delirius, requiring sedation. She has used opioids chronically for many years for her debilitating arthritis. ABG: pH 7.38, PCO2 58, PO2 93. She is now lethargic upon exam this morning.  CTH and MRI neg for any acute findings. She has no formal diagnosis of dementia, but upon careful history and interview with family, she has been declining over the last year, not driving or managing her finances and has required 24/7 care and supervision since first of Feb 2019.    Past Medical History Past Medical History:  Diagnosis Date  . Arthritis   . Atherosclerosis of aorta (HCC)   . CAD (coronary artery disease)    a. 10/27 PCI with DES to RCA, diffuse nonobstructive disease, EF 50-55%  . Chronic pain   . Diverticulosis   . Dynamic left ventricular outflow obstruction    Mod focal basal LVH. EF 60-65% w/ dynamic LVOT obstruction (Peak Gradient 138 mmHg). No RWMA. Gr 2 DD - high filling pressures. Severe SAM. ~ PAP 35 mmHg.  Marland Kitchen Elevated transaminase level   . GERD (gastroesophageal reflux disease)   . Glaucoma   . Hiatal hernia   . Hypertension   . Lung nodules    Right  . MI (myocardial infarction) (HCC) 01/09/2016  . Scoliosis   . UTI (urinary tract infection)    refractory    Past Surgical History Past Surgical History:  Procedure Laterality Date  . ABDOMINAL HYSTERECTOMY    . CARDIAC CATHETERIZATION N/A 01/09/2016   Procedure: Left Heart Cath and  Coronary Angiography;  Surgeon: Marykay Lex, MD;  Location: [facility] INVASIVE CV LAB: mRCA 100% thrombotic lesion (PCI). o-pLAD 40%, mLAD 30%, oD1 90%. OM1 (small) 70%.  . CARDIAC CATHETERIZATION N/A 01/09/2016   Procedure: Coronary Stent Intervention;  Surgeon: Marykay Lex, MD;  Location: [facility] INVASIVE CV LAB: mRCA PCI - STENT PROMUS PREM MR 2.25X28 drug eluting stent  . EYE SURGERY    . FRACTURE SURGERY     Tibia/fibula of right leg  . TRANSTHORACIC ECHOCARDIOGRAM  01/11/2016   Mod focal basal LVH. EF 60-65% w/ dynamic LVOT obstruction (Peak Gradient 138 mmHg). No RWMA. Gr 2 DD - high filling pressures. Severe SAM. ~ PAP 35 mmHg.  Marland Kitchen TRANSTHORACIC ECHOCARDIOGRAM  07/08/2016   moderate basal hypertrophy and mild concentric hypertrophy. EF 60-65%. Dynamic outflow tract obstruction (peak gradient 17 mmHg). No aortic stenosis. GR 1 DD. Systolic anterior motion of mitral valve. No MR. Elevated PA pressures estimated at 59 mmHg.    Family History Family History  Problem Relation Age of Onset  . Cancer Brother        Stomach  . Heart disease Father        Died age 34  . Diabetes Mother   . Heart failure Mother        Died 49    Social History    reports that she quit smoking about 36 years ago. Her smoking use included cigarettes. She has a 33.00 pack-year smoking history. She has  never used smokeless tobacco. She reports that she does not drink alcohol or use drugs.  Allergies Allergies  Allergen Reactions  . Penicillins Anaphylaxis    Has patient had a PCN reaction causing immediate rash, facial/tongue/throat swelling, SOB or lightheadedness with hypotension: Yes Has patient had a PCN reaction causing severe rash involving mucus membranes or skin necrosis: No Has patient had a PCN reaction that required hospitalization Yes Has patient had a PCN reaction occurring within the last 10 years: No If all of the above answers are "NO", then may proceed with Cephalosporin use.   Marland Kitchen Phenergan  [Promethazine Hcl] Other (See Comments)    Restless legs    Home Medications Medications Prior to Admission  Medication Sig Dispense Refill  . ALPRAZolam (XANAX) 0.25 MG tablet Take 0.25-0.5 mg by mouth at bedtime as needed for anxiety or sleep.     Marland Kitchen atorvastatin (LIPITOR) 40 MG tablet Take 40 mg by mouth at bedtime.   5  . bimatoprost (LUMIGAN) 0.01 % SOLN Place 1 drop into both eyes at bedtime.     . Calcium Carbonate-Vitamin D (CALCIUM-D PO) Take 1 tablet by mouth at bedtime.    . clopidogrel (PLAVIX) 75 MG tablet Take 1 tablet (75 mg total) by mouth daily. (Patient taking differently: Take 75 mg by mouth at bedtime. ) 30 tablet 11  . docusate sodium (COLACE) 250 MG capsule Take 250 mg by mouth 2 (two) times daily.     . dorzolamide-timolol (COSOPT) 22.3-6.8 MG/ML ophthalmic solution Place 1 drop into both eyes 2 (two) times daily.    Marland Kitchen esomeprazole (NEXIUM) 40 MG capsule Take 40 mg by mouth daily.  3  . furosemide (LASIX) 20 MG tablet Take 0.5 tablets (10 mg total) by mouth daily.    . metoprolol tartrate (LOPRESSOR) 25 MG tablet Take 25 mg by mouth 2 (two) times daily.    Marland Kitchen morphine (MS CONTIN) 15 MG 12 hr tablet Take 7.5 mg by mouth 2 (two) times daily.     Heywood Iles Dimesylate (RHOPRESSA) 0.02 % SOLN Place 1 drop into the left eye at bedtime.    Marland Kitchen OLANZapine-FLUoxetine (SYMBYAX) 3-25 MG capsule Take 1 capsule by mouth at bedtime.     Marland Kitchen oxyCODONE (OXY IR/ROXICODONE) 5 MG immediate release tablet Take 1 tablet (5 mg total) by mouth 3 (three) times daily as needed for severe pain. (Patient taking differently: Take 5 mg by mouth 2 (two) times daily as needed for breakthrough pain. ) 5 tablet 0  . OXYGEN Inhale 2 L into the lungs See admin instructions. Use nightly while sleeping, may also uses during the day as needed for shortness of breath    . Polyethyl Glycol-Propyl Glycol (SYSTANE) 0.4-0.3 % SOLN Place 1 drop into both eyes 4 (four) times daily.     . pramipexole (MIRAPEX) 0.5 MG  tablet Take 0.5 mg by mouth See admin instructions. Take one tablet (0.5 mg) by mouth daily at bedtime, may take another dose one hour later as needed for restless legs    . prednisoLONE acetate (PRED FORTE) 1 % ophthalmic suspension Place 1 drop into the right eye 4 (four) times daily.    Marland Kitchen guaiFENesin (MUCINEX) 600 MG 12 hr tablet Take 1 tablet (600 mg total) by mouth 2 (two) times daily. (Patient not taking: Reported on 08/07/2017) 30 tablet 0  . ipratropium-albuterol (DUONEB) 0.5-2.5 (3) MG/3ML SOLN Take 3 mLs by nebulization every 6 (six) hours as needed. (Patient not taking: Reported on 12/31/2016)    .  lidocaine (LIDODERM) 5 % Place 2 patches onto the skin daily. Remove & Discard patch within 12 hours or as directed by MD (Patient not taking: Reported on 08/07/2017) 30 patch 0  . methocarbamol (ROBAXIN) 500 MG tablet Take 1 tablet (500 mg total) by mouth 3 (three) times daily. (Patient not taking: Reported on 08/07/2017) 15 tablet 0    Hospital Medications . atorvastatin  40 mg Oral QHS  . calcium-vitamin D  1 tablet Oral QHS  . clopidogrel  75 mg Oral QHS  . docusate sodium  200 mg Oral BID  . dorzolamide-timolol  1 drop Both Eyes BID  . enoxaparin (LOVENOX) injection  40 mg Subcutaneous Daily  . furosemide  10 mg Oral Daily  . latanoprost  1 drop Both Eyes QHS  . metoprolol tartrate  25 mg Oral BID  . morphine  15 mg Oral Daily  . Netarsudil Dimesylate  1 drop Left Eye QHS  . OLANZapine-FLUoxetine  1 capsule Oral QHS  . pantoprazole  40 mg Oral Daily  . polyvinyl alcohol  1 drop Both Eyes TID AC & HS  . pramipexole  0.5 mg Oral QHS,MR X 1  . prednisoLONE acetate  1 drop Right Eye TID AC & HS     ROS: History obtained from family  General ROS: negative for - chills, fever, night sweats, weight gain or weight loss. Pt is always fatigued per family Psychological ROS: negative for - behavioral disorder, mood swings or suicidal ideation. Family reports occ hallucinations, memory  difficulties. Ophthalmic ROS: negative for - blurry vision, double vision, eye pain or loss of vision ENT ROS: negative for - epistaxis, nasal discharge, oral lesions, sore throat, tinnitus or vertigo Allergy and Immunology ROS: negative for - hives or itchy/watery eyes Hematological and Lymphatic ROS: negative for - bleeding problems, bruising or swollen lymph nodes Endocrine ROS: negative for - galactorrhea, hair pattern changes, polydipsia/polyuria or temperature intolerance Respiratory ROS: currently with shortness of breath, work of breathing, wheezing Cardiovascular ROS: negative for - chest pain, dyspnea on exertion, edema or irregular heartbeat Gastrointestinal ROS: negative for - abdominal pain, diarrhea, hematemesis, nausea/vomiting or stool incontinence Genito-Urinary ROS: negative for - dysuria, hematuria, incontinence or urinary frequency/urgency Musculoskeletal ROS: negative for - joint swelling or muscular weakness Neurological ROS: as noted in HPI Dermatological ROS: negative for rash and skin lesion changes   Physical Examination:  Vitals:   08/08/17 0000 08/08/17 0030 08/08/17 0201 08/08/17 0544  BP: (!) 140/98 130/82 91/77 129/71  Pulse: 81  70 66  Resp: 17 (!) 31 18 18   Temp:   98.3 F (36.8 C) 98.1 F (36.7 C)  TempSrc:   Oral   SpO2: 95%  100% 100%    General - frail, elderly, chronically ill Heart - Regular rate and rhythm - no murmer Lungs -  bronchial sounds in upper airway. Mouth breathing and using accessory muscles.  Abdomen - Soft - non tender Extremities - Distal pulses intact - no edema Musculoskeletal deformity in hands, feet, knees. Decreased ROM Skin - Warm and dry  Neurologic Examination:   Mental Status:  Lethargic, difficult to keep awake during exam. Will awaken briefly to tactile + verbal command. Follows brief commands Cranial Nerves:  II-bilateral visual fields intact III/IV/VI-Pupils were equal and reacted. Extraocular movements were  full.  V/VII-no facial numbness and no facial weakness.  VIII-hearing normal.  X-normal speech and symmetrical palatal movement.  XII-midline tongue extension  Motor: gen and diffusely weak, equal in all ext Tone  and bulk:normal tone throughout; no atrophy noted Sensory: Intact to light touch in all extremities. Plantars: Downgoing bilaterally  Cerebellar: limited d/t loc Gait: not tested   LABORATORY STUDIES:  Basic Metabolic Panel: Recent Labs  Lab 08/07/17 1339 08/07/17 1434 08/07/17 2349 08/08/17 0732  NA 140 140  --  142  K 4.5 4.6  --  4.2  CL 98* 96*  --  98*  CO2 35*  --   --  32  GLUCOSE 121* 121*  --  92  BUN 8 10  --  7  CREATININE 0.66 0.70 0.69 0.77  CALCIUM 9.4  --   --  9.4    Liver Function Tests: Recent Labs  Lab 08/07/17 1339 08/08/17 0732  AST 27 20  ALT 14 13*  ALKPHOS 70 62  BILITOT 0.3 0.7  PROT 6.5 6.0*  ALBUMIN 3.5 3.2*   CBC: Recent Labs  Lab 08/07/17 1339 08/07/17 1434 08/07/17 2349 08/08/17 0732  WBC 7.2  --  8.9 9.9  NEUTROABS 4.7  --   --   --   HGB 12.2 13.3 11.7* 11.5*  HCT 41.1 39.0 38.6 38.1  MCV 97.4  --  99.2 97.9  PLT 268  --  239 213    Cardiac Enzymes: Recent Labs  Lab 08/08/17 0101 08/08/17 0732  TROPONINI <0.03 <0.03   CBG: Recent Labs  Lab 08/07/17 2155  GLUCAP 149*    Coagulation Studies: Recent Labs    08/07/17 1339  LABPROT 13.0  INR 0.99    Urinalysis:  Recent Labs  Lab 08/07/17 1531  COLORURINE YELLOW  LABSPEC 1.018  PHURINE 6.0  GLUCOSEU NEGATIVE  HGBUR NEGATIVE  BILIRUBINUR NEGATIVE  KETONESUR NEGATIVE  PROTEINUR NEGATIVE  NITRITE NEGATIVE  LEUKOCYTESUR NEGATIVE    Lipid Panel:     Component Value Date/Time   CHOL 148 01/09/2016 0523   TRIG 21 01/09/2016 0523   HDL 75 01/09/2016 0523   CHOLHDL 2.0 01/09/2016 0523   VLDL 4 01/09/2016 0523   LDLCALC 69 01/09/2016 0523    HgbA1C:  Lab Results  Component Value Date   HGBA1C 6.4 (H) 09/23/2016   IMAGING  Reviewed: Dg Chest 2 View  Result Date: 08/07/2017 CLINICAL DATA:  Shortness of breath. EXAM: CHEST - 2 VIEW COMPARISON:  Radiographs of April 22, 2017. FINDINGS: The heart size and mediastinal contours are within normal limits. No pneumothorax or pleural effusion is noted. Lungs are clear. Status post right shoulder arthroplasty. Severe degenerative changes seen involving the left glenohumeral joint. Thoracic kyphoscoliosis is noted. IMPRESSION: No active cardiopulmonary disease. Electronically Signed   By: Lupita Raider, M.D.   On: 08/07/2017 14:14   Ct Head Wo Contrast  Result Date: 08/07/2017 CLINICAL DATA:  Altered mental status. EXAM: CT HEAD WITHOUT CONTRAST TECHNIQUE: Contiguous axial images were obtained from the base of the skull through the vertex without intravenous contrast. COMPARISON:  None. FINDINGS: Brain: Diffusely enlarged ventricles and subarachnoid spaces. Patchy white matter low density in both cerebral hemispheres. No intracranial hemorrhage, mass lesion or CT evidence of acute infarction. Vascular: No hyperdense vessel or unexpected calcification. Skull: Normal. Negative for fracture or focal lesion. Sinuses/Orbits: Normally pneumatized paranasal sinuses. Status post bilateral cataract extraction. Other: None. IMPRESSION: 1. No acute abnormality. 2. Stable mild to moderate diffuse cerebral atrophy and minimal chronic small vessel white matter ischemic changes in both cerebral hemispheres. Electronically Signed   By: Beckie Salts M.D.   On: 08/07/2017 14:49   Mr Brain Wo Contrast  Result Date: 08/07/2017 CLINICAL DATA:  Altered level of consciousness, unexplained, possible hemianopia. EXAM: MRI HEAD WITHOUT CONTRAST TECHNIQUE: Multiplanar, multiecho pulse sequences of the brain and surrounding structures were obtained without intravenous contrast. COMPARISON:  CT head earlier today. FINDINGS: The patient was unable to remain motionless for the exam. Small or subtle lesions could  be overlooked. Brain: No acute infarction, hemorrhage, hydrocephalus, extra-axial collection or mass lesion. Generalized atrophy. Mild chronic microvascular ischemic change. Falx ossification. Vascular: Normal flow voids. Skull and upper cervical spine: Grossly negative. Sinuses/Orbits: No sinus or mastoid disease.  Negative orbits. Other: None. IMPRESSION: Motion degraded exam. Atrophy and relatively mild changes of small vessel disease. No acute intracranial findings. Electronically Signed   By: Elsie Stain M.D.   On: 08/07/2017 18:26     Assessment/Plan: This is a 84yr old with likely underlying and undiagnosed dementia. Acutely decompensated in setting of hypercapnia and hypoxia.  # Acute encephalopathy, agitation- multifactoral, toxic/metabolic d/t p02 16X and CO2 58. Additionally, with likely underlying dementia, she is at high risk of delirium and likely has a poor neurologic reserve. MRI shows a good bit of atrophy and relative small vessel dz. Would expect the brain to lag behind correction of 02/C02.  # COPD- acute hypoxic and hypercarbic respiratory failure w/OSA complicating this. On home 02, but noncompliant w/CPAP # Chronic pain d/t severe arthritis and debility at baseline- uses walker and needs total assistance at baseline. Chronic opioid use.  PLAN/RECS: Avoid sedation meds Hold PO while lethargic D/C Xanex Prefer atypical antipsychotics over benzos to treat agitation/delirium, Give Seroquel 25mg  Qhs or 12.5mg  PRN agitation  I have explained to family to expect her encephalopathy/agitation/delirium to lag behind any corrections in underlying causes.  After long d/w pts dtr at bedside who cares for her at home, it seems that family would like to plan for long term SNF care and possibly even hospice care.  Case mgt/LCSW consult for follow up d/w pts dtr on long term goals of care/dispo I have d/w Dr Amada Jupiter, neurology attending the above plan.  Attending neurologist's note to  follow  I have seen the patient and reviewed the above note.    Her mental status I think is explainable by her respiratory failure and multiple sedating medications.  Her current state is likely more explained due to the medication letter she received this morning including Haldol.  I think that she is at high risk of getting her days and nights mixed up in the hospital, and at high risk for continued delirium.  If this continues to be a problem then low-dose Seroquel at night can sometimes be helpful.  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  Ritta Slot, MD [facility] [phone]  If 7pm- 7am, please page neurology on call as listed in AMION.

## 2017-08-08 NOTE — Progress Notes (Signed)
Patient had an urine output of .  Will not in and out cath the patient at this time.  Will continue to monitor the patient and notify MD as needed.

## 2017-08-08 NOTE — Progress Notes (Addendum)
PROGRESS NOTE  Alexis Yates UEA:[id number] DOB: Jun 01, 1927 DOA: 08/07/2017 PCP: Marden Noble, MD  HPI/Recap of past 24 hours: Alexis Yates is a 84 y.o. female with medical history significant of chronic respiratory failure due to COPD with CO2 retention, obstructive sleep apnea with patient refusing to use CPAP, history of non-ST elevation MI, hypertension who presented today with altered mental status. She has 2 daughters that live with her and noted the confusion. Patient was initially thought to have had a CVA. Head CT as well as MRI were both negative. All initial evaluations negative except increase PCO2 from her baseline level. Patient initiated on BiPAP in the ER but did not tolerate it well. She is currently sedated. She has no prior history of dementia. She appears to be delirious at the moment.  08/08/2017: Patient seen and examined at bedside she is somnolent but she arouses to voices.  Not using BiPAP in the room.  Hypercarbic on presentation.  Daughter in the room reports unusual appearance and requests neurology consult.  CT head/MRI brain unremarkable for any acute intracranial findings.  Urinalysis, TSH, lab studies unremarkable.  Repeated ABG this morning.  pH 7.38, PCO2 58, PO2 93.  Patient is not back to her baseline. Persistent somnolence. Suspect multifactorial 2/2 to hypercarbia vs polypharmacy with chronic narcotic use and chronic benzodiazepine use.  Assessment/Plan: Active Problems:   Depression with anxiety   Chronic pain syndrome   Coronary artery disease involving native coronary artery of native heart with angina pectoris (HCC)   Dyslipidemia, goal LDL below 70   Altered mental state  Acute metabolic encephalopathy most likely secondary to hypercarbia vs others, persistent CT head with no contrast MRI brain unremarkable for any intracranial findings Personally reviewed imagings with no acute intracranial findings. Suspect component of delirium from  underlying dementia Obtain ammonia level which is unremarkable Patient is not back to her baseline and remains persistently somnolent Suspect polypharmacy with chronic narcotic use and chronic benzodiazepine use Neurology consulted.  Highly appreciated. Seroquel 25 mg qhs prn for agitation  Acute hypercarbic respiratory failure In the setting of OSA and noncompliance with CPAP  Chronic pain syndrome Hold off narcotics and sedating agents  Hypertension Blood pressures well controlled Continue home medications  OSA Noncompliant with CPAP Needs education/reinforcement  Coronary artery disease No chest pain reported Continue current medications Plavix and statin  Chronic depression/anxiety Hold off benzodiazepine  C/w Symbyax  GERD Continue PPI  Risks: High risk for decompensation due to persistent altered mental status in the setting of hypercarbia, polypharmacy with chronic narcotic use and chronic benzodiazepine use.    Code Status: Full code  Family Communication: Daughter at bedside  Disposition Plan: Home/SNF when clinically stable   Consultants:  Neurology  Procedures:  None  Antimicrobials:  None  DVT prophylaxis: Lovenox subcu   Objective: Vitals:   08/08/17 0000 08/08/17 0030 08/08/17 0201 08/08/17 0544  BP: (!) 140/98 130/82 91/77 129/71  Pulse: 81  70 66  Resp: 17 (!) 31 18 18   Temp:   98.3 F (36.8 C) 98.1 F (36.7 C)  TempSrc:   Oral   SpO2: 95%  100% 100%    Intake/Output Summary (Last 24 hours) at 08/08/2017 1213 Last data filed at 08/08/2017 0517 Gross per 24 hour  Intake 431.25 ml  Output 0 ml  Net 431.25 ml   There were no vitals filed for this visit.  Exam:  . General: 84 y.o. year-old female well developed well nourished in no acute  distress.  Somnolent but arousable to voices.   . Cardiovascular: Regular rate and rhythm with no rubs or gallops.  No thyromegaly or JVD noted.   Marland Kitchen Respiratory: Clear to auscultation  with no wheezes or rales. Good inspiratory effort. . Abdomen: Soft nontender nondistended with normal bowel sounds x4 quadrants. . Musculoskeletal: No lower extremity edema. 2/4 pulses in all 4 extremities. . Skin: No ulcerative lesions noted or rashes, . Psychiatry: Mood is appropriate for condition and setting   Data Reviewed: CBC: Recent Labs  Lab 08/07/17 1339 08/07/17 1434 08/07/17 2349 08/08/17 0732  WBC 7.2  --  8.9 9.9  NEUTROABS 4.7  --   --   --   HGB 12.2 13.3 11.7* 11.5*  HCT 41.1 39.0 38.6 38.1  MCV 97.4  --  99.2 97.9  PLT 268  --  239 213   Basic Metabolic Panel: Recent Labs  Lab 08/07/17 1339 08/07/17 1434 08/07/17 2349 08/08/17 0732  NA 140 140  --  142  K 4.5 4.6  --  4.2  CL 98* 96*  --  98*  CO2 35*  --   --  32  GLUCOSE 121* 121*  --  92  BUN 8 10  --  7  CREATININE 0.66 0.70 0.69 0.77  CALCIUM 9.4  --   --  9.4   GFR: CrCl cannot be calculated (Unknown ideal weight.). Liver Function Tests: Recent Labs  Lab 08/07/17 1339 08/08/17 0732  AST 27 20  ALT 14 13*  ALKPHOS 70 62  BILITOT 0.3 0.7  PROT 6.5 6.0*  ALBUMIN 3.5 3.2*   No results for input(s): LIPASE, AMYLASE in the last 168 hours. No results for input(s): AMMONIA in the last 168 hours. Coagulation Profile: Recent Labs  Lab 08/07/17 1339  INR 0.99   Cardiac Enzymes: Recent Labs  Lab 08/08/17 0101 08/08/17 0732  TROPONINI <0.03 <0.03   BNP (last 3 results) No results for input(s): PROBNP in the last 8760 hours. HbA1C: No results for input(s): HGBA1C in the last 72 hours. CBG: Recent Labs  Lab 08/07/17 2155  GLUCAP 149*   Lipid Profile: No results for input(s): CHOL, HDL, LDLCALC, TRIG, CHOLHDL, LDLDIRECT in the last 72 hours. Thyroid Function Tests: Recent Labs    08/07/17 2349  TSH 3.829   Anemia Panel: No results for input(s): VITAMINB12, FOLATE, FERRITIN, TIBC, IRON, RETICCTPCT in the last 72 hours. Urine analysis:    Component Value Date/Time    COLORURINE YELLOW 08/07/2017 1531   APPEARANCEUR HAZY (A) 08/07/2017 1531   LABSPEC 1.018 08/07/2017 1531   PHURINE 6.0 08/07/2017 1531   GLUCOSEU NEGATIVE 08/07/2017 1531   HGBUR NEGATIVE 08/07/2017 1531   BILIRUBINUR NEGATIVE 08/07/2017 1531   KETONESUR NEGATIVE 08/07/2017 1531   PROTEINUR NEGATIVE 08/07/2017 1531   UROBILINOGEN 0.2 05/27/2014 1122   NITRITE NEGATIVE 08/07/2017 1531   LEUKOCYTESUR NEGATIVE 08/07/2017 1531   Sepsis Labs: @LABRCNTIP (procalcitonin:4,lacticidven:4)  )No results found for this or any previous visit (from the past 240 hour(s)).    Studies: Dg Chest 2 View  Result Date: 08/07/2017 CLINICAL DATA:  Shortness of breath. EXAM: CHEST - 2 VIEW COMPARISON:  Radiographs of April 22, 2017. FINDINGS: The heart size and mediastinal contours are within normal limits. No pneumothorax or pleural effusion is noted. Lungs are clear. Status post right shoulder arthroplasty. Severe degenerative changes seen involving the left glenohumeral joint. Thoracic kyphoscoliosis is noted. IMPRESSION: No active cardiopulmonary disease. Electronically Signed   By: Lupita Raider, M.D.  On: 08/07/2017 14:14   Ct Head Wo Contrast  Result Date: 08/07/2017 CLINICAL DATA:  Altered mental status. EXAM: CT HEAD WITHOUT CONTRAST TECHNIQUE: Contiguous axial images were obtained from the base of the skull through the vertex without intravenous contrast. COMPARISON:  None. FINDINGS: Brain: Diffusely enlarged ventricles and subarachnoid spaces. Patchy white matter low density in both cerebral hemispheres. No intracranial hemorrhage, mass lesion or CT evidence of acute infarction. Vascular: No hyperdense vessel or unexpected calcification. Skull: Normal. Negative for fracture or focal lesion. Sinuses/Orbits: Normally pneumatized paranasal sinuses. Status post bilateral cataract extraction. Other: None. IMPRESSION: 1. No acute abnormality. 2. Stable mild to moderate diffuse cerebral atrophy and  minimal chronic small vessel white matter ischemic changes in both cerebral hemispheres. Electronically Signed   By: Beckie Salts M.D.   On: 08/07/2017 14:49   Mr Brain Wo Contrast  Result Date: 08/07/2017 CLINICAL DATA:  Altered level of consciousness, unexplained, possible hemianopia. EXAM: MRI HEAD WITHOUT CONTRAST TECHNIQUE: Multiplanar, multiecho pulse sequences of the brain and surrounding structures were obtained without intravenous contrast. COMPARISON:  CT head earlier today. FINDINGS: The patient was unable to remain motionless for the exam. Small or subtle lesions could be overlooked. Brain: No acute infarction, hemorrhage, hydrocephalus, extra-axial collection or mass lesion. Generalized atrophy. Mild chronic microvascular ischemic change. Falx ossification. Vascular: Normal flow voids. Skull and upper cervical spine: Grossly negative. Sinuses/Orbits: No sinus or mastoid disease.  Negative orbits. Other: None. IMPRESSION: Motion degraded exam. Atrophy and relatively mild changes of small vessel disease. No acute intracranial findings. Electronically Signed   By: Elsie Stain M.D.   On: 08/07/2017 18:26    Scheduled Meds: . atorvastatin  40 mg Oral QHS  . calcium-vitamin D  1 tablet Oral QHS  . clopidogrel  75 mg Oral QHS  . docusate sodium  200 mg Oral BID  . dorzolamide-timolol  1 drop Both Eyes BID  . enoxaparin (LOVENOX) injection  40 mg Subcutaneous Daily  . furosemide  10 mg Oral Daily  . latanoprost  1 drop Both Eyes QHS  . metoprolol tartrate  25 mg Oral BID  . morphine  15 mg Oral Daily  . Netarsudil Dimesylate  1 drop Left Eye QHS  . OLANZapine-FLUoxetine  1 capsule Oral QHS  . pantoprazole  40 mg Oral Daily  . polyvinyl alcohol  1 drop Both Eyes TID AC & HS  . pramipexole  0.5 mg Oral QHS,MR X 1  . prednisoLONE acetate  1 drop Right Eye TID AC & HS    Continuous Infusions: . sodium chloride 75 mL/hr at 08/08/17 0244     LOS: 1 day     Darlin Drop, MD [facility] Pager [phone]  If 7PM-7AM, please contact night-coverage [email or web address] Password [facility] 08/08/2017, 12:13 PM

## 2017-08-08 NOTE — Progress Notes (Addendum)
Patient ALERT and oriented x 4. Was able to carry on a conversation. Lethargy has resolved. Per orders listed under NPO, Regular diet now restarted.

## 2017-08-09 DIAGNOSIS — I25119 Atherosclerotic heart disease of native coronary artery with unspecified angina pectoris: Secondary | ICD-10-CM

## 2017-08-09 DIAGNOSIS — R451 Restlessness and agitation: Secondary | ICD-10-CM

## 2017-08-09 DIAGNOSIS — I10 Essential (primary) hypertension: Secondary | ICD-10-CM

## 2017-08-09 DIAGNOSIS — J9622 Acute and chronic respiratory failure with hypercapnia: Secondary | ICD-10-CM

## 2017-08-09 MED ORDER — TRAMADOL HCL 50 MG PO TABS
50.0000 mg | ORAL_TABLET | Freq: Once | ORAL | Status: AC
  Administered 2017-08-09: 50 mg via ORAL
  Filled 2017-08-09: qty 1

## 2017-08-09 MED ORDER — AMLODIPINE BESYLATE 5 MG PO TABS
5.0000 mg | ORAL_TABLET | Freq: Every day | ORAL | Status: DC
  Administered 2017-08-09 – 2017-08-10 (×2): 5 mg via ORAL
  Filled 2017-08-09 (×2): qty 1

## 2017-08-09 NOTE — Progress Notes (Signed)
Spoke with patient regarding the use of BiPAP while she sleeps. Patient refuses for the night and states she will not wear it. RT will continue to monitor.

## 2017-08-09 NOTE — Progress Notes (Addendum)
4:30pm-Patient's daughter called CSW back and stated that they will take patient home at discharge so they can keep Hospice. MD aware. Will need PTAR.  3:43pm-CSW received call from [facility]. She states they follow the patient. CSW contacted patient's daughter and she confirmed. She was not aware that staff needed to know patient has hospice services at home. CSW explained that Insurance will not cover both Hospice and SNF. Patient's daughter will contact CSW back on a discharge plan.   Osborne Casco Ferd Horrigan LCSW [phone]

## 2017-08-09 NOTE — Progress Notes (Signed)
PROGRESS NOTE  EVVIE Yates ZOX:[id number] DOB: 1927/12/01 DOA: 08/07/2017 PCP: Marden Noble, MD  HPI/Recap of past 24 hours: Alexis Yates is a 84 y.o. female with medical history significant of chronic respiratory failure due to COPD with CO2 retention, obstructive sleep apnea with patient refusing to use CPAP, history of non-ST elevation MI, hypertension who presented today with altered mental status. She has 2 daughters that live with her and noted the confusion. Patient was initially thought to have had a CVA. Head CT as well as MRI were both negative. All initial evaluations negative except increase PCO2 from her baseline level. Patient initiated on BiPAP in the ER but did not tolerate it well. She is currently sedated. She has no prior history of dementia. She appears to be delirious at the moment.  08/08/2017: Patient seen and examined at bedside she is somnolent but she arouses to voices.  Not using BiPAP in the room.  Hypercarbic on presentation.  Daughter in the room reports unusual appearance and requests neurology consult.  CT head/MRI brain unremarkable for any acute intracranial findings.  Urinalysis, TSH, lab studies unremarkable.  Repeated ABG this morning.  pH 7.38, PCO2 58, PO2 93.  08/09/2017: Patient is more alert today.  She is alert and oriented x3.  Uncontrolled hypertension.  Started on amlodipine.   Assessment/Plan: Active Problems:   Depression with anxiety   Chronic pain syndrome   Coronary artery disease involving native coronary artery of native heart with angina pectoris (HCC)   Dyslipidemia, goal LDL below 70   Altered mental state  Acute metabolic encephalopathy, improving Most likely secondary to hypercarbia vs others CT head with no contrast MRI brain unremarkable for any intracranial findings Personally reviewed imagings with no acute intracranial findings. Suspect component of delirium from underlying dementia Ammonia level normal  Acute  hypercarbic hypoxic respiratory failure In the setting of OSA and noncompliance with CPAP  Chronic pain syndrome  Hold off narcotics and sedating agents  Uncontrolled hypertension Start amlodipine Continue home medications  OSA with noncompliance with CPAP Needs education/reinforcement  Coronary artery disease, stable No chest pain reported Continue current medications Plavix and statin  Chronic depression/anxiety, stable Hold off benzodiazepine  C/w Symbyax  GERD, stable Continue PPI  Risks: Moderate due to uncontrolled hypertension, multiple comorbidities and advanced age.   Code Status: Full code  Family Communication: Daughter at bedside  Disposition Plan: Home/SNF when clinically stable   Consultants:  Neurology  Procedures:  None  Antimicrobials:  None  DVT prophylaxis: Lovenox subcu   Objective: Vitals:   08/08/17 1233 08/08/17 1403 08/08/17 2110 08/09/17 0556  BP: (!) 158/75 (!) 165/98 124/72 (!) 173/94  Pulse: 74 61 75 64  Resp:  18 20 20   Temp:   97.6 F (36.4 C) 97.6 F (36.4 C)  TempSrc:    Oral  SpO2: 100% (!) 85% 96% 100%    Intake/Output Summary (Last 24 hours) at 08/09/2017 1318 Last data filed at 08/09/2017 1100 Gross per 24 hour  Intake -  Output 753 ml  Net -753 ml   There were no vitals filed for this visit.  Exam:  . General: 84 y.o. year-old female well-developed well-nourished in no acute distress.  Alert and oriented x3.   . Cardiovascular: Regular rate and rhythm with no rubs or gallops.  No thyromegaly or JVD noted. Marland Kitchen Respiratory: Clear to auscultation with no wheezes or rales. Good inspiratory effort. . Abdomen: Soft nontender nondistended with normal bowel sounds x4 quadrants. Marland Kitchen  Musculoskeletal: No lower extremity edema. 2/4 pulses in all 4 extremities. . Skin: No ulcerative lesions noted or rashes, . Psychiatry: Mood is appropriate for condition and setting   Data Reviewed: CBC: Recent Labs  Lab  08/07/17 1339 08/07/17 1434 08/07/17 2349 08/08/17 0732  WBC 7.2  --  8.9 9.9  NEUTROABS 4.7  --   --   --   HGB 12.2 13.3 11.7* 11.5*  HCT 41.1 39.0 38.6 38.1  MCV 97.4  --  99.2 97.9  PLT 268  --  239 213   Basic Metabolic Panel: Recent Labs  Lab 08/07/17 1339 08/07/17 1434 08/07/17 2349 08/08/17 0732  NA 140 140  --  142  K 4.5 4.6  --  4.2  CL 98* 96*  --  98*  CO2 35*  --   --  32  GLUCOSE 121* 121*  --  92  BUN 8 10  --  7  CREATININE 0.66 0.70 0.69 0.77  CALCIUM 9.4  --   --  9.4   GFR: CrCl cannot be calculated (Unknown ideal weight.). Liver Function Tests: Recent Labs  Lab 08/07/17 1339 08/08/17 0732  AST 27 20  ALT 14 13*  ALKPHOS 70 62  BILITOT 0.3 0.7  PROT 6.5 6.0*  ALBUMIN 3.5 3.2*   No results for input(s): LIPASE, AMYLASE in the last 168 hours. Recent Labs  Lab 08/08/17 1338  AMMONIA 12   Coagulation Profile: Recent Labs  Lab 08/07/17 1339  INR 0.99   Cardiac Enzymes: Recent Labs  Lab 08/08/17 0101 08/08/17 0732 08/08/17 1339  TROPONINI <0.03 <0.03 <0.03   BNP (last 3 results) No results for input(s): PROBNP in the last 8760 hours. HbA1C: No results for input(s): HGBA1C in the last 72 hours. CBG: Recent Labs  Lab 08/07/17 2155  GLUCAP 149*   Lipid Profile: No results for input(s): CHOL, HDL, LDLCALC, TRIG, CHOLHDL, LDLDIRECT in the last 72 hours. Thyroid Function Tests: Recent Labs    08/08/17 0732  TSH 2.533   Anemia Panel: No results for input(s): VITAMINB12, FOLATE, FERRITIN, TIBC, IRON, RETICCTPCT in the last 72 hours. Urine analysis:    Component Value Date/Time   COLORURINE YELLOW 08/07/2017 1531   APPEARANCEUR HAZY (A) 08/07/2017 1531   LABSPEC 1.018 08/07/2017 1531   PHURINE 6.0 08/07/2017 1531   GLUCOSEU NEGATIVE 08/07/2017 1531   HGBUR NEGATIVE 08/07/2017 1531   BILIRUBINUR NEGATIVE 08/07/2017 1531   KETONESUR NEGATIVE 08/07/2017 1531   PROTEINUR NEGATIVE 08/07/2017 1531   UROBILINOGEN 0.2  05/27/2014 1122   NITRITE NEGATIVE 08/07/2017 1531   LEUKOCYTESUR NEGATIVE 08/07/2017 1531   Sepsis Labs: @LABRCNTIP (procalcitonin:4,lacticidven:4)  )No results found for this or any previous visit (from the past 240 hour(s)).    Studies: No results found.  Scheduled Meds: . amLODipine  5 mg Oral Daily  . atorvastatin  40 mg Oral QHS  . calcium-vitamin D  1 tablet Oral QHS  . clopidogrel  75 mg Oral QHS  . docusate sodium  200 mg Oral BID  . dorzolamide-timolol  1 drop Both Eyes BID  . enoxaparin (LOVENOX) injection  40 mg Subcutaneous Daily  . furosemide  10 mg Oral Daily  . latanoprost  1 drop Both Eyes QHS  . metoprolol tartrate  25 mg Oral BID  . Netarsudil Dimesylate  1 drop Left Eye QHS  . OLANZapine-FLUoxetine  1 capsule Oral QHS  . pantoprazole  40 mg Oral Daily  . polyvinyl alcohol  1 drop Both Eyes TID AC &  HS  . pramipexole  0.5 mg Oral QHS,MR X 1  . prednisoLONE acetate  1 drop Right Eye TID AC & HS    Continuous Infusions:    LOS: 2 days     Darlin Drop, MD [facility] Pager [phone]  If 7PM-7AM, please contact night-coverage [email or web address] Password [email or web address] 08/09/2017, 1:18 PM

## 2017-08-09 NOTE — Progress Notes (Signed)
Subjective: Much improved  Exam: Vitals:   08/08/17 2110 08/09/17 0556  BP: 124/72 (!) 173/94  Pulse: 75 64  Resp: 20 20  Temp: 97.6 F (36.4 C) 97.6 F (36.4 C)  SpO2: 96% 100%   Gen: In bed, NAD Resp: non-labored breathing, no acute distress Abd: soft, nt  Neuro: MS: awake, alert, interactive CN:VFF, R exotropia, sluggish r pupil(post surgical) Motor: moves all extremities well Sensory:intact to LT  Impression: 84 yo F with AMS in the setting of severe hypercarbia and polypharmacy. I suspect her sedation yesterday was due to medication effect, in particular haldol. She has been on her current regimen for years and any modifications will likely need to be done slowly, I would not favor holding benzos any longer.   Recommendations: 1) Resume home medication 2) Consider titrating narcotics down as they can contribute to hypercarbia.  3) Other causes of hypercarbia per IM 4) Please call with any further questions or concerns.   Ritta Slot, MD [facility] [phone]  If 7pm- 7am, please page neurology on call as listed in AMION.

## 2017-08-09 NOTE — Clinical Social Work Note (Signed)
Clinical Social Work Assessment  Patient Details  Name: Alexis Yates MRN: [id number] Date of Birth: Jul 28, 1927  Date of referral:  08/09/17               Reason for consult:  Facility Placement                Permission sought to share information with:  Facility Medical sales representative, Family Supports Permission granted to share information::  Yes, Verbal Permission Granted  Name::     [place]  Agency::  SNFs  Relationship::  Daughter  Contact Information:  [phone]  Housing/Transportation Living arrangements for the past 2 months:  Single Family Home Source of Information:  Patient, Adult Children Patient Interpreter Needed:  None Criminal Activity/Legal Involvement Pertinent to Current Situation/Hospitalization:  No - Comment as needed Significant Relationships:  Adult Children Lives with:  Adult Children, Self Do you feel safe going back to the place where you live?  No Need for family participation in patient care:  Yes (Comment)  Care giving concerns:  CSW received consult for possible SNF placement at time of discharge. CSW spoke with patient's daughter at bedside regarding PT recommendation of SNF placement at time of discharge. Patient very lethargic. Patient's daugther reported that she is currently unable to care for patient at their home given patient's current physical needs and fall risk. Patient and daughter expressed understanding of PT recommendation and is agreeable to SNF placement at time of discharge. CSW to continue to follow and assist with discharge planning needs.   Social Worker assessment / plan:  CSW spoke with patient's daughter concerning possibility of rehab at [facility] before returning home.  Employment status:  Retired Database administrator PT Recommendations:  Not assessed at this time Information / Referral to community resources:  Skilled Nursing Facility  Patient/Family's Response to care:  Patient's daughter recognizes  need for rehab before returning home and is agreeable to a SNF in [place]. Patient's daughter reported preference for [facility] or [facility]. She is aware that [facility] has no availability. Patient will require PTAR for transport.  Patient/Family's Understanding of and Emotional Response to Diagnosis, Current Treatment, and Prognosis:  Patient/family is realistic regarding therapy needs and expressed being hopeful for SNF placement. Patient's daughter expressed understanding of CSW role and discharge process as well as medical condition. No questions/concerns about plan or treatment.    Emotional Assessment Appearance:  Appears stated age Attitude/Demeanor/Rapport:  Sedated Affect (typically observed):  Accepting Orientation:  Oriented to Self, Oriented to  Time, Oriented to Place Alcohol / Substance use:  Not Applicable Psych involvement (Current and /or in the community):  No (Comment)  Discharge Needs  Concerns to be addressed:  Care Coordination Readmission within the last 30 days:  No Current discharge risk:  None Barriers to Discharge:  Continued Medical Work up   [employer], LCSWA 08/09/2017, 10:47 AM

## 2017-08-09 NOTE — Progress Notes (Signed)
Patient BP 174/93.  Notified NP Donnamarie Poag on call .  Will continue to monitor the patient

## 2017-08-09 NOTE — Progress Notes (Signed)
CSW awaiting to see if [facility] can accept patient and begin insurance authorization.   Osborne Casco Nakiya Rallis LCSW [phone]

## 2017-08-09 NOTE — NC FL2 (Signed)
[facility] MEDICAID FL2 LEVEL OF CARE SCREENING TOOL     IDENTIFICATION  Patient Name: Alexis Yates Birthdate: 1927/12/07 Sex: female Admission Date (Current Location): 08/07/2017  [facility] [place] and [place] Number:  Producer, television/film/video and Address:  [facility] [place], [address] [place], [place] [address]      Provider Number: [id number]  Attending Physician Name and Address:  Darlin Drop, DO  Relative Name and Phone Number:  Chales Abrahams, daughter, [phone]    Current Level of Care: Hospital Recommended Level of Care: Skilled Nursing Facility Prior Approval Number:    Date Approved/Denied:   PASRR Number: [id number]  Discharge Plan: SNF    Current Diagnoses: Patient Active Problem List   Diagnosis Date Noted  . Altered mental state 08/07/2017  . Acute and chronic respiratory failure with hypoxia (HCC) 04/04/2017  . Acute hypoxemic respiratory failure (HCC) 04/02/2017  . OSA (obstructive sleep apnea) 12/12/2016  . Periodic limb movements of sleep 12/12/2016  . Acute respiratory failure with hypoxia and hypercapnia (HCC) 09/28/2016  . CHF (congestive heart failure) (HCC) 09/23/2016  . Dyspnea 09/23/2016  . Chronic diastolic CHF (congestive heart failure) (HCC) 09/23/2016  . Dyslipidemia, goal LDL below 70 08/04/2016  . DOE (dyspnea on exertion) 06/24/2016  . Dynamic left ventricular outflow obstruction 04/24/2016  . Coronary artery disease involving native coronary artery of native heart with angina pectoris (HCC) 04/24/2016  . History of MI (myocardial infarction) 04/12/2016  . Depression with anxiety 04/12/2016  . Chronic pain syndrome 04/12/2016  . Abdominal pain 03/07/2016  . Anemia 03/06/2016  . Other fatigue 03/06/2016  . Elevated transaminase level 03/06/2016  . Chronic pain   . Diverticulitis 02/29/2016  . ST elevation myocardial infarction (STEMI) of inferior wall, subsequent episode of care (HCC) 01/09/2016  . NSTEMI (non-ST  elevated myocardial infarction) (HCC) 01/08/2016  . PID (acute pelvic inflammatory disease) 05/27/2014  . Vaginal inflammation from pessary (HCC) 05/27/2014  . Hypertension   . Hiatal hernia   . Lung nodules   . Glaucoma   . GERD (gastroesophageal reflux disease)     Orientation RESPIRATION BLADDER Height & Weight     Self, Time, Place  O2(Nasal cannula 2.5L) Incontinent Weight:   Height:     BEHAVIORAL SYMPTOMS/MOOD NEUROLOGICAL BOWEL NUTRITION STATUS      Continent Diet(Please see DC Summary)  AMBULATORY STATUS COMMUNICATION OF NEEDS Skin   Limited Assist Verbally Normal                       Personal Care Assistance Level of Assistance  Bathing, Feeding, Dressing Bathing Assistance: Limited assistance Feeding assistance: Limited assistance Dressing Assistance: Limited assistance     Functional Limitations Info  Sight, Hearing, Speech Sight Info: Impaired Hearing Info: Adequate Speech Info: Adequate    SPECIAL CARE FACTORS FREQUENCY  PT (By licensed PT), OT (By licensed OT)     PT Frequency: 5x/week OT Frequency: 3x/week            Contractures      Additional Factors Info  Code Status, Allergies Code Status Info: Full Allergies Info: Penicillins, Phenergan Promethazine Hcl           Current Medications (08/09/2017):  This is the current hospital active medication list Current Facility-Administered Medications  Medication Dose Route Frequency Provider Last Rate Last Dose  . atorvastatin (LIPITOR) tablet 40 mg  40 mg Oral QHS Rometta Emery, MD   40 mg at 08/08/17 2213  .  calcium-vitamin D (OSCAL WITH D) 500-200 MG-UNIT per tablet 1 tablet  1 tablet Oral QHS Rometta Emery, MD   1 tablet at 08/08/17 2212  . clopidogrel (PLAVIX) tablet 75 mg  75 mg Oral QHS Rometta Emery, MD   75 mg at 08/08/17 2213  . docusate sodium (COLACE) capsule 200 mg  200 mg Oral BID Earlie Lou L, MD   200 mg at 08/09/17 0839  . dorzolamide-timolol (COSOPT)  22.3-6.8 MG/ML ophthalmic solution 1 drop  1 drop Both Eyes BID Rometta Emery, MD   1 drop at 08/09/17 0834  . enoxaparin (LOVENOX) injection 40 mg  40 mg Subcutaneous Daily Earlie Lou L, MD   40 mg at 08/09/17 0841  . furosemide (LASIX) tablet 10 mg  10 mg Oral Daily Rometta Emery, MD   10 mg at 08/09/17 0840  . latanoprost (XALATAN) 0.005 % ophthalmic solution 1 drop  1 drop Both Eyes QHS Rometta Emery, MD   1 drop at 08/08/17 2216  . metoprolol tartrate (LOPRESSOR) tablet 25 mg  25 mg Oral BID Rometta Emery, MD   25 mg at 08/09/17 0839  . Netarsudil Dimesylate 0.02 % SOLN 1 drop  1 drop Left Eye QHS Garba, Mohammad L, MD      . OLANZapine-FLUoxetine (SYMBYAX) 3-25 MG per capsule 1 capsule  1 capsule Oral QHS Rometta Emery, MD   1 capsule at 08/08/17 2213  . ondansetron (ZOFRAN) tablet 4 mg  4 mg Oral Q6H PRN Rometta Emery, MD       Or  . ondansetron (ZOFRAN) injection 4 mg  4 mg Intravenous Q6H PRN Earlie Lou L, MD      . pantoprazole (PROTONIX) EC tablet 40 mg  40 mg Oral Daily Earlie Lou L, MD   40 mg at 08/09/17 0840  . polyvinyl alcohol (LIQUIFILM TEARS) 1.4 % ophthalmic solution 1 drop  1 drop Both Eyes TID AC & HS Rometta Emery, MD   1 drop at 08/09/17 0834  . pramipexole (MIRAPEX) tablet 0.5 mg  0.5 mg Oral QHS,MR X 1 Garba, Mohammad L, MD   0.5 mg at 08/08/17 2213  . prednisoLONE acetate (PRED FORTE) 1 % ophthalmic suspension 1 drop  1 drop Right Eye TID AC & HS Rometta Emery, MD   1 drop at 08/09/17 0841  . QUEtiapine (SEROQUEL) tablet 25 mg  25 mg Oral QHS PRN Darlin Drop, DO         Discharge Medications: Please see discharge summary for a list of discharge medications.  Relevant Imaging Results:  Relevant Lab Results:   Additional Information SSN: [id number]  Mearl Latin, LCSWA

## 2017-08-09 NOTE — Progress Notes (Signed)
Pt complaining of pain 5/10 in knees, feet, and back.  No PRN's ordered.  Paged [facility] provider Donnamarie Poag, who ordered Tramadol 50mg  PO one time dose.  Will continue to monitor.

## 2017-08-09 NOTE — Evaluation (Signed)
Physical Therapy Evaluation Patient Details Name: Alexis Yates MRN: [id number] DOB: [birth year] Today's Date: 08/09/2017   History of Present Illness  84 y.o. female with medical history significant of chronic respiratory failure due to COPD with CO2 retention, obstructive sleep apnea with patient refusing to use CPAP, history of non-ST elevation MI, hypertension who presented with altered mental status. Head CT as well as MRI were both negative.   Clinical Impression   PTA pt living alone in her home with close to 24 hr support, pt independent in household level ambulation with Rollator and utilizes transport chair for appointment and community level mobility. Pt requires assist with bathing and dressing. At time of evaluation, pt oriented x 4 and appropriate. Pt limited in safe mobility by R knee pain which increases with distance of ambulation and decreased strength. Pt currently min guard for transfers and ambulation of 50 feet with RW. Pt requires assist for oxygen tank management. If pt has 24 hour assistance, PT recommends HHPT at discharge. PT will follow acutely to work towards goals.    Follow Up Recommendations Home health PT;Supervision/Assistance - 24 hour    Equipment Recommendations  None recommended by PT    Recommendations for Other Services       Precautions / Restrictions Precautions Precautions: Fall Restrictions Weight Bearing Restrictions: No      Mobility  Bed Mobility               General bed mobility comments: OOB in recliner  Transfers Overall transfer level: Needs assistance Equipment used: Rolling walker (2 wheeled) Transfers: Sit to/from Stand Sit to Stand: Min guard         General transfer comment: min guard for safety, able to steady herself in RW   Ambulation/Gait Ambulation/Gait assistance: Min guard Ambulation Distance (Feet): 50 Feet Assistive device: Rolling walker (2 wheeled) Gait Pattern/deviations: Step-through  pattern;Decreased weight shift to right;Decreased stance time - right;Decreased step length - left;Trunk flexed;Antalgic Gait velocity: slowed Gait velocity interpretation: <1.8 ft/sec, indicate of risk for recurrent falls General Gait Details: min guard for safety, limited in distance by increase in R knee pain, reliance on BUE and L LE for reducing R LE weightbearing as ambulation progressed      Balance Overall balance assessment: Needs assistance Sitting-balance support: Feet supported;No upper extremity supported Sitting balance-Leahy Scale: Fair     Standing balance support: Bilateral upper extremity supported Standing balance-Leahy Scale: Poor Standing balance comment: requires RW support                             Pertinent Vitals/Pain Pain Assessment: 0-10 Pain Score: 4  Pain Location: R knee Pain Descriptors / Indicators: Aching;Guarding;Sore;Throbbing Pain Intervention(s): Limited activity within patient's tolerance;Monitored during session;Repositioned    Home Living Family/patient expects to be discharged to:: Private residence Living Arrangements: Alone Available Help at Discharge: Family;Personal care attendant(almost 24 hr/day) Type of Home: House Home Access: Level entry     Home Layout: One level Home Equipment: Grab bars - tub/shower;Tub bench;Walker - 4 wheels;Bedside commode;Hand held shower head;Transport chair      Prior Function Level of Independence: Needs assistance   Gait / Transfers Assistance Needed: ambulates with Rollator in house, transport chair for appointments   ADL's / Homemaking Assistance Needed: assist with ADLs, iADLs         Hand Dominance   Dominant Hand: Right    Extremity/Trunk Assessment   Upper Extremity Assessment Upper Extremity  Assessment: Generalized weakness    Lower Extremity Assessment Lower Extremity Assessment: RLE deficits/detail;LLE deficits/detail RLE Deficits / Details: ROM in hip and  ankle WFL, knee lacks full extension, flexion, from prior injury, strength grossly assessed in sitting,  hip and ankle 3/5, knee grossly 2+/5,  RLE Sensation: WNL RLE Coordination: decreased fine motor LLE Deficits / Details: ROM WFL, strength grossly 4/5 LLE Sensation: WNL LLE Coordination: decreased fine motor    Cervical / Trunk Assessment Cervical / Trunk Assessment: Kyphotic  Communication   Communication: No difficulties  Cognition Arousal/Alertness: Awake/alert Behavior During Therapy: WFL for tasks assessed/performed Overall Cognitive Status: History of cognitive impairments - at baseline                                 General Comments: altered metal status resolved, short term memory problems      General Comments General comments (skin integrity, edema, etc.): Pt on 2.5 L O2 via nasal cannula , at rest SaO2 96%O2, HR 82, with ambulation SaO2 92%O2, max HR 99bpm     Exercises     Assessment/Plan    PT Assessment Patient needs continued PT services  PT Problem List Decreased strength;Decreased range of motion;Decreased activity tolerance;Decreased balance;Decreased mobility;Pain       PT Treatment Interventions DME instruction;Gait training;Functional mobility training;Therapeutic activities;Therapeutic exercise;Balance training;Cognitive remediation;Patient/family education    PT Goals (Current goals can be found in the Care Plan section)  Acute Rehab PT Goals Patient Stated Goal: have stomach feel better PT Goal Formulation: With patient Time For Goal Achievement: 08/23/17 Potential to Achieve Goals: Fair    Frequency Min 3X/week   Barriers to discharge        Co-evaluation               AM-PAC PT "6 Clicks" Daily Activity  Outcome Measure Difficulty turning over in bed (including adjusting bedclothes, sheets and blankets)?: A Little Difficulty moving from lying on back to sitting on the side of the bed? : A Little Difficulty  sitting down on and standing up from a chair with arms (e.g., wheelchair, bedside commode, etc,.)?: Unable Help needed moving to and from a bed to chair (including a wheelchair)?: A Little Help needed walking in hospital room?: A Little Help needed climbing 3-5 steps with a railing? : A Lot 6 Click Score: 15    End of Session Equipment Utilized During Treatment: Gait belt;Oxygen Activity Tolerance: Patient limited by pain Patient left: in chair;with call bell/phone within reach;with chair alarm set Nurse Communication: Mobility status PT Visit Diagnosis: Other abnormalities of gait and mobility (R26.89);Muscle weakness (generalized) (M62.81);Difficulty in walking, not elsewhere classified (R26.2);Pain Pain - Right/Left: Right Pain - part of body: Knee    Time: 3267-1245 PT Time Calculation (min) (ACUTE ONLY): 35 min   Charges:   PT Evaluation $PT Eval Moderate Complexity: 1 Mod PT Treatments $Gait Training: 8-22 mins   PT G Codes:        Alexis Yates B. Beverely Risen PT, DPT Acute Rehabilitation  [phone] Pager [phone]    Alexis Yates Fleet 08/09/2017, 11:16 AM

## 2017-08-10 DIAGNOSIS — Z515 Encounter for palliative care: Secondary | ICD-10-CM

## 2017-08-10 DIAGNOSIS — Z7189 Other specified counseling: Secondary | ICD-10-CM

## 2017-08-10 DIAGNOSIS — R410 Disorientation, unspecified: Secondary | ICD-10-CM

## 2017-08-10 LAB — CBC
HCT: 36.8 % (ref 36.0–46.0)
Hemoglobin: 11.7 g/dL — ABNORMAL LOW (ref 12.0–15.0)
MCH: 30.3 pg (ref 26.0–34.0)
MCHC: 31.8 g/dL (ref 30.0–36.0)
MCV: 95.3 fL (ref 78.0–100.0)
PLATELETS: 243 10*3/uL (ref 150–400)
RBC: 3.86 MIL/uL — ABNORMAL LOW (ref 3.87–5.11)
RDW: 14.3 % (ref 11.5–15.5)
WBC: 6.9 10*3/uL (ref 4.0–10.5)

## 2017-08-10 LAB — BASIC METABOLIC PANEL
Anion gap: 10 (ref 5–15)
BUN: 7 mg/dL (ref 6–20)
CALCIUM: 9.2 mg/dL (ref 8.9–10.3)
CO2: 30 mmol/L (ref 22–32)
CREATININE: 0.64 mg/dL (ref 0.44–1.00)
Chloride: 100 mmol/L — ABNORMAL LOW (ref 101–111)
GFR calc Af Amer: >60 mL/min (ref >60)
GLUCOSE: 106 mg/dL — AB (ref 65–99)
POTASSIUM: 3.5 mmol/L (ref 3.5–5.1)
SODIUM: 140 mmol/L (ref 135–145)

## 2017-08-10 MED ORDER — OXYCODONE HCL 5 MG PO TABS
5.0000 mg | ORAL_TABLET | Freq: Two times a day (BID) | ORAL | Status: DC | PRN
  Administered 2017-08-10: 5 mg via ORAL
  Filled 2017-08-10: qty 1

## 2017-08-10 MED ORDER — OXYCODONE HCL 5 MG PO TABS
5.0000 mg | ORAL_TABLET | Freq: Once | ORAL | Status: AC
  Administered 2017-08-10: 5 mg via ORAL
  Filled 2017-08-10: qty 1

## 2017-08-10 NOTE — Discharge Summary (Signed)
Discharge Summary  Alexis Yates ZOX:[id number] DOB: 08/20/1927  PCP: Marden Noble, MD  Admit date: 08/07/2017 Discharge date: 08/10/2017  Time spent: , more than 50% time spent on coordination of care  Recommendations for Outpatient Follow-up:  1. F/u with PMD within a week  for hospital discharge follow up, repeat cbc/bmp at follow up 2. Home hospice  Discharge Diagnoses:  Active Hospital Problems   Diagnosis Date Noted  . Altered mental state 08/07/2017  . Dyslipidemia, goal LDL below 70 08/04/2016  . Coronary artery disease involving native coronary artery of native heart with angina pectoris (HCC) 04/24/2016  . Depression with anxiety 04/12/2016  . Chronic pain syndrome 04/12/2016    Resolved Hospital Problems  No resolved problems to display.    Discharge Condition: stable  Diet recommendation: regular diet  There were no vitals filed for this visit.  History of present illness: (per admitting MD Dr Mikeal Hawthorne) PCP: Marden Noble, MD   Outpatient Specialists: Dr Coralyn Helling   Patient coming from: Home  Chief Complaint: altered mental status  HPI: Alexis Yates is a 84 y.o. female with medical history significant of chronic respiratory failure due to COPD with CO2 retention, obstructive sleep apnea with patient refusing to use CPAP, history of non-ST elevation MI, hypertension who presented today with altered mental status. She has 2 daughters that live with her and noted the confusion. Patient was initially thought to have had a CVA. Head CT as well as MRI were both negative. All initial evaluations negative except increase PCO2 from her baseline level. Patient initiated on BiPAP in the ER but did not tolerate it well. She is currently sedated. She has no prior history of dementia. She appears to be delirious at the moment.  ED Course: vitals were stable except for blood pressure 179/100. Her ABG showed a pH of 7.335 PCO2 68.3 and PO2 of 95. White count is  8.9 with hemoglobin 11.7. Sodium 140 potassium 4.6 creatinine 0.69. Glucose is 121. Lactic acid is within normal. Head CT without contrast is negative and MRI of the brain also negative for anything acute.     Hospital Course:  Active Problems:   Depression with anxiety   Chronic pain syndrome   Coronary artery disease involving native coronary artery of native heart with angina pectoris (HCC)   Dyslipidemia, goal LDL below 70   Altered mental state  Acute metabolic encephalopathy/ Acute hypercarbic hypoxic respiratory failure -In the setting of OSA and noncompliance with CPAP , narcotic /sedating meds could also contribute, resolved -CT head with no contrast, MRI brain unremarkable for any intracranial findings -ammonia level normal   Chronic pain syndrome  Hold off narcotics and sedating agents due to encephalopathy on admission Resuming prn oxycodone, continue hold long acting ms contin, follow up with pmd.  Uncontrolled hypertension Due to breakthrough pain? bp improved with resuming prn opioids. Continue home medications lopressor  OSA with noncompliance with CPAP Needs education/reinforcement  Coronary artery disease, stable No chest pain reported Continue current medications Plavix and statin  Chronic depression/anxiety, stable Home meds benzodiazepine and opioids held in the hospital  Prn benzo resumed at discharge,  C/w Symbyax  GERD, stable Continue PPI  FTT, on home hospice, resume home hospice.  Code Status: Full code, confirmed   Family Communication: Daughter at bedside  Disposition Plan: Home with home hospice   Consultants:  Neurology  Palliative care  Procedures:  None  Antimicrobials:  None   Discharge Exam: BP (!) 155/100  Pulse 77   Temp 97.6 F (36.4 C)   Resp 18   SpO2 100%   General: frail, chronically ill appearing, oriented x3 today Cardiovascular: RRR Respiratory: diminished at basis, no wheezing,  no rales, no rhonchi  Discharge Instructions You were cared for by a hospitalist during your hospital stay. If you have any questions about your discharge medications or the care you received while you were in the hospital after you are discharged, you can call the unit and asked to speak with the hospitalist on call if the hospitalist that took care of you is not available. Once you are discharged, your primary care physician will handle any further medical issues. Please note that NO REFILLS for any discharge medications will be authorized once you are discharged, as it is imperative that you return to your primary care physician (or establish a relationship with a primary care physician if you do not have one) for your aftercare needs so that they can reassess your need for medications and monitor your lab values.  Discharge Instructions    Diet general   Complete by:  As directed    Increase activity slowly   Complete by:  As directed      Allergies as of 08/10/2017      Reactions   Penicillins Anaphylaxis   Has patient had a PCN reaction causing immediate rash, facial/tongue/throat swelling, SOB or lightheadedness with hypotension: Yes Has patient had a PCN reaction causing severe rash involving mucus membranes or skin necrosis: No Has patient had a PCN reaction that required hospitalization Yes Has patient had a PCN reaction occurring within the last 10 years: No If all of the above answers are "NO", then may proceed with Cephalosporin use.   Phenergan [promethazine Hcl] Other (See Comments)   Restless legs      Medication List    STOP taking these medications   docusate sodium 250 MG capsule Commonly known as:  COLACE   morphine 15 MG 12 hr tablet Commonly known as:  MS CONTIN     TAKE these medications   ALPRAZolam 0.25 MG tablet Commonly known as:  XANAX Take 0.25-0.5 mg by mouth at bedtime as needed for anxiety or sleep.   atorvastatin 40 MG tablet Commonly known as:   LIPITOR Take 40 mg by mouth at bedtime.   bimatoprost 0.01 % Soln Commonly known as:  LUMIGAN Place 1 drop into both eyes at bedtime.   CALCIUM-D PO Take 1 tablet by mouth at bedtime.   clopidogrel 75 MG tablet Commonly known as:  PLAVIX Take 1 tablet (75 mg total) by mouth daily. What changed:  when to take this   dorzolamide-timolol 22.3-6.8 MG/ML ophthalmic solution Commonly known as:  COSOPT Place 1 drop into both eyes 2 (two) times daily.   esomeprazole 40 MG capsule Commonly known as:  NEXIUM Take 40 mg by mouth daily.   furosemide 20 MG tablet Commonly known as:  LASIX Take 0.5 tablets (10 mg total) by mouth daily.   guaiFENesin 600 MG 12 hr tablet Commonly known as:  MUCINEX Take 1 tablet (600 mg total) by mouth 2 (two) times daily.   ipratropium-albuterol 0.5-2.5 (3) MG/3ML Soln Commonly known as:  DUONEB Take 3 mLs by nebulization every 6 (six) hours as needed.   lidocaine 5 % Commonly known as:  LIDODERM Place 2 patches onto the skin daily. Remove & Discard patch within 12 hours or as directed by MD   methocarbamol 500 MG tablet Commonly known  as:  ROBAXIN Take 1 tablet (500 mg total) by mouth 3 (three) times daily.   metoprolol tartrate 25 MG tablet Commonly known as:  LOPRESSOR Take 25 mg by mouth 2 (two) times daily.   OLANZapine-FLUoxetine 3-25 MG capsule Commonly known as:  SYMBYAX Take 1 capsule by mouth at bedtime.   oxyCODONE 5 MG immediate release tablet Commonly known as:  Oxy IR/ROXICODONE Take 1 tablet (5 mg total) by mouth 3 (three) times daily as needed for severe pain. What changed:    when to take this  reasons to take this   OXYGEN Inhale 2 L into the lungs See admin instructions. Use nightly while sleeping, may also uses during the day as needed for shortness of breath   pramipexole 0.5 MG tablet Commonly known as:  MIRAPEX Take 0.5 mg by mouth See admin instructions. Take one tablet (0.5 mg) by mouth daily at bedtime, may  take another dose one hour later as needed for restless legs   prednisoLONE acetate 1 % ophthalmic suspension Commonly known as:  PRED FORTE Place 1 drop into the right eye 4 (four) times daily.   RHOPRESSA 0.02 % Soln Generic drug:  Netarsudil Dimesylate Place 1 drop into the left eye at bedtime.   SYSTANE 0.4-0.3 % Soln Generic drug:  Polyethyl Glycol-Propyl Glycol Place 1 drop into both eyes 4 (four) times daily.      Allergies  Allergen Reactions  . Penicillins Anaphylaxis    Has patient had a PCN reaction causing immediate rash, facial/tongue/throat swelling, SOB or lightheadedness with hypotension: Yes Has patient had a PCN reaction causing severe rash involving mucus membranes or skin necrosis: No Has patient had a PCN reaction that required hospitalization Yes Has patient had a PCN reaction occurring within the last 10 years: No If all of the above answers are "NO", then may proceed with Cephalosporin use.   Marland Kitchen Phenergan [Promethazine Hcl] Other (See Comments)    Restless legs   Follow-up Information    Marden Noble, MD Follow up in 1 week(s).   Specialty:  Internal Medicine Why:  hospital discharge follow up. Contact information: [address] [place] [address] [phone]            The results of significant diagnostics from this hospitalization (including imaging, microbiology, ancillary and laboratory) are listed below for reference.    Significant Diagnostic Studies: Dg Chest 2 View  Result Date: 08/07/2017 CLINICAL DATA:  Shortness of breath. EXAM: CHEST - 2 VIEW COMPARISON:  Radiographs of April 22, 2017. FINDINGS: The heart size and mediastinal contours are within normal limits. No pneumothorax or pleural effusion is noted. Lungs are clear. Status post right shoulder arthroplasty. Severe degenerative changes seen involving the left glenohumeral joint. Thoracic kyphoscoliosis is noted. IMPRESSION: No active cardiopulmonary  disease. Electronically Signed   By: Lupita Raider, M.D.   On: 08/07/2017 14:14   Ct Head Wo Contrast  Result Date: 08/07/2017 CLINICAL DATA:  Altered mental status. EXAM: CT HEAD WITHOUT CONTRAST TECHNIQUE: Contiguous axial images were obtained from the base of the skull through the vertex without intravenous contrast. COMPARISON:  None. FINDINGS: Brain: Diffusely enlarged ventricles and subarachnoid spaces. Patchy white matter low density in both cerebral hemispheres. No intracranial hemorrhage, mass lesion or CT evidence of acute infarction. Vascular: No hyperdense vessel or unexpected calcification. Skull: Normal. Negative for fracture or focal lesion. Sinuses/Orbits: Normally pneumatized paranasal sinuses. Status post bilateral cataract extraction. Other: None. IMPRESSION: 1. No acute abnormality. 2.  Stable mild to moderate diffuse cerebral atrophy and minimal chronic small vessel white matter ischemic changes in both cerebral hemispheres. Electronically Signed   By: Beckie Salts M.D.   On: 08/07/2017 14:49   Mr Brain Wo Contrast  Result Date: 08/07/2017 CLINICAL DATA:  Altered level of consciousness, unexplained, possible hemianopia. EXAM: MRI HEAD WITHOUT CONTRAST TECHNIQUE: Multiplanar, multiecho pulse sequences of the brain and surrounding structures were obtained without intravenous contrast. COMPARISON:  CT head earlier today. FINDINGS: The patient was unable to remain motionless for the exam. Small or subtle lesions could be overlooked. Brain: No acute infarction, hemorrhage, hydrocephalus, extra-axial collection or mass lesion. Generalized atrophy. Mild chronic microvascular ischemic change. Falx ossification. Vascular: Normal flow voids. Skull and upper cervical spine: Grossly negative. Sinuses/Orbits: No sinus or mastoid disease.  Negative orbits. Other: None. IMPRESSION: Motion degraded exam. Atrophy and relatively mild changes of small vessel disease. No acute intracranial findings.  Electronically Signed   By: Elsie Stain M.D.   On: 08/07/2017 18:26    Microbiology: No results found for this or any previous visit (from the past 240 hour(s)).   Labs: Basic Metabolic Panel: Recent Labs  Lab 08/07/17 1339 08/07/17 1434 08/07/17 2349 08/08/17 0732 08/10/17 0406  NA 140 140  --  142 140  K 4.5 4.6  --  4.2 3.5  CL 98* 96*  --  98* 100*  CO2 35*  --   --  32 30  GLUCOSE 121* 121*  --  92 106*  BUN 8 10  --  7 7  CREATININE 0.66 0.70 0.69 0.77 0.64  CALCIUM 9.4  --   --  9.4 9.2   Liver Function Tests: Recent Labs  Lab 08/07/17 1339 08/08/17 0732  AST 27 20  ALT 14 13*  ALKPHOS 70 62  BILITOT 0.3 0.7  PROT 6.5 6.0*  ALBUMIN 3.5 3.2*   No results for input(s): LIPASE, AMYLASE in the last 168 hours. Recent Labs  Lab 08/08/17 1338  AMMONIA 12   CBC: Recent Labs  Lab 08/07/17 1339 08/07/17 1434 08/07/17 2349 08/08/17 0732 08/10/17 0406  WBC 7.2  --  8.9 9.9 6.9  NEUTROABS 4.7  --   --   --   --   HGB 12.2 13.3 11.7* 11.5* 11.7*  HCT 41.1 39.0 38.6 38.1 36.8  MCV 97.4  --  99.2 97.9 95.3  PLT 268  --  239 213 243   Cardiac Enzymes: Recent Labs  Lab 08/08/17 0101 08/08/17 0732 08/08/17 1339  TROPONINI <0.03 <0.03 <0.03   BNP: BNP (last 3 results) Recent Labs    09/22/16 2050 04/02/17 1539  BNP 398.8* 663.3*    ProBNP (last 3 results) No results for input(s): PROBNP in the last 8760 hours.  CBG: Recent Labs  Lab 08/07/17 2155  GLUCAP 149*       Signed:  Albertine Grates MD, PhD  [facility] 08/10/2017, 11:03 AM

## 2017-08-10 NOTE — Consult Note (Signed)
Consultation Note Date: 08/10/2017   Patient Name: Alexis Yates  DOB: [birth year]  MRN: [id number]  Age / Sex: 84 y.o., female  PCP: Alexis Huddle, MD Referring Physician: Florencia Reasons, MD  Reason for Consultation: Establishing goals of care and Pain control  HPI/Patient Profile: 84 y.o. female  with past medical history of COPD on home oxygen, MI, HTN, lung nodules, hiatal hernia, GERD, chronic pain, CAD, OSA refuses CPAP, and arthritis admitted on 08/07/2017 with altered mental status. Family thought patient was having a stroke. CT head and MRI negative for acute findings. CO2 elevated from baseline but patient did not tolerate BiPAP in ED. Other initial evaluations negative for acute findings. Neurology consulted and contributes AMS to severe hypercarbia and polypharmacy (on narcotics for chronic pain). Neurology recommends resuming home medications but titrating narcotics down due to hypercarbia. Patient currently receiving hospice services through Alexis Yates. Palliative medicine consultation for goals of care and symptom management.   Clinical Assessment and Goals of Care:  I have reviewed medical records, discussed with care team, and met with patient and daughter Alexis Yates) at bedside. Patient awake, alert, oriented and sitting comfortably in recliner. C/o of 6 out of 10 chronic back and knee pain. RN instructed to give oxycodone 63m PO x1.   I introduced Palliative Medicine as specialized medical care for people living with serious illness. It focuses on providing relief from the symptoms and stress of a serious illness. The goal is to improve quality of life for both the patient and the family.  We discussed a brief life review of the patient. Alexis Yates a middle sEducation officer, museum Prior to hospitalization, still living in her home but requiring 24 hour care. Daughter speaks of a decline in her health  since February 2019, including worsening COPD requiring 24/7 oxygen. [facility]. Alexis Yates of [place]. In the future, Alexis Muscatis hopeful to move her to assisted living.   Discussed hospital diagnoses, interventions, and underlying chronic, progressive nature of COPD. High risk for continued cycles of exacerbation related to hypercarbia. Alexis Muscatis relieved her mother did not have a stroke.    Discussed symptom management and narcotics contributing to altered mental status. Alexis Yates BID oxycodone has been restarted by attending. She states relief from prn oxycodone. At home, hospice prescribed MS Contin 7.565mBID per daughter. This has not been restarted. Will defer to hospice.   Advanced directives, concepts specific to code status, artifical feeding and hydration, and rehospitalization were considered and discussed. MOST form introduced. The patient has a documented living will with daughters Alexis Muscatnd Alexis Yates documented HCPOA's. Alexis Yates me she does not have a DNR in place. Educated on MOST form and recommendations against heroic measures with age, frailty, and underlying co-morbidities. I encouraged Alexis Yates and her daughter to review MOST form and consider completing with hospice provider in the  future.   Answered questions and concerns regarding hospice services and philosophy. Alexis Yates tells me her mother is being discharged home today by 4pm.    SUMMARY OF RECOMMENDATIONS    Patient currently a FULL code. Introduced and educated patient and daughter on MOST form. Encouraged them to consider her wishes regarding heroic measures at EOL.  Reviewed documented living will with [facility] paperwork in epic.   Plan is for discharge home today with continued hospice services.   Code Status/Advance Care Planning:  Full code  Symptom Management:    Continue oxycodone 35m PO BID prn  Oxycodone 555mPO x1 now  Will defer restarting MS Contin to hospice provider since patient is discharging today.  Agree with Colace 20056mO BID  Palliative Prophylaxis:   Aspiration, Bowel Regimen, Delirium Protocol, Frequent Pain Assessment and Oral Care  Psycho-social/Spiritual:   Desire for further Chaplaincy support:no  Additional Recommendations: Caregiving  Support/Resources and Education on Hospice  Prognosis:   Unable to determine: progressive COPD with underlying ? Dementia and declining functional status.   Discharge Planning: Home with Hospice      Primary Diagnoses: Present on Admission: . Altered mental state . Dyslipidemia, goal LDL below 70 . Depression with anxiety . Coronary artery disease involving native coronary artery of native heart with angina pectoris ([facility] Chronic pain syndrome   I have reviewed the medical record, interviewed the patient and family, and examined the patient. The following aspects are pertinent.  Past Medical History:  Diagnosis Date  . Arthritis   . Atherosclerosis of aorta ([place] . CAD (coronary artery disease)    a. 10/27 PCI with DES to RCA, diffuse nonobstructive disease, EF 50-55%  . Chronic pain   . Diverticulosis   . Dynamic left ventricular outflow obstruction    Mod focal basal LVH. EF 60-65% w/ dynamic LVOT obstruction (Peak Gradient 138 mmHg). No RWMA. Gr 2 DD - high filling pressures. Severe SAM. ~ PAP 35 mmHg.  . EMarland Kitchenevated transaminase level   . GERD (gastroesophageal reflux disease)   . Glaucoma   . Hiatal hernia   . Hypertension   . Lung nodules    Right  . MI (myocardial infarction) ([place] [place]/2017  . Scoliosis   . UTI (urinary tract infection)    refractory   Social History   Socioeconomic History  . Marital status: Widowed    Spouse name: Not on file  . Number of children: 2  . Years of education: Not on file  . Highest education level: Not on file   Occupational History  . Occupation: Retired from school system  Social Needs  . Financial resource strain: Not on file  . Food insecurity:    Worry: Not on file    Inability: Not on file  . Transportation needs:    Medical: Not on file    Non-medical: Not on file  Tobacco Use  . Smoking status: Former Smoker    Packs/day: 1.00    Years: 33.00    Pack years: 33.00    Types: Cigarettes    Last attempt to quit: 03/15/1981    Years since quitting: 36.4  . Smokeless tobacco: Never Used  Substance and Sexual Activity  . Alcohol use: No    Alcohol/week: 0.0 oz  . Drug use: No  . Sexual activity: Not on file  Lifestyle  . Physical activity:    Days per week: Not on file    Minutes per session: Not on file  .  Stress: Not on file  Relationships  . Social connections:    Talks on phone: Not on file    Gets together: Not on file    Attends religious service: Not on file    Active member of club or organization: Not on file    Attends meetings of clubs or organizations: Not on file    Relationship status: Not on file  Other Topics Concern  . Not on file  Social History Narrative   Widowed.  Lives alone.  Ambulates with a cane.   Family History  Problem Relation Age of Onset  . Cancer Brother        Stomach  . Heart disease Father        Died age 82  . Diabetes Mother   . Heart failure Mother        Died 74   Scheduled Meds: . amLODipine  5 mg Oral Daily  . atorvastatin  40 mg Oral QHS  . calcium-vitamin D  1 tablet Oral QHS  . clopidogrel  75 mg Oral QHS  . docusate sodium  200 mg Oral BID  . dorzolamide-timolol  1 drop Both Eyes BID  . enoxaparin (LOVENOX) injection  40 mg Subcutaneous Daily  . furosemide  10 mg Oral Daily  . latanoprost  1 drop Both Eyes QHS  . metoprolol tartrate  25 mg Oral BID  . OLANZapine-FLUoxetine  1 capsule Oral QHS  . pantoprazole  40 mg Oral Daily  . polyvinyl alcohol  1 drop Both Eyes TID AC & HS  . pramipexole  0.5 mg Oral QHS,MR X 1   . prednisoLONE acetate  1 drop Right Eye TID AC & HS   Continuous Infusions: PRN Meds:.ondansetron **OR** ondansetron (ZOFRAN) IV, oxyCODONE, QUEtiapine Medications Prior to Admission:  Prior to Admission medications   Medication Sig Start Date End Date Taking? Authorizing Provider  ALPRAZolam (XANAX) 0.25 MG tablet Take 0.25-0.5 mg by mouth at bedtime as needed for anxiety or sleep.    Yes [provider]  atorvastatin (LIPITOR) 40 MG tablet Take 40 mg by mouth at bedtime.  03/16/16  Yes [provider]  bimatoprost (LUMIGAN) 0.01 % SOLN Place 1 drop into both eyes at bedtime.    Yes [provider]  Calcium Carbonate-Vitamin D (CALCIUM-D PO) Take 1 tablet by mouth at bedtime.   Yes [provider]  clopidogrel (PLAVIX) 75 MG tablet Take 1 tablet (75 mg total) by mouth daily. Patient taking differently: Take 75 mg by mouth at bedtime.  01/14/16  Yes Reino Bellis B, NP  docusate sodium (COLACE) 250 MG capsule Take 250 mg by mouth 2 (two) times daily.    Yes [provider]  dorzolamide-timolol (COSOPT) 22.3-6.8 MG/ML ophthalmic solution Place 1 drop into both eyes 2 (two) times daily.   Yes [provider]  esomeprazole (NEXIUM) 40 MG capsule Take 40 mg by mouth daily. 03/19/16  Yes [provider]  furosemide (LASIX) 20 MG tablet Take 0.5 tablets (10 mg total) by mouth daily. 09/28/16  Yes Mariel Aloe, MD  metoprolol tartrate (LOPRESSOR) 25 MG tablet Take 25 mg by mouth 2 (two) times daily.   Yes [provider]  morphine (MS CONTIN) 15 MG 12 hr tablet Take 7.5 mg by mouth 2 (two) times daily.    Yes [provider]  Netarsudil Dimesylate (RHOPRESSA) 0.02 % SOLN Place 1 drop into the left eye at bedtime.   Yes [provider]  OLANZapine-FLUoxetine [facility])  3-25 MG capsule Take 1 capsule by mouth at bedtime.    Yes [provider]  oxyCODONE (OXY IR/ROXICODONE) 5 MG immediate release tablet  Take 1 tablet (5 mg total) by mouth 3 (three) times daily as needed for severe pain. Patient taking differently: Take 5 mg by mouth 2 (two) times daily as needed for breakthrough pain.  09/28/16  Yes Mariel Aloe, MD  OXYGEN Inhale 2 L into the lungs See admin instructions. Use nightly while sleeping, may also uses during the day as needed for shortness of breath   Yes [provider]  Polyethyl Glycol-Propyl Glycol (SYSTANE) 0.4-0.3 % SOLN Place 1 drop into both eyes 4 (four) times daily.    Yes [provider]  pramipexole (MIRAPEX) 0.5 MG tablet Take 0.5 mg by mouth See admin instructions. Take one tablet (0.5 mg) by mouth daily at bedtime, may take another dose one hour later as needed for restless legs   Yes [provider]  prednisoLONE acetate (PRED FORTE) 1 % ophthalmic suspension Place 1 drop into the right eye 4 (four) times daily.   Yes [provider]  guaiFENesin (MUCINEX) 600 MG 12 hr tablet Take 1 tablet (600 mg total) by mouth 2 (two) times daily. Patient not taking: Reported on 08/07/2017 04/05/17   Lavina Hamman, MD  ipratropium-albuterol (DUONEB) 0.5-2.5 (3) MG/3ML SOLN Take 3 mLs by nebulization every 6 (six) hours as needed. Patient not taking: Reported on 12/31/2016 09/28/16   Mariel Aloe, MD  lidocaine (LIDODERM) 5 % Place 2 patches onto the skin daily. Remove & Discard patch within 12 hours or as directed by MD Patient not taking: Reported on 08/07/2017 04/05/17   Lavina Hamman, MD  methocarbamol (ROBAXIN) 500 MG tablet Take 1 tablet (500 mg total) by mouth 3 (three) times daily. Patient not taking: Reported on 08/07/2017 04/05/17   Lavina Hamman, MD   Allergies  Allergen Reactions  . Penicillins Anaphylaxis    Has patient had a PCN reaction causing immediate rash, facial/tongue/throat swelling, SOB or lightheadedness with hypotension: Yes Has patient had a PCN reaction causing severe rash involving mucus membranes or skin necrosis:  No Has patient had a PCN reaction that required hospitalization Yes Has patient had a PCN reaction occurring within the last 10 years: No If all of the above answers are "NO", then may proceed with Cephalosporin use.   Marland Kitchen Phenergan [Promethazine Hcl] Other (See Comments)    Restless legs   Review of Systems  Constitutional: Positive for activity change.       Chronic pain  Respiratory: Positive for shortness of breath.   Neurological: Positive for weakness.   Physical Exam  Constitutional: She is oriented to person, place, and time.  HENT:  Head: Normocephalic and atraumatic.  Pulmonary/Chest: No accessory muscle usage. No tachypnea. No respiratory distress.  Neurological: She is alert and oriented to person, place, and time.  Skin: Skin is warm and dry.  Psychiatric: She has a normal mood and affect. Her speech is normal and behavior is normal.  Nursing note and vitals reviewed.  Vital Signs: BP (!) 155/100   Pulse 77   Temp 97.6 F (36.4 C)   Resp 18   SpO2 100%  Pain Scale: 0-10   Pain Score: Asleep  SpO2: SpO2: 100 % O2 Device:SpO2: 100 % O2 Flow Rate: .O2 Flow Rate (L/min): 3 L/min  IO: Intake/output summary:   Intake/Output Summary (Last 24 hours) at 08/10/2017 1334 Last data filed  at 08/10/2017 0900 Gross per 24 hour  Intake 200 ml  Output 3 ml  Net 197 ml    LBM: Last BM Date: 08/09/17 Baseline Weight:   Most recent weight:       Palliative Assessment/Data: PPS 50%     Time In: 1250 Time Out: 1340 Time Total: 50 min Greater than 50%  of this time was spent counseling and coordinating care related to the above assessment and plan.  Signed by:  Ihor Dow, FNP-C Palliative Medicine Team  Phone: [phone] Fax: [phone]  Please contact Palliative Medicine Team phone at [phone] for questions and concerns.  For individual provider: See Shea Evans

## 2017-08-10 NOTE — Progress Notes (Signed)
NCM received consult: Family plan to transition [place] [facility] to hospice agency in [place]. NCM spoke with daughter Chales Abrahams regarding ending hospice care in [place] and switching to hospice agency in [place]/[facility]. Daughter states pt will move to [place] the end of June @ which time she would like to begin hospice care for mom with agency in [place]. However, daughter wants to ensure there isn't any delay with mom maintaining oxygen therapy once change occurs. NCM informed daughter once referral made with [facility] they will ensure oxygen setup is processed to facilitate no delay with pt's oxygen therapy needs. NCM confirmed concern with Amy/ [facility] liaison. Gae Gallop RN,BSN,CM

## 2017-08-10 NOTE — Progress Notes (Signed)
Dortha Kern Leopard to be D/C'd home with hospice per MD order. Discussed with the patient and daughter and all questions fully answered. VVS, Skin clean and dry. Skin tear present to left arm with foam dressing in place. IV catheter discontinued intact. Site without signs and symptoms of complications. Dressing and pressure applied.  An After Visit Summary was printed and given to the patient.  Patient escorted via stretcher, and D/C home via PTAR.  Jon Gills  08/10/2017 8:14 PM

## 2017-08-10 NOTE — Care Management Important Message (Signed)
Important Message  Patient Details  Name: Alexis Yates MRN: [id number] Date of Birth: 1928/01/31   Medicare Important Message Given:  Yes    Niasha Devins 08/10/2017, 1:53 PM

## 2017-08-10 NOTE — Progress Notes (Signed)
CSW faxed DC Summary to [facility]). They will follow up with patient and daughter to determine how to transfer patient to [facility].   CSW signing off.  Osborne Casco Pailynn Vahey LCSW [phone]

## 2017-08-10 NOTE — Care Management Note (Signed)
Case Management Note  Patient Details  Name: CHARL HERROLD MRN: [id number] Date of Birth: 02-Dec-1927  Subjective/Objective:    Presents with AMS, hx ofchronic respiratory failure due to COPD with CO2 retention, obstructive sleep apnea / refusing to use CPAP,  non-ST elevation MI, hypertension. Pt receives hosp[facility] from [facility]. Resides with daughter.  Windell Hummingbird (Daughter) Salina April (Daughter)    [phone]     PCP: Marden Noble  Action/Plan: Transition to home with the resumption of home hospice services  With [facility].  PTAR services will need to be arranged for transportation to home with oxygen. Daughter states has MD appointment today and arrangements can be made ? By Gaston Islam   Expected Discharge Date:  08/10/17               Expected Discharge Plan:  Home w Hospice Care  In-House Referral:     Discharge planning Services  CM Consult  Post Acute Care Choice:  Resumption of Svcs/PTA Provider, Hospice(Home hospice with [facility]) Choice offered to:  Patient  DME Arranged:   N/A DME Agency:   N/A  HH Arranged:    N/A HH Agency:    N/A Status of Service:  Completed, signed off  If discussed at Long Length of Stay Meetings, dates discussed:    Additional Comments:  Epifanio Lesches, RN 08/10/2017, 12:36 PM

## 2017-08-10 NOTE — Progress Notes (Addendum)
Physical Therapy Treatment Patient Details Name: Alexis Yates MRN: [id number] DOB: 27-May-1927 Today's Date: 08/10/2017    History of Present Illness 84 y.o. female with medical history significant of chronic respiratory failure due to COPD with CO2 retention, obstructive sleep apnea with patient refusing to use CPAP, history of non-ST elevation MI, hypertension who presented with altered mental status. Head CT as well as MRI were both negative.    PT Comments    Pt much more alert today and willing to work with therapy. Pt limited in her safe mobility by oxygen desaturation (see General Comments) and R LE pain with ambulation. Pt supervision for bed mobility, min guard for transfers and ambulation of 80 feet with RW. Pt with 3/4 DoE by end of ambulation and reports walking has gotten more difficult lately. D/c plans continue to remain appropriate.       Follow Up Recommendations  Home health PT;Supervision/Assistance - 24 hour     Equipment Recommendations  None recommended by PT    Recommendations for Other Services       Precautions / Restrictions Precautions Precautions: Fall Restrictions Weight Bearing Restrictions: No    Mobility  Bed Mobility Overal bed mobility: Needs Assistance Bed Mobility: Supine to Sit     Supine to sit: Supervision     General bed mobility comments: supervision for safety, increased time and effort   Transfers Overall transfer level: Needs assistance Equipment used: Rolling walker (2 wheeled) Transfers: Sit to/from Stand Sit to Stand: Min guard         General transfer comment: min guard for safety, able to steady herself in RW   Ambulation/Gait Ambulation/Gait assistance: Min guard Ambulation Distance (Feet): 80 Feet Assistive device: Rolling walker (2 wheeled) Gait Pattern/deviations: Step-through pattern;Decreased weight shift to right;Decreased stance time - right;Decreased step length - left;Trunk flexed;Antalgic Gait  velocity: slowed Gait velocity interpretation: <1.8 ft/sec, indicate of risk for recurrent falls General Gait Details: min guard for safety, limited in distance by increase in R knee pain, reliance on BUE and L LE for reducing R LE weightbearing as ambulation progressed         Balance Overall balance assessment: Needs assistance Sitting-balance support: Feet supported;No upper extremity supported Sitting balance-Leahy Scale: Fair     Standing balance support: Bilateral upper extremity supported Standing balance-Leahy Scale: Poor Standing balance comment: requires RW support                            Cognition Arousal/Alertness: Awake/alert Behavior During Therapy: WFL for tasks assessed/performed Overall Cognitive Status: History of cognitive impairments - at baseline                                           General Comments General comments (skin integrity, edema, etc.): Pt on 2.5L O2 via [place], at rest SaO2 98% with ambulation SaO2 dropped to 87%O2, instructed in pursed lipped breathing       Pertinent Vitals/Pain Pain Assessment: Faces Faces Pain Scale: Hurts even more Pain Location: R knee Pain Descriptors / Indicators: Aching;Guarding;Sore;Throbbing Pain Intervention(s): Limited activity within patient's tolerance;Monitored during session;Repositioned    Home Living Family/patient expects to be discharged to:: Private residence Living Arrangements: Alone Available Help at Discharge: Family;Personal care attendant(almost 24 hr/day) Type of Home: House Home Access: Level entry   Home Layout: One level Home Equipment:  Grab bars - tub/shower;Tub bench;Walker - 4 wheels;Bedside commode;Hand held shower head;Transport chair      Prior Function Level of Independence: Needs assistance  Gait / Transfers Assistance Needed: ambulates with Rollator in house, transport chair for appointments  ADL's / Homemaking Assistance Needed: assist with ADLs,  iADLs      PT Goals (current goals can now be found in the care plan section) Acute Rehab PT Goals Patient Stated Goal: have stomach feel better PT Goal Formulation: With patient Time For Goal Achievement: 08/23/17 Potential to Achieve Goals: Fair Progress towards PT goals: Progressing toward goals    Frequency    Min 3X/week      PT Plan Current plan remains appropriate       AM-PAC PT "6 Clicks" Daily Activity  Outcome Measure  Difficulty turning over in bed (including adjusting bedclothes, sheets and blankets)?: A Little Difficulty moving from lying on back to sitting on the side of the bed? : A Little Difficulty sitting down on and standing up from a chair with arms (e.g., wheelchair, bedside commode, etc,.)?: Unable Help needed moving to and from a bed to chair (including a wheelchair)?: A Little Help needed walking in hospital room?: A Little Help needed climbing 3-5 steps with a railing? : A Lot 6 Click Score: 15    End of Session Equipment Utilized During Treatment: Gait belt;Oxygen Activity Tolerance: Patient limited by pain Patient left: in chair;with call bell/phone within reach;with chair alarm set Nurse Communication: Mobility status PT Visit Diagnosis: Other abnormalities of gait and mobility (R26.89);Muscle weakness (generalized) (M62.81);Difficulty in walking, not elsewhere classified (R26.2);Pain Pain - Right/Left: Right Pain - part of body: Knee     Time: 1308-6578 PT Time Calculation (min) (ACUTE ONLY): 22 min  Charges:  $Gait Training: 8-22 mins                    G Codes:       Heavenlee Maiorana B. Beverely Risen PT, DPT Acute Rehabilitation  [phone] Pager [phone]     Elon Alas Fleet 08/10/2017, 12:47 PM

## 2017-08-11 DIAGNOSIS — Z955 Presence of coronary angioplasty implant and graft: Secondary | ICD-10-CM | POA: Diagnosis not present

## 2017-08-11 DIAGNOSIS — I10 Essential (primary) hypertension: Secondary | ICD-10-CM | POA: Diagnosis not present

## 2017-08-11 DIAGNOSIS — S22060S Wedge compression fracture of T7-T8 vertebra, sequela: Secondary | ICD-10-CM | POA: Diagnosis not present

## 2017-08-11 DIAGNOSIS — R918 Other nonspecific abnormal finding of lung field: Secondary | ICD-10-CM | POA: Diagnosis not present

## 2017-08-11 DIAGNOSIS — J9691 Respiratory failure, unspecified with hypoxia: Secondary | ICD-10-CM | POA: Diagnosis not present

## 2017-08-11 DIAGNOSIS — I2510 Atherosclerotic heart disease of native coronary artery without angina pectoris: Secondary | ICD-10-CM | POA: Diagnosis not present

## 2017-08-13 DIAGNOSIS — M159 Polyosteoarthritis, unspecified: Secondary | ICD-10-CM | POA: Diagnosis not present

## 2017-08-13 DIAGNOSIS — H409 Unspecified glaucoma: Secondary | ICD-10-CM | POA: Diagnosis not present

## 2017-08-13 DIAGNOSIS — S22060S Wedge compression fracture of T7-T8 vertebra, sequela: Secondary | ICD-10-CM | POA: Diagnosis not present

## 2017-08-13 DIAGNOSIS — Z955 Presence of coronary angioplasty implant and graft: Secondary | ICD-10-CM | POA: Diagnosis not present

## 2017-08-13 DIAGNOSIS — J9691 Respiratory failure, unspecified with hypoxia: Secondary | ICD-10-CM | POA: Diagnosis not present

## 2017-08-13 DIAGNOSIS — I2510 Atherosclerotic heart disease of native coronary artery without angina pectoris: Secondary | ICD-10-CM | POA: Diagnosis not present

## 2017-08-13 DIAGNOSIS — I10 Essential (primary) hypertension: Secondary | ICD-10-CM | POA: Diagnosis not present

## 2017-08-13 DIAGNOSIS — R918 Other nonspecific abnormal finding of lung field: Secondary | ICD-10-CM | POA: Diagnosis not present

## 2017-08-15 DIAGNOSIS — S22060S Wedge compression fracture of T7-T8 vertebra, sequela: Secondary | ICD-10-CM | POA: Diagnosis not present

## 2017-08-15 DIAGNOSIS — R918 Other nonspecific abnormal finding of lung field: Secondary | ICD-10-CM | POA: Diagnosis not present

## 2017-08-15 DIAGNOSIS — I10 Essential (primary) hypertension: Secondary | ICD-10-CM | POA: Diagnosis not present

## 2017-08-15 DIAGNOSIS — J9691 Respiratory failure, unspecified with hypoxia: Secondary | ICD-10-CM | POA: Diagnosis not present

## 2017-08-15 DIAGNOSIS — I2510 Atherosclerotic heart disease of native coronary artery without angina pectoris: Secondary | ICD-10-CM | POA: Diagnosis not present

## 2017-08-15 DIAGNOSIS — Z955 Presence of coronary angioplasty implant and graft: Secondary | ICD-10-CM | POA: Diagnosis not present

## 2017-08-16 DIAGNOSIS — Z955 Presence of coronary angioplasty implant and graft: Secondary | ICD-10-CM | POA: Diagnosis not present

## 2017-08-16 DIAGNOSIS — R918 Other nonspecific abnormal finding of lung field: Secondary | ICD-10-CM | POA: Diagnosis not present

## 2017-08-16 DIAGNOSIS — I10 Essential (primary) hypertension: Secondary | ICD-10-CM | POA: Diagnosis not present

## 2017-08-16 DIAGNOSIS — J9691 Respiratory failure, unspecified with hypoxia: Secondary | ICD-10-CM | POA: Diagnosis not present

## 2017-08-16 DIAGNOSIS — I2510 Atherosclerotic heart disease of native coronary artery without angina pectoris: Secondary | ICD-10-CM | POA: Diagnosis not present

## 2017-08-16 DIAGNOSIS — S22060S Wedge compression fracture of T7-T8 vertebra, sequela: Secondary | ICD-10-CM | POA: Diagnosis not present

## 2017-08-18 DIAGNOSIS — I2510 Atherosclerotic heart disease of native coronary artery without angina pectoris: Secondary | ICD-10-CM | POA: Diagnosis not present

## 2017-08-18 DIAGNOSIS — Z955 Presence of coronary angioplasty implant and graft: Secondary | ICD-10-CM | POA: Diagnosis not present

## 2017-08-18 DIAGNOSIS — S22060S Wedge compression fracture of T7-T8 vertebra, sequela: Secondary | ICD-10-CM | POA: Diagnosis not present

## 2017-08-18 DIAGNOSIS — J9691 Respiratory failure, unspecified with hypoxia: Secondary | ICD-10-CM | POA: Diagnosis not present

## 2017-08-18 DIAGNOSIS — R918 Other nonspecific abnormal finding of lung field: Secondary | ICD-10-CM | POA: Diagnosis not present

## 2017-08-18 DIAGNOSIS — I10 Essential (primary) hypertension: Secondary | ICD-10-CM | POA: Diagnosis not present

## 2017-08-19 DIAGNOSIS — S22060S Wedge compression fracture of T7-T8 vertebra, sequela: Secondary | ICD-10-CM | POA: Diagnosis not present

## 2017-08-19 DIAGNOSIS — J9691 Respiratory failure, unspecified with hypoxia: Secondary | ICD-10-CM | POA: Diagnosis not present

## 2017-08-19 DIAGNOSIS — I10 Essential (primary) hypertension: Secondary | ICD-10-CM | POA: Diagnosis not present

## 2017-08-19 DIAGNOSIS — I2510 Atherosclerotic heart disease of native coronary artery without angina pectoris: Secondary | ICD-10-CM | POA: Diagnosis not present

## 2017-08-19 DIAGNOSIS — Z955 Presence of coronary angioplasty implant and graft: Secondary | ICD-10-CM | POA: Diagnosis not present

## 2017-08-19 DIAGNOSIS — R918 Other nonspecific abnormal finding of lung field: Secondary | ICD-10-CM | POA: Diagnosis not present

## 2017-08-20 DIAGNOSIS — J9691 Respiratory failure, unspecified with hypoxia: Secondary | ICD-10-CM | POA: Diagnosis not present

## 2017-08-20 DIAGNOSIS — I2510 Atherosclerotic heart disease of native coronary artery without angina pectoris: Secondary | ICD-10-CM | POA: Diagnosis not present

## 2017-08-20 DIAGNOSIS — R918 Other nonspecific abnormal finding of lung field: Secondary | ICD-10-CM | POA: Diagnosis not present

## 2017-08-20 DIAGNOSIS — I10 Essential (primary) hypertension: Secondary | ICD-10-CM | POA: Diagnosis not present

## 2017-08-20 DIAGNOSIS — S22060S Wedge compression fracture of T7-T8 vertebra, sequela: Secondary | ICD-10-CM | POA: Diagnosis not present

## 2017-08-20 DIAGNOSIS — Z955 Presence of coronary angioplasty implant and graft: Secondary | ICD-10-CM | POA: Diagnosis not present

## 2017-08-21 DIAGNOSIS — I10 Essential (primary) hypertension: Secondary | ICD-10-CM | POA: Diagnosis not present

## 2017-08-21 DIAGNOSIS — S22060S Wedge compression fracture of T7-T8 vertebra, sequela: Secondary | ICD-10-CM | POA: Diagnosis not present

## 2017-08-21 DIAGNOSIS — J9691 Respiratory failure, unspecified with hypoxia: Secondary | ICD-10-CM | POA: Diagnosis not present

## 2017-08-21 DIAGNOSIS — Z955 Presence of coronary angioplasty implant and graft: Secondary | ICD-10-CM | POA: Diagnosis not present

## 2017-08-21 DIAGNOSIS — R918 Other nonspecific abnormal finding of lung field: Secondary | ICD-10-CM | POA: Diagnosis not present

## 2017-08-21 DIAGNOSIS — I2510 Atherosclerotic heart disease of native coronary artery without angina pectoris: Secondary | ICD-10-CM | POA: Diagnosis not present

## 2017-08-22 DIAGNOSIS — J9691 Respiratory failure, unspecified with hypoxia: Secondary | ICD-10-CM | POA: Diagnosis not present

## 2017-08-22 DIAGNOSIS — S22060S Wedge compression fracture of T7-T8 vertebra, sequela: Secondary | ICD-10-CM | POA: Diagnosis not present

## 2017-08-22 DIAGNOSIS — Z955 Presence of coronary angioplasty implant and graft: Secondary | ICD-10-CM | POA: Diagnosis not present

## 2017-08-22 DIAGNOSIS — I10 Essential (primary) hypertension: Secondary | ICD-10-CM | POA: Diagnosis not present

## 2017-08-22 DIAGNOSIS — R918 Other nonspecific abnormal finding of lung field: Secondary | ICD-10-CM | POA: Diagnosis not present

## 2017-08-22 DIAGNOSIS — I2510 Atherosclerotic heart disease of native coronary artery without angina pectoris: Secondary | ICD-10-CM | POA: Diagnosis not present

## 2017-08-25 DIAGNOSIS — J9691 Respiratory failure, unspecified with hypoxia: Secondary | ICD-10-CM | POA: Diagnosis not present

## 2017-08-25 DIAGNOSIS — Z955 Presence of coronary angioplasty implant and graft: Secondary | ICD-10-CM | POA: Diagnosis not present

## 2017-08-25 DIAGNOSIS — S22060S Wedge compression fracture of T7-T8 vertebra, sequela: Secondary | ICD-10-CM | POA: Diagnosis not present

## 2017-08-25 DIAGNOSIS — I10 Essential (primary) hypertension: Secondary | ICD-10-CM | POA: Diagnosis not present

## 2017-08-25 DIAGNOSIS — I2510 Atherosclerotic heart disease of native coronary artery without angina pectoris: Secondary | ICD-10-CM | POA: Diagnosis not present

## 2017-08-25 DIAGNOSIS — R918 Other nonspecific abnormal finding of lung field: Secondary | ICD-10-CM | POA: Diagnosis not present

## 2017-08-30 DIAGNOSIS — R918 Other nonspecific abnormal finding of lung field: Secondary | ICD-10-CM | POA: Diagnosis not present

## 2017-08-30 DIAGNOSIS — I2510 Atherosclerotic heart disease of native coronary artery without angina pectoris: Secondary | ICD-10-CM | POA: Diagnosis not present

## 2017-08-30 DIAGNOSIS — S22060S Wedge compression fracture of T7-T8 vertebra, sequela: Secondary | ICD-10-CM | POA: Diagnosis not present

## 2017-08-30 DIAGNOSIS — Z955 Presence of coronary angioplasty implant and graft: Secondary | ICD-10-CM | POA: Diagnosis not present

## 2017-08-30 DIAGNOSIS — J9691 Respiratory failure, unspecified with hypoxia: Secondary | ICD-10-CM | POA: Diagnosis not present

## 2017-08-30 DIAGNOSIS — I10 Essential (primary) hypertension: Secondary | ICD-10-CM | POA: Diagnosis not present

## 2017-08-31 DIAGNOSIS — R918 Other nonspecific abnormal finding of lung field: Secondary | ICD-10-CM | POA: Diagnosis not present

## 2017-08-31 DIAGNOSIS — I10 Essential (primary) hypertension: Secondary | ICD-10-CM | POA: Diagnosis not present

## 2017-08-31 DIAGNOSIS — S22060S Wedge compression fracture of T7-T8 vertebra, sequela: Secondary | ICD-10-CM | POA: Diagnosis not present

## 2017-08-31 DIAGNOSIS — I2510 Atherosclerotic heart disease of native coronary artery without angina pectoris: Secondary | ICD-10-CM | POA: Diagnosis not present

## 2017-08-31 DIAGNOSIS — Z955 Presence of coronary angioplasty implant and graft: Secondary | ICD-10-CM | POA: Diagnosis not present

## 2017-08-31 DIAGNOSIS — J9691 Respiratory failure, unspecified with hypoxia: Secondary | ICD-10-CM | POA: Diagnosis not present

## 2017-09-01 DIAGNOSIS — R918 Other nonspecific abnormal finding of lung field: Secondary | ICD-10-CM | POA: Diagnosis not present

## 2017-09-01 DIAGNOSIS — Z955 Presence of coronary angioplasty implant and graft: Secondary | ICD-10-CM | POA: Diagnosis not present

## 2017-09-01 DIAGNOSIS — J9691 Respiratory failure, unspecified with hypoxia: Secondary | ICD-10-CM | POA: Diagnosis not present

## 2017-09-01 DIAGNOSIS — I10 Essential (primary) hypertension: Secondary | ICD-10-CM | POA: Diagnosis not present

## 2017-09-01 DIAGNOSIS — I2510 Atherosclerotic heart disease of native coronary artery without angina pectoris: Secondary | ICD-10-CM | POA: Diagnosis not present

## 2017-09-01 DIAGNOSIS — S22060S Wedge compression fracture of T7-T8 vertebra, sequela: Secondary | ICD-10-CM | POA: Diagnosis not present

## 2017-09-02 DIAGNOSIS — N390 Urinary tract infection, site not specified: Secondary | ICD-10-CM | POA: Diagnosis not present

## 2017-09-03 DIAGNOSIS — I10 Essential (primary) hypertension: Secondary | ICD-10-CM | POA: Diagnosis not present

## 2017-09-03 DIAGNOSIS — Z955 Presence of coronary angioplasty implant and graft: Secondary | ICD-10-CM | POA: Diagnosis not present

## 2017-09-03 DIAGNOSIS — J9691 Respiratory failure, unspecified with hypoxia: Secondary | ICD-10-CM | POA: Diagnosis not present

## 2017-09-03 DIAGNOSIS — S22060S Wedge compression fracture of T7-T8 vertebra, sequela: Secondary | ICD-10-CM | POA: Diagnosis not present

## 2017-09-03 DIAGNOSIS — I2510 Atherosclerotic heart disease of native coronary artery without angina pectoris: Secondary | ICD-10-CM | POA: Diagnosis not present

## 2017-09-03 DIAGNOSIS — R918 Other nonspecific abnormal finding of lung field: Secondary | ICD-10-CM | POA: Diagnosis not present

## 2017-09-04 DIAGNOSIS — I10 Essential (primary) hypertension: Secondary | ICD-10-CM | POA: Diagnosis not present

## 2017-09-04 DIAGNOSIS — S22060S Wedge compression fracture of T7-T8 vertebra, sequela: Secondary | ICD-10-CM | POA: Diagnosis not present

## 2017-09-04 DIAGNOSIS — I2510 Atherosclerotic heart disease of native coronary artery without angina pectoris: Secondary | ICD-10-CM | POA: Diagnosis not present

## 2017-09-04 DIAGNOSIS — R918 Other nonspecific abnormal finding of lung field: Secondary | ICD-10-CM | POA: Diagnosis not present

## 2017-09-04 DIAGNOSIS — Z955 Presence of coronary angioplasty implant and graft: Secondary | ICD-10-CM | POA: Diagnosis not present

## 2017-09-04 DIAGNOSIS — J9691 Respiratory failure, unspecified with hypoxia: Secondary | ICD-10-CM | POA: Diagnosis not present

## 2017-09-05 DIAGNOSIS — Z955 Presence of coronary angioplasty implant and graft: Secondary | ICD-10-CM | POA: Diagnosis not present

## 2017-09-05 DIAGNOSIS — R918 Other nonspecific abnormal finding of lung field: Secondary | ICD-10-CM | POA: Diagnosis not present

## 2017-09-05 DIAGNOSIS — S22060S Wedge compression fracture of T7-T8 vertebra, sequela: Secondary | ICD-10-CM | POA: Diagnosis not present

## 2017-09-05 DIAGNOSIS — I10 Essential (primary) hypertension: Secondary | ICD-10-CM | POA: Diagnosis not present

## 2017-09-05 DIAGNOSIS — I2510 Atherosclerotic heart disease of native coronary artery without angina pectoris: Secondary | ICD-10-CM | POA: Diagnosis not present

## 2017-09-05 DIAGNOSIS — J9691 Respiratory failure, unspecified with hypoxia: Secondary | ICD-10-CM | POA: Diagnosis not present

## 2017-09-06 DIAGNOSIS — J9691 Respiratory failure, unspecified with hypoxia: Secondary | ICD-10-CM | POA: Diagnosis not present

## 2017-09-06 DIAGNOSIS — Z955 Presence of coronary angioplasty implant and graft: Secondary | ICD-10-CM | POA: Diagnosis not present

## 2017-09-06 DIAGNOSIS — I2510 Atherosclerotic heart disease of native coronary artery without angina pectoris: Secondary | ICD-10-CM | POA: Diagnosis not present

## 2017-09-06 DIAGNOSIS — S22060S Wedge compression fracture of T7-T8 vertebra, sequela: Secondary | ICD-10-CM | POA: Diagnosis not present

## 2017-09-06 DIAGNOSIS — I10 Essential (primary) hypertension: Secondary | ICD-10-CM | POA: Diagnosis not present

## 2017-09-06 DIAGNOSIS — R918 Other nonspecific abnormal finding of lung field: Secondary | ICD-10-CM | POA: Diagnosis not present

## 2017-09-07 DIAGNOSIS — R918 Other nonspecific abnormal finding of lung field: Secondary | ICD-10-CM | POA: Diagnosis not present

## 2017-09-07 DIAGNOSIS — S22060S Wedge compression fracture of T7-T8 vertebra, sequela: Secondary | ICD-10-CM | POA: Diagnosis not present

## 2017-09-07 DIAGNOSIS — I2510 Atherosclerotic heart disease of native coronary artery without angina pectoris: Secondary | ICD-10-CM | POA: Diagnosis not present

## 2017-09-07 DIAGNOSIS — I10 Essential (primary) hypertension: Secondary | ICD-10-CM | POA: Diagnosis not present

## 2017-09-07 DIAGNOSIS — J9691 Respiratory failure, unspecified with hypoxia: Secondary | ICD-10-CM | POA: Diagnosis not present

## 2017-09-07 DIAGNOSIS — Z955 Presence of coronary angioplasty implant and graft: Secondary | ICD-10-CM | POA: Diagnosis not present

## 2017-09-08 DIAGNOSIS — R918 Other nonspecific abnormal finding of lung field: Secondary | ICD-10-CM | POA: Diagnosis not present

## 2017-09-08 DIAGNOSIS — J9691 Respiratory failure, unspecified with hypoxia: Secondary | ICD-10-CM | POA: Diagnosis not present

## 2017-09-08 DIAGNOSIS — S22060S Wedge compression fracture of T7-T8 vertebra, sequela: Secondary | ICD-10-CM | POA: Diagnosis not present

## 2017-09-08 DIAGNOSIS — Z955 Presence of coronary angioplasty implant and graft: Secondary | ICD-10-CM | POA: Diagnosis not present

## 2017-09-08 DIAGNOSIS — I2510 Atherosclerotic heart disease of native coronary artery without angina pectoris: Secondary | ICD-10-CM | POA: Diagnosis not present

## 2017-09-08 DIAGNOSIS — I10 Essential (primary) hypertension: Secondary | ICD-10-CM | POA: Diagnosis not present

## 2017-09-09 DIAGNOSIS — R918 Other nonspecific abnormal finding of lung field: Secondary | ICD-10-CM | POA: Diagnosis not present

## 2017-09-09 DIAGNOSIS — J9691 Respiratory failure, unspecified with hypoxia: Secondary | ICD-10-CM | POA: Diagnosis not present

## 2017-09-09 DIAGNOSIS — I2510 Atherosclerotic heart disease of native coronary artery without angina pectoris: Secondary | ICD-10-CM | POA: Diagnosis not present

## 2017-09-09 DIAGNOSIS — Z955 Presence of coronary angioplasty implant and graft: Secondary | ICD-10-CM | POA: Diagnosis not present

## 2017-09-09 DIAGNOSIS — S22060S Wedge compression fracture of T7-T8 vertebra, sequela: Secondary | ICD-10-CM | POA: Diagnosis not present

## 2017-09-09 DIAGNOSIS — I10 Essential (primary) hypertension: Secondary | ICD-10-CM | POA: Diagnosis not present

## 2017-09-10 DIAGNOSIS — K219 Gastro-esophageal reflux disease without esophagitis: Secondary | ICD-10-CM | POA: Diagnosis not present

## 2017-09-10 DIAGNOSIS — J9691 Respiratory failure, unspecified with hypoxia: Secondary | ICD-10-CM | POA: Diagnosis not present

## 2017-09-10 DIAGNOSIS — I2510 Atherosclerotic heart disease of native coronary artery without angina pectoris: Secondary | ICD-10-CM | POA: Diagnosis not present

## 2017-09-10 DIAGNOSIS — H409 Unspecified glaucoma: Secondary | ICD-10-CM | POA: Diagnosis not present

## 2017-09-10 DIAGNOSIS — I10 Essential (primary) hypertension: Secondary | ICD-10-CM | POA: Diagnosis not present

## 2017-09-10 DIAGNOSIS — G2581 Restless legs syndrome: Secondary | ICD-10-CM | POA: Diagnosis not present

## 2017-09-10 DIAGNOSIS — F339 Major depressive disorder, recurrent, unspecified: Secondary | ICD-10-CM | POA: Diagnosis not present

## 2017-09-12 DIAGNOSIS — G2581 Restless legs syndrome: Secondary | ICD-10-CM | POA: Diagnosis not present

## 2017-09-12 DIAGNOSIS — J9691 Respiratory failure, unspecified with hypoxia: Secondary | ICD-10-CM | POA: Diagnosis not present

## 2017-09-12 DIAGNOSIS — I2510 Atherosclerotic heart disease of native coronary artery without angina pectoris: Secondary | ICD-10-CM | POA: Diagnosis not present

## 2017-09-12 DIAGNOSIS — F339 Major depressive disorder, recurrent, unspecified: Secondary | ICD-10-CM | POA: Diagnosis not present

## 2017-09-12 DIAGNOSIS — H409 Unspecified glaucoma: Secondary | ICD-10-CM | POA: Diagnosis not present

## 2017-09-12 DIAGNOSIS — K219 Gastro-esophageal reflux disease without esophagitis: Secondary | ICD-10-CM | POA: Diagnosis not present

## 2017-09-12 DIAGNOSIS — I10 Essential (primary) hypertension: Secondary | ICD-10-CM | POA: Diagnosis not present

## 2017-09-13 DIAGNOSIS — I10 Essential (primary) hypertension: Secondary | ICD-10-CM | POA: Diagnosis not present

## 2017-09-13 DIAGNOSIS — F339 Major depressive disorder, recurrent, unspecified: Secondary | ICD-10-CM | POA: Diagnosis not present

## 2017-09-13 DIAGNOSIS — G2581 Restless legs syndrome: Secondary | ICD-10-CM | POA: Diagnosis not present

## 2017-09-13 DIAGNOSIS — H409 Unspecified glaucoma: Secondary | ICD-10-CM | POA: Diagnosis not present

## 2017-09-13 DIAGNOSIS — J9691 Respiratory failure, unspecified with hypoxia: Secondary | ICD-10-CM | POA: Diagnosis not present

## 2017-09-13 DIAGNOSIS — I2510 Atherosclerotic heart disease of native coronary artery without angina pectoris: Secondary | ICD-10-CM | POA: Diagnosis not present

## 2017-09-14 ENCOUNTER — Other Ambulatory Visit: Payer: Self-pay | Admitting: Cardiology

## 2017-09-16 DIAGNOSIS — I2510 Atherosclerotic heart disease of native coronary artery without angina pectoris: Secondary | ICD-10-CM | POA: Diagnosis not present

## 2017-09-16 DIAGNOSIS — J9691 Respiratory failure, unspecified with hypoxia: Secondary | ICD-10-CM | POA: Diagnosis not present

## 2017-09-16 DIAGNOSIS — F339 Major depressive disorder, recurrent, unspecified: Secondary | ICD-10-CM | POA: Diagnosis not present

## 2017-09-16 DIAGNOSIS — I10 Essential (primary) hypertension: Secondary | ICD-10-CM | POA: Diagnosis not present

## 2017-09-16 DIAGNOSIS — G2581 Restless legs syndrome: Secondary | ICD-10-CM | POA: Diagnosis not present

## 2017-09-16 DIAGNOSIS — H409 Unspecified glaucoma: Secondary | ICD-10-CM | POA: Diagnosis not present

## 2017-09-20 DIAGNOSIS — G2581 Restless legs syndrome: Secondary | ICD-10-CM | POA: Diagnosis not present

## 2017-09-20 DIAGNOSIS — I10 Essential (primary) hypertension: Secondary | ICD-10-CM | POA: Diagnosis not present

## 2017-09-20 DIAGNOSIS — I2510 Atherosclerotic heart disease of native coronary artery without angina pectoris: Secondary | ICD-10-CM | POA: Diagnosis not present

## 2017-09-20 DIAGNOSIS — F339 Major depressive disorder, recurrent, unspecified: Secondary | ICD-10-CM | POA: Diagnosis not present

## 2017-09-20 DIAGNOSIS — J9691 Respiratory failure, unspecified with hypoxia: Secondary | ICD-10-CM | POA: Diagnosis not present

## 2017-09-20 DIAGNOSIS — H409 Unspecified glaucoma: Secondary | ICD-10-CM | POA: Diagnosis not present

## 2017-09-23 DIAGNOSIS — J9691 Respiratory failure, unspecified with hypoxia: Secondary | ICD-10-CM | POA: Diagnosis not present

## 2017-09-23 DIAGNOSIS — F339 Major depressive disorder, recurrent, unspecified: Secondary | ICD-10-CM | POA: Diagnosis not present

## 2017-09-23 DIAGNOSIS — I10 Essential (primary) hypertension: Secondary | ICD-10-CM | POA: Diagnosis not present

## 2017-09-23 DIAGNOSIS — I2510 Atherosclerotic heart disease of native coronary artery without angina pectoris: Secondary | ICD-10-CM | POA: Diagnosis not present

## 2017-09-23 DIAGNOSIS — G2581 Restless legs syndrome: Secondary | ICD-10-CM | POA: Diagnosis not present

## 2017-09-23 DIAGNOSIS — H409 Unspecified glaucoma: Secondary | ICD-10-CM | POA: Diagnosis not present

## 2017-09-27 DIAGNOSIS — H409 Unspecified glaucoma: Secondary | ICD-10-CM | POA: Diagnosis not present

## 2017-09-27 DIAGNOSIS — F339 Major depressive disorder, recurrent, unspecified: Secondary | ICD-10-CM | POA: Diagnosis not present

## 2017-09-27 DIAGNOSIS — I10 Essential (primary) hypertension: Secondary | ICD-10-CM | POA: Diagnosis not present

## 2017-09-27 DIAGNOSIS — J9691 Respiratory failure, unspecified with hypoxia: Secondary | ICD-10-CM | POA: Diagnosis not present

## 2017-09-27 DIAGNOSIS — I2510 Atherosclerotic heart disease of native coronary artery without angina pectoris: Secondary | ICD-10-CM | POA: Diagnosis not present

## 2017-09-27 DIAGNOSIS — G2581 Restless legs syndrome: Secondary | ICD-10-CM | POA: Diagnosis not present

## 2017-09-28 DIAGNOSIS — G2581 Restless legs syndrome: Secondary | ICD-10-CM | POA: Diagnosis not present

## 2017-09-28 DIAGNOSIS — H409 Unspecified glaucoma: Secondary | ICD-10-CM | POA: Diagnosis not present

## 2017-09-28 DIAGNOSIS — J9691 Respiratory failure, unspecified with hypoxia: Secondary | ICD-10-CM | POA: Diagnosis not present

## 2017-09-28 DIAGNOSIS — I2510 Atherosclerotic heart disease of native coronary artery without angina pectoris: Secondary | ICD-10-CM | POA: Diagnosis not present

## 2017-09-28 DIAGNOSIS — I10 Essential (primary) hypertension: Secondary | ICD-10-CM | POA: Diagnosis not present

## 2017-09-28 DIAGNOSIS — F339 Major depressive disorder, recurrent, unspecified: Secondary | ICD-10-CM | POA: Diagnosis not present

## 2017-09-30 DIAGNOSIS — H409 Unspecified glaucoma: Secondary | ICD-10-CM | POA: Diagnosis not present

## 2017-09-30 DIAGNOSIS — I2510 Atherosclerotic heart disease of native coronary artery without angina pectoris: Secondary | ICD-10-CM | POA: Diagnosis not present

## 2017-09-30 DIAGNOSIS — J9691 Respiratory failure, unspecified with hypoxia: Secondary | ICD-10-CM | POA: Diagnosis not present

## 2017-09-30 DIAGNOSIS — F339 Major depressive disorder, recurrent, unspecified: Secondary | ICD-10-CM | POA: Diagnosis not present

## 2017-09-30 DIAGNOSIS — I10 Essential (primary) hypertension: Secondary | ICD-10-CM | POA: Diagnosis not present

## 2017-09-30 DIAGNOSIS — G2581 Restless legs syndrome: Secondary | ICD-10-CM | POA: Diagnosis not present

## 2017-10-03 DIAGNOSIS — I2510 Atherosclerotic heart disease of native coronary artery without angina pectoris: Secondary | ICD-10-CM | POA: Diagnosis not present

## 2017-10-03 DIAGNOSIS — G2581 Restless legs syndrome: Secondary | ICD-10-CM | POA: Diagnosis not present

## 2017-10-03 DIAGNOSIS — H409 Unspecified glaucoma: Secondary | ICD-10-CM | POA: Diagnosis not present

## 2017-10-03 DIAGNOSIS — J9691 Respiratory failure, unspecified with hypoxia: Secondary | ICD-10-CM | POA: Diagnosis not present

## 2017-10-03 DIAGNOSIS — F339 Major depressive disorder, recurrent, unspecified: Secondary | ICD-10-CM | POA: Diagnosis not present

## 2017-10-03 DIAGNOSIS — I10 Essential (primary) hypertension: Secondary | ICD-10-CM | POA: Diagnosis not present

## 2017-10-04 DIAGNOSIS — J9691 Respiratory failure, unspecified with hypoxia: Secondary | ICD-10-CM | POA: Diagnosis not present

## 2017-10-04 DIAGNOSIS — H409 Unspecified glaucoma: Secondary | ICD-10-CM | POA: Diagnosis not present

## 2017-10-04 DIAGNOSIS — G2581 Restless legs syndrome: Secondary | ICD-10-CM | POA: Diagnosis not present

## 2017-10-04 DIAGNOSIS — F339 Major depressive disorder, recurrent, unspecified: Secondary | ICD-10-CM | POA: Diagnosis not present

## 2017-10-04 DIAGNOSIS — I2510 Atherosclerotic heart disease of native coronary artery without angina pectoris: Secondary | ICD-10-CM | POA: Diagnosis not present

## 2017-10-04 DIAGNOSIS — I10 Essential (primary) hypertension: Secondary | ICD-10-CM | POA: Diagnosis not present

## 2017-10-06 DIAGNOSIS — I10 Essential (primary) hypertension: Secondary | ICD-10-CM | POA: Diagnosis not present

## 2017-10-06 DIAGNOSIS — H409 Unspecified glaucoma: Secondary | ICD-10-CM | POA: Diagnosis not present

## 2017-10-06 DIAGNOSIS — J9691 Respiratory failure, unspecified with hypoxia: Secondary | ICD-10-CM | POA: Diagnosis not present

## 2017-10-06 DIAGNOSIS — G2581 Restless legs syndrome: Secondary | ICD-10-CM | POA: Diagnosis not present

## 2017-10-06 DIAGNOSIS — I2510 Atherosclerotic heart disease of native coronary artery without angina pectoris: Secondary | ICD-10-CM | POA: Diagnosis not present

## 2017-10-06 DIAGNOSIS — F339 Major depressive disorder, recurrent, unspecified: Secondary | ICD-10-CM | POA: Diagnosis not present

## 2017-10-07 DIAGNOSIS — I10 Essential (primary) hypertension: Secondary | ICD-10-CM | POA: Diagnosis not present

## 2017-10-07 DIAGNOSIS — H409 Unspecified glaucoma: Secondary | ICD-10-CM | POA: Diagnosis not present

## 2017-10-07 DIAGNOSIS — F339 Major depressive disorder, recurrent, unspecified: Secondary | ICD-10-CM | POA: Diagnosis not present

## 2017-10-07 DIAGNOSIS — J9691 Respiratory failure, unspecified with hypoxia: Secondary | ICD-10-CM | POA: Diagnosis not present

## 2017-10-07 DIAGNOSIS — I2510 Atherosclerotic heart disease of native coronary artery without angina pectoris: Secondary | ICD-10-CM | POA: Diagnosis not present

## 2017-10-07 DIAGNOSIS — G2581 Restless legs syndrome: Secondary | ICD-10-CM | POA: Diagnosis not present

## 2017-10-11 DIAGNOSIS — H409 Unspecified glaucoma: Secondary | ICD-10-CM | POA: Diagnosis not present

## 2017-10-11 DIAGNOSIS — I2510 Atherosclerotic heart disease of native coronary artery without angina pectoris: Secondary | ICD-10-CM | POA: Diagnosis not present

## 2017-10-11 DIAGNOSIS — G2581 Restless legs syndrome: Secondary | ICD-10-CM | POA: Diagnosis not present

## 2017-10-11 DIAGNOSIS — F339 Major depressive disorder, recurrent, unspecified: Secondary | ICD-10-CM | POA: Diagnosis not present

## 2017-10-11 DIAGNOSIS — J9691 Respiratory failure, unspecified with hypoxia: Secondary | ICD-10-CM | POA: Diagnosis not present

## 2017-10-11 DIAGNOSIS — I10 Essential (primary) hypertension: Secondary | ICD-10-CM | POA: Diagnosis not present

## 2017-10-13 DIAGNOSIS — H409 Unspecified glaucoma: Secondary | ICD-10-CM | POA: Diagnosis not present

## 2017-10-13 DIAGNOSIS — F339 Major depressive disorder, recurrent, unspecified: Secondary | ICD-10-CM | POA: Diagnosis not present

## 2017-10-13 DIAGNOSIS — I2510 Atherosclerotic heart disease of native coronary artery without angina pectoris: Secondary | ICD-10-CM | POA: Diagnosis not present

## 2017-10-13 DIAGNOSIS — J9691 Respiratory failure, unspecified with hypoxia: Secondary | ICD-10-CM | POA: Diagnosis not present

## 2017-10-13 DIAGNOSIS — G2581 Restless legs syndrome: Secondary | ICD-10-CM | POA: Diagnosis not present

## 2017-10-13 DIAGNOSIS — I10 Essential (primary) hypertension: Secondary | ICD-10-CM | POA: Diagnosis not present

## 2017-10-13 DIAGNOSIS — K219 Gastro-esophageal reflux disease without esophagitis: Secondary | ICD-10-CM | POA: Diagnosis not present

## 2017-10-14 DIAGNOSIS — I10 Essential (primary) hypertension: Secondary | ICD-10-CM | POA: Diagnosis not present

## 2017-10-14 DIAGNOSIS — H409 Unspecified glaucoma: Secondary | ICD-10-CM | POA: Diagnosis not present

## 2017-10-14 DIAGNOSIS — I2510 Atherosclerotic heart disease of native coronary artery without angina pectoris: Secondary | ICD-10-CM | POA: Diagnosis not present

## 2017-10-14 DIAGNOSIS — F339 Major depressive disorder, recurrent, unspecified: Secondary | ICD-10-CM | POA: Diagnosis not present

## 2017-10-14 DIAGNOSIS — G2581 Restless legs syndrome: Secondary | ICD-10-CM | POA: Diagnosis not present

## 2017-10-14 DIAGNOSIS — J9691 Respiratory failure, unspecified with hypoxia: Secondary | ICD-10-CM | POA: Diagnosis not present

## 2017-10-18 DIAGNOSIS — J9691 Respiratory failure, unspecified with hypoxia: Secondary | ICD-10-CM | POA: Diagnosis not present

## 2017-10-18 DIAGNOSIS — I2510 Atherosclerotic heart disease of native coronary artery without angina pectoris: Secondary | ICD-10-CM | POA: Diagnosis not present

## 2017-10-18 DIAGNOSIS — I10 Essential (primary) hypertension: Secondary | ICD-10-CM | POA: Diagnosis not present

## 2017-10-18 DIAGNOSIS — G2581 Restless legs syndrome: Secondary | ICD-10-CM | POA: Diagnosis not present

## 2017-10-18 DIAGNOSIS — H409 Unspecified glaucoma: Secondary | ICD-10-CM | POA: Diagnosis not present

## 2017-10-18 DIAGNOSIS — F339 Major depressive disorder, recurrent, unspecified: Secondary | ICD-10-CM | POA: Diagnosis not present

## 2017-10-19 DIAGNOSIS — I2510 Atherosclerotic heart disease of native coronary artery without angina pectoris: Secondary | ICD-10-CM | POA: Diagnosis not present

## 2017-10-19 DIAGNOSIS — G2581 Restless legs syndrome: Secondary | ICD-10-CM | POA: Diagnosis not present

## 2017-10-19 DIAGNOSIS — H409 Unspecified glaucoma: Secondary | ICD-10-CM | POA: Diagnosis not present

## 2017-10-19 DIAGNOSIS — J9691 Respiratory failure, unspecified with hypoxia: Secondary | ICD-10-CM | POA: Diagnosis not present

## 2017-10-19 DIAGNOSIS — F339 Major depressive disorder, recurrent, unspecified: Secondary | ICD-10-CM | POA: Diagnosis not present

## 2017-10-19 DIAGNOSIS — I10 Essential (primary) hypertension: Secondary | ICD-10-CM | POA: Diagnosis not present

## 2017-10-20 DIAGNOSIS — I2510 Atherosclerotic heart disease of native coronary artery without angina pectoris: Secondary | ICD-10-CM | POA: Diagnosis not present

## 2017-10-20 DIAGNOSIS — J9691 Respiratory failure, unspecified with hypoxia: Secondary | ICD-10-CM | POA: Diagnosis not present

## 2017-10-20 DIAGNOSIS — H409 Unspecified glaucoma: Secondary | ICD-10-CM | POA: Diagnosis not present

## 2017-10-20 DIAGNOSIS — I10 Essential (primary) hypertension: Secondary | ICD-10-CM | POA: Diagnosis not present

## 2017-10-20 DIAGNOSIS — F339 Major depressive disorder, recurrent, unspecified: Secondary | ICD-10-CM | POA: Diagnosis not present

## 2017-10-20 DIAGNOSIS — G2581 Restless legs syndrome: Secondary | ICD-10-CM | POA: Diagnosis not present

## 2017-10-21 DIAGNOSIS — J9691 Respiratory failure, unspecified with hypoxia: Secondary | ICD-10-CM | POA: Diagnosis not present

## 2017-10-21 DIAGNOSIS — F339 Major depressive disorder, recurrent, unspecified: Secondary | ICD-10-CM | POA: Diagnosis not present

## 2017-10-21 DIAGNOSIS — I2510 Atherosclerotic heart disease of native coronary artery without angina pectoris: Secondary | ICD-10-CM | POA: Diagnosis not present

## 2017-10-21 DIAGNOSIS — G2581 Restless legs syndrome: Secondary | ICD-10-CM | POA: Diagnosis not present

## 2017-10-21 DIAGNOSIS — I10 Essential (primary) hypertension: Secondary | ICD-10-CM | POA: Diagnosis not present

## 2017-10-21 DIAGNOSIS — H409 Unspecified glaucoma: Secondary | ICD-10-CM | POA: Diagnosis not present

## 2017-10-25 DIAGNOSIS — J9691 Respiratory failure, unspecified with hypoxia: Secondary | ICD-10-CM | POA: Diagnosis not present

## 2017-10-25 DIAGNOSIS — G2581 Restless legs syndrome: Secondary | ICD-10-CM | POA: Diagnosis not present

## 2017-10-25 DIAGNOSIS — I10 Essential (primary) hypertension: Secondary | ICD-10-CM | POA: Diagnosis not present

## 2017-10-25 DIAGNOSIS — I2510 Atherosclerotic heart disease of native coronary artery without angina pectoris: Secondary | ICD-10-CM | POA: Diagnosis not present

## 2017-10-25 DIAGNOSIS — F339 Major depressive disorder, recurrent, unspecified: Secondary | ICD-10-CM | POA: Diagnosis not present

## 2017-10-25 DIAGNOSIS — H409 Unspecified glaucoma: Secondary | ICD-10-CM | POA: Diagnosis not present

## 2017-10-27 DIAGNOSIS — I10 Essential (primary) hypertension: Secondary | ICD-10-CM | POA: Diagnosis not present

## 2017-10-27 DIAGNOSIS — J9691 Respiratory failure, unspecified with hypoxia: Secondary | ICD-10-CM | POA: Diagnosis not present

## 2017-10-27 DIAGNOSIS — I2510 Atherosclerotic heart disease of native coronary artery without angina pectoris: Secondary | ICD-10-CM | POA: Diagnosis not present

## 2017-10-27 DIAGNOSIS — F339 Major depressive disorder, recurrent, unspecified: Secondary | ICD-10-CM | POA: Diagnosis not present

## 2017-10-27 DIAGNOSIS — H409 Unspecified glaucoma: Secondary | ICD-10-CM | POA: Diagnosis not present

## 2017-10-27 DIAGNOSIS — G2581 Restless legs syndrome: Secondary | ICD-10-CM | POA: Diagnosis not present

## 2017-10-28 DIAGNOSIS — F339 Major depressive disorder, recurrent, unspecified: Secondary | ICD-10-CM | POA: Diagnosis not present

## 2017-10-28 DIAGNOSIS — G2581 Restless legs syndrome: Secondary | ICD-10-CM | POA: Diagnosis not present

## 2017-10-28 DIAGNOSIS — J9691 Respiratory failure, unspecified with hypoxia: Secondary | ICD-10-CM | POA: Diagnosis not present

## 2017-10-28 DIAGNOSIS — H409 Unspecified glaucoma: Secondary | ICD-10-CM | POA: Diagnosis not present

## 2017-10-28 DIAGNOSIS — I2510 Atherosclerotic heart disease of native coronary artery without angina pectoris: Secondary | ICD-10-CM | POA: Diagnosis not present

## 2017-10-28 DIAGNOSIS — I10 Essential (primary) hypertension: Secondary | ICD-10-CM | POA: Diagnosis not present

## 2017-11-01 DIAGNOSIS — H409 Unspecified glaucoma: Secondary | ICD-10-CM | POA: Diagnosis not present

## 2017-11-01 DIAGNOSIS — I10 Essential (primary) hypertension: Secondary | ICD-10-CM | POA: Diagnosis not present

## 2017-11-01 DIAGNOSIS — F339 Major depressive disorder, recurrent, unspecified: Secondary | ICD-10-CM | POA: Diagnosis not present

## 2017-11-01 DIAGNOSIS — J9691 Respiratory failure, unspecified with hypoxia: Secondary | ICD-10-CM | POA: Diagnosis not present

## 2017-11-01 DIAGNOSIS — G2581 Restless legs syndrome: Secondary | ICD-10-CM | POA: Diagnosis not present

## 2017-11-01 DIAGNOSIS — I2510 Atherosclerotic heart disease of native coronary artery without angina pectoris: Secondary | ICD-10-CM | POA: Diagnosis not present

## 2017-11-04 DIAGNOSIS — F339 Major depressive disorder, recurrent, unspecified: Secondary | ICD-10-CM | POA: Diagnosis not present

## 2017-11-04 DIAGNOSIS — G2581 Restless legs syndrome: Secondary | ICD-10-CM | POA: Diagnosis not present

## 2017-11-04 DIAGNOSIS — H409 Unspecified glaucoma: Secondary | ICD-10-CM | POA: Diagnosis not present

## 2017-11-04 DIAGNOSIS — I2510 Atherosclerotic heart disease of native coronary artery without angina pectoris: Secondary | ICD-10-CM | POA: Diagnosis not present

## 2017-11-04 DIAGNOSIS — J9691 Respiratory failure, unspecified with hypoxia: Secondary | ICD-10-CM | POA: Diagnosis not present

## 2017-11-04 DIAGNOSIS — I10 Essential (primary) hypertension: Secondary | ICD-10-CM | POA: Diagnosis not present

## 2017-11-08 DIAGNOSIS — H409 Unspecified glaucoma: Secondary | ICD-10-CM | POA: Diagnosis not present

## 2017-11-08 DIAGNOSIS — I10 Essential (primary) hypertension: Secondary | ICD-10-CM | POA: Diagnosis not present

## 2017-11-08 DIAGNOSIS — I2510 Atherosclerotic heart disease of native coronary artery without angina pectoris: Secondary | ICD-10-CM | POA: Diagnosis not present

## 2017-11-08 DIAGNOSIS — J9691 Respiratory failure, unspecified with hypoxia: Secondary | ICD-10-CM | POA: Diagnosis not present

## 2017-11-08 DIAGNOSIS — F339 Major depressive disorder, recurrent, unspecified: Secondary | ICD-10-CM | POA: Diagnosis not present

## 2017-11-08 DIAGNOSIS — G2581 Restless legs syndrome: Secondary | ICD-10-CM | POA: Diagnosis not present

## 2017-11-10 DIAGNOSIS — F339 Major depressive disorder, recurrent, unspecified: Secondary | ICD-10-CM | POA: Diagnosis not present

## 2017-11-10 DIAGNOSIS — I2510 Atherosclerotic heart disease of native coronary artery without angina pectoris: Secondary | ICD-10-CM | POA: Diagnosis not present

## 2017-11-10 DIAGNOSIS — I10 Essential (primary) hypertension: Secondary | ICD-10-CM | POA: Diagnosis not present

## 2017-11-10 DIAGNOSIS — H409 Unspecified glaucoma: Secondary | ICD-10-CM | POA: Diagnosis not present

## 2017-11-10 DIAGNOSIS — J9691 Respiratory failure, unspecified with hypoxia: Secondary | ICD-10-CM | POA: Diagnosis not present

## 2017-11-10 DIAGNOSIS — G2581 Restless legs syndrome: Secondary | ICD-10-CM | POA: Diagnosis not present

## 2017-11-11 DIAGNOSIS — J9691 Respiratory failure, unspecified with hypoxia: Secondary | ICD-10-CM | POA: Diagnosis not present

## 2017-11-11 DIAGNOSIS — I10 Essential (primary) hypertension: Secondary | ICD-10-CM | POA: Diagnosis not present

## 2017-11-11 DIAGNOSIS — G2581 Restless legs syndrome: Secondary | ICD-10-CM | POA: Diagnosis not present

## 2017-11-11 DIAGNOSIS — F339 Major depressive disorder, recurrent, unspecified: Secondary | ICD-10-CM | POA: Diagnosis not present

## 2017-11-11 DIAGNOSIS — I2510 Atherosclerotic heart disease of native coronary artery without angina pectoris: Secondary | ICD-10-CM | POA: Diagnosis not present

## 2017-11-11 DIAGNOSIS — H409 Unspecified glaucoma: Secondary | ICD-10-CM | POA: Diagnosis not present

## 2017-11-13 DIAGNOSIS — F339 Major depressive disorder, recurrent, unspecified: Secondary | ICD-10-CM | POA: Diagnosis not present

## 2017-11-13 DIAGNOSIS — I10 Essential (primary) hypertension: Secondary | ICD-10-CM | POA: Diagnosis not present

## 2017-11-13 DIAGNOSIS — H409 Unspecified glaucoma: Secondary | ICD-10-CM | POA: Diagnosis not present

## 2017-11-13 DIAGNOSIS — G2581 Restless legs syndrome: Secondary | ICD-10-CM | POA: Diagnosis not present

## 2017-11-13 DIAGNOSIS — I2510 Atherosclerotic heart disease of native coronary artery without angina pectoris: Secondary | ICD-10-CM | POA: Diagnosis not present

## 2017-11-13 DIAGNOSIS — J9691 Respiratory failure, unspecified with hypoxia: Secondary | ICD-10-CM | POA: Diagnosis not present

## 2017-11-13 DIAGNOSIS — K219 Gastro-esophageal reflux disease without esophagitis: Secondary | ICD-10-CM | POA: Diagnosis not present

## 2017-11-15 DIAGNOSIS — I2510 Atherosclerotic heart disease of native coronary artery without angina pectoris: Secondary | ICD-10-CM | POA: Diagnosis not present

## 2017-11-15 DIAGNOSIS — F339 Major depressive disorder, recurrent, unspecified: Secondary | ICD-10-CM | POA: Diagnosis not present

## 2017-11-15 DIAGNOSIS — J9691 Respiratory failure, unspecified with hypoxia: Secondary | ICD-10-CM | POA: Diagnosis not present

## 2017-11-15 DIAGNOSIS — G2581 Restless legs syndrome: Secondary | ICD-10-CM | POA: Diagnosis not present

## 2017-11-15 DIAGNOSIS — H409 Unspecified glaucoma: Secondary | ICD-10-CM | POA: Diagnosis not present

## 2017-11-15 DIAGNOSIS — I10 Essential (primary) hypertension: Secondary | ICD-10-CM | POA: Diagnosis not present

## 2017-11-18 DIAGNOSIS — G2581 Restless legs syndrome: Secondary | ICD-10-CM | POA: Diagnosis not present

## 2017-11-18 DIAGNOSIS — F339 Major depressive disorder, recurrent, unspecified: Secondary | ICD-10-CM | POA: Diagnosis not present

## 2017-11-18 DIAGNOSIS — H409 Unspecified glaucoma: Secondary | ICD-10-CM | POA: Diagnosis not present

## 2017-11-18 DIAGNOSIS — J9691 Respiratory failure, unspecified with hypoxia: Secondary | ICD-10-CM | POA: Diagnosis not present

## 2017-11-18 DIAGNOSIS — I10 Essential (primary) hypertension: Secondary | ICD-10-CM | POA: Diagnosis not present

## 2017-11-18 DIAGNOSIS — I2510 Atherosclerotic heart disease of native coronary artery without angina pectoris: Secondary | ICD-10-CM | POA: Diagnosis not present

## 2017-11-22 DIAGNOSIS — F339 Major depressive disorder, recurrent, unspecified: Secondary | ICD-10-CM | POA: Diagnosis not present

## 2017-11-22 DIAGNOSIS — I2510 Atherosclerotic heart disease of native coronary artery without angina pectoris: Secondary | ICD-10-CM | POA: Diagnosis not present

## 2017-11-22 DIAGNOSIS — H409 Unspecified glaucoma: Secondary | ICD-10-CM | POA: Diagnosis not present

## 2017-11-22 DIAGNOSIS — J9691 Respiratory failure, unspecified with hypoxia: Secondary | ICD-10-CM | POA: Diagnosis not present

## 2017-11-22 DIAGNOSIS — I10 Essential (primary) hypertension: Secondary | ICD-10-CM | POA: Diagnosis not present

## 2017-11-22 DIAGNOSIS — G2581 Restless legs syndrome: Secondary | ICD-10-CM | POA: Diagnosis not present

## 2017-11-23 DIAGNOSIS — J9691 Respiratory failure, unspecified with hypoxia: Secondary | ICD-10-CM | POA: Diagnosis not present

## 2017-11-23 DIAGNOSIS — G2581 Restless legs syndrome: Secondary | ICD-10-CM | POA: Diagnosis not present

## 2017-11-23 DIAGNOSIS — I10 Essential (primary) hypertension: Secondary | ICD-10-CM | POA: Diagnosis not present

## 2017-11-23 DIAGNOSIS — F339 Major depressive disorder, recurrent, unspecified: Secondary | ICD-10-CM | POA: Diagnosis not present

## 2017-11-23 DIAGNOSIS — H409 Unspecified glaucoma: Secondary | ICD-10-CM | POA: Diagnosis not present

## 2017-11-23 DIAGNOSIS — I2510 Atherosclerotic heart disease of native coronary artery without angina pectoris: Secondary | ICD-10-CM | POA: Diagnosis not present

## 2017-11-24 DIAGNOSIS — H409 Unspecified glaucoma: Secondary | ICD-10-CM | POA: Diagnosis not present

## 2017-11-24 DIAGNOSIS — J9691 Respiratory failure, unspecified with hypoxia: Secondary | ICD-10-CM | POA: Diagnosis not present

## 2017-11-24 DIAGNOSIS — F339 Major depressive disorder, recurrent, unspecified: Secondary | ICD-10-CM | POA: Diagnosis not present

## 2017-11-24 DIAGNOSIS — I2510 Atherosclerotic heart disease of native coronary artery without angina pectoris: Secondary | ICD-10-CM | POA: Diagnosis not present

## 2017-11-24 DIAGNOSIS — I10 Essential (primary) hypertension: Secondary | ICD-10-CM | POA: Diagnosis not present

## 2017-11-24 DIAGNOSIS — G2581 Restless legs syndrome: Secondary | ICD-10-CM | POA: Diagnosis not present

## 2017-11-29 DIAGNOSIS — H409 Unspecified glaucoma: Secondary | ICD-10-CM | POA: Diagnosis not present

## 2017-11-29 DIAGNOSIS — F339 Major depressive disorder, recurrent, unspecified: Secondary | ICD-10-CM | POA: Diagnosis not present

## 2017-11-29 DIAGNOSIS — I2510 Atherosclerotic heart disease of native coronary artery without angina pectoris: Secondary | ICD-10-CM | POA: Diagnosis not present

## 2017-11-29 DIAGNOSIS — J9691 Respiratory failure, unspecified with hypoxia: Secondary | ICD-10-CM | POA: Diagnosis not present

## 2017-11-29 DIAGNOSIS — I10 Essential (primary) hypertension: Secondary | ICD-10-CM | POA: Diagnosis not present

## 2017-11-29 DIAGNOSIS — G2581 Restless legs syndrome: Secondary | ICD-10-CM | POA: Diagnosis not present

## 2017-11-30 DIAGNOSIS — J9691 Respiratory failure, unspecified with hypoxia: Secondary | ICD-10-CM | POA: Diagnosis not present

## 2017-11-30 DIAGNOSIS — G2581 Restless legs syndrome: Secondary | ICD-10-CM | POA: Diagnosis not present

## 2017-11-30 DIAGNOSIS — H409 Unspecified glaucoma: Secondary | ICD-10-CM | POA: Diagnosis not present

## 2017-11-30 DIAGNOSIS — I10 Essential (primary) hypertension: Secondary | ICD-10-CM | POA: Diagnosis not present

## 2017-11-30 DIAGNOSIS — I2510 Atherosclerotic heart disease of native coronary artery without angina pectoris: Secondary | ICD-10-CM | POA: Diagnosis not present

## 2017-11-30 DIAGNOSIS — F339 Major depressive disorder, recurrent, unspecified: Secondary | ICD-10-CM | POA: Diagnosis not present

## 2017-12-02 DIAGNOSIS — G2581 Restless legs syndrome: Secondary | ICD-10-CM | POA: Diagnosis not present

## 2017-12-02 DIAGNOSIS — I10 Essential (primary) hypertension: Secondary | ICD-10-CM | POA: Diagnosis not present

## 2017-12-02 DIAGNOSIS — J9691 Respiratory failure, unspecified with hypoxia: Secondary | ICD-10-CM | POA: Diagnosis not present

## 2017-12-02 DIAGNOSIS — H409 Unspecified glaucoma: Secondary | ICD-10-CM | POA: Diagnosis not present

## 2017-12-02 DIAGNOSIS — I2510 Atherosclerotic heart disease of native coronary artery without angina pectoris: Secondary | ICD-10-CM | POA: Diagnosis not present

## 2017-12-02 DIAGNOSIS — F339 Major depressive disorder, recurrent, unspecified: Secondary | ICD-10-CM | POA: Diagnosis not present

## 2017-12-06 DIAGNOSIS — H409 Unspecified glaucoma: Secondary | ICD-10-CM | POA: Diagnosis not present

## 2017-12-06 DIAGNOSIS — I10 Essential (primary) hypertension: Secondary | ICD-10-CM | POA: Diagnosis not present

## 2017-12-06 DIAGNOSIS — G2581 Restless legs syndrome: Secondary | ICD-10-CM | POA: Diagnosis not present

## 2017-12-06 DIAGNOSIS — J9691 Respiratory failure, unspecified with hypoxia: Secondary | ICD-10-CM | POA: Diagnosis not present

## 2017-12-06 DIAGNOSIS — I2510 Atherosclerotic heart disease of native coronary artery without angina pectoris: Secondary | ICD-10-CM | POA: Diagnosis not present

## 2017-12-06 DIAGNOSIS — F339 Major depressive disorder, recurrent, unspecified: Secondary | ICD-10-CM | POA: Diagnosis not present

## 2017-12-08 ENCOUNTER — Other Ambulatory Visit: Payer: Self-pay | Admitting: Cardiology

## 2017-12-08 NOTE — Telephone Encounter (Signed)
Rx request sent to pharmacy.  

## 2017-12-09 DIAGNOSIS — I2510 Atherosclerotic heart disease of native coronary artery without angina pectoris: Secondary | ICD-10-CM | POA: Diagnosis not present

## 2017-12-09 DIAGNOSIS — J9691 Respiratory failure, unspecified with hypoxia: Secondary | ICD-10-CM | POA: Diagnosis not present

## 2017-12-09 DIAGNOSIS — F339 Major depressive disorder, recurrent, unspecified: Secondary | ICD-10-CM | POA: Diagnosis not present

## 2017-12-09 DIAGNOSIS — H409 Unspecified glaucoma: Secondary | ICD-10-CM | POA: Diagnosis not present

## 2017-12-09 DIAGNOSIS — I10 Essential (primary) hypertension: Secondary | ICD-10-CM | POA: Diagnosis not present

## 2017-12-09 DIAGNOSIS — G2581 Restless legs syndrome: Secondary | ICD-10-CM | POA: Diagnosis not present

## 2017-12-13 DIAGNOSIS — H409 Unspecified glaucoma: Secondary | ICD-10-CM | POA: Diagnosis not present

## 2017-12-13 DIAGNOSIS — K219 Gastro-esophageal reflux disease without esophagitis: Secondary | ICD-10-CM | POA: Diagnosis not present

## 2017-12-13 DIAGNOSIS — J9691 Respiratory failure, unspecified with hypoxia: Secondary | ICD-10-CM | POA: Diagnosis not present

## 2017-12-13 DIAGNOSIS — I10 Essential (primary) hypertension: Secondary | ICD-10-CM | POA: Diagnosis not present

## 2017-12-13 DIAGNOSIS — I2510 Atherosclerotic heart disease of native coronary artery without angina pectoris: Secondary | ICD-10-CM | POA: Diagnosis not present

## 2017-12-13 DIAGNOSIS — G2581 Restless legs syndrome: Secondary | ICD-10-CM | POA: Diagnosis not present

## 2017-12-13 DIAGNOSIS — F339 Major depressive disorder, recurrent, unspecified: Secondary | ICD-10-CM | POA: Diagnosis not present

## 2017-12-15 DIAGNOSIS — I2510 Atherosclerotic heart disease of native coronary artery without angina pectoris: Secondary | ICD-10-CM | POA: Diagnosis not present

## 2017-12-15 DIAGNOSIS — G2581 Restless legs syndrome: Secondary | ICD-10-CM | POA: Diagnosis not present

## 2017-12-15 DIAGNOSIS — J9691 Respiratory failure, unspecified with hypoxia: Secondary | ICD-10-CM | POA: Diagnosis not present

## 2017-12-15 DIAGNOSIS — I10 Essential (primary) hypertension: Secondary | ICD-10-CM | POA: Diagnosis not present

## 2017-12-15 DIAGNOSIS — H409 Unspecified glaucoma: Secondary | ICD-10-CM | POA: Diagnosis not present

## 2017-12-15 DIAGNOSIS — F339 Major depressive disorder, recurrent, unspecified: Secondary | ICD-10-CM | POA: Diagnosis not present

## 2017-12-16 DIAGNOSIS — J9691 Respiratory failure, unspecified with hypoxia: Secondary | ICD-10-CM | POA: Diagnosis not present

## 2017-12-16 DIAGNOSIS — I10 Essential (primary) hypertension: Secondary | ICD-10-CM | POA: Diagnosis not present

## 2017-12-16 DIAGNOSIS — G2581 Restless legs syndrome: Secondary | ICD-10-CM | POA: Diagnosis not present

## 2017-12-16 DIAGNOSIS — H409 Unspecified glaucoma: Secondary | ICD-10-CM | POA: Diagnosis not present

## 2017-12-16 DIAGNOSIS — F339 Major depressive disorder, recurrent, unspecified: Secondary | ICD-10-CM | POA: Diagnosis not present

## 2017-12-16 DIAGNOSIS — I2510 Atherosclerotic heart disease of native coronary artery without angina pectoris: Secondary | ICD-10-CM | POA: Diagnosis not present

## 2017-12-20 DIAGNOSIS — I10 Essential (primary) hypertension: Secondary | ICD-10-CM | POA: Diagnosis not present

## 2017-12-20 DIAGNOSIS — I2510 Atherosclerotic heart disease of native coronary artery without angina pectoris: Secondary | ICD-10-CM | POA: Diagnosis not present

## 2017-12-20 DIAGNOSIS — G2581 Restless legs syndrome: Secondary | ICD-10-CM | POA: Diagnosis not present

## 2017-12-20 DIAGNOSIS — H409 Unspecified glaucoma: Secondary | ICD-10-CM | POA: Diagnosis not present

## 2017-12-20 DIAGNOSIS — J9691 Respiratory failure, unspecified with hypoxia: Secondary | ICD-10-CM | POA: Diagnosis not present

## 2017-12-20 DIAGNOSIS — F339 Major depressive disorder, recurrent, unspecified: Secondary | ICD-10-CM | POA: Diagnosis not present

## 2017-12-22 DIAGNOSIS — J9691 Respiratory failure, unspecified with hypoxia: Secondary | ICD-10-CM | POA: Diagnosis not present

## 2017-12-22 DIAGNOSIS — F339 Major depressive disorder, recurrent, unspecified: Secondary | ICD-10-CM | POA: Diagnosis not present

## 2017-12-22 DIAGNOSIS — I10 Essential (primary) hypertension: Secondary | ICD-10-CM | POA: Diagnosis not present

## 2017-12-22 DIAGNOSIS — G2581 Restless legs syndrome: Secondary | ICD-10-CM | POA: Diagnosis not present

## 2017-12-22 DIAGNOSIS — H409 Unspecified glaucoma: Secondary | ICD-10-CM | POA: Diagnosis not present

## 2017-12-22 DIAGNOSIS — I2510 Atherosclerotic heart disease of native coronary artery without angina pectoris: Secondary | ICD-10-CM | POA: Diagnosis not present

## 2017-12-23 DIAGNOSIS — I2510 Atherosclerotic heart disease of native coronary artery without angina pectoris: Secondary | ICD-10-CM | POA: Diagnosis not present

## 2017-12-23 DIAGNOSIS — G2581 Restless legs syndrome: Secondary | ICD-10-CM | POA: Diagnosis not present

## 2017-12-23 DIAGNOSIS — H409 Unspecified glaucoma: Secondary | ICD-10-CM | POA: Diagnosis not present

## 2017-12-23 DIAGNOSIS — I10 Essential (primary) hypertension: Secondary | ICD-10-CM | POA: Diagnosis not present

## 2017-12-23 DIAGNOSIS — J9691 Respiratory failure, unspecified with hypoxia: Secondary | ICD-10-CM | POA: Diagnosis not present

## 2017-12-23 DIAGNOSIS — F339 Major depressive disorder, recurrent, unspecified: Secondary | ICD-10-CM | POA: Diagnosis not present

## 2017-12-26 DIAGNOSIS — H409 Unspecified glaucoma: Secondary | ICD-10-CM | POA: Diagnosis not present

## 2017-12-26 DIAGNOSIS — F339 Major depressive disorder, recurrent, unspecified: Secondary | ICD-10-CM | POA: Diagnosis not present

## 2017-12-26 DIAGNOSIS — I2510 Atherosclerotic heart disease of native coronary artery without angina pectoris: Secondary | ICD-10-CM | POA: Diagnosis not present

## 2017-12-26 DIAGNOSIS — G2581 Restless legs syndrome: Secondary | ICD-10-CM | POA: Diagnosis not present

## 2017-12-26 DIAGNOSIS — I10 Essential (primary) hypertension: Secondary | ICD-10-CM | POA: Diagnosis not present

## 2017-12-26 DIAGNOSIS — J9691 Respiratory failure, unspecified with hypoxia: Secondary | ICD-10-CM | POA: Diagnosis not present

## 2017-12-27 DIAGNOSIS — I10 Essential (primary) hypertension: Secondary | ICD-10-CM | POA: Diagnosis not present

## 2017-12-27 DIAGNOSIS — H409 Unspecified glaucoma: Secondary | ICD-10-CM | POA: Diagnosis not present

## 2017-12-27 DIAGNOSIS — G2581 Restless legs syndrome: Secondary | ICD-10-CM | POA: Diagnosis not present

## 2017-12-27 DIAGNOSIS — I2510 Atherosclerotic heart disease of native coronary artery without angina pectoris: Secondary | ICD-10-CM | POA: Diagnosis not present

## 2017-12-27 DIAGNOSIS — J9691 Respiratory failure, unspecified with hypoxia: Secondary | ICD-10-CM | POA: Diagnosis not present

## 2017-12-27 DIAGNOSIS — F339 Major depressive disorder, recurrent, unspecified: Secondary | ICD-10-CM | POA: Diagnosis not present

## 2017-12-28 DIAGNOSIS — F339 Major depressive disorder, recurrent, unspecified: Secondary | ICD-10-CM | POA: Diagnosis not present

## 2017-12-28 DIAGNOSIS — H409 Unspecified glaucoma: Secondary | ICD-10-CM | POA: Diagnosis not present

## 2017-12-28 DIAGNOSIS — I10 Essential (primary) hypertension: Secondary | ICD-10-CM | POA: Diagnosis not present

## 2017-12-28 DIAGNOSIS — I2510 Atherosclerotic heart disease of native coronary artery without angina pectoris: Secondary | ICD-10-CM | POA: Diagnosis not present

## 2017-12-28 DIAGNOSIS — G2581 Restless legs syndrome: Secondary | ICD-10-CM | POA: Diagnosis not present

## 2017-12-28 DIAGNOSIS — J9691 Respiratory failure, unspecified with hypoxia: Secondary | ICD-10-CM | POA: Diagnosis not present

## 2017-12-30 DIAGNOSIS — G2581 Restless legs syndrome: Secondary | ICD-10-CM | POA: Diagnosis not present

## 2017-12-30 DIAGNOSIS — H409 Unspecified glaucoma: Secondary | ICD-10-CM | POA: Diagnosis not present

## 2017-12-30 DIAGNOSIS — F339 Major depressive disorder, recurrent, unspecified: Secondary | ICD-10-CM | POA: Diagnosis not present

## 2017-12-30 DIAGNOSIS — I10 Essential (primary) hypertension: Secondary | ICD-10-CM | POA: Diagnosis not present

## 2017-12-30 DIAGNOSIS — J9691 Respiratory failure, unspecified with hypoxia: Secondary | ICD-10-CM | POA: Diagnosis not present

## 2017-12-30 DIAGNOSIS — I2510 Atherosclerotic heart disease of native coronary artery without angina pectoris: Secondary | ICD-10-CM | POA: Diagnosis not present

## 2018-01-03 DIAGNOSIS — I2510 Atherosclerotic heart disease of native coronary artery without angina pectoris: Secondary | ICD-10-CM | POA: Diagnosis not present

## 2018-01-03 DIAGNOSIS — G2581 Restless legs syndrome: Secondary | ICD-10-CM | POA: Diagnosis not present

## 2018-01-03 DIAGNOSIS — H409 Unspecified glaucoma: Secondary | ICD-10-CM | POA: Diagnosis not present

## 2018-01-03 DIAGNOSIS — F339 Major depressive disorder, recurrent, unspecified: Secondary | ICD-10-CM | POA: Diagnosis not present

## 2018-01-03 DIAGNOSIS — J9691 Respiratory failure, unspecified with hypoxia: Secondary | ICD-10-CM | POA: Diagnosis not present

## 2018-01-03 DIAGNOSIS — I10 Essential (primary) hypertension: Secondary | ICD-10-CM | POA: Diagnosis not present

## 2018-01-05 DIAGNOSIS — H409 Unspecified glaucoma: Secondary | ICD-10-CM | POA: Diagnosis not present

## 2018-01-05 DIAGNOSIS — I10 Essential (primary) hypertension: Secondary | ICD-10-CM | POA: Diagnosis not present

## 2018-01-05 DIAGNOSIS — G2581 Restless legs syndrome: Secondary | ICD-10-CM | POA: Diagnosis not present

## 2018-01-05 DIAGNOSIS — J9691 Respiratory failure, unspecified with hypoxia: Secondary | ICD-10-CM | POA: Diagnosis not present

## 2018-01-05 DIAGNOSIS — I2510 Atherosclerotic heart disease of native coronary artery without angina pectoris: Secondary | ICD-10-CM | POA: Diagnosis not present

## 2018-01-05 DIAGNOSIS — F339 Major depressive disorder, recurrent, unspecified: Secondary | ICD-10-CM | POA: Diagnosis not present

## 2018-01-10 DIAGNOSIS — G2581 Restless legs syndrome: Secondary | ICD-10-CM | POA: Diagnosis not present

## 2018-01-10 DIAGNOSIS — I2510 Atherosclerotic heart disease of native coronary artery without angina pectoris: Secondary | ICD-10-CM | POA: Diagnosis not present

## 2018-01-10 DIAGNOSIS — I10 Essential (primary) hypertension: Secondary | ICD-10-CM | POA: Diagnosis not present

## 2018-01-10 DIAGNOSIS — H409 Unspecified glaucoma: Secondary | ICD-10-CM | POA: Diagnosis not present

## 2018-01-10 DIAGNOSIS — F339 Major depressive disorder, recurrent, unspecified: Secondary | ICD-10-CM | POA: Diagnosis not present

## 2018-01-10 DIAGNOSIS — J9691 Respiratory failure, unspecified with hypoxia: Secondary | ICD-10-CM | POA: Diagnosis not present

## 2018-01-11 DIAGNOSIS — H409 Unspecified glaucoma: Secondary | ICD-10-CM | POA: Diagnosis not present

## 2018-01-11 DIAGNOSIS — F339 Major depressive disorder, recurrent, unspecified: Secondary | ICD-10-CM | POA: Diagnosis not present

## 2018-01-11 DIAGNOSIS — I2510 Atherosclerotic heart disease of native coronary artery without angina pectoris: Secondary | ICD-10-CM | POA: Diagnosis not present

## 2018-01-11 DIAGNOSIS — G2581 Restless legs syndrome: Secondary | ICD-10-CM | POA: Diagnosis not present

## 2018-01-11 DIAGNOSIS — I10 Essential (primary) hypertension: Secondary | ICD-10-CM | POA: Diagnosis not present

## 2018-01-11 DIAGNOSIS — J9691 Respiratory failure, unspecified with hypoxia: Secondary | ICD-10-CM | POA: Diagnosis not present

## 2018-01-12 DIAGNOSIS — I2510 Atherosclerotic heart disease of native coronary artery without angina pectoris: Secondary | ICD-10-CM | POA: Diagnosis not present

## 2018-01-12 DIAGNOSIS — H409 Unspecified glaucoma: Secondary | ICD-10-CM | POA: Diagnosis not present

## 2018-01-12 DIAGNOSIS — J9691 Respiratory failure, unspecified with hypoxia: Secondary | ICD-10-CM | POA: Diagnosis not present

## 2018-01-12 DIAGNOSIS — F339 Major depressive disorder, recurrent, unspecified: Secondary | ICD-10-CM | POA: Diagnosis not present

## 2018-01-12 DIAGNOSIS — I10 Essential (primary) hypertension: Secondary | ICD-10-CM | POA: Diagnosis not present

## 2018-01-12 DIAGNOSIS — G2581 Restless legs syndrome: Secondary | ICD-10-CM | POA: Diagnosis not present

## 2018-01-13 DIAGNOSIS — J9691 Respiratory failure, unspecified with hypoxia: Secondary | ICD-10-CM | POA: Diagnosis not present

## 2018-01-13 DIAGNOSIS — G2581 Restless legs syndrome: Secondary | ICD-10-CM | POA: Diagnosis not present

## 2018-01-13 DIAGNOSIS — F339 Major depressive disorder, recurrent, unspecified: Secondary | ICD-10-CM | POA: Diagnosis not present

## 2018-01-13 DIAGNOSIS — I10 Essential (primary) hypertension: Secondary | ICD-10-CM | POA: Diagnosis not present

## 2018-01-13 DIAGNOSIS — H409 Unspecified glaucoma: Secondary | ICD-10-CM | POA: Diagnosis not present

## 2018-01-13 DIAGNOSIS — I2510 Atherosclerotic heart disease of native coronary artery without angina pectoris: Secondary | ICD-10-CM | POA: Diagnosis not present

## 2018-01-13 DIAGNOSIS — K219 Gastro-esophageal reflux disease without esophagitis: Secondary | ICD-10-CM | POA: Diagnosis not present

## 2018-01-17 DIAGNOSIS — I2510 Atherosclerotic heart disease of native coronary artery without angina pectoris: Secondary | ICD-10-CM | POA: Diagnosis not present

## 2018-01-17 DIAGNOSIS — H409 Unspecified glaucoma: Secondary | ICD-10-CM | POA: Diagnosis not present

## 2018-01-17 DIAGNOSIS — J9691 Respiratory failure, unspecified with hypoxia: Secondary | ICD-10-CM | POA: Diagnosis not present

## 2018-01-17 DIAGNOSIS — G2581 Restless legs syndrome: Secondary | ICD-10-CM | POA: Diagnosis not present

## 2018-01-17 DIAGNOSIS — I10 Essential (primary) hypertension: Secondary | ICD-10-CM | POA: Diagnosis not present

## 2018-01-17 DIAGNOSIS — F339 Major depressive disorder, recurrent, unspecified: Secondary | ICD-10-CM | POA: Diagnosis not present

## 2018-01-20 DIAGNOSIS — I2510 Atherosclerotic heart disease of native coronary artery without angina pectoris: Secondary | ICD-10-CM | POA: Diagnosis not present

## 2018-01-20 DIAGNOSIS — G2581 Restless legs syndrome: Secondary | ICD-10-CM | POA: Diagnosis not present

## 2018-01-20 DIAGNOSIS — I10 Essential (primary) hypertension: Secondary | ICD-10-CM | POA: Diagnosis not present

## 2018-01-20 DIAGNOSIS — H409 Unspecified glaucoma: Secondary | ICD-10-CM | POA: Diagnosis not present

## 2018-01-20 DIAGNOSIS — J9691 Respiratory failure, unspecified with hypoxia: Secondary | ICD-10-CM | POA: Diagnosis not present

## 2018-01-20 DIAGNOSIS — F339 Major depressive disorder, recurrent, unspecified: Secondary | ICD-10-CM | POA: Diagnosis not present

## 2018-01-24 DIAGNOSIS — I10 Essential (primary) hypertension: Secondary | ICD-10-CM | POA: Diagnosis not present

## 2018-01-24 DIAGNOSIS — H409 Unspecified glaucoma: Secondary | ICD-10-CM | POA: Diagnosis not present

## 2018-01-24 DIAGNOSIS — F339 Major depressive disorder, recurrent, unspecified: Secondary | ICD-10-CM | POA: Diagnosis not present

## 2018-01-24 DIAGNOSIS — J9691 Respiratory failure, unspecified with hypoxia: Secondary | ICD-10-CM | POA: Diagnosis not present

## 2018-01-24 DIAGNOSIS — I2510 Atherosclerotic heart disease of native coronary artery without angina pectoris: Secondary | ICD-10-CM | POA: Diagnosis not present

## 2018-01-24 DIAGNOSIS — G2581 Restless legs syndrome: Secondary | ICD-10-CM | POA: Diagnosis not present

## 2018-01-27 DIAGNOSIS — I10 Essential (primary) hypertension: Secondary | ICD-10-CM | POA: Diagnosis not present

## 2018-01-27 DIAGNOSIS — H409 Unspecified glaucoma: Secondary | ICD-10-CM | POA: Diagnosis not present

## 2018-01-27 DIAGNOSIS — I2510 Atherosclerotic heart disease of native coronary artery without angina pectoris: Secondary | ICD-10-CM | POA: Diagnosis not present

## 2018-01-27 DIAGNOSIS — J9691 Respiratory failure, unspecified with hypoxia: Secondary | ICD-10-CM | POA: Diagnosis not present

## 2018-01-27 DIAGNOSIS — F339 Major depressive disorder, recurrent, unspecified: Secondary | ICD-10-CM | POA: Diagnosis not present

## 2018-01-27 DIAGNOSIS — G2581 Restless legs syndrome: Secondary | ICD-10-CM | POA: Diagnosis not present

## 2018-01-31 DIAGNOSIS — F339 Major depressive disorder, recurrent, unspecified: Secondary | ICD-10-CM | POA: Diagnosis not present

## 2018-01-31 DIAGNOSIS — I2510 Atherosclerotic heart disease of native coronary artery without angina pectoris: Secondary | ICD-10-CM | POA: Diagnosis not present

## 2018-01-31 DIAGNOSIS — I10 Essential (primary) hypertension: Secondary | ICD-10-CM | POA: Diagnosis not present

## 2018-01-31 DIAGNOSIS — H409 Unspecified glaucoma: Secondary | ICD-10-CM | POA: Diagnosis not present

## 2018-01-31 DIAGNOSIS — J9691 Respiratory failure, unspecified with hypoxia: Secondary | ICD-10-CM | POA: Diagnosis not present

## 2018-01-31 DIAGNOSIS — G2581 Restless legs syndrome: Secondary | ICD-10-CM | POA: Diagnosis not present

## 2018-02-02 DIAGNOSIS — I10 Essential (primary) hypertension: Secondary | ICD-10-CM | POA: Diagnosis not present

## 2018-02-02 DIAGNOSIS — J9691 Respiratory failure, unspecified with hypoxia: Secondary | ICD-10-CM | POA: Diagnosis not present

## 2018-02-02 DIAGNOSIS — I2510 Atherosclerotic heart disease of native coronary artery without angina pectoris: Secondary | ICD-10-CM | POA: Diagnosis not present

## 2018-02-02 DIAGNOSIS — G2581 Restless legs syndrome: Secondary | ICD-10-CM | POA: Diagnosis not present

## 2018-02-02 DIAGNOSIS — F339 Major depressive disorder, recurrent, unspecified: Secondary | ICD-10-CM | POA: Diagnosis not present

## 2018-02-02 DIAGNOSIS — H409 Unspecified glaucoma: Secondary | ICD-10-CM | POA: Diagnosis not present

## 2018-02-03 DIAGNOSIS — I2510 Atherosclerotic heart disease of native coronary artery without angina pectoris: Secondary | ICD-10-CM | POA: Diagnosis not present

## 2018-02-03 DIAGNOSIS — H409 Unspecified glaucoma: Secondary | ICD-10-CM | POA: Diagnosis not present

## 2018-02-03 DIAGNOSIS — J9691 Respiratory failure, unspecified with hypoxia: Secondary | ICD-10-CM | POA: Diagnosis not present

## 2018-02-03 DIAGNOSIS — G2581 Restless legs syndrome: Secondary | ICD-10-CM | POA: Diagnosis not present

## 2018-02-03 DIAGNOSIS — I10 Essential (primary) hypertension: Secondary | ICD-10-CM | POA: Diagnosis not present

## 2018-02-03 DIAGNOSIS — F339 Major depressive disorder, recurrent, unspecified: Secondary | ICD-10-CM | POA: Diagnosis not present

## 2018-02-07 DIAGNOSIS — I2510 Atherosclerotic heart disease of native coronary artery without angina pectoris: Secondary | ICD-10-CM | POA: Diagnosis not present

## 2018-02-07 DIAGNOSIS — J9691 Respiratory failure, unspecified with hypoxia: Secondary | ICD-10-CM | POA: Diagnosis not present

## 2018-02-07 DIAGNOSIS — I10 Essential (primary) hypertension: Secondary | ICD-10-CM | POA: Diagnosis not present

## 2018-02-07 DIAGNOSIS — G2581 Restless legs syndrome: Secondary | ICD-10-CM | POA: Diagnosis not present

## 2018-02-07 DIAGNOSIS — F339 Major depressive disorder, recurrent, unspecified: Secondary | ICD-10-CM | POA: Diagnosis not present

## 2018-02-07 DIAGNOSIS — H409 Unspecified glaucoma: Secondary | ICD-10-CM | POA: Diagnosis not present

## 2018-02-10 DIAGNOSIS — G2581 Restless legs syndrome: Secondary | ICD-10-CM | POA: Diagnosis not present

## 2018-02-10 DIAGNOSIS — H409 Unspecified glaucoma: Secondary | ICD-10-CM | POA: Diagnosis not present

## 2018-02-10 DIAGNOSIS — I2510 Atherosclerotic heart disease of native coronary artery without angina pectoris: Secondary | ICD-10-CM | POA: Diagnosis not present

## 2018-02-10 DIAGNOSIS — F339 Major depressive disorder, recurrent, unspecified: Secondary | ICD-10-CM | POA: Diagnosis not present

## 2018-02-10 DIAGNOSIS — J9691 Respiratory failure, unspecified with hypoxia: Secondary | ICD-10-CM | POA: Diagnosis not present

## 2018-02-10 DIAGNOSIS — I10 Essential (primary) hypertension: Secondary | ICD-10-CM | POA: Diagnosis not present

## 2018-05-14 DEATH — deceased

## 2018-09-18 IMAGING — DX DG CHEST 2V
2 series · 2 of 2 positions shown · non-contrast
Comparison: 01/08/2016

CLINICAL DATA: Shortness of breath, diarrhea, weakness

EXAM:
CHEST  2 VIEW

[chest lat]
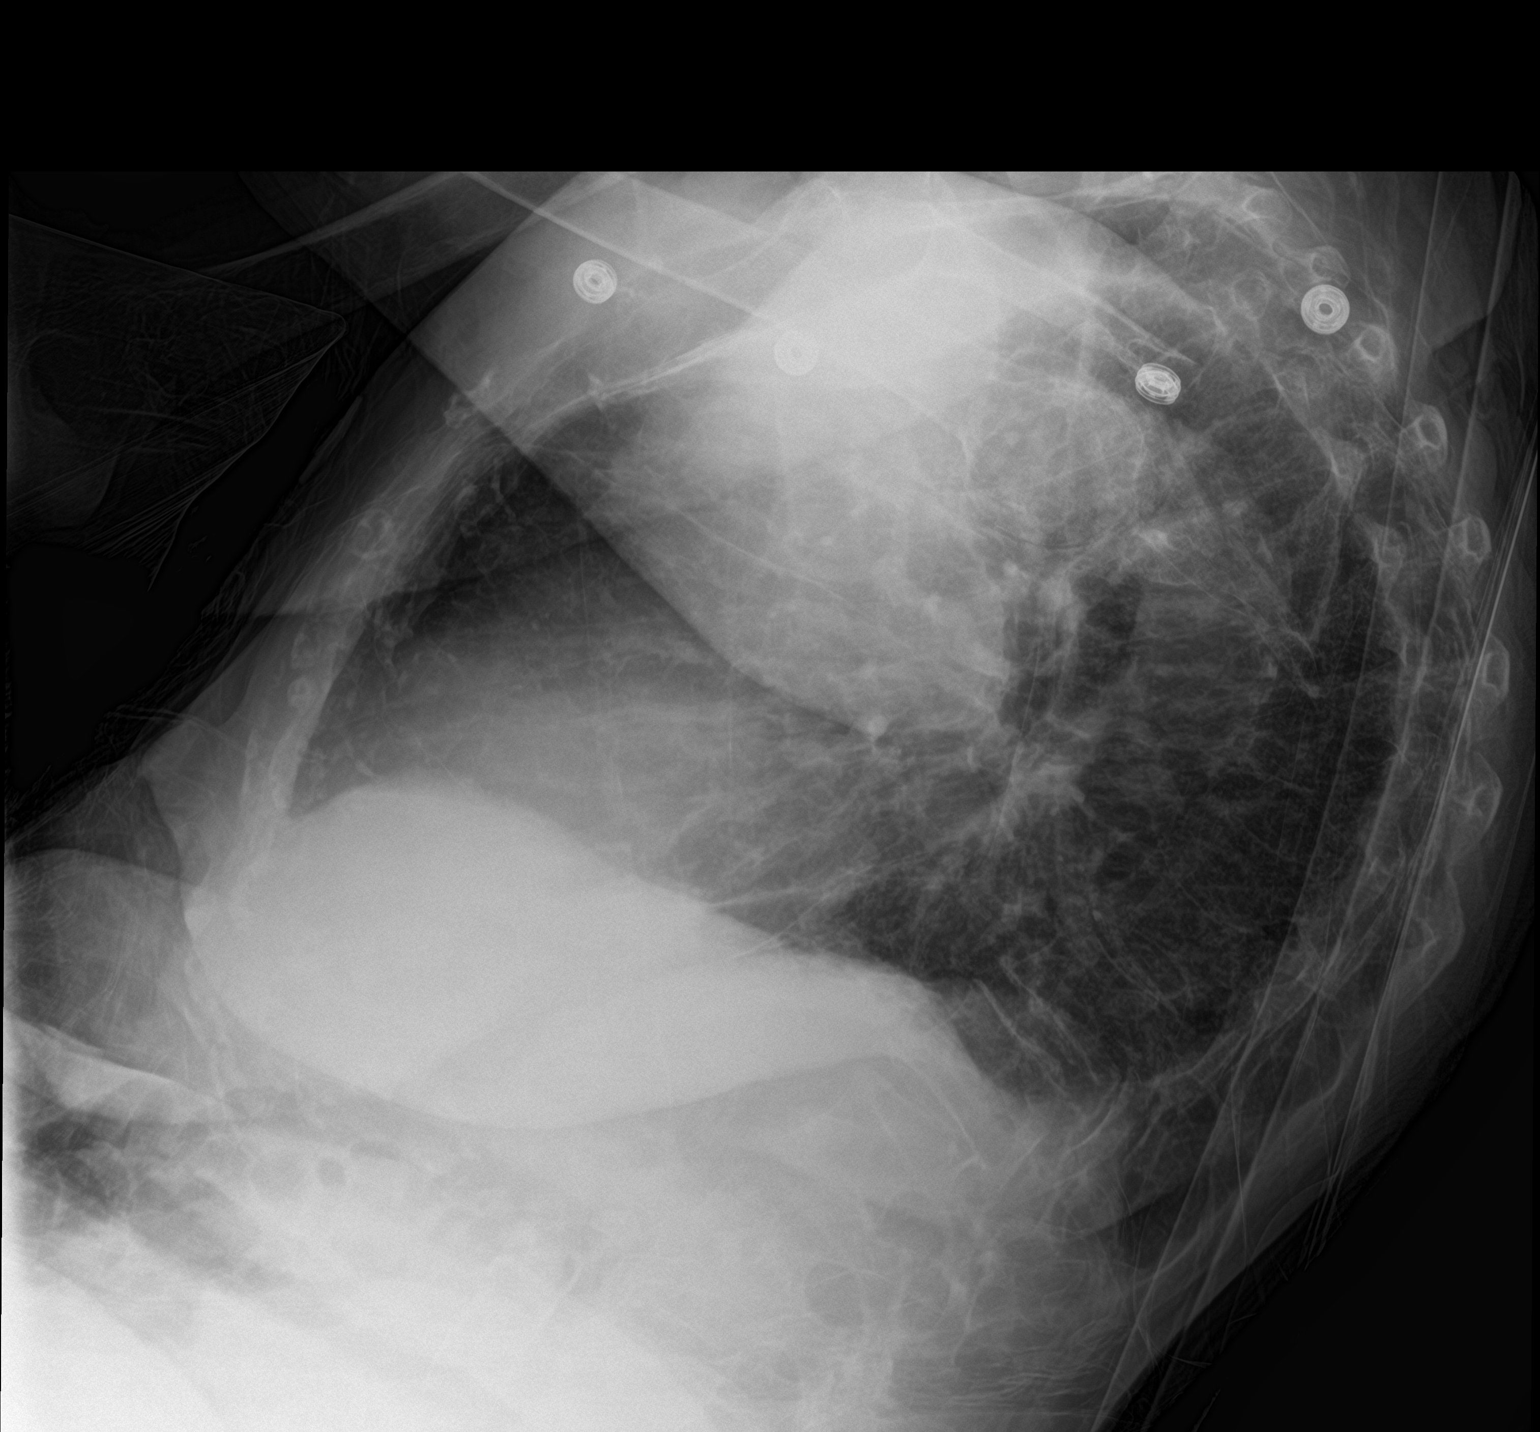

[chest ap]
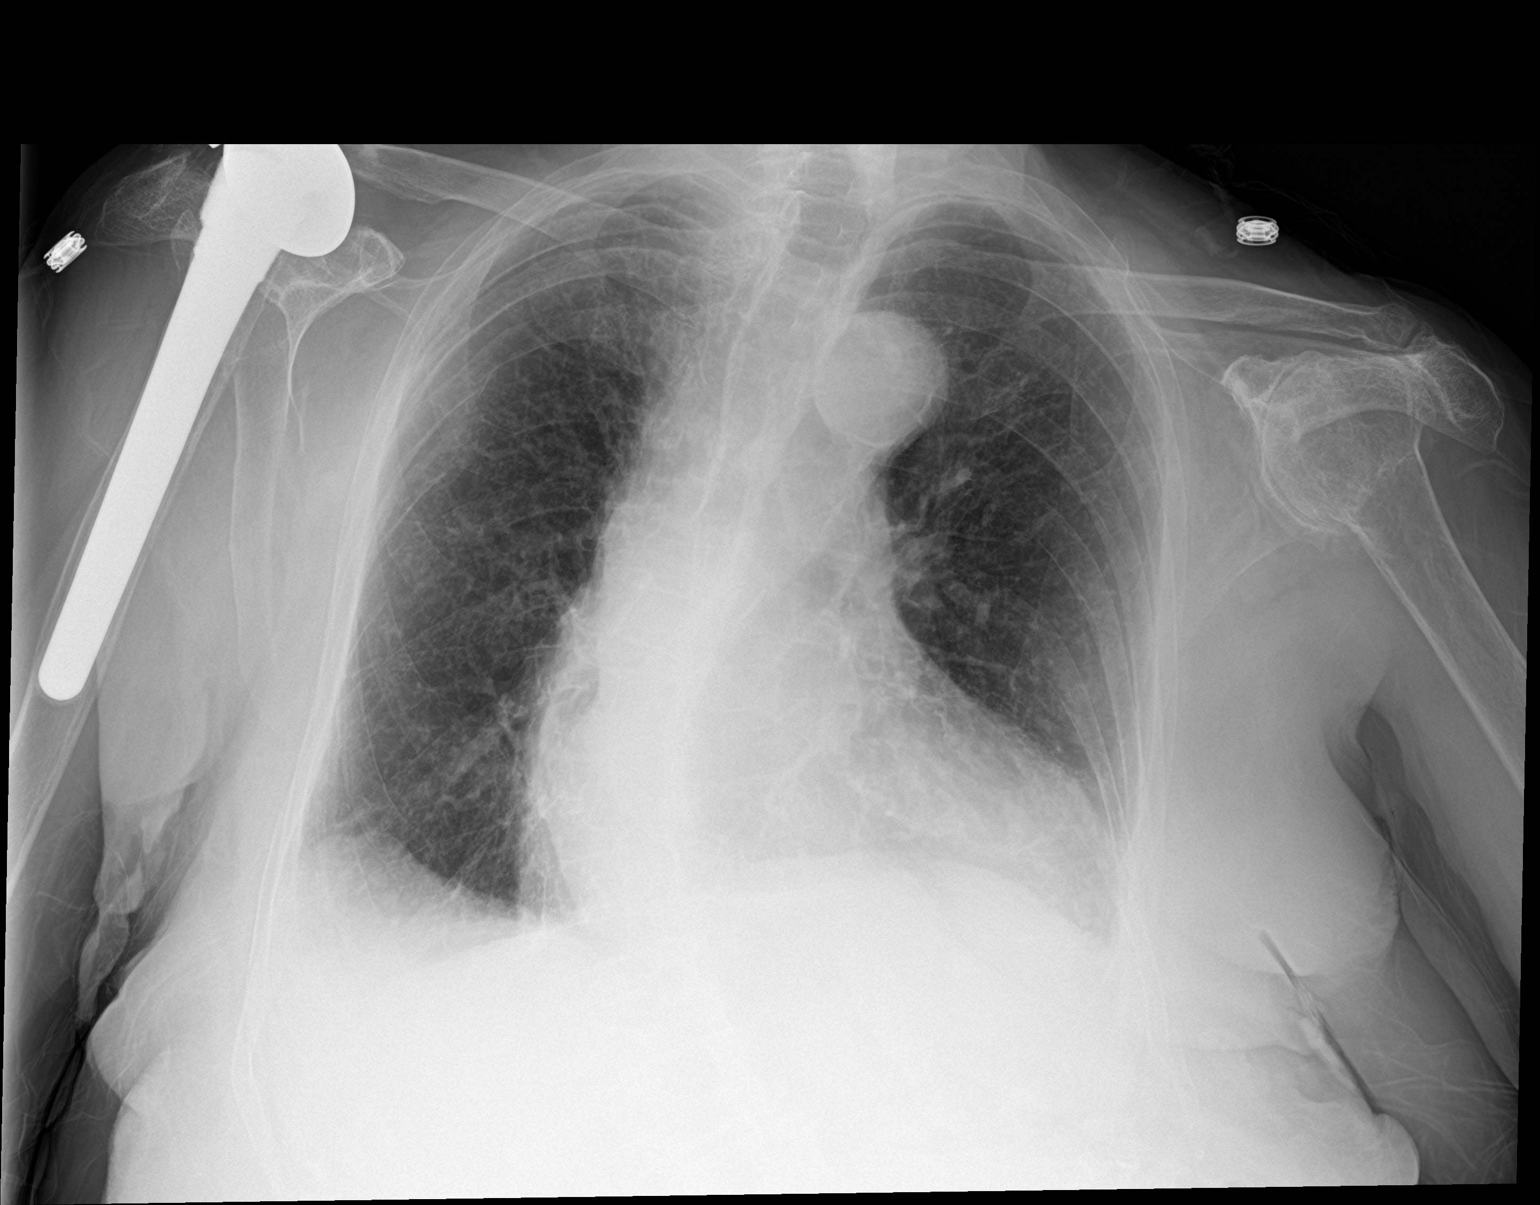

[2 of 2 positions shown; findings below may reference images not displayed]

FINDINGS: Chronic interstitial markings. No focal consolidation. No pleural
effusion or pneumothorax.

The heart is top-normal in size.

Right shoulder arthroplasty. Degenerative changes of the left
shoulder.
IMPRESSION: No evidence of acute cardiopulmonary disease.

## 2018-09-27 IMAGING — DX DG CHEST 2V
2 series · 2 of 2 positions shown · non-contrast
Comparison: 03/06/2016

CLINICAL DATA: Increasing weakness.  Altered mental status.

EXAM:
CHEST  2 VIEW

[chest pa]
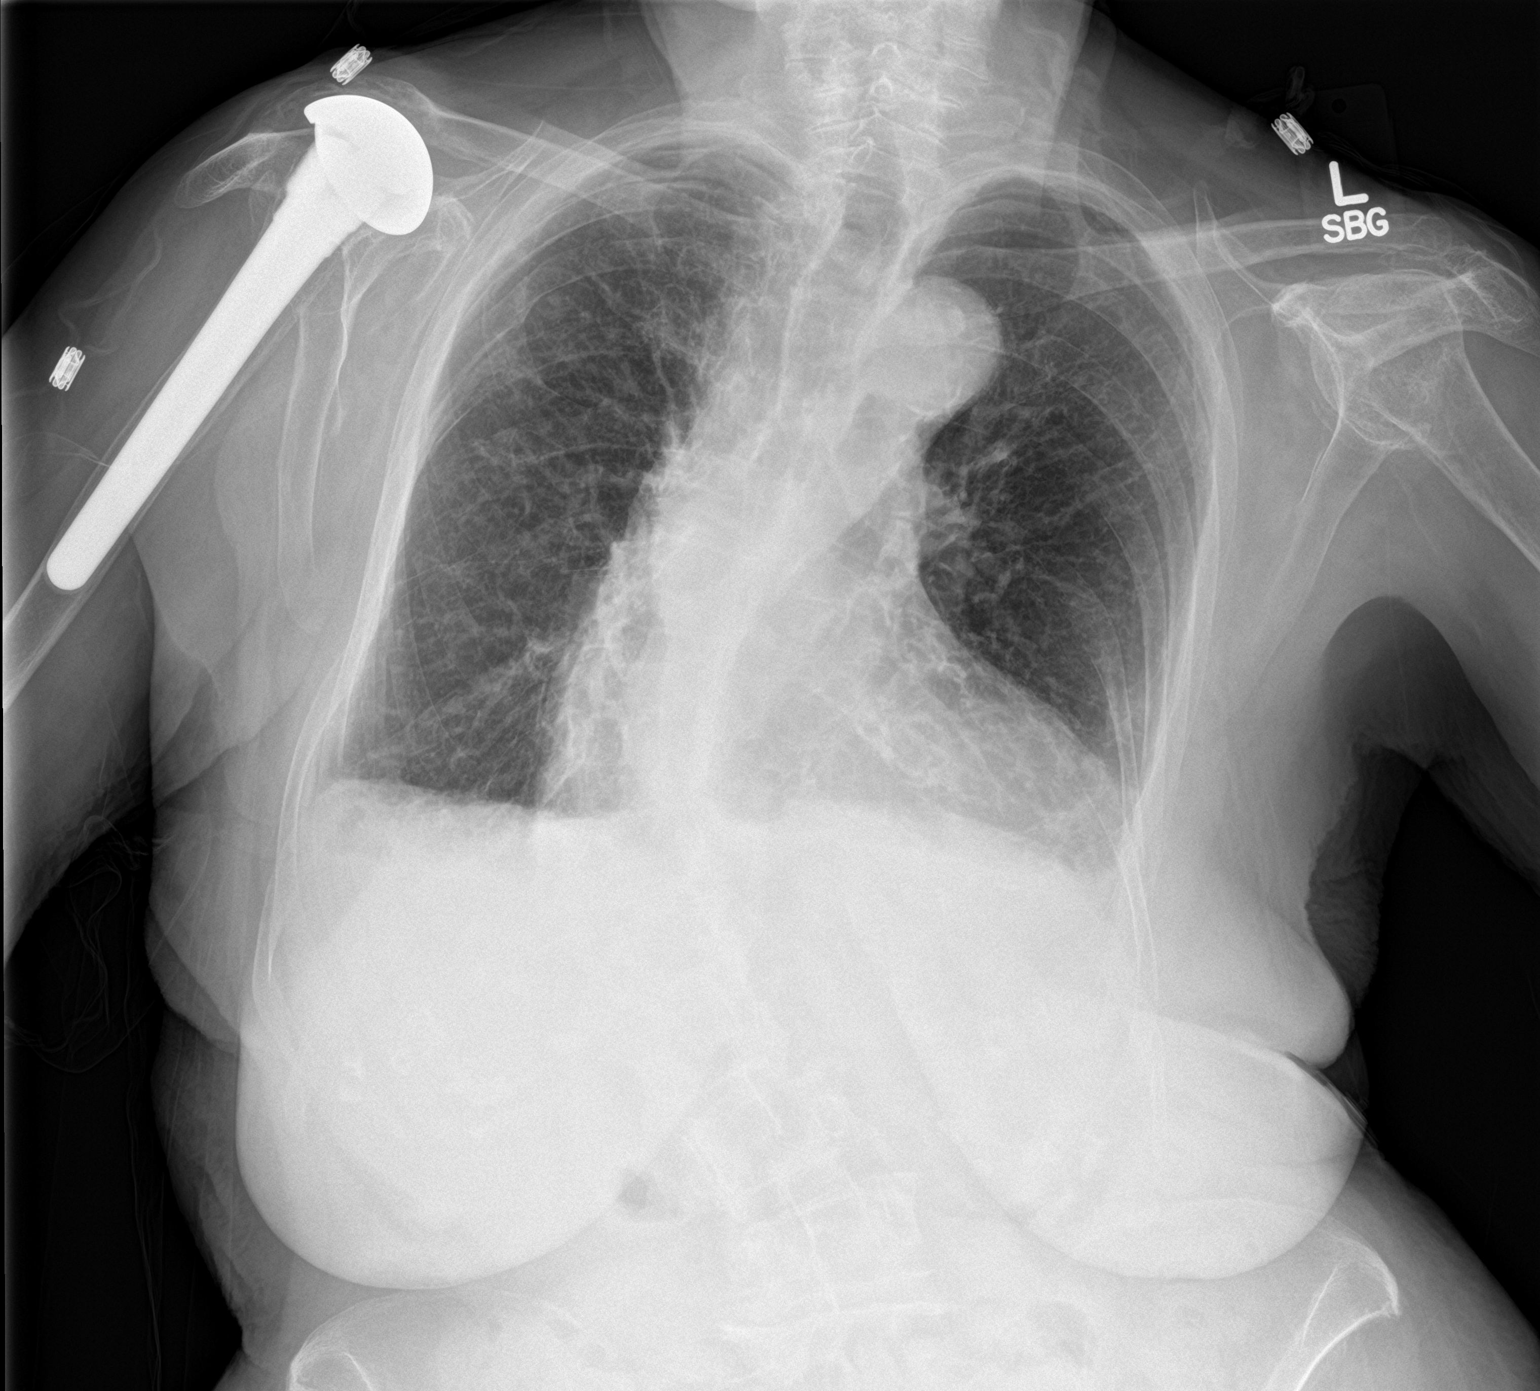

[chest lat]
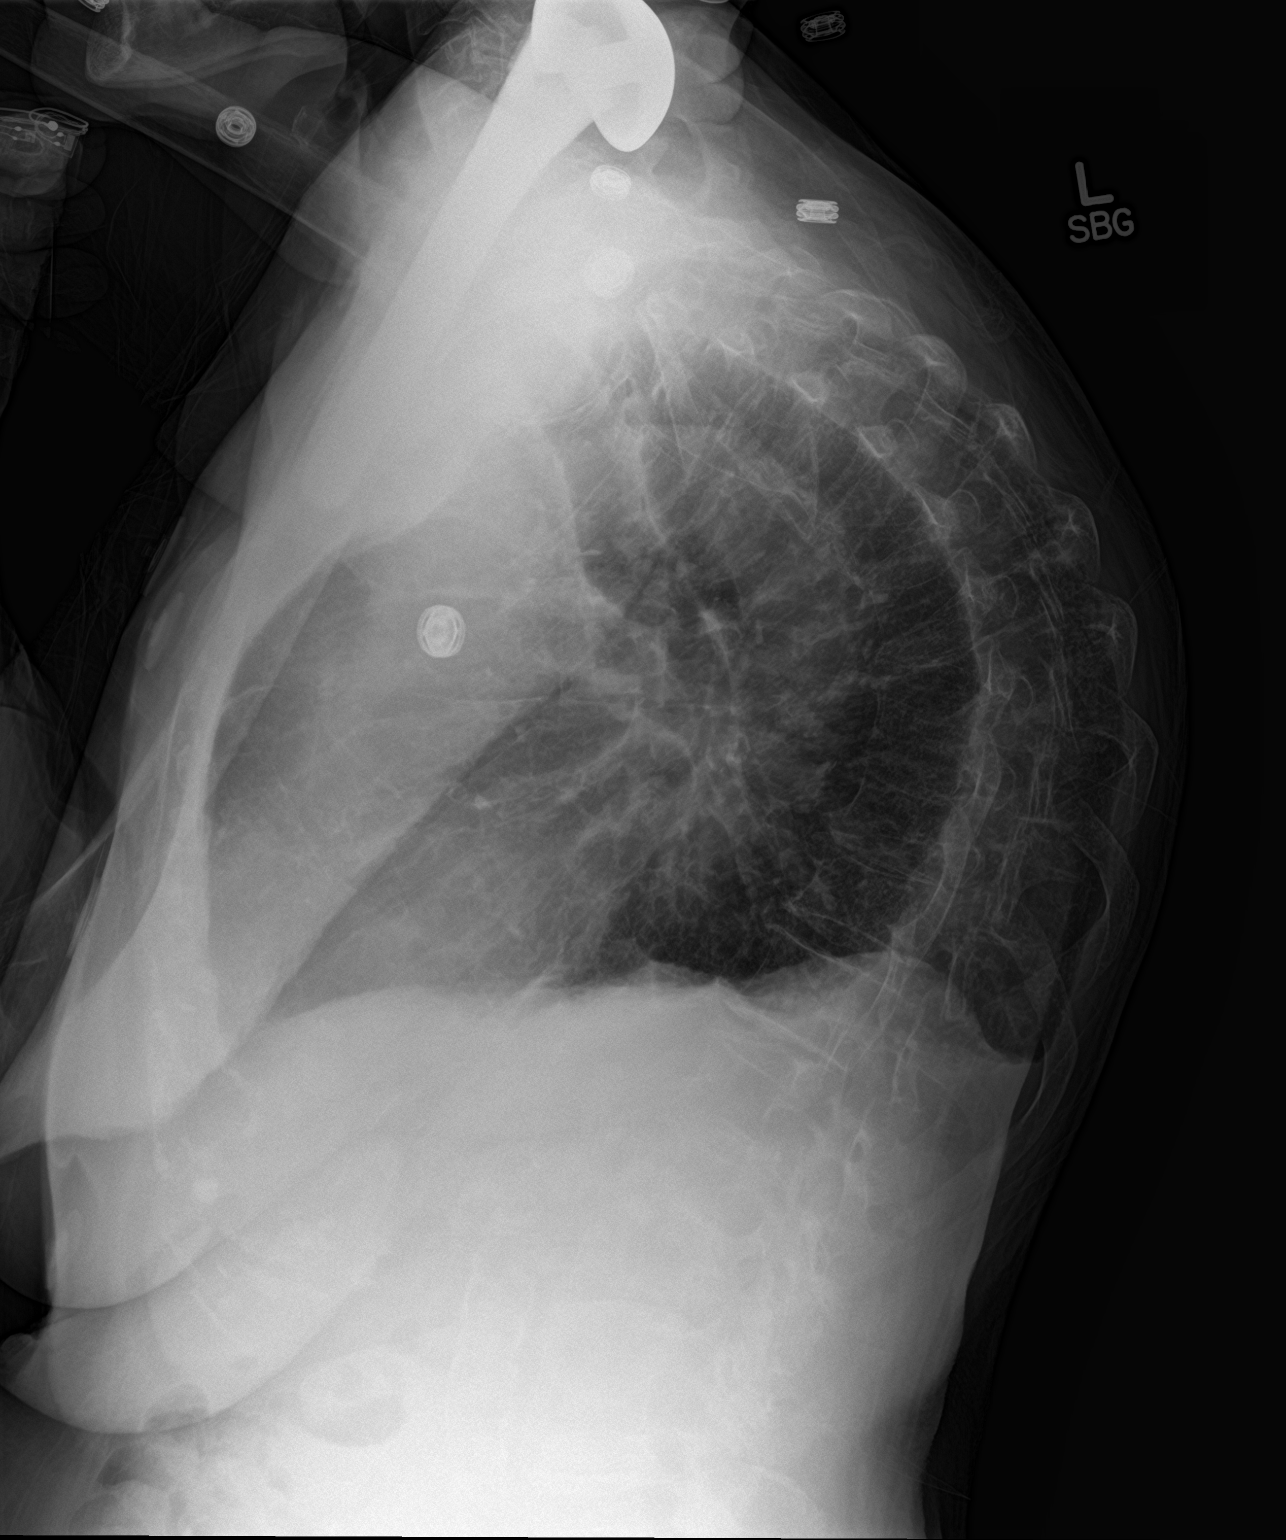

[2 of 2 positions shown; findings below may reference images not displayed]

FINDINGS: Cardiomegaly. The lungs are hyperinflated with flattening of the
diaphragm consistent with COPD. Marked chronic accentuation of the
thoracic kyphosis. Chronic peribronchial thickening and interstitial
accentuation.

Tiny new bilateral effusions.

Chronic thoracolumbar scoliosis.  Right proximal humeral prosthesis.
IMPRESSION: New tiny bilateral pleural effusions.  COPD.  Chronic cardiomegaly.

## 2020-02-19 IMAGING — CR DG CHEST 2V
2 series · 3 of 3 positions shown · non-contrast
Comparison: Radiographs April 22, 2017.

CLINICAL DATA: Shortness of breath.

EXAM:
CHEST - 2 VIEW

[chest lat]
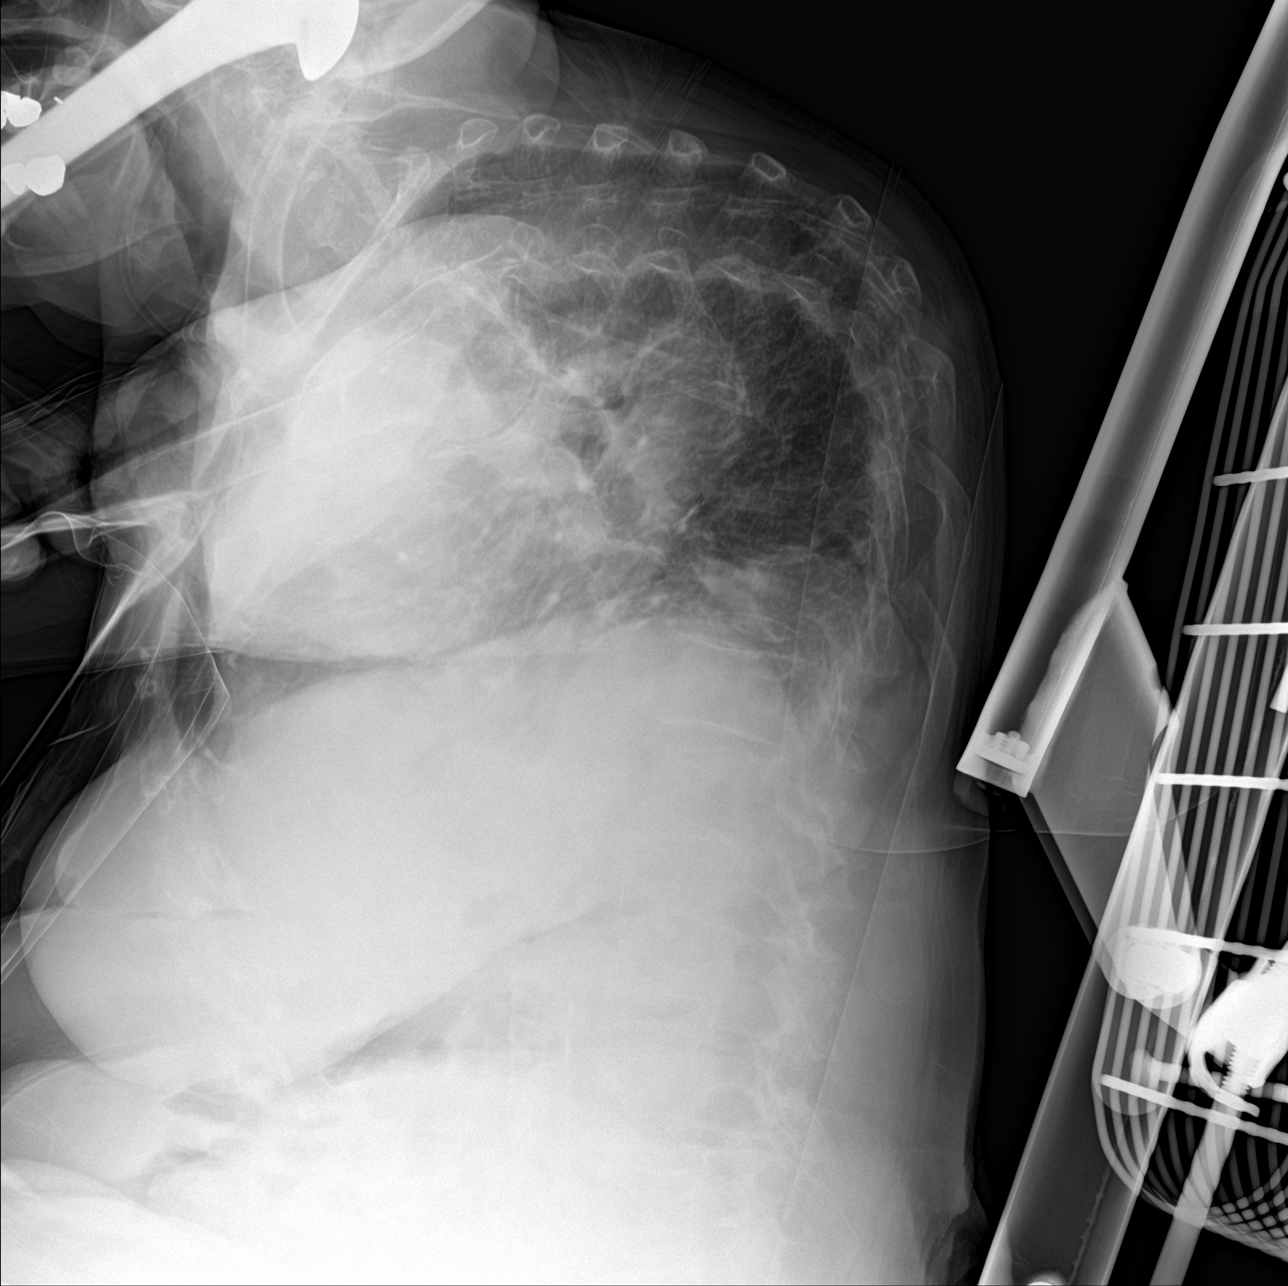

[Series 3: chest ap · 0.14mm/px · 2 of 2 slices shown]
[im 1/2]
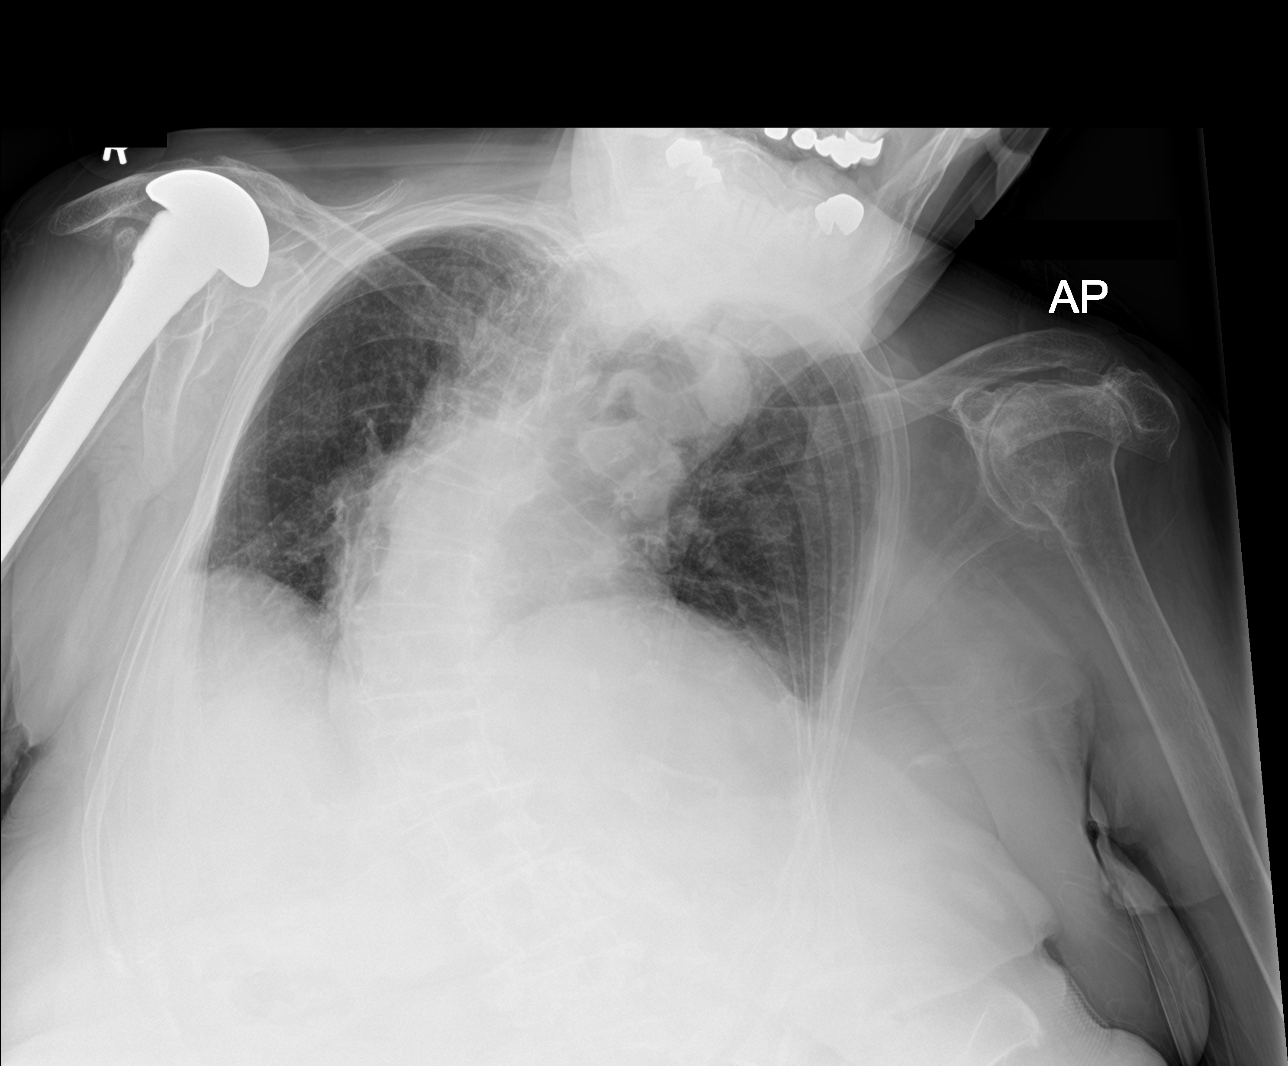
[im 2/2]
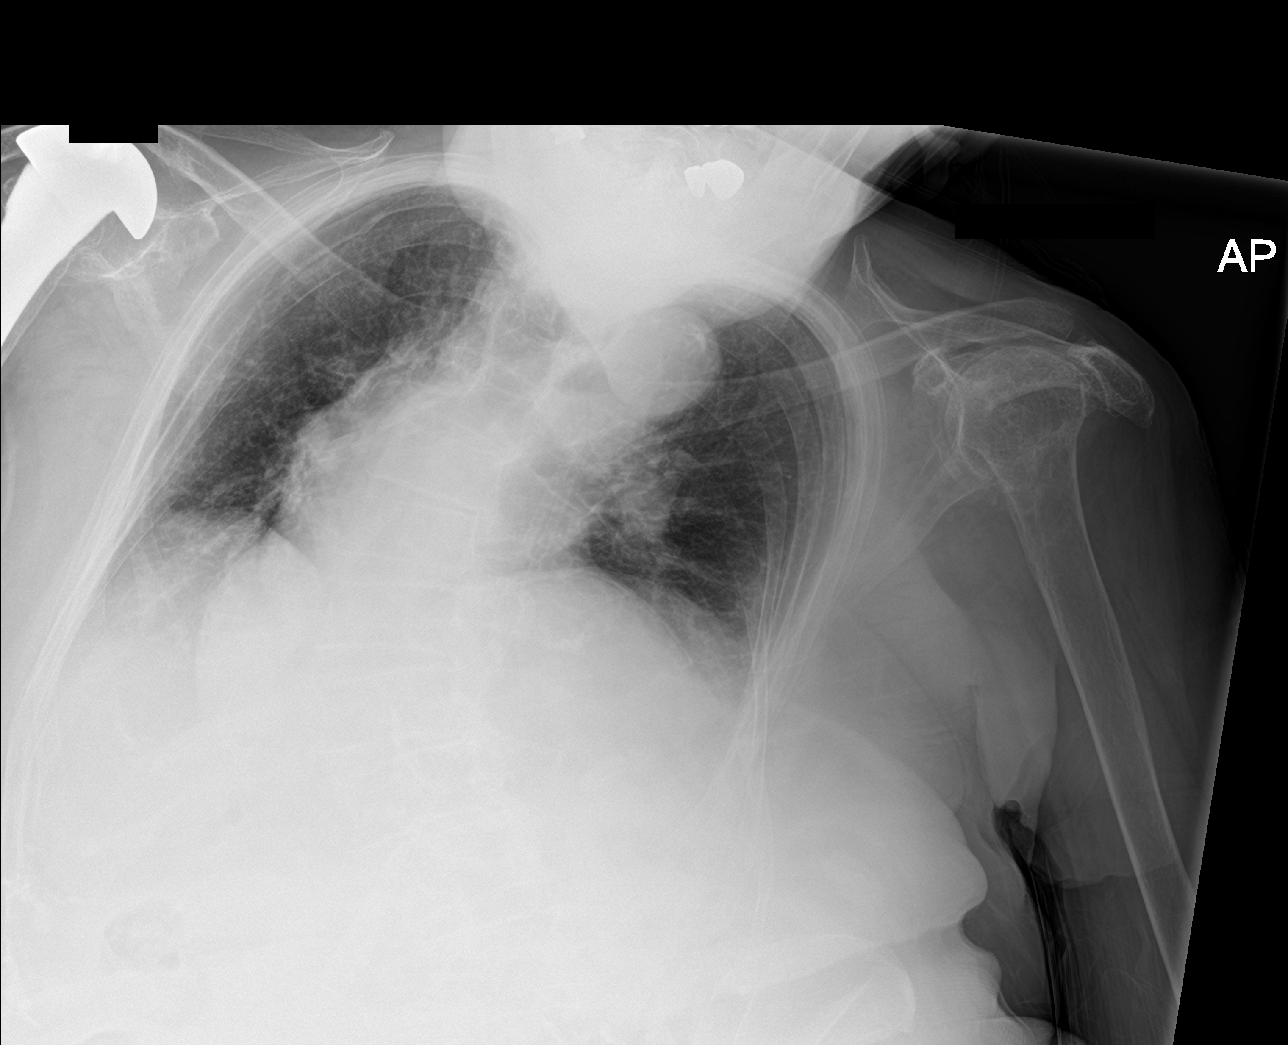

[3 of 3 positions shown; findings below may reference images not displayed]

FINDINGS: The heart size and mediastinal contours are within normal limits. No
pneumothorax or pleural effusion is noted. Lungs are clear. Status
post right shoulder arthroplasty. Severe degenerative changes seen
involving the left glenohumeral joint. Thoracic kyphoscoliosis is
noted.
IMPRESSION: No active cardiopulmonary disease.
